# Patient Record
Sex: Female | Born: 1940 | Race: White | Hispanic: No | Smoking: Former smoker
Health system: Southern US, Community
[De-identification: ages and names within clinical notes are randomized; demographics above are authoritative.]

## PROBLEM LIST (undated history)

## (undated) DIAGNOSIS — C189 Malignant neoplasm of colon, unspecified: Secondary | ICD-10-CM

## (undated) DIAGNOSIS — M797 Fibromyalgia: Secondary | ICD-10-CM

## (undated) DIAGNOSIS — R140 Abdominal distension (gaseous): Secondary | ICD-10-CM

## (undated) DIAGNOSIS — D649 Anemia, unspecified: Secondary | ICD-10-CM

## (undated) DIAGNOSIS — M549 Dorsalgia, unspecified: Secondary | ICD-10-CM

## (undated) DIAGNOSIS — K219 Gastro-esophageal reflux disease without esophagitis: Secondary | ICD-10-CM

## (undated) DIAGNOSIS — G43909 Migraine, unspecified, not intractable, without status migrainosus: Secondary | ICD-10-CM

## (undated) DIAGNOSIS — F419 Anxiety disorder, unspecified: Secondary | ICD-10-CM

## (undated) DIAGNOSIS — R42 Dizziness and giddiness: Secondary | ICD-10-CM

## (undated) DIAGNOSIS — J189 Pneumonia, unspecified organism: Secondary | ICD-10-CM

## (undated) DIAGNOSIS — I10 Essential (primary) hypertension: Secondary | ICD-10-CM

## (undated) DIAGNOSIS — H409 Unspecified glaucoma: Secondary | ICD-10-CM

## (undated) DIAGNOSIS — R12 Heartburn: Secondary | ICD-10-CM

## (undated) DIAGNOSIS — M1990 Unspecified osteoarthritis, unspecified site: Secondary | ICD-10-CM

## (undated) DIAGNOSIS — E119 Type 2 diabetes mellitus without complications: Secondary | ICD-10-CM

## (undated) DIAGNOSIS — Z87442 Personal history of urinary calculi: Secondary | ICD-10-CM

## (undated) DIAGNOSIS — R519 Headache, unspecified: Secondary | ICD-10-CM

## (undated) DIAGNOSIS — N289 Disorder of kidney and ureter, unspecified: Secondary | ICD-10-CM

## (undated) DIAGNOSIS — G459 Transient cerebral ischemic attack, unspecified: Secondary | ICD-10-CM

## (undated) DIAGNOSIS — K3184 Gastroparesis: Secondary | ICD-10-CM

## (undated) DIAGNOSIS — R110 Nausea: Secondary | ICD-10-CM

## (undated) DIAGNOSIS — K449 Diaphragmatic hernia without obstruction or gangrene: Secondary | ICD-10-CM

## (undated) DIAGNOSIS — Z9889 Other specified postprocedural states: Secondary | ICD-10-CM

## (undated) DIAGNOSIS — J45909 Unspecified asthma, uncomplicated: Secondary | ICD-10-CM

## (undated) DIAGNOSIS — R112 Nausea with vomiting, unspecified: Secondary | ICD-10-CM

## (undated) DIAGNOSIS — G8929 Other chronic pain: Secondary | ICD-10-CM

## (undated) DIAGNOSIS — E1142 Type 2 diabetes mellitus with diabetic polyneuropathy: Secondary | ICD-10-CM

## (undated) DIAGNOSIS — Z8489 Family history of other specified conditions: Secondary | ICD-10-CM

## (undated) DIAGNOSIS — Z9289 Personal history of other medical treatment: Secondary | ICD-10-CM

## (undated) DIAGNOSIS — R51 Headache: Secondary | ICD-10-CM

## (undated) DIAGNOSIS — M51369 Other intervertebral disc degeneration, lumbar region without mention of lumbar back pain or lower extremity pain: Secondary | ICD-10-CM

## (undated) DIAGNOSIS — M542 Cervicalgia: Secondary | ICD-10-CM

## (undated) DIAGNOSIS — K254 Chronic or unspecified gastric ulcer with hemorrhage: Secondary | ICD-10-CM

## (undated) DIAGNOSIS — M5136 Other intervertebral disc degeneration, lumbar region: Secondary | ICD-10-CM

## (undated) HISTORY — PX: VAGINAL HYSTERECTOMY: SUR661

## (undated) HISTORY — PX: ABDOMINAL ADHESION SURGERY: SHX90

## (undated) HISTORY — DX: Gastro-esophageal reflux disease without esophagitis: K21.9

## (undated) HISTORY — DX: Nausea: R11.0

## (undated) HISTORY — PX: ANTERIOR CERVICAL DISCECTOMY: SHX1160

## (undated) HISTORY — DX: Abdominal distension (gaseous): R14.0

## (undated) HISTORY — PX: TONSILLECTOMY: SUR1361

## (undated) HISTORY — DX: Anemia, unspecified: D64.9

## (undated) HISTORY — PX: COLON SURGERY: SHX602

## (undated) HISTORY — DX: Gastroparesis: K31.84

---

## 1969-02-07 HISTORY — PX: HIATAL HERNIA REPAIR: SHX195

## 1979-02-08 DIAGNOSIS — K254 Chronic or unspecified gastric ulcer with hemorrhage: Secondary | ICD-10-CM

## 1979-02-08 DIAGNOSIS — Z9289 Personal history of other medical treatment: Secondary | ICD-10-CM

## 1979-02-08 HISTORY — DX: Chronic or unspecified gastric ulcer with hemorrhage: K25.4

## 1979-02-08 HISTORY — DX: Personal history of other medical treatment: Z92.89

## 1979-02-08 HISTORY — PX: CHOLECYSTECTOMY: SHX55

## 1983-06-10 HISTORY — PX: POSTERIOR FUSION CERVICAL SPINE: SUR628

## 1999-08-16 ENCOUNTER — Ambulatory Visit (HOSPITAL_COMMUNITY): Admission: RE | Admit: 1999-08-16 | Discharge: 1999-08-16 | Payer: Self-pay | Admitting: Neurological Surgery

## 1999-08-30 ENCOUNTER — Ambulatory Visit (HOSPITAL_COMMUNITY): Admission: RE | Admit: 1999-08-30 | Discharge: 1999-08-30 | Payer: Self-pay | Admitting: Neurological Surgery

## 1999-08-30 ENCOUNTER — Encounter: Payer: Self-pay | Admitting: Neurological Surgery

## 1999-10-10 ENCOUNTER — Encounter: Admission: RE | Admit: 1999-10-10 | Discharge: 1999-12-23 | Payer: Self-pay | Admitting: Anesthesiology

## 2000-01-02 ENCOUNTER — Encounter: Admission: RE | Admit: 2000-01-02 | Discharge: 2000-02-20 | Payer: Self-pay | Admitting: Anesthesiology

## 2000-04-10 ENCOUNTER — Encounter: Admission: RE | Admit: 2000-04-10 | Discharge: 2000-07-09 | Payer: Self-pay | Admitting: Anesthesiology

## 2000-07-13 ENCOUNTER — Encounter: Admission: RE | Admit: 2000-07-13 | Discharge: 2000-10-11 | Payer: Self-pay | Admitting: Anesthesiology

## 2000-09-25 ENCOUNTER — Encounter: Payer: Self-pay | Admitting: Neurology

## 2000-09-25 ENCOUNTER — Ambulatory Visit (HOSPITAL_COMMUNITY): Admission: RE | Admit: 2000-09-25 | Discharge: 2000-09-25 | Payer: Self-pay | Admitting: Neurology

## 2000-11-14 ENCOUNTER — Emergency Department (HOSPITAL_COMMUNITY): Admission: EM | Admit: 2000-11-14 | Discharge: 2000-11-14 | Payer: Self-pay | Admitting: Internal Medicine

## 2000-11-14 ENCOUNTER — Encounter: Payer: Self-pay | Admitting: Internal Medicine

## 2000-12-28 ENCOUNTER — Ambulatory Visit (HOSPITAL_COMMUNITY): Admission: RE | Admit: 2000-12-28 | Discharge: 2000-12-28 | Payer: Self-pay | Admitting: Internal Medicine

## 2000-12-28 HISTORY — PX: COLONOSCOPY: SHX174

## 2001-04-15 ENCOUNTER — Ambulatory Visit (HOSPITAL_COMMUNITY): Admission: RE | Admit: 2001-04-15 | Discharge: 2001-04-15 | Payer: Self-pay | Admitting: Neurology

## 2001-04-15 ENCOUNTER — Encounter: Payer: Self-pay | Admitting: Neurology

## 2001-06-17 ENCOUNTER — Encounter: Admission: RE | Admit: 2001-06-17 | Discharge: 2001-09-15 | Payer: Self-pay | Admitting: Family Medicine

## 2001-07-14 ENCOUNTER — Ambulatory Visit (HOSPITAL_COMMUNITY): Admission: RE | Admit: 2001-07-14 | Discharge: 2001-07-14 | Payer: Self-pay | Admitting: Internal Medicine

## 2001-08-11 HISTORY — PX: UPPER GASTROINTESTINAL ENDOSCOPY: SHX188

## 2002-03-28 ENCOUNTER — Encounter: Payer: Self-pay | Admitting: Family Medicine

## 2002-03-28 ENCOUNTER — Ambulatory Visit (HOSPITAL_COMMUNITY): Admission: RE | Admit: 2002-03-28 | Discharge: 2002-03-28 | Payer: Self-pay | Admitting: Family Medicine

## 2003-02-09 ENCOUNTER — Emergency Department (HOSPITAL_COMMUNITY): Admission: EM | Admit: 2003-02-09 | Discharge: 2003-02-10 | Payer: Self-pay | Admitting: *Deleted

## 2003-02-10 ENCOUNTER — Encounter: Payer: Self-pay | Admitting: *Deleted

## 2003-02-10 ENCOUNTER — Emergency Department (HOSPITAL_COMMUNITY): Admission: EM | Admit: 2003-02-10 | Discharge: 2003-02-11 | Payer: Self-pay | Admitting: Emergency Medicine

## 2004-01-29 ENCOUNTER — Emergency Department (HOSPITAL_COMMUNITY): Admission: EM | Admit: 2004-01-29 | Discharge: 2004-01-29 | Payer: Self-pay | Admitting: Emergency Medicine

## 2004-11-20 ENCOUNTER — Ambulatory Visit (HOSPITAL_COMMUNITY): Admission: RE | Admit: 2004-11-20 | Discharge: 2004-11-20 | Payer: Self-pay | Admitting: Family Medicine

## 2005-02-01 ENCOUNTER — Emergency Department (HOSPITAL_COMMUNITY): Admission: EM | Admit: 2005-02-01 | Discharge: 2005-02-02 | Payer: Self-pay | Admitting: *Deleted

## 2005-02-05 ENCOUNTER — Ambulatory Visit (HOSPITAL_COMMUNITY): Admission: RE | Admit: 2005-02-05 | Discharge: 2005-02-05 | Payer: Self-pay | Admitting: Family Medicine

## 2005-05-31 ENCOUNTER — Emergency Department (HOSPITAL_COMMUNITY): Admission: EM | Admit: 2005-05-31 | Discharge: 2005-05-31 | Payer: Self-pay | Admitting: Emergency Medicine

## 2006-02-18 ENCOUNTER — Ambulatory Visit (HOSPITAL_COMMUNITY): Admission: RE | Admit: 2006-02-18 | Discharge: 2006-02-18 | Payer: Self-pay | Admitting: Family Medicine

## 2006-06-11 ENCOUNTER — Ambulatory Visit: Payer: Self-pay | Admitting: Internal Medicine

## 2007-01-18 ENCOUNTER — Ambulatory Visit (HOSPITAL_COMMUNITY): Admission: RE | Admit: 2007-01-18 | Discharge: 2007-01-18 | Payer: Self-pay | Admitting: Family Medicine

## 2007-01-21 ENCOUNTER — Ambulatory Visit (HOSPITAL_COMMUNITY): Admission: RE | Admit: 2007-01-21 | Discharge: 2007-01-21 | Payer: Self-pay | Admitting: Family Medicine

## 2007-01-26 ENCOUNTER — Ambulatory Visit (HOSPITAL_COMMUNITY): Admission: RE | Admit: 2007-01-26 | Discharge: 2007-01-26 | Payer: Self-pay | Admitting: Urology

## 2007-05-10 HISTORY — PX: COLONOSCOPY: SHX174

## 2007-05-10 HISTORY — PX: UPPER GASTROINTESTINAL ENDOSCOPY: SHX188

## 2007-06-28 HISTORY — PX: COLONOSCOPY: SHX174

## 2007-07-12 ENCOUNTER — Ambulatory Visit (HOSPITAL_COMMUNITY): Admission: RE | Admit: 2007-07-12 | Discharge: 2007-07-12 | Payer: Self-pay | Admitting: Family Medicine

## 2008-01-19 ENCOUNTER — Ambulatory Visit (HOSPITAL_COMMUNITY): Admission: RE | Admit: 2008-01-19 | Discharge: 2008-01-19 | Payer: Self-pay | Admitting: Family Medicine

## 2008-01-20 ENCOUNTER — Ambulatory Visit (HOSPITAL_COMMUNITY): Admission: RE | Admit: 2008-01-20 | Discharge: 2008-01-20 | Payer: Self-pay | Admitting: Family Medicine

## 2008-01-20 ENCOUNTER — Encounter: Payer: Self-pay | Admitting: Orthopedic Surgery

## 2008-02-07 ENCOUNTER — Ambulatory Visit: Payer: Self-pay | Admitting: Orthopedic Surgery

## 2008-02-07 DIAGNOSIS — M479 Spondylosis, unspecified: Secondary | ICD-10-CM | POA: Insufficient documentation

## 2008-02-07 DIAGNOSIS — M4800 Spinal stenosis, site unspecified: Secondary | ICD-10-CM | POA: Insufficient documentation

## 2008-02-07 DIAGNOSIS — Q762 Congenital spondylolisthesis: Secondary | ICD-10-CM | POA: Insufficient documentation

## 2008-02-08 ENCOUNTER — Encounter: Payer: Self-pay | Admitting: Orthopedic Surgery

## 2008-02-11 ENCOUNTER — Ambulatory Visit (HOSPITAL_COMMUNITY): Admission: RE | Admit: 2008-02-11 | Discharge: 2008-02-11 | Payer: Self-pay | Admitting: Orthopedic Surgery

## 2008-02-22 ENCOUNTER — Telehealth: Payer: Self-pay | Admitting: Orthopedic Surgery

## 2008-03-09 ENCOUNTER — Telehealth: Payer: Self-pay | Admitting: Orthopedic Surgery

## 2008-03-21 ENCOUNTER — Telehealth: Payer: Self-pay | Admitting: Orthopedic Surgery

## 2008-03-24 ENCOUNTER — Encounter: Payer: Self-pay | Admitting: Orthopedic Surgery

## 2008-03-29 ENCOUNTER — Encounter (HOSPITAL_COMMUNITY): Admission: RE | Admit: 2008-03-29 | Discharge: 2008-04-21 | Payer: Self-pay | Admitting: Neurosurgery

## 2008-05-01 ENCOUNTER — Ambulatory Visit (HOSPITAL_COMMUNITY): Admission: RE | Admit: 2008-05-01 | Discharge: 2008-05-01 | Payer: Self-pay | Admitting: Neurosurgery

## 2008-06-09 HISTORY — PX: HEMICOLECTOMY: SHX854

## 2008-06-26 ENCOUNTER — Encounter (HOSPITAL_COMMUNITY): Admission: RE | Admit: 2008-06-26 | Discharge: 2008-07-26 | Payer: Self-pay | Admitting: Neurosurgery

## 2008-06-26 ENCOUNTER — Ambulatory Visit (HOSPITAL_COMMUNITY): Admission: RE | Admit: 2008-06-26 | Discharge: 2008-06-26 | Payer: Self-pay | Admitting: Urology

## 2008-07-25 ENCOUNTER — Encounter: Payer: Self-pay | Admitting: Orthopedic Surgery

## 2008-07-28 HISTORY — PX: COLONOSCOPY: SHX174

## 2008-08-11 ENCOUNTER — Ambulatory Visit (HOSPITAL_COMMUNITY): Admission: RE | Admit: 2008-08-11 | Discharge: 2008-08-11 | Payer: Self-pay | Admitting: General Surgery

## 2008-08-17 ENCOUNTER — Ambulatory Visit: Payer: Self-pay | Admitting: Cardiology

## 2008-08-17 ENCOUNTER — Ambulatory Visit (HOSPITAL_COMMUNITY): Admission: RE | Admit: 2008-08-17 | Discharge: 2008-08-17 | Payer: Self-pay | Admitting: Family Medicine

## 2008-08-17 ENCOUNTER — Encounter (INDEPENDENT_AMBULATORY_CARE_PROVIDER_SITE_OTHER): Payer: Self-pay | Admitting: Family Medicine

## 2008-08-18 ENCOUNTER — Inpatient Hospital Stay (HOSPITAL_COMMUNITY): Admission: RE | Admit: 2008-08-18 | Discharge: 2008-08-22 | Payer: Self-pay | Admitting: General Surgery

## 2008-08-18 ENCOUNTER — Encounter (INDEPENDENT_AMBULATORY_CARE_PROVIDER_SITE_OTHER): Payer: Self-pay | Admitting: General Surgery

## 2008-11-10 ENCOUNTER — Inpatient Hospital Stay (HOSPITAL_COMMUNITY): Admission: RE | Admit: 2008-11-10 | Discharge: 2008-11-11 | Payer: Self-pay | Admitting: Neurosurgery

## 2009-01-09 ENCOUNTER — Encounter: Admission: RE | Admit: 2009-01-09 | Discharge: 2009-01-09 | Payer: Self-pay | Admitting: Neurosurgery

## 2009-04-19 ENCOUNTER — Encounter: Admission: RE | Admit: 2009-04-19 | Discharge: 2009-04-19 | Payer: Self-pay | Admitting: Neurosurgery

## 2009-05-08 ENCOUNTER — Encounter: Admission: RE | Admit: 2009-05-08 | Discharge: 2009-05-08 | Payer: Self-pay | Admitting: Neurosurgery

## 2009-09-25 ENCOUNTER — Ambulatory Visit (HOSPITAL_COMMUNITY): Admission: RE | Admit: 2009-09-25 | Discharge: 2009-09-25 | Payer: Self-pay | Admitting: Family Medicine

## 2009-11-23 HISTORY — PX: COLONOSCOPY: SHX174

## 2009-11-23 HISTORY — PX: UPPER GASTROINTESTINAL ENDOSCOPY: SHX188

## 2010-03-05 ENCOUNTER — Emergency Department (HOSPITAL_COMMUNITY): Admission: EM | Admit: 2010-03-05 | Discharge: 2010-03-05 | Payer: Self-pay | Admitting: Emergency Medicine

## 2010-07-01 LAB — CBC AND DIFFERENTIAL
HCT: 39 % (ref 36–46)
Hemoglobin: 11.9 g/dL — AB (ref 12.0–16.0)
Platelets: 221 10*3/uL (ref 150–399)

## 2010-07-01 LAB — BASIC METABOLIC PANEL
Glucose: 107 mg/dL
Potassium: 4.2 mmol/L (ref 3.4–5.3)
Sodium: 139 mmol/L (ref 137–147)

## 2010-07-01 LAB — HEPATIC FUNCTION PANEL
ALT: 10 U/L (ref 7–35)
AST: 15 U/L (ref 13–35)

## 2010-07-09 NOTE — Letter (Signed)
Summary: External Correspondence  External Correspondence   Imported By: Elvera Maria 08/04/2008 09:12:07  _____________________________________________________________________  External Attachment:    Type:   Image     Comment:   [facility]

## 2010-07-09 NOTE — Progress Notes (Signed)
Summary: Neurosurgeon referral.  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

## 2010-07-09 NOTE — Medication Information (Signed)
Summary: RX Folder  RX Folder   Imported By: Jacklynn Ganong 02/08/2008 11:45:56  _____________________________________________________________________  External Attachment:    Type:   Image     Comment:   updated meds

## 2010-07-09 NOTE — Letter (Signed)
Summary: Historic Patient File  Historic Patient File   Imported By: Jacklynn Ganong 02/08/2008 11:45:03  _____________________________________________________________________  External Attachment:    Type:   Image     Comment:   history form

## 2010-07-09 NOTE — Letter (Signed)
Summary: Consultant Letter  All     ,     Phone:   Fax:     02/08/2008  Dear Dr.Knowlton:  I am writing to report my evaluation of [name], a 70 Years Old Female on whom I consulted 02/07/2008 at . When I saw the patient the Chief Complaint was "right hip pain".     The patient presents with right-sided "hip pain" which is radiating to her right knee down the lateral portion of her right thigh. She denies any injury. She has had 2 episodes of lower back pain which were treated with prednisone which she says did not help. She takes Tylox for neck pain which is chronic and this relieves part of her hip pain. The pain also radiates up into her back. It is associated with numbness. She denies MRI evaluation or previous surgery at this time.  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 My assessment at that time was as follows:  She has a significant spinal malalignment and spondylolisthesis. The spondylosis and spinal stenosis most likely accounts for her right leg pain.  We are referring her to a neurosurgeon for further evaluation.  I will send copies of the results of the above investigations as they become available to your office if you wish. The most recent office note and chart summary are attached. Thank you for the opportunity to participate in the care of [name].  Yours truly,   Fuller Canada MD

## 2010-07-09 NOTE — Progress Notes (Signed)
Summary: New neurosurgeon referral.  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 I sent the referral on 03-09-08.

## 2010-07-09 NOTE — Progress Notes (Signed)
Summary: Dr. Wynetta Emery appointment.  Phone Note Outgoing Call   Call placed by: Waldon Reining,  March 21, 2008 9:33 AM Call placed to: Patient Action Taken: Phone Call Completed, Appt scheduled Summary of Call: I called to give patient her appointment with Dr. Wynetta Emery on 03-24-08 at 1:45 at the [facility] office. I left a message for the patient.

## 2010-07-09 NOTE — Consult Note (Signed)
Summary: Consult Report [facility] Dr Wynetta Emery  Consult Report [facility] Dr Wynetta Emery   Imported By: Cammie Sickle 04/17/2008 17:33:49  _____________________________________________________________________  External Attachment:    Type:   Image     Comment:   External Document

## 2010-07-09 NOTE — Assessment & Plan Note (Signed)
Summary: RT HIP PAIN,REF [facility]LTON/XR+MRI @[facility] /?NEW XRAY?MEDICARE/CAF   Vital Signs:  Patient Profile:   70 Years Old Female Weight:      141 pounds Pulse rate:   72 / minute Resp:     16 per minute  Vitals Entered By: Fuller Canada MD (February 07, 2008 10:33 AM)                 Chief Complaint:  right hip pain.  History of Present Illness: I saw [name] in the office today for an initial visit.  She is a 70 years old woman with the complaint of:  right hip pain, consult Knowlton.  The patient presents with right-sided "hip pain" which is radiating to her right knee down the lateral portion of her right thigh. She denies any injury. She has had 2 episodes of lower back pain which were treated with prednisone which she says did not help. She takes Tylox for neck pain which is chronic and this relieves part of her hip pain. The pain also radiates up into her back. It is associated with numbness. She denies MRI evaluation her previous surgery at this time.          Updated Prior Medication List: medications have been scanned due to the number. Current Allergies (reviewed today): ! MORPHINE ! CODEINE ! PENICILLIN  Past Medical History:    diabetes    glaucoma    reflux    cataracts    htn  Past Surgical History:    gallbladder    hernia    hysterectomy    neck/was broken   Family History:    Family History of Diabetes    Family History Coronary Heart Disease female < 13    Family History of Arthritis    Hx, family, asthma    Hx, family, kidney disease NEC  Social History:    Patient is widowed.     retired   Risk Factors:  Tobacco use:  never Caffeine use:  1 drinks per day Alcohol use:  no  Family History Risk Factors:    Family History of MI in males < 57 years old:  yes   Review of Systems  General      Denies weight loss, weight gain, fever, chills, and fatigue.  Cardiac      Denies chest pain, angina, heart attack, heart failure,  poor circulation, blood clots, and phlebitis.  Resp      Denies short of breath, difficulty breathing, COPD, cough, and pneumonia.  GI      Complains of reflux.      Denies nausea, vomiting, diarrhea, constipation, difficulty swallowing, ulcers, and GERD.  GU      Denies kidney failure, kidney transplant, kidney stones, burning, poor stream, testicular cancer, blood in urine, and .  Neuro      Complains of headache, migraines, and tremor.      Denies dizziness, numbness, weakness, and unsteady walking.      tremor around mouth  MS      Complains of joint pain, rheumatoid arthritis, joint swelling, and osteoporosis.      Denies gout, bone cancer, and .  Endo      Complains of diabetes.      Denies thyroid disease and goiter.  Psych      Complains of depression and anxiety.      Denies mood swings, panic attack, bipolar, and schizophrenia.  Derm      Denies eczema, cancer, and itching.  EENT      Complains of poor vision, cataracts, glaucoma, poor hearing, vertigo, ears ringing, and toothaches.      Denies sinusitis, hoarseness, and bleeding gums.  Immunology      Complains of seasonal allergies and sinus problems.      Denies allergic to bee stings.  Lymphatic      Denies lymph node cancer and lymph edema.   Physical Exam  Constitutional: vital signs see recorded values. General: normal development, nutrition, and grooming. No deformity. Body Habitus is endomorphic CDV: Observation and palpation was normal  Lymph: palpation of the lymph nodes were normal Skin: inspection and palpation of the skin revealed no abnormalities  Neuro: coordination: normal              DTR's normal              Sensation was normal  Psyche: Alert and oriented x 3. Mood was normal.  Affect: normal  MSK: Gait: normal   Lumbar: Mild tenderness right paraspinal region, right gluteal region. Coronal and sagittal alignment appear normal to gross inspection.  Upper extremities: Inspection  was normal. Assessment of range of motion, strength, stability were normal.  Lower extremities: Inspection was normal. Assessment of range of motion and strength stability were normal in the hips, knees and ankles.  The straight leg raise maneuver produces no symptoms.      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 There is grade 3 spondylolisthesis at L5-S1 with complete obliteration of the L5-S1 disc space, there are supple degenerative changes at L2 and 3 and L3 and 4. There is coronal plane malalignment as well.  Impression spondylolisthesis with degenerative disc disease consistent with associated spondylosis and most likely spinal stenosis.  The x-rays from the hospital included MRI of the hip and hip x-rays which show mild degenerative changes nothing requiring any further treatment especially without groin pain or anterior thigh pain.  I recommended that she have neurosurgical evaluation to treat this.    Patient Instructions: 1)  Neurosurgeon referral.   ]

## 2010-07-23 ENCOUNTER — Ambulatory Visit (INDEPENDENT_AMBULATORY_CARE_PROVIDER_SITE_OTHER): Payer: Medicare Other | Admitting: Internal Medicine

## 2010-07-23 DIAGNOSIS — K3184 Gastroparesis: Secondary | ICD-10-CM

## 2010-07-23 DIAGNOSIS — R112 Nausea with vomiting, unspecified: Secondary | ICD-10-CM

## 2010-07-28 NOTE — Consult Note (Signed)
[name]               ACCOUNT NO.:  [id number]  MEDICAL RECORD NO.:  [id number]          PATIENT TYPE:  OPT  LOCATION: [place]                   FACILITY:  [facility]  PHYSICIAN:  Lionel December, M.D.    DATE OF BIRTH:  1941-06-01  DATE OF CONSULTATION:  07/23/2010 DATE OF DISCHARGE:  03/05/2010                                OFFICE VISIT.   PRESENTING COMPLAINT:  Nausea, bloating, regurgitation and heartburn.  The patient with known diabetic gastroparesis.  Last visit 6 months ago in [place].  SUBJECTIVE:  [name] is a 70 year old Caucasian female patient of Dr. Gareth Morgan, who is here for scheduled visit.  She states she is not feeling well.  She has multiple episodes of nausea every day, but has not had any vomiting recently.  She also complains of regurgitation after most of the meals and in between.  She is also having multiple episodes of heartburn.  In addition to her usual medication, she is also taking Mylanta 4 times a day.  Her appetite is fair.  However, she has not lost any weight recently.  She says her bowels move regularly.  She denies melena or rectal bleeding.  She has multiple other complaints. She complains of constant pain in her head, face, neck and shoulders. She also states that she brush the teeth all the time.  She feels nervous.  She states she has had nervousness off and on all of her life and not necessarily any worse, since she was on metoclopramide.  She states that she is on a bland diet.  However, this morning, she had egg biscuit from [employer] and last night she had a cheeseburger.  She does not do any exercise on regular basis, but she states that she is busy with 2 of her great grand children ages 36 months and 75-1/2 or 58 years old.  She denies dysphagia, odynophagia or chronic cough or hoarseness. She also denies fever, chills.  CURRENT MEDICATIONS: 1. Metformin 1 g p.o. b.i.d. 2. Lipitor 10 mg p.o. daily. 3. Evista 60 mg  daily. 4. Metoclopramide 20 mg before breakfast and lunch and 15 mg before     evening meal and at bedtime. 5. Lansoprazole 30 mg p.o. b.i.d. 6. Cymbalta 60 mg daily. 7. Enalapril 5 mg daily. 8. Plavix 75 mg daily. 9. Temazepam 30 mg p.o. nightly. 10.Topamax 100 mg p.o. nightly. 11.Baclofen 10 mg p.o. b.i.d. 12.Oxycodone/APAP 10/325 q.i.d. p.r.n. 13.Flaxseed 1 g daily. 14.Vitamin D 1000 units daily. 15.Calcium and vitamin D daily. 16.Latanoprost eyedrops 0.005% each eye at bedtime. 17.Mylanta 2 tablespoonful q.i.d.  Her GI history is significant for diabetic gastroparesis.  She also has history of iron-deficiency anemia possibly related to impaired iron absorption due to chronic acid suppression.  She has history of colonic polyps and carcinoma.  She had right hemicolectomy by Dr. Leticia Penna in March 2010.  She had an EGD and colonoscopy at [facility] in June 2012.  She had small sliding hiatal hernia, otherwise normal EGD and she had a pancolonic diverticulosis and a patent ileocolonic anastomosis and hemorrhoids.  For other medical problems, please refer to my last note from [place].  ALLERGIES:  To MORPHINE,  PENTASA and CODEINE.  OBJECTIVE:  VITAL SIGNS:  Weight 140, 4-1/2 pounds, she is 61 inches tall, pulse 70 per minute, blood pressure 118/74 and temp is 97.4. GENERAL:  She appears anxious, but she does not have any abnormal movement to her tongue or lips. EYES:  Conjunctivae are pink.  Sclerae are nonicteric. MOUTH:  Oropharyngeal mucosa is normal. NECK:  No neck masses are noted. LUNGS:  Clear to auscultation. ABDOMEN:  Symmetrical.  Bowel sounds are normal.  No bruits noted.  On palpation, soft abdomen with mild midepigastric tenderness, no organomegaly or masses noted.  ASSESSMENT: 1. Myanna has persistent symptoms of gastroesophageal reflux disease and     gastroparesis.  She is on fairly high dose of metoclopramide total     dose 70 mg per day.  Luckily, she is not having  any side effects.     However, it does not appear to be helping her symptoms.  She     definitely is not very compliant with a diet given what she had     this morning and last evening.  She did have solid phase gastric     emptying study last year at [facility] while on therapy, but I do not have     results of those.  She already has a gallbladder removed.  She     states she had lab studies by Dr. Sudie Bailey recently, but does not     know the results.  We need to get the results before any decisions     made. 2. She may be a candidate for therapy with erythromycin.  She has     failed domperidone in the past.  Other option would be referral to     [facility] to see if she would be a candidate for gastric     electrical stimulation. 3. It is quite possible that her symptoms are just due to chronic     dyspepsia given all the medication that she is on.  PLAN: 1. The patient advised to drop evening dose of metoclopramide, so she     will be taking 55 mg per day. 2. The patient was advised to cut back on use of Mylanta p.r.n. 3. She needs to be on bland diet.  We will provide her with written     instructions.  We will request copy of recent blood work from Dr. Michelle Nasuti office and her records from [place] before further recommendations are made.     Lionel December, M.D.     NR/MEDQ  D:  07/23/2010  T:  07/24/2010  Job:  [id number]  cc:   Mila Homer. Sudie Bailey, M.D. Fax: [phone]  Electronically Signed by Lionel December M.D. on 07/28/2010 01:56:53 PM

## 2010-08-11 ENCOUNTER — Ambulatory Visit: Payer: PRIVATE HEALTH INSURANCE | Admitting: Family Medicine

## 2010-08-11 ENCOUNTER — Encounter: Payer: Self-pay | Admitting: Family Medicine

## 2010-08-11 DIAGNOSIS — R5381 Other malaise: Secondary | ICD-10-CM

## 2010-08-11 DIAGNOSIS — J309 Allergic rhinitis, unspecified: Secondary | ICD-10-CM

## 2010-08-11 DIAGNOSIS — R5383 Other fatigue: Secondary | ICD-10-CM

## 2010-08-11 DIAGNOSIS — J45909 Unspecified asthma, uncomplicated: Secondary | ICD-10-CM

## 2010-08-11 DIAGNOSIS — J209 Acute bronchitis, unspecified: Secondary | ICD-10-CM

## 2010-08-20 NOTE — Assessment & Plan Note (Signed)
Summary: cold/flu like symptoms/jbb   Vital Signs:  Patient Profile:   70 Years Old Female CC:      Flu like symptoms x 1 week Height:     60.75 inches Weight:      141 pounds Temp:     99.1 degrees F oral Pulse rate:   71 / minute Pulse rhythm:   regular Resp:     16 per minute BP sitting:   152 / 84  (right arm) Cuff size:   regular  Pt. in pain?   no  Vitals Entered By: Providence Crosby LPN (August 11, 6043 2:20 PM)                   Current Allergies (reviewed today): ! MORPHINE ! CODEINE ! PENICILLINHistory of Present Illness History from: patient Chief Complaint: Flu like symptoms x 1 week History of Present Illness: chills x 1 week ear pain: frequent runny nose: She is having chest congestion and congestion and coughing and sneezing.   She is having itching watery eyes and rubbing eyes frequently.  She reports history of headaches and sinus pressure;  She does have a history of pollen allergies but hasn't been taking her benadryl medication recently.  She says that she is taking Nasonex Nasal Spray that was recently prescribed to her by her PCP.  She is scheduled to see her PCP next week for a follow up appointment.  She is coughing and wheezing but mostly at night.  No SOB reported but chest discomfort A/W coughing.  No n/v/d.   REVIEW OF SYSTEMS Constitutional Symptoms       Complains of fever, chills, and fatigue.     Denies night sweats, weight loss, and weight gain.  Eyes       Denies change in vision, eye pain, eye discharge, glasses, contact lenses, and eye surgery. Ear/Nose/Throat/Mouth       Complains of ear pain, frequent runny nose, sinus problems, and hoarseness.      Denies hearing loss/aids, change in hearing, ear discharge, dizziness, frequent nose bleeds, sore throat, and tooth pain or bleeding.  Respiratory       Complains of dry cough.      Denies productive cough, wheezing, shortness of breath, asthma, bronchitis, and emphysema/COPD.  Cardiovascular   Complains of chest pain and tires easily with exhertion.      Denies murmurs.    Gastrointestinal       Denies stomach pain, nausea/vomiting, diarrhea, constipation, blood in bowel movements, and indigestion. Genitourniary       Denies painful urination, kidney stones, and loss of urinary control. Neurological       Complains of headaches.      Denies paralysis, seizures, and fainting/blackouts. Musculoskeletal       Denies muscle pain, joint pain, joint stiffness, decreased range of motion, redness, swelling, muscle weakness, and gout.  Skin       Denies bruising, unusual mles/lumps or sores, and hair/skin or nail changes.  Psych       Denies mood changes, temper/anger issues, anxiety/stress, speech problems, depression, and sleep problems.  Past History:  Family History: Last updated: 02/07/2008 Family History of Diabetes Family History Coronary Heart Disease female < 2 Family History of Arthritis Hx, family, asthma Hx, family, kidney disease NEC  Social History: Last updated: 08/11/2010 Patient is widowed.  retired No Tobacco ETOH or rec drugs reported;   Risk Factors: Caffeine Use: 1 (02/07/2008)  Risk Factors: Smoking Status: never (08/11/2010)  Past Medical History: diabetes mellitus, type 2 glaucoma GERD GAD Chronic Insomnia cataracts HTN  Past Surgical History: Reviewed history from 02/07/2008 and no changes required. gallbladder hernia hysterectomy neck/was broken  Family History: Reviewed history from 02/07/2008 and no changes required. Family History of Diabetes Family History Coronary Heart Disease female < 67 Family History of Arthritis Hx, family, asthma Hx, family, kidney disease NEC  Social History: Patient is widowed.  retired No Tobacco ETOH or rec drugs reported;  Physical Exam General appearance: well developed, well nourished, no acute distress Head: normocephalic, atraumatic Eyes: conjunctivae and lids normal Pupils: equal,  round, reactive to light Ears: normal, no lesions or deformities Nasal: pale, boggy, swollen nasal turbinates, clear rhinorrhea Oral/Pharynx: tongue normal, posterior pharynx without erythema or exudate Neck: neck supple,  trachea midline, no masses Chest/Lungs: anterior upper airway congestion noises; no rales, wheezes, or rhonchi bilateral, breath sounds equal without effort Heart: regular rate and  rhythm, no murmur Abdomen: soft, non-tender without obvious organomegaly Extremities: normal extremities Neurological: normal gait Skin: no obvious rashes or lesions MSE: oriented to time, place, and person Assessment New Problems: BRONCHITIS, ALLERGIC (ICD-493.90) FATIGUE, ACUTE (ICD-780.79) POSTNASAL DRIP SYNDROME, 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  The patient verbalized clear understanding.  The patient was given instructions to return if symptoms don't improve, worsen or new changes develop.  If it is not during clinic hours and the patient cannot get back to this clinic then the patient was told to seek medical care at an available urgent care or emergency department.  The patient verbalized understanding.   Demonstrates willingness to comply.  Plan New Medications/Changes: VENTOLIN HFA 108 (90 BASE) MCG/ACT AERS (ALBUTEROL SULFATE) 2 puffs every 4 hours as needed cough, wheezing, SOB  #1 x 0, 08/11/2010, Wanda Combs LPN CETIRIZINE HCL 10 MG TABS (CETIRIZINE HCL) take 1 by mouth daily for allergies  #30 x 1, 08/11/2010, Providence Crosby LPN  Follow Up: Follow up in 2-3 days if no improvement, Follow up on an as needed basis, Follow up with Primary Physician  The patient and/or caregiver has been counseled thoroughly with regard to medications prescribed including dosage, schedule, interactions, rationale for use, and possible side  effects and they verbalize understanding.  Diagnoses and expected course of recovery discussed and will return if not improved as expected or if the condition worsens. Patient and/or caregiver verbalized understanding.  Prescriptions: VENTOLIN HFA 108 (90 BASE) MCG/ACT AERS (ALBUTEROL SULFATE) 2 puffs every 4 hours as needed cough, wheezing, SOB  #1 x 0   Entered by:   Providence Crosby LPN   Authorized by:   Standley Dakins MD   Signed by:   Providence Crosby LPN on 14/78/2956   Method used:   Electronically to        [facility] [address] (retail)       [address], [address]       [place], [place]  [address]       Ph: [phone]       Fax: [phone]   RxID:   [id number] CETIRIZINE HCL 10 MG TABS (CETIRIZINE HCL) take 1 by mouth daily for allergies  #30 x 1   Entered by:   Providence Crosby LPN   Authorized by:   Standley Dakins MD   Signed by:   Providence Crosby LPN on  08/11/2010   Method used:   Electronically to        [facility] [address] (retail)       [address]       [place], [place]  [address]       Ph: [phone]       Fax: [phone]   RxID:   [id number]   Patient Instructions: 1)  Go to the pharmacy and pick up your prescription (s).  It may take up to 30 mins for electronic prescriptions to be delivered to the pharmacy.  Please call if your pharmacy has not received your prescriptions after 30 minutes.   2)  Return or go to the ER if no improvement or symptoms getting worse.   3)  Start your new allergy medications.    4)  See your primary care physician next week.  5)  The patient was informed that there is no on-call provider or services available at this clinic during off-hours (when the clinic is closed).  If the patient developed a problem or concern that required immediate attention, the patient was advised to go the the nearest available urgent care or emergency department for medical care.   The patient verbalized understanding.    6)  Check your blood sugars regularly. If your readings are usually above : 200 or below 70 you should contact our office. 7)  It is important that your Diabetic A1c level is checked every 3 months. 8)  Check your Blood Pressure regularly. If it is above: 140/90 you should make an appointment.    Preventive Screening-Counseling & Management  Alcohol-Tobacco     Smoking Status: never   Appended Document: cold/flu like symptoms/jbb Pt reported that she had a flu vaccine this season.  Flu Test - negative.  Rodney Langton, MD, CDE, [employer]

## 2010-08-22 LAB — CREATININE, SERUM
Creatinine, Ser: 0.66 mg/dL (ref 0.4–1.2)
GFR calc Af Amer: >60 mL/min (ref >60)
GFR calc non Af Amer: >60 mL/min (ref >60)

## 2010-09-05 NOTE — Letter (Signed)
Summary: history form  history form   Imported By: Eugenio Hoes 08/26/2010 14:46:50  _____________________________________________________________________  External Attachment:    Type:   Image     Comment:   External Document

## 2010-09-05 NOTE — Medication Information (Signed)
Summary: medication  medication   Imported By: Eugenio Hoes 08/26/2010 14:48:42  _____________________________________________________________________  External Attachment:    Type:   Image     Comment:   External Document

## 2010-09-16 LAB — GLUCOSE, CAPILLARY
Glucose-Capillary: 114 mg/dL — ABNORMAL HIGH (ref 70–99)
Glucose-Capillary: 148 mg/dL — ABNORMAL HIGH (ref 70–99)
Glucose-Capillary: 148 mg/dL — ABNORMAL HIGH (ref 70–99)
Glucose-Capillary: 162 mg/dL — ABNORMAL HIGH (ref 70–99)

## 2010-09-16 LAB — CBC
HCT: 32.9 % — ABNORMAL LOW (ref 36.0–46.0)
Hemoglobin: 10.7 g/dL — ABNORMAL LOW (ref 12.0–15.0)
MCHC: 32.4 g/dL (ref 30.0–36.0)
MCV: 86.5 fL (ref 78.0–100.0)
Platelets: 200 10*3/uL (ref 150–400)
RBC: 3.81 MIL/uL — ABNORMAL LOW (ref 3.87–5.11)
RDW: 14.8 % (ref 11.5–15.5)
WBC: 7.2 10*3/uL (ref 4.0–10.5)

## 2010-09-16 LAB — BASIC METABOLIC PANEL
BUN: 11 mg/dL (ref 6–23)
CO2: 24 mEq/L (ref 19–32)
Calcium: 9.6 mg/dL (ref 8.4–10.5)
Chloride: 110 mEq/L (ref 96–112)
Creatinine, Ser: 0.67 mg/dL (ref 0.4–1.2)
GFR calc Af Amer: >60 mL/min (ref >60)
GFR calc non Af Amer: >60 mL/min (ref >60)
Glucose, Bld: 200 mg/dL — ABNORMAL HIGH (ref 70–99)
Potassium: 3.8 mEq/L (ref 3.5–5.1)
Sodium: 142 mEq/L (ref 135–145)

## 2010-09-16 LAB — APTT: aPTT: 28 seconds (ref 24–37)

## 2010-09-16 LAB — PROTIME-INR
INR: 0.9 (ref 0.00–1.49)
Prothrombin Time: 12 seconds (ref 11.6–15.2)

## 2010-09-19 LAB — BASIC METABOLIC PANEL
BUN: 9 mg/dL (ref 6–23)
CO2: 22 mEq/L (ref 19–32)
Calcium: 8.9 mg/dL (ref 8.4–10.5)
Chloride: 103 mEq/L (ref 96–112)
Creatinine, Ser: 0.76 mg/dL (ref 0.4–1.2)
GFR calc Af Amer: >60 mL/min (ref >60)
GFR calc non Af Amer: >60 mL/min (ref >60)
Glucose, Bld: 255 mg/dL — ABNORMAL HIGH (ref 70–99)
Potassium: 3.7 mEq/L (ref 3.5–5.1)
Sodium: 136 mEq/L (ref 135–145)

## 2010-09-19 LAB — COMPREHENSIVE METABOLIC PANEL
ALT: 17 U/L (ref 0–35)
ALT: 16 U/L (ref 0–35)
ALT: 16 U/L (ref 0–35)
ALT: 17 U/L (ref 0–35)
AST: 19 U/L (ref 0–37)
AST: 16 U/L (ref 0–37)
AST: 17 U/L (ref 0–37)
AST: 19 U/L (ref 0–37)
Albumin: 2.7 g/dL — ABNORMAL LOW (ref 3.5–5.2)
Albumin: 2.4 g/dL — ABNORMAL LOW (ref 3.5–5.2)
Albumin: 2.6 g/dL — ABNORMAL LOW (ref 3.5–5.2)
Albumin: 2.7 g/dL — ABNORMAL LOW (ref 3.5–5.2)
Alkaline Phosphatase: 46 U/L (ref 39–117)
Alkaline Phosphatase: 35 U/L — ABNORMAL LOW (ref 39–117)
Alkaline Phosphatase: 43 U/L (ref 39–117)
Alkaline Phosphatase: 49 U/L (ref 39–117)
BUN: 4 mg/dL — ABNORMAL LOW (ref 6–23)
BUN: 2 mg/dL — ABNORMAL LOW (ref 6–23)
BUN: 3 mg/dL — ABNORMAL LOW (ref 6–23)
BUN: 6 mg/dL (ref 6–23)
CO2: 26 mEq/L (ref 19–32)
CO2: 25 mEq/L (ref 19–32)
CO2: 28 mEq/L (ref 19–32)
CO2: 29 mEq/L (ref 19–32)
Calcium: 8.8 mg/dL (ref 8.4–10.5)
Calcium: 8.2 mg/dL — ABNORMAL LOW (ref 8.4–10.5)
Calcium: 8.4 mg/dL (ref 8.4–10.5)
Calcium: 8.9 mg/dL (ref 8.4–10.5)
Chloride: 111 mEq/L (ref 96–112)
Chloride: 106 mEq/L (ref 96–112)
Chloride: 106 mEq/L (ref 96–112)
Chloride: 111 mEq/L (ref 96–112)
Creatinine, Ser: 0.58 mg/dL (ref 0.4–1.2)
Creatinine, Ser: 0.51 mg/dL (ref 0.4–1.2)
Creatinine, Ser: 0.58 mg/dL (ref 0.4–1.2)
Creatinine, Ser: 0.71 mg/dL (ref 0.4–1.2)
GFR calc Af Amer: >60 mL/min (ref >60)
GFR calc Af Amer: >60 mL/min (ref >60)
GFR calc Af Amer: >60 mL/min (ref >60)
GFR calc Af Amer: >60 mL/min (ref >60)
GFR calc non Af Amer: >60 mL/min (ref >60)
GFR calc non Af Amer: >60 mL/min (ref >60)
GFR calc non Af Amer: >60 mL/min (ref >60)
GFR calc non Af Amer: >60 mL/min (ref >60)
Glucose, Bld: 146 mg/dL — ABNORMAL HIGH (ref 70–99)
Glucose, Bld: 147 mg/dL — ABNORMAL HIGH (ref 70–99)
Glucose, Bld: 169 mg/dL — ABNORMAL HIGH (ref 70–99)
Glucose, Bld: 188 mg/dL — ABNORMAL HIGH (ref 70–99)
Potassium: 3.2 mEq/L — ABNORMAL LOW (ref 3.5–5.1)
Potassium: 3.4 mEq/L — ABNORMAL LOW (ref 3.5–5.1)
Potassium: 3.8 mEq/L (ref 3.5–5.1)
Potassium: 3.9 mEq/L (ref 3.5–5.1)
Sodium: 142 mEq/L (ref 135–145)
Sodium: 140 mEq/L (ref 135–145)
Sodium: 141 mEq/L (ref 135–145)
Sodium: 142 mEq/L (ref 135–145)
Total Bilirubin: 0.3 mg/dL (ref 0.3–1.2)
Total Bilirubin: 0.3 mg/dL (ref 0.3–1.2)
Total Bilirubin: 0.3 mg/dL (ref 0.3–1.2)
Total Bilirubin: 0.3 mg/dL (ref 0.3–1.2)
Total Protein: 5.4 g/dL — ABNORMAL LOW (ref 6.0–8.3)
Total Protein: 4.4 g/dL — ABNORMAL LOW (ref 6.0–8.3)
Total Protein: 5 g/dL — ABNORMAL LOW (ref 6.0–8.3)
Total Protein: 5.1 g/dL — ABNORMAL LOW (ref 6.0–8.3)

## 2010-09-19 LAB — CBC
HCT: 29.4 % — ABNORMAL LOW (ref 36.0–46.0)
HCT: 30.8 % — ABNORMAL LOW (ref 36.0–46.0)
HCT: 31 % — ABNORMAL LOW (ref 36.0–46.0)
HCT: 33 % — ABNORMAL LOW (ref 36.0–46.0)
HCT: 35.1 % — ABNORMAL LOW (ref 36.0–46.0)
Hemoglobin: 10 g/dL — ABNORMAL LOW (ref 12.0–15.0)
Hemoglobin: 10.3 g/dL — ABNORMAL LOW (ref 12.0–15.0)
Hemoglobin: 10.4 g/dL — ABNORMAL LOW (ref 12.0–15.0)
Hemoglobin: 10.9 g/dL — ABNORMAL LOW (ref 12.0–15.0)
Hemoglobin: 11.4 g/dL — ABNORMAL LOW (ref 12.0–15.0)
MCHC: 33.9 g/dL (ref 30.0–36.0)
MCHC: 32.6 g/dL (ref 30.0–36.0)
MCHC: 33.1 g/dL (ref 30.0–36.0)
MCHC: 33.5 g/dL (ref 30.0–36.0)
MCHC: 33.7 g/dL (ref 30.0–36.0)
MCV: 90.7 fL (ref 78.0–100.0)
MCV: 91.3 fL (ref 78.0–100.0)
MCV: 91.4 fL (ref 78.0–100.0)
MCV: 91.5 fL (ref 78.0–100.0)
MCV: 92.5 fL (ref 78.0–100.0)
Platelets: 169 10*3/uL (ref 150–400)
Platelets: 148 10*3/uL — ABNORMAL LOW (ref 150–400)
Platelets: 149 10*3/uL — ABNORMAL LOW (ref 150–400)
Platelets: 159 10*3/uL (ref 150–400)
Platelets: 165 10*3/uL (ref 150–400)
RBC: 3.24 MIL/uL — ABNORMAL LOW (ref 3.87–5.11)
RBC: 3.36 MIL/uL — ABNORMAL LOW (ref 3.87–5.11)
RBC: 3.39 MIL/uL — ABNORMAL LOW (ref 3.87–5.11)
RBC: 3.61 MIL/uL — ABNORMAL LOW (ref 3.87–5.11)
RBC: 3.79 MIL/uL — ABNORMAL LOW (ref 3.87–5.11)
RDW: 13.5 % (ref 11.5–15.5)
RDW: 13.3 % (ref 11.5–15.5)
RDW: 13.6 % (ref 11.5–15.5)
RDW: 13.7 % (ref 11.5–15.5)
RDW: 13.8 % (ref 11.5–15.5)
WBC: 6 10*3/uL (ref 4.0–10.5)
WBC: 6.4 10*3/uL (ref 4.0–10.5)
WBC: 7.5 10*3/uL (ref 4.0–10.5)
WBC: 8.4 10*3/uL (ref 4.0–10.5)
WBC: 9 10*3/uL (ref 4.0–10.5)

## 2010-09-19 LAB — GLUCOSE, CAPILLARY
Glucose-Capillary: 126 mg/dL — ABNORMAL HIGH (ref 70–99)
Glucose-Capillary: 171 mg/dL — ABNORMAL HIGH (ref 70–99)

## 2010-09-19 LAB — DIFFERENTIAL
Basophils Absolute: 0 10*3/uL (ref 0.0–0.1)
Basophils Absolute: 0 10*3/uL (ref 0.0–0.1)
Basophils Absolute: 0 10*3/uL (ref 0.0–0.1)
Basophils Absolute: 0 10*3/uL (ref 0.0–0.1)
Basophils Relative: 1 % (ref 0–1)
Basophils Relative: 0 % (ref 0–1)
Basophils Relative: 0 % (ref 0–1)
Basophils Relative: 0 % (ref 0–1)
Eosinophils Absolute: 0.2 10*3/uL (ref 0.0–0.7)
Eosinophils Absolute: 0 10*3/uL (ref 0.0–0.7)
Eosinophils Absolute: 0.1 10*3/uL (ref 0.0–0.7)
Eosinophils Absolute: 0.1 10*3/uL (ref 0.0–0.7)
Eosinophils Relative: 3 % (ref 0–5)
Eosinophils Relative: 0 % (ref 0–5)
Eosinophils Relative: 1 % (ref 0–5)
Eosinophils Relative: 2 % (ref 0–5)
Lymphocytes Relative: 28 % (ref 12–46)
Lymphocytes Relative: 15 % (ref 12–46)
Lymphocytes Relative: 19 % (ref 12–46)
Lymphocytes Relative: 21 % (ref 12–46)
Lymphs Abs: 1.7 10*3/uL (ref 0.7–4.0)
Lymphs Abs: 1.3 10*3/uL (ref 0.7–4.0)
Lymphs Abs: 1.6 10*3/uL (ref 0.7–4.0)
Lymphs Abs: 1.6 10*3/uL (ref 0.7–4.0)
Monocytes Absolute: 0.3 10*3/uL (ref 0.1–1.0)
Monocytes Absolute: 0.4 10*3/uL (ref 0.1–1.0)
Monocytes Absolute: 0.4 10*3/uL (ref 0.1–1.0)
Monocytes Absolute: 0.5 10*3/uL (ref 0.1–1.0)
Monocytes Relative: 6 % (ref 3–12)
Monocytes Relative: 5 % (ref 3–12)
Monocytes Relative: 5 % (ref 3–12)
Monocytes Relative: 5 % (ref 3–12)
Neutro Abs: 3.8 10*3/uL (ref 1.7–7.7)
Neutro Abs: 5.5 10*3/uL (ref 1.7–7.7)
Neutro Abs: 6.2 10*3/uL (ref 1.7–7.7)
Neutro Abs: 7.2 10*3/uL (ref 1.7–7.7)
Neutrophils Relative %: 63 % (ref 43–77)
Neutrophils Relative %: 72 % (ref 43–77)
Neutrophils Relative %: 74 % (ref 43–77)
Neutrophils Relative %: 80 % — ABNORMAL HIGH (ref 43–77)

## 2010-09-19 LAB — CROSSMATCH
ABO/RH(D): O NEG
Antibody Screen: NEGATIVE

## 2010-09-19 LAB — PHOSPHORUS
Phosphorus: 3.3 mg/dL (ref 2.3–4.6)
Phosphorus: 1.9 mg/dL — ABNORMAL LOW (ref 2.3–4.6)
Phosphorus: 2.2 mg/dL — ABNORMAL LOW (ref 2.3–4.6)
Phosphorus: 4.8 mg/dL — ABNORMAL HIGH (ref 2.3–4.6)

## 2010-09-19 LAB — MAGNESIUM
Magnesium: 1.8 mg/dL (ref 1.5–2.5)
Magnesium: 1.5 mg/dL (ref 1.5–2.5)
Magnesium: 1.8 mg/dL (ref 1.5–2.5)
Magnesium: 1.8 mg/dL (ref 1.5–2.5)

## 2010-09-19 LAB — ABO/RH: ABO/RH(D): O NEG

## 2010-09-23 ENCOUNTER — Other Ambulatory Visit (INDEPENDENT_AMBULATORY_CARE_PROVIDER_SITE_OTHER): Payer: Self-pay | Admitting: Internal Medicine

## 2010-09-26 ENCOUNTER — Other Ambulatory Visit (HOSPITAL_COMMUNITY): Payer: PRIVATE HEALTH INSURANCE

## 2010-10-03 ENCOUNTER — Ambulatory Visit (HOSPITAL_COMMUNITY)
Admission: RE | Admit: 2010-10-03 | Discharge: 2010-10-03 | Disposition: A | Discharge status: Home / Self Care | Payer: Medicare Other | Source: Ambulatory Visit | Attending: Internal Medicine | Admitting: Internal Medicine

## 2010-10-03 DIAGNOSIS — R1310 Dysphagia, unspecified: Secondary | ICD-10-CM | POA: Insufficient documentation

## 2010-10-11 ENCOUNTER — Ambulatory Visit (HOSPITAL_COMMUNITY)
Admission: RE | Admit: 2010-10-11 | Discharge: 2010-10-11 | Disposition: A | Discharge status: Home / Self Care | Payer: Medicare Other | Source: Ambulatory Visit | Attending: Internal Medicine | Admitting: Internal Medicine

## 2010-10-11 ENCOUNTER — Encounter (HOSPITAL_BASED_OUTPATIENT_CLINIC_OR_DEPARTMENT_OTHER): Payer: Medicare Other | Admitting: Internal Medicine

## 2010-10-11 DIAGNOSIS — I10 Essential (primary) hypertension: Secondary | ICD-10-CM | POA: Insufficient documentation

## 2010-10-11 DIAGNOSIS — Z79899 Other long term (current) drug therapy: Secondary | ICD-10-CM | POA: Insufficient documentation

## 2010-10-11 DIAGNOSIS — K449 Diaphragmatic hernia without obstruction or gangrene: Secondary | ICD-10-CM

## 2010-10-11 DIAGNOSIS — E119 Type 2 diabetes mellitus without complications: Secondary | ICD-10-CM | POA: Insufficient documentation

## 2010-10-11 DIAGNOSIS — K3184 Gastroparesis: Secondary | ICD-10-CM | POA: Insufficient documentation

## 2010-10-11 DIAGNOSIS — R1310 Dysphagia, unspecified: Secondary | ICD-10-CM | POA: Insufficient documentation

## 2010-10-11 DIAGNOSIS — K319 Disease of stomach and duodenum, unspecified: Secondary | ICD-10-CM

## 2010-10-11 DIAGNOSIS — R933 Abnormal findings on diagnostic imaging of other parts of digestive tract: Secondary | ICD-10-CM

## 2010-10-11 HISTORY — PX: UPPER GASTROINTESTINAL ENDOSCOPY: SHX188

## 2010-10-11 HISTORY — PX: COLONOSCOPY: SHX174

## 2010-10-22 NOTE — H&P (Signed)
[name], [name]               ACCOUNT NO.:  [id number]   MEDICAL RECORD NO.:  [id number]          PATIENT TYPE:  AMB   LOCATION:  DAY                           FACILITY:  [facility]   PHYSICIAN:  Tilford Pillar, MD      DATE OF BIRTH:  03-25-1941   DATE OF ADMISSION:  DATE OF DISCHARGE:  LH                              HISTORY & PHYSICAL   CHIEF COMPLAINT:  Colon cancer.   HISTORY OF PRESENT ILLNESS:  The patient is a 70 year old female with a  history of lower gastrointestinal bleeding and multiple colonoscopies by  Dr. Karilyn Cota in [place] for evaluation of an area in her hepatic flexure  which continued to have a pedunculated lesion.  In discussion with Dr.  Karilyn Cota, this had been evaluated, and approximately 2 years ago, she had  had a polypectomy which the patient tolerated extremely well.  On her  repeat colonoscopy, the patient was noted to have a recurrence in this  area.  An additional endoscopic treatment was undertaken with continued  path demonstrating adenomatous changes on the third reevaluation.  Again, recurrence was noted and on this biopsy, it was noted to have  evidence of both adenoma formation as well as carcinomatous changes.  Therefore, she was referred to my office as an outpatient for additional  discussion and evaluation.  At this time, the patient denies any  hematochezia or melena.  She has had no significant weight changes.  She  has had no fever or chills.  No abdominal pain.  No other  symptomatology.  She does have a family history of renal cancer and  history of diabetes in her family, but no history of colon cancer.   PAST MEDICAL HISTORY:  Diabetes mellitus type 2, hypercholesterolemia,  hypertension, chronic back pain, anxiety, and glaucoma.   PAST SURGICAL HISTORY:  Tonsils and adenoidectomies, cholecystectomy,  hysterectomy, and previous knee surgery.   MEDICATIONS:  Medications were reviewed with the patient including  metformin, Lipitor, raloxifene,  metoclopramide, lansoprazole, Cymbalta,  enalapril, Plavix, temazepam, Topamax, baclofen, propoxyphene,  oxycodone, flaxseed oil, lidocaine patch, and Latanoprost ophthalmologic  drops.   ALLERGIES:  MORPHINE and CODEINE.   SOCIAL HISTORY:  No tobacco, no alcohol use.  No recreational drug use.  Occupation retired.   REVIEW OF SYSTEMS:  CONSTITUTIONAL:  Occasional headaches.  EYES:  History of glaucoma.  EARS, NOSE AND THROAT:  Rhinorrhea.  RESPIRATORY:  Unremarkable.  CARDIOVASCULAR:  Unremarkable.  GASTROINTESTINAL:  Abdominal pain occasionally.  GENITOURINARY:  Unremarkable.  MUSCULOSKELETAL:  Arthralgias of the neck, back, and joints.  SKIN:  Unremarkable.  ENDOCRINE:  Unremarkable.  NEURO:  Unremarkable.   PHYSICAL EXAMINATION:  GENERAL:  The patient is an elderly-appearing,  calm female in no acute distress.  She is alert and oriented x3.  HEENT:  Scalp; no deformities, no masses.  Eyes; pupils equal, round,  reactive.  Extraocular movements are intact.  No conjunctival pallor was  noted.  Oral mucosa was pink.  Normal occlusion.  NECK:  Trachea is midline.  No cervical lymphadenopathy.  PULMONARY:  Unlabored respiration.  No wheezes.  She is  clear to  auscultation bilaterally.  CARDIOVASCULAR:  Regular rate and rhythm.  No murmurs or gallops are  apparent in the office today.  She has 2+ radial pulses bilaterally.  ABDOMEN:  Positive bowel sounds.  Abdomen is soft, nontender.  No  hernias.  No masses are elicited.  SKIN:  Warm and dry.   ASSESSMENT AND PLAN:  Colon cancer associated with the hepatic flexure.  At this time, I did discuss with the patient the risks, benefits, and  alternatives of laparoscopic versus an open hemicolectomy including but  not limited to risk of bleeding, infection, anastomotic leak, bowel  injury, as well as possibility of intraoperative cardiac and pulmonary  events.  At this time, the patient has gone for her preoperative  evaluation and  noted changes were noted on her EKG, at which time, she  will be further evaluated by her primary and possible for cardiac  clearance.  Pending cardiac clearance, the patient will be scheduled for  planned laparoscopic hemicolectomy of the hepatic flexure.      Tilford Pillar, MD  Electronically Signed     BZ/MEDQ  D:  08/16/2008  T:  08/16/2008  Job:  [id number]   cc:   Mila Homer. Sudie Bailey, M.D.  Fax: [phone]   Lionel December, M.D.  Fax: [phone]   [facility]

## 2010-10-22 NOTE — Op Note (Signed)
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   PROCEDURE:  1. Laparoscopic hand-assisted right hemicolectomy (extended).  2. Laparoscopically placed subcuticular On-Q pain pump catheters for      local analgesia control.   SURGEON:  Tilford Pillar, MD   ANESTHESIA:  General endotracheal local anesthetic 0.25% Marcaine via  the On-Q catheter system.   SPECIMEN:  Right colon, terminal ileum and secondary specimen of distal  transverse colon.   ESTIMATED BLOOD LOSS:  75 mL.   INDICATIONS:  The patient is a 70 year old female who was referred to my  office by Dr. Karilyn Cota after performing several colonoscopies which  revealed an adenomatous polyp in the hepatic flexure.  He biopsied this  on several occasions, the third biopsy demonstrating adenomatous changes  as well as carcinomatous changes.  The patient was referred for  resection.  The risks, benefits, alternatives of a laparoscopic hand-  assisted possible open hemicolectomy was discussed at length with the  patient including but not limited to the risk of bleeding, infection,  anastomotic leak, bowel injury, possible requirement of an ostomy,  possible intraoperative cardiac and pulmonary events.  The patient's  questions and concerns were addressed and the patient was consented for  the planned procedure.   OPERATION:  The patient was taken to the operating, was placed in supine  position on the operating table and general anesthetic was administered.  When the patient was asleep,  she was endotracheally intubated by  anesthesia.  At this point, a Foley catheter was placed in standard  sterile fashion and a nasogastric tube was placed by anesthesia.  The  abdomen was prepped with DuraPrep solution and then drapes were placed  in standard fashion.  A stab incision was created infraumbilically with  an 11 blade scalpel.  Additional dissection down through the  subcutaneous tissue was carried out using a Kocher clamp.  It was  utilized to grasp the anterior abdominal fascia __________.  A Veress  needle was inserted.  Saline drop test was utilized to confirm  intraperitoneal placement and then pneumoperitoneum was initiated.  Once  sufficient pneumoperitoneum was obtained, an 11 mm trocar was inserted  over the laparoscope allowing visualization of the trocar entering the  peritoneal cavity.  At this time, the inner cannula was removed.  The  laparoscope was reinserted.  There was no evidence of any trocar or  Veress needle placement injury.  However, upon reevaluation, there was  noted be extensive adhesion along the anterior abdominal wall cranially  to the area of trocar placement.  Therefore, I made a clear area to  place the trocar  in the right inferior lateral abdominal wall and I also  placed an additional trocar at this time.  The 11-mm trocar was placed  in standard fashion under direct visualization.  Additionally, having  exposure to the left upper lateral abdominal wall, I also opted to place  a 5-mm trocar in this position.  At this time, I used a combination of  blunt and Harmonic Scalpel dissection to free the extensive adhesions  from the anterior abdominal wall.  I did note that the transverse colon  what appeared to be the hepatic flexure and involved within these  adhesions.  Care was taken not to injure the bowel during dissection.  Upon fully mobilizing the adhesions off the anterior abdominal wall and  having exposure of the right hepatic inferior  border, I then opted at  this time to place the hand port.   At this time, I did convert the infraumbilical trocar site to a hand  port, enlarging the initial incision with scalpel and then taking the  dissection down through the subcutaneous tissue and dividing the fascia  with electrocautery.  Upon creating an adequate-sized incision and  fascial defect, I placed the hand port in standard fashion and  __________ insufflation.  On inserting a hand, I did palpate the 4  quadrants.  There were noted to be a significant adhesions of the right  colon to the anterior edge of the right lobe of the liver secondarily  and most likely due to the patient's history of a prior open  cholecystectomy.  At this time, I also palpated the liver edge and the  right lobe of the liver.  I  was not able to palpate any nodularity or  granularity of the liver capsule __________ abdominal wall palpation.  At this time, I began turning my attention to mobilize in the right  colon.   Using continuity Harmonic scalpel, I proceeded to free the peritoneal  reflection along the right paracolic gutter with the ascending colon, I  continued the dissection distally along the colon.  Upon encountering  the adhesions along the liver, I took care to dissect these free,  freeing the hepatic flexure from the right lobe of the liver.  I  continued with dissection and  divided the gastrocolic ligament using  the Harmonic scalpel __________ I then turned my attention proximally.  The appendix was free its attachments as well as the terminal ileum.  With these structures mobilized, I created a window in the terminal  ileum as the portion easily reached the upper peritoneal cavity.  A  window was created in the mesentery of the small bowel and at this time  a window created between the small bowel mesentery and small bowel.  I  divided the terminal ileum with an Endo-GIA 45 regular load.  I note at  this time, I had also  converted the 5-mm trocar to an 11 mm trocar in  order to have access for the placement of the stapling device.  With the  proximal bowel divided, then used a LigaSureto continue to divide the  mesentery distally, including the small bowel mesentery as well as  mesoappendix in the mesocolon, continued with the dissection up around  the right hepatic flexure and at this time having adequate mobility  opted to decompress the abdomen and pull the bowel through the hand  port.   With the __________ exposed, I did continue to palpate the __________.  No clear area of thickening or palpable nodule  was encountered.  At this  point, I had created a window in the transverse mesocolon and divided  the transverse colon with a reload of the Endo-GIA stapler.  Again the  LigaSure was utilized to complete the division of the  mesocolon from  the transverse colon.  At this time, the specimen was free, it was  placed in the back table.  I then opted at this time to open the  specimen.  On close inspection it was difficult to identify any area of  thickening.  There there was an area of suspicion in the general area of  the hepatic flexure.  I did place a silk suture ligature for identifying  the area and, again, it was not clearly seen in the area of recent  biopsy.  I opted to proceed with additional resection.   At this time, I returned to the field and through the hand port  incision, I was able to deliver additional transverse colon and this  time just divided distally from the transverse colon with an additional  specimen, at this time, having converted to an extended right  hemicolectomy.  Again, palpation did not demonstrate any evidence of any  nodularity or thickening of the tissue, but again, at this time felt  adequate length has been obtained based on the descriptions provided by  Dr. Patty Sermons colonoscopy.   At this time, I turned my attention to creating the anastomoses, having  adequate  length of the both proximal and distal resected ends.  At this  time, I did place the divided ends of the bowel adjacent in a side-to-  side fashion  and pexing these together with a 3-0 silk simple  interrupted suture.  At this time, I created an enterotomy and a small  bowel defect with electrocautery and then using a reloaded Endo-GIA 45  stapler, I created the side-to-side stapled anastomosis.  The remaining  enterotomy defect was approximated using interrupted 3-0 silk sutures.  At this time, I was quite pleased with the appearance.  I also placed an  additional pexing suture in the apex of the anastomosis and  reapproximated the small bowel mesenteric and mesocolonic defects with  simple interrupted sutures.  Palpation of the anastomosis  demonstratedthis to be widely patent.  I copiously irrigated the  abdominal cavity and at this time returned the anastomosis back into the  abdominal cavity.  At this time, I did reinsufflate, reinspected the  abdominal cavity under insufflation.  I was quite pleased with the  appearance and the lay of the  and at this time, I turned my attention  to placement of the pain pump catheter.   Through a subcostal stab incision, I did use the tunneling device for  the on-Q pain pump.  These were placed under visualizationwith the  catheters both under the right and left subcostal margins.  The  catheters were secured with combination benzoin and Steri-Strips and  with plans to finally secure this at the completion of the procedure  with a Tegaderm dressing.   Closure:  this time, I did turn my attention to closing my defect.  The  hand-port and trocars were removed using a 2-0 Vicryl suture.  The 11 mm  trocar sites were approximated and then the fascia was reapproximated  along the hand port with a 0 looped Novofil x2.  The hand port fascia  reapproximated, the subcuticular tissue was irrigated.  The skin edges  were reapproximated with skin staples.  Sterile dressings were placed  over the incisions and a Tegaderm dressing was placed over the On-Q pain  pump catheters.  At this time, the drapes removed.  The patient was  allowed to come out of the general aesthetic and was transferred back to  the regular hospital bed.  She was transferred to postanesthetic care  unit in stable condition.  At the conclusion of procedure, all  instrument, sponge and needle counts were correct.  The patient  tolerated the procedure extremely well.      Tilford Pillar, MD  Electronically Signed     BZ/MEDQ  D:  08/18/2008  T:  08/19/2008  Job:  [id number]   cc:   Mila Homer. Sudie Bailey, M.D.  Fax: [phone]   Lionel December, M.D.  Fax: [phone]

## 2010-10-22 NOTE — Op Note (Signed)
[name], SZALKOWSKI               ACCOUNT NO.:  [id number]   MEDICAL RECORD NO.:  [id number]          PATIENT TYPE:  INP   LOCATION:  3538                         FACILITY:  [facility]   PHYSICIAN:  Donalee Citrin, M.D.        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   PROCEDURE:  Anterior cervical diskectomies and fusion at C4-5, C5-6, and  C6-7 using 5-mm allograft wedge at C4-5, 7-mm wedges at C5-6 and C6-7,  and the Atlantis translational plating system with 4 fixed angle 13-mm  screws and two variable angle 13-mm screws.   SURGEON:  Donalee Citrin, M.D.   ASSISTANT:  Dr. Gerlene Fee.   ANESTHESIA:  General endotracheal.   HISTORY OF PRESENT ILLNESS:  The patient is a very pleasant 70 year old  female who has had longstanding chronic neck pain with radiation down  both arms predominantly in the left down to her hand, thumb, and first  two fingers.  The patient failed all forms of conservative treatment  with antiinflammatories, physical therapy, steroids, and injections.  The patient has progressive worsening neck pain, arm pain with slight  subtle weakness of her left triceps.  Imaging revealed cervical  spondylosis with severe stenosis and bifemoral stenosis worse on the  left.  The patient was recommended anterior cervical diskectomy and  fusion.  The risks and benefits of the operation were explained to the  patient.  The patient understood and agreed to proceed forward.   PROCEDURE IN DETAIL:  The patient was brought to the OR, was induced  under general anesthesia, positioned and the neck flexed in extension  with 5 pounds of halter traction.  The right side of her neck was  prepped and draped in the usual sterile fashion.  Preop x-ray localized  the  appropriate level.  A curvilinear incision was made distal to the  midline to the anterior border of sternocleidomastoid and superficial  layer of the platysma, this was dissected out and divided  longitudinally.  The avascular plane between the sternocleidomastoid and  strap muscle was developed down to the prevertebral fascia.  Prevertebral fascia was dissected with Kitners.  Intraoperative x-ray  confirmed localization at the appropriate level at C5-6, so annulotomy  was made with 15-mm scalpel to mark the disk space, and then the longus  colli was reflected laterally.  This was dissected off of C4-5, C5-6,  and C6-7 vertebral bodies, and self-retaining retractor was placed.  First working on C5-6, and C6-7.  Large anterior osteophytes bitten off  the C5 and C6 vertebral bodies respectively.  Both disk spaces were  drilled down to the posterior annulus and osteophytic complex.  At this  point, the operative microscope was draped and brought into field and  under microscope illumination.  Further drilling of the posterior spur  was drilled down the posterior annulus and posterior longitudinal  ligament.  Then,  using 1-mm Kerrison punch first working at C6-7, the  aggressive underbiting of both C6 and C7 vertebral bodies was  undertaken.  The PLL was then identified.  Dissected off of the dura and  removed in piecemeal fashion exposing the thecal sac.  Aggressive  decortication was carried out centrally.  Marching across laterally to  both C7 pedicles and both C7 neural foramina were then opened up and  decompressed.  There was noted to be  marked spondylosis with central  spurs at both C6 and C7 vertebral bodies but these were all aggressively  underbitten, decompression of central canal.  Then, Gelfoam was placed  in this level and attention was taken at C5-6 in similar fashion.  Posterior spurring was drilled off, aggressively underbiting of both  endplates were carried out.  Both C6  pedicles were identified and both  C6 nerve root was decompressed significant out of the pedicle.  Then,  there was noted to be marked scarring of the PLL to the dura at this  level, so there was small amount of ligament that was left centrally  that was densely adherent to the dura.  After confirmation of adequate  central decompression and bifemoral decompression, both endplates were  scarped.  A size 7-mm graft was inserted at C5-6 and same at C6-7.  After both interspaces had been irrigated, meticulous hemostasis was  maintained.  After these interbody plugs were then placed and retractor  was repositioned to work on C4-5.  In a similar fashion, C4-5 was  drilled down.  After removing the microscope and then bringing it back  in, under microscopic illumination aggressively underbiting of both C4  and C5 vertebral bodies were undertaken.  There was large bone spur  coming off the left-side of the canal at C4-5 vertebral body.  This was  all aggressively underbitten, identified both C5 pedicles and both C5  nerve roots were skeletonized and flushed with pedicle.  The endplates  were scraped.  The meticulous hemostasis was maintained, was copiously  irrigated, 5-mm graft was inserted at C4-5.  Then, translation plates of  appropriate size plate was sized.  Then, initially fixed angle screw was  placed in the C6 vertebral body to hold the plate.  Then, the first  screw placed in C4 it was hugging a little bit too close to the  endplates, so this was removed, redirected and variable angle screw was  inserted at C4 due to the angle.  Then, fix angled screws were placed at  C6 and C7.  There was unable to place screws in C5 due to the  positioning of the holes and the size of plate and her anatomy.  But all  screws had excellent purchase.  Locking mechanisms were engaged.  The  wound was copiously irrigated.  Meticulous hemostasis was maintained.  Postoperative fluoroscopy confirmed good  positioning of plates, screws,  and bone grafts.  Then, platysma was reapproximated with interrupted  Vicryl and the skin was closed with running 4-0 subcuticular.  Benzoin  and Steri-Strips were applied.  The patient went to recovery room in  stable condition.  At the end of the case, sponge and instrument count  were correct.           ______________________________  Donalee Citrin, M.D.     GC/MEDQ  D:  11/10/2008  T:  11/11/2008  Job:  [id number]

## 2010-10-22 NOTE — Discharge Summary (Signed)
NAMESHERRAN, MARGOLIS               ACCOUNT NO.:  [id number]   MEDICAL RECORD NO.:  [id number]          PATIENT TYPE:  INP   LOCATION:  A334                          FACILITY:  [facility]   PHYSICIAN:  Tilford Pillar, MD      DATE OF BIRTH:  07-08-1940   DATE OF ADMISSION:  08/18/2008  DATE OF DISCHARGE:  03/16/2010LH                               DISCHARGE SUMMARY   ADMISSION DIAGNOSES:  Hepatic flexure, colon cancer.   DISCHARGE DIAGNOSES:  1. Hepatic flexure, colon cancer, status post laparoscopic right      extended hemicolectomy.  2. Gastroesophageal reflux disease.  3. History of anxiety.  4. History of osteoarthritis.  5. She has a history of atherosclerotic vascular disease.   DISPOSITION:  Home.   BRIEF HISTORY AND PHYSICAL:  Please see the admission history and  physical for the complete H and P.  The patient is a 70 year old female  who is referred to my office by Dr. Karilyn Cota after several on  colonoscopies, up on which the final colonoscopy did demonstrate a foci  of carcinoma on a biopsy of an adenomatous polyp.  Risks, benefits, and  alternatives of a laparoscopic possible open cholecystectomy has been  discussed with the patient.  The patient was admitted for planned  procedure.   HOSPITAL COURSE:  The patient was admitted on August 18, 2008.  She  underwent an uneventful laparoscopic hand assisted right extended  hemicolectomy.  She tolerated the procedure well and returned to  Postanesthetic Care Unit and was then transferred back to regular  hospital floor.  She continued to have increased activity.  She  eventually have started passage of flatus.  She was advanced on her diet  with positive bowel movement.  The patient will continue to advanced on  her diet, switched to oral pain medications, was ambulatory and plans  were made for discharge.  On August 22, 2008, the patient was ambulatory,  tolerating regular diet, tolerating oral pain medications, and was  having  bowel function.  Therefore plans were made for discharge to home.   DISCHARGE INSTRUCTIONS:  The patient was instructed to continue regular  diet as tolerated with encouragement of high fiber.  She is instructed  on wound care, to continue wash the surgical incision with soap and  water.  She is to increase activity as tolerated.  She should not lift  anything greater than 20 pounds for the next 4-6 weeks.  She may shower,  but she has not to soak the incision for the next 3 weeks.  She is not  to drive while taking narcotic pain medication.  She is return to see me  in my office in 3 week or in 10 days for a staple removal.   DISCHARGE MEDICATIONS:  1. Lortab 5/500 one-two p.o. q.4 h. p.r.n. pain.  2. The patient has to continue all previously prescribed home      medications.      Tilford Pillar, MD  Electronically Signed     BZ/MEDQ  D:  08/22/2008  T:  08/23/2008  Job:  [id number]  cc:   Lionel December, M.D.  Fax: [phone]   Dr. Delton See

## 2010-10-25 NOTE — Consult Note (Signed)
[facility]  Patient:    [name], [name]                      MRN: [id number] Proc. Date: 01/02/00 Adm. Date:  [id number] Attending:  Thyra Breed CC:         Mila Homer. Sudie Bailey, M.D. [place], [place]                          Consultation Report  HISTORY:  The patient presents as a work in today.  She has stopped her morphine on Monday and she is having difficulties with a sense that things are crawling on her since then, sweating, jitters and a sense of just being out of sorts.  She continues to have her headache.  She rates her pain at 6/10.  She continues on the Zanaflex.  PHYSICAL EXAMINATION:  VITAL SIGNS:  Blood pressure is up at 202/106, heart rate 74, respiratory rate 18, O2 saturations 97%, pain level 6/10.  She is shaky and teary eyed.  She is not perspiring heavily.  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  DISPOSITION: 1. Resume opiates in the form of OxyContin 10 mg, one p.o. b.i.d., #22 with no    refill.  She is aware of the side effects of this. 2. The nurse and the patient will go ahead and flush her opiates down the    commode. 3. The patient stated that she developed shortness of breath on the morphine,    and does not wish to continue on this. DD:  01/02/00 TD:  01/04/00 Job: [id number]

## 2010-10-25 NOTE — H&P (Signed)
[facility]  Patient:    [name], [name]                      MRN: [id number] Adm. Date:  [id number] Disc. Date: [id number] Attending:  Thyra Breed CC:         John Giovanni, M.D., [place], [place]                         History and Physical  FOLLOWUP EVALUATION:  The patient comes in for followup evaluation of her chronic headaches.  Since her last evaluation, she has done fairly well on her current regimen of Zanaflex 4 mg, two and a half tablets every eight hours. She seems to be tolerating her headaches very well and she rates her pain at 3/10.  She complains not only of occipital but entire head pain.  The weather changes tend to make it worse.  She is uncertain as to how much the Zanaflex is helping her.  She is not interested in going on anything stronger right now.  She continues with the following medications in addition to the Zanaflex:  Tolbutamide 500 mg two per day, Metformin 1000 mg twice a day, Reglan, atorvastatin, lansoprazole, venlafaxine, temazepam and her Xanax; she is also on an estrogen replacement.  PHYSICAL EXAMINATION  VITAL SIGNS:  Blood pressure is 138/68, heart rate is 75, respiratory rate is 22, O2 saturation is 97% and pain level is 3/10.  NECK:  She has got good range of motion of her neck to about 25% of limitation today with good extension and forward flexion.  MUSCULOSKELETAL:  Deep tendon reflexes are symmetric in the upper extremities. She demonstrates bony enlargement of the PIPs, DIPs and first carpometacarpal joints.  NEUROLOGIC:  Sensory exam over her face is grossly intact.  IMPRESSION 1. Atypical facial pain. 2. Headaches, which I believe are predominantly cervicogenic with underlying    cervical spondylosis. 3. Other medical problems per Dr. John Giovanni.  DISPOSITION 1. Continue on Zanaflex 4 mg two and a half tablets three times a day,    prescription given for 1800 mg. 2. The patient has not  started on riboflavin and I encouraged her to go ahead    and start this. 3. Continue with her glucosamine. 4. Follow up with me in three months or sooner if she has a re-exacerbation. DD:  07/13/00 TD:  07/14/00 Job: [id number]

## 2010-10-25 NOTE — Discharge Summary (Signed)
NAMESHEILIA, REZNICK               ACCOUNT NO.:  [id number]   MEDICAL RECORD NO.:  [id number]          PATIENT TYPE:  INP   LOCATION:  A334                          FACILITY:  [facility]   PHYSICIAN:  Tilford Pillar, MD      DATE OF BIRTH:  [birth year]   DATE OF ADMISSION:  08/18/2008  DATE OF DISCHARGE:  03/16/2010LH                               DISCHARGE SUMMARY   ADMISSION DIAGNOSIS:  Colon cancer of hepatic flexure.   DISCHARGE DIAGNOSES:  1. 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  2. Diabetes mellitus type 2.  3. Hypercholesterolemia.  4. Hypertension.  5. Chronic back pain.  6. Anxiety.  7. History of glaucoma.   PROCEDURES:  Laparoscopic hand-assisted right hemicolectomy with primary  anastomoses.   DISPOSITION:  Home.   BRIEF HISTORY AND PHYSICAL:  Please see the admission history and  physical for complete H&P.  The patient is a 70 year old female, who has  had extensive workup by Dr. Karilyn Cota in [place] for evaluation of a nodule at  her hepatic flexure of the right colon.  This was eventually diagnosed  as a colon cancer and was advised the patient to undergo a  hemicolectomy.  Risks, benefits, and alternatives were discussed and the  patient was admitted for a planned intervention and management.   HOSPITAL COURSE:  The patient was admitted on August 18, 2008.  She had  undergone a bowel prep over the previous evening at home.  She was taken  to the operating room on the same day.  She underwent the above  mentioned laparoscopic hand-assisted right hemicolectomy which she  tolerated extremely well.  She spent a brief period in the  postanesthetic care unit and was transferred back to regular hospital  bed.  She was continued to be monitored closely.  She was advanced on  clear diet until bowel function return.  Upon return of bowel function,  the patient was advanced on the diet.  She was advanced on oral pain  medication.  She was ambulatory.  On August 22, 2008, the  patient was  tolerating regular diet.  She was ambulatory.  Pain was controlled oral  pain medicine, and she had normal bowel function.  At this time, the  patient was discharged home.   DISCHARGE INSTRUCTIONS:  The patient was instructed to continue a  regular diet with high fiber.  She was instructed that she may continue  to increase activities.  She is not to lift anything greater than 20  pounds over the next 3 weeks.  She may shower.  She is to wash the areas  with soap and water.  She is to see me in 10 days for staple removal.   DISCHARGE MEDICATIONS:  1. The patient is to resume all previously prescribed home      medications.  2. Lortab 5/500 one to two p.o. q.4 h. p.r.n. pain.      Tilford Pillar, MD  Electronically Signed     BZ/MEDQ  D:  09/20/2008  T:  09/21/2008  Job:  [id number]

## 2010-10-25 NOTE — Procedures (Signed)
[facility]  Patient:    [name], [name]                      MRN: [id number] Proc. Date: 10/25/99 Adm. Date:  [id number] Attending:  Thyra Breed CC:         Stefani Dama, M.D.             Dr. Metta Clines, [place], [place].                           Procedure Report  PROCEDURE:  Bilateral occipital nerve blocks.  DIAGNOSIS:  Cervical spondylosis with underlying occipital neuralgia.  INTERVAL HISTORY:  The patient noted that her pain seemed to reoccur after her injections and she wished to go ahead and get another injection or two to see whether she can bring this under better control. She has stopped taking the Demerol and now is only taking Ultram on a p.r.n. basis. Her diabetic medications are unchanged continuing with the dobutamine, glucophage. She continues on the Reglan, Prevacid, Lipitor, Effexor and Ultram.  PHYSICAL EXAMINATION:  Blood pressure 166/83, heart rate 64, respiratory rate 12, O2 saturations 95%, pain level is 4/10 and temperature is 97.7.  NECK:  Demonstrates no change in range of motion. She exhibits tenderness over the greater occipital grooves bilaterally.  NEUROLOGIC:  Unchanged from her last visit.  DESCRIPTION OF PROCEDURE:  The areas of greatest tenderness over the greater occipital grooves were marked and prepped with Betadine x 3. The Betadine was allowed to dry. Using a 25 gauge needle, I injected 1.5 cc of local anesthetic at each site. The local anesthetic consisted of a mixture of 3 cc of 1 percent lidocaine with 5 cc of 0.5 percent levobupivacaine with 40 mg of Medrol. The patient received a total dose of 15 mg of Medrol. The needle was removed intact.  CONDITION POST PROCEDURE:  The patient noted anesthesia in the occipital nerve distribution bilaterally. Her pain level was down.  DISPOSITION: 1. Resume previous diet. 2. Limitations in activities per instruction sheet. 3. Continue on current medications, but  the patient was given information    on glucosamine to review. 4. Followup with me in 1 week to consider repeating the occipital nerve    block again. DD:  10/25/99 TD:  10/30/99 Job: [id number]

## 2010-10-25 NOTE — Op Note (Signed)
[facility]  Patient:    [name], [name] Visit Number: [id number] MRN: [id number]          Service Type: END Location: DAY Attending Physician:  Malissa Hippo Dictated by:   Lionel December, M.D. Proc. Date: 07/14/01 Admit Date:  07/14/2001   CC:         John Giovanni, M.D.   Operative Report  PROCEDURE:  Esophagogastroduodenoscopy with esophageal dilatation and duodenal biopsy.  ENDOSCOPIST:  Lionel December, M.D.  INDICATION:  The patient is a 70 year old Caucasian female with multiple medical problems including diabetes mellitus and chronic GERD, along with diabetic gastroparesis, who has been experiencing frequent heartburn, particularly in the postprandial period, and she is also have frequent solid food dysphagia.  She has a long history of GERD.  She had open Nissen fundoplication in 1969.  She has undergone dilatation in the past; her last one was in July of 2000.  At that time, she had a barium study which showed delay in passage of barium pill across LES.  On EGD, no obvious ring or stricture was noted but she responded to dilatation.  She has been having this swallowing difficulty for about three months now.  She is on Reglan 15 mg q.i.d. and Prevacid 30 mg b.i.d.  She is unable to drop p.m. dose of Prevacid because of breakthrough symptoms.  Procedure was reviewed with the patient and informed consent was obtained.  PREOPERATIVE MEDICATION:  Cetacaine spray for oropharyngeal topical anesthesia; Demerol 50 mg IV and Versed 5 mg IV in divided dose.  INSTRUMENT:  EVIS-100 Olympus system.  FINDINGS:  Procedure performed in endoscopy suite.  Patients vital signs and O2 saturations were monitored during procedure and remained stable.  Patient was placed in the left lateral decubitus position and endoscope was passed via oropharynx without any difficulty into esophagus.  Esophagus:  Mucosa of the esophagus was normal throughout.   Body was somewhat tortuous.  Squamocolumnar junction was unremarkable.  While it was not wide open or patulous, no ring or stricture were noted.  Scope was easily passed across LES into stomach.  Stomach:  Stomach had a moderate-to-large amount of food debris in it; as a result, examination was suboptimal.  Pyloric channel was patent.  Angularis and fundus were examined by retroflexing the scope.  Fundal wrap was short and at least structurally intact.  Part of the mucosa of the stomach that was examined did not show any ulceration or other abnormalities.  Duodenum:  Examination of the bulb revealed normal mucosa.  There was some food debris in the bulb and second part of the duodenum.  Mucosal folds in the second part of the duodenum were swollen, very prominent and had fish-scale appearance; I am not sure if this was the result of edema or food debris, but this would not wash away, therefore, biopsy was taken to make sure she does not have celiac disease.  Endoscope was withdrawn.  Esophageal dilatation was performed by passing 58-French Maloney dilator through the esophagus completely.  As the dilatation was completed, esophagus was reexamined and there was no mucosal injury.  I opted to dilated LES with 20-mm balloon.  The balloon catheter was positioned across the LES and insufflated to a desired pressure and maintained for two minutes.  Balloon dilator was deflated and withdrawn.  Patient tolerated the procedure well.  FINAL DIAGNOSES: 1. No evidence of esophagitis, stricture or ring formation. 2. Short fundal wrap which is still intact. 3. Moderate-to-large amount of  food debris in the stomach and duodenum    consistent with known diagnosis of diabetic gastroparesis. 4. Prominent swollen duodenal folds, biopsy taken looking for evidence of    celiac disease. 5. Esophagus was dilated by passing 58-French and lower esophageal sphincter    was redilated with 20-mm  balloon.  RECOMMENDATIONS:  She will continue antireflux measures and Prevacid as before.  I have asked her to change her Reglan to 20 mg before each meal and she can take 10 mg at bedtime if she needs to.  I will be contacting patient with results of biopsies.  If she remains with heartburn, regurgitation and bloating postprandially, may consider switching to domperidone or even repeating GES while on therapy. Dictated by:   Lionel December, M.D. Attending Physician:  Malissa Hippo DD:  07/14/01 TD:  07/15/01 Job: [id number]

## 2010-10-25 NOTE — Op Note (Signed)
[place]  Patient:    [name], [name]                      MRN: [id number] Proc. Date: 10/31/99 Adm. Date:  [id number] Attending:  Thyra Breed CC:         Dr. Sudie Bailey, [place].                           Operative Report  FOLLOW-UP EVALUATION  Johari comes in for follow-up evaluation of her recurrent headaches and neck discomfort with underlying cervical spondylosis.  Since her last evaluation, the patient has noted sustained improvements with the injections.  She is asking about other treatment options.  The patient currently is on dobutamine, glucophage, Reglan, Premarin, Prevacid, Lipitor, Effexor and Ultram.  She also takes Zyprex at times.  She has never been on Zanaflex.  She describes pain over her entire head.  Turning her head increases her discomfort.  She has no new weakness or numbness and tingling.  PHYSICAL EXAMINATION:  Blood pressure is 156/79, heart rate is 64, respiratory rate is 14, oxygen saturations 88%.  Temperature is 98.8 degrees. Pain level is 7/10.  Range of motion of the neck is unchanged.  She has tenderness over the entire scalp to palpation.  There were no discrete areas over the occipital regions.  Deep tendon reflexes in the upper extremities were symmetric.  Motor was unchanged from previously.  She had degenerative changes over the small joints of her hands and first carpometacarpal joints.  IMPRESSION: 1. Cervicogenic headaches, superimposed on history of migraine headaches and    probable tension headaches. 2. Cervical spondylosis. 3. Other medical problems per Dr. Sudie Bailey.  DISPOSITION: 1. Continue on current medications. 2. Trial of Zanaflex beginning with 2 mg in the evening and gradually    increasing this per protocol sheet from [facility].  She was given a prescription    for 100 with two refills. 3. Follow up with me in six weeks. DD:  10/31/99 TD:  11/04/99 Job: [id number]

## 2010-10-25 NOTE — Consult Note (Signed)
[facility]  Patient:    [name], [name]                      MRN: [id number] Proc. Date: 04/13/00 Adm. Date:  [id number] Attending:  Thyra Breed CC:         John Giovanni, M.D., [place], [place]   Consultation Report  FOLLOWUP EVALUATION:  The patient comes in for followup evaluation of her atypical facial headaches and cervicogenic headaches, reflective of her cervical spondylosis.  Since her previous evaluation, the patient has continued to have ups and downs.  She has recently had some increased problems with her headaches and Dr. John Giovanni has put her on a prednisone Dosepak.  She rates her pain today at 4/10.  The headaches seem to be improved by the corticosteroid Dosepak.  She continues on the Zanaflex and is taking 8 mg in the morning, 10 mg at midday and 10 mg later in the day.  She cannot identify any exacerbating or relieving features.  EXAMINATION  VITAL SIGNS:  Blood pressure 193/89, heart rate is 65, respiratory rate is 18, O2 saturation is ______ and pain level is 4/10.  NECK:  Neck demonstrated little change in range of motion, with about 50% of normal rotation, extension, forward flexion and lateral flexion.  EXTREMITIES:  Deep tendon reflexes were symmetric in the upper extremity.  IMPRESSION 1. Atypical facial pain. 2. Headaches on the basis of cervicogenic headaches with cervical spondylosis. 3. Other medical problems per Dr. Sudie Bailey.  DISPOSITION 1. The patients headaches have improved with the corticosteroids, which    always brings up the possibility of an underlying inflammatory process.  I    am uncertain whether she had a sedimentation rate drawn before she went on    prednisone, but it might be helpful at some point to repeat this to find    out whether she has any potential inflammatory process causing this. 2. Increase Zanaflex to 4 mg, 2-1/2 tablets 3 times a day; she was given a    prescription for  1800 tablets. 3. Riboflavin 400 mg per day. 4. Follow up with me in three months.  The patient was encouraged to let us    know how she is doing.  I would recommend that if her headaches do not seem    to stabilize, that she have more laboratory investigations to assess these    more completely and she may actually need to be seen by the [facility].DD:  04/13/00 TD:  04/13/00 Job: [id number]

## 2010-10-25 NOTE — Procedures (Signed)
[facility]  Patient:    [name], [name]                      MRN: [id number] Proc. Date: 10/10/99 Adm. Date:  [id number] Attending:  Thyra Breed CC:         John Giovanni, M.D., [place], [place]             Stefani Dama, M.D.                           Procedure Report  HISTORY:  The patient is a 70 year old who is sent to [place] by Dr. Stefani Dama for trigger point injections.  The patient is a 70 year old who has a long history of neck discomfort.  She stated that she "had a broken neck at delivery" and has had neck pain since she was a child.  She has been evaluated at [facility] and [facility] for this as a child and eventually was seen by Dr. Clide Cliff D. Holmberg in the 1980s and underwent fusions at multiple levels.  She did remarkably well for four to five years.  Unfortunately, approximately 10 years ago, she fell with her right arm extended and "jammed" her right shoulder.  Shortly after this, she developed neck discomfort with associated occipital-localized headaches which radiate forward over the temporal areas to he eyes.  She has had retro-orbital headaches since then.  She was reevaluated by Dr. Gasper Sells and Dr. Danielle Dess recently and sent for injections.  She has been treated by Dr. Clabe Seal. Adelman in the past and is currently being seen by Dr. John Giovanni and being treated with p.r.n. Demerol.  The patient describes pain in the back of her head radiating forward into her eyes and out into her shoulders.  She describes it as a "hurting" sharp pain.  She does not identify ny exacerbating features or improving features.  She has tried ice and heat with no improvement, as well as medications.  She does not feel as though the Demerol is very helpful.  She also had migraine headaches and is on the Demerol for this as well as Effexor.  She has never had a TENS unit or an Alpha micro-current stimulation device.  She occasionally has numbness  into the left upper extremity but no bowel or bladder incontinence or weakness.  She is sent today for occipital nerve blocks.  CURRENT MEDICATIONS:  Tolbutamide, Glucophage, Reglan, Premarin, Prevacid, Lipitor, Effexor and Demerol.  ALLERGIES:  PENICILLIN, CODEINE and ERYTHROMYCIN.  FAMILY HISTORY:  Positive for coronary artery disease, throat cancer, diabetes nd hypertension.  PAST SURGICAL HISTORY:  Significant for tonsillectomy, hysterectomy, hiatal hernia surgery and her neck surgery, as mentioned above.  SOCIAL HISTORY:  The patient is a nonsmoker, nondrinker.  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  She has been an Brewing technologist homes in the past.  ACTIVE MEDICAL PROBLEMS: 1. Non-insulin-dependent diabetes mellitus not associated with any kidney, nerve or    eye problems. 2. Migraine headaches. 3. Peptic ulcer disease/gastroesophageal reflux disease. 4. Osteoarthritis.  REVIEW OF SYSTEMS:  GENERAL:  Negative.  HEAD:  See active medical problems. EYES: Significant for retro-orbital discomfort but otherwise negative except for presbyopia.  NOSE, MOUTH AND THROAT:  Negative.  EARS:  Significant for ear pain secondary to her headaches.  PULMONARY:  Negative.  CARDIOVASCULAR:  Negative. GI: See active medical problems.  GU:  Negative.  MUSCULOSKELETAL:  See active medical problems and HPI.  NEUROLOGIC:  See HPI.  No history of seizure or stroke. HEMATOLOGIC:  Positive for history of anemia in the past.  CUTANEOUS: Negative. ENDOCRINE:  Positive for diabetes since 1995.  PSYCHIATRIC:  Negative. ALLERGY/IMMUNOLOGIC:  Positive for hayfever.  PHYSICAL EXAMINATION:  VITAL SIGNS:  Blood pressure is 157/82, heart rate 67, respiratory rate 20, O2 saturation is 96%, pain level is 5/10 and temperature is 97.7.  GENERAL:  This is a pleasant female in no acute distress.  HEENT:  Head was normocephalic, atraumatic, with tenderness over the  greater and lesser occipital grooves bilaterally.  Eyes:  Extraocular movements intact with  conjunctivae and sclerae clear.  Nose:  Patent nares.  Oropharynx is free of lesions.  Of note, on hyperextension of her head, she feels as though the room s fading and she has darkening of her vision.  NECK:  She has good range of motion of her neck.  Carotids are 2+ and symmetric. There is no lymphadenopathy.  LUNGS:  Clear.  HEART:  Regular rate and rhythm.  ABDOMEN:  Bowel sounds are present.  BREASTS:  Not performed.  GENITALIA:  Not performed.  RECTAL:  Not performed.  BACK:  No tenderness to percussion of the vertebrae.  EXTREMITIES:  Patient demonstrated prominent bony enlargement of the PIPs, DIPs and first carpometacarpal joints of the hands and mild bony enlargement of the first MTPs.  Radial pulse and dorsalis pedis pulses were 2+ and symmetric.  NEUROLOGIC:  The patient was oriented x 4.  Cranial nerves II-XII were grossly intact.  DTRs were symmetric in the upper and lower extremities with downgoing toes.  Motor was 5/5 with symmetric bulk and tone.  Sensation was intact to pinprick, light touch and vibratory sense.  Coordination was grossly intact.  IMAGING STUDIES:  A myelogram from August 30, 1999 was sent to [place] which showed a  bony fusion from C1 through C3, intact, and C6-7 foraminal narrowing secondary o lumbar spondylosis, predominantly to the right.  IMPRESSION: 1. Headaches which radiate from the neck forward, most likely on the basis of her    cervical spondylosis with element of occipital neuralgia. 2. Cervical spondylosis. 3. Diabetes mellitus, per primary care physician. 4. Gastroesophageal reflux disease, per primary care physician. 5. Headaches which include migraine and tension headache complex in addition to    cervicogenic headaches. 6. Osteoarthritis, predominantly involving the small joints of the hands and feet.  DISPOSITION:  I  discussed the potential risks and benefits of occipital nerve blocks; she wishes to proceed with these.   DESCRIPTION OF PROCEDURE:  After informed consent was obtained, I prepped out the areas of greatest tenderness and injected each site with 2 cc of local anesthetic. The local anesthetic consisted of 3 cc of 1% lidocaine with 6 cc of 0.5% levobupivacaine.  The needle was removed intact.  POST-PROCEDURE CONDITION:  Stable with marked decreased pain in her neck and head but ongoing pain over her face.  DISPOSITION: 1. Continue on current medications. 2. Limitations and activities per instruction sheet. 3. Resume previous medications. 4. Follow up with me in one week for repeat injections. DD:  10/10/99 TD:  10/14/99 Job: [id number]

## 2010-10-25 NOTE — H&P (Signed)
[facility]  Patient:    [name], [name]                      MRN: [id number] Adm. Date:  [id number] Attending:  Thyra Breed CC:         Dr. Darlyn Read, M.D.                         History and Physical  CHIEF COMPLAINT:  This is a follow-up evaluation.  Merelin comes in for a follow-up evaluation of her cervicogenic headaches and atypical facial pain.  Since her previous evaluation, the patient has noted a modest improvement with the Zanaflex. She is taking 24 mg q.d.  She does rate her pain at 5/10.  She is not able to identify what makes her pain worse or better.  She has intermittent flare-ups of the pain.  She recalls being tried on morphine long-acting in the past, and getting about two hours worth of benefit.  She does not feel as though the Ultram is helping her.  CURRENT MEDICATIONS:  She continues on Glucophage, Reglan, Prevacid, Premarin, Lipitor, Effexor, and she is taking the Ultram p.r.n.  She is also on tolbutamide.  PHYSICAL EXAMINATION:  VITAL SIGNS:  Blood pressure 150/80, heart rate 82, respirations 16, O2 saturation 97%.  Pain level is 5/10.  Temperature 97.2 degrees.  NEUROLOGIC:  She has modest restriction of range of motion of her neck, with the worst pain on forward flexion.  Deep tendon reflexes are symmetric in the upper  extremities today.  She continues to have degenerative changes of her hands. She has tenderness over the occipital regions bilaterally.  Dr. Dionicia Abler last note brings up the question of a diabetic neuropathy causing the facial numbness, and I feel that he may have a very valid point with regard to this.  IMPRESSION: 1. Headaches and atypical facial pain, which may be a combination of    cervicogenic headaches and diabetic neuropathy. 2. Cervical spondylosis. 3. Other medical problems per Dr. Sudie Bailey.  DISPOSITION: 1. Continue on Zanaflex 4 mg two tablets  t.i.d. #180, with one refill,    and a prescription for 540 to be sent. 2. Trial of MS Contin 15 mg one p.o. t.i.d. #90, with no refill.  She is    to call in five days to let us know whether she is responding to this,    and whether we need to increase it to two b.i.d. 3. Follow up with [place] in four weeks. DD:  12/12/99 TD:  12/12/99 Job: [id number]

## 2010-10-25 NOTE — H&P (Signed)
[facility]  Patient:    [name], [name]                      MRN: [id number] Adm. Date:  [id number] Attending:  Thyra Breed CC:         Dr. 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  Since her last evaluation, the patient has noted that her headaches are improved to the point that they are tolerable.  She is off the OxyContin and does not wish to return to opiates.  The Zanaflex seems to be holding her, although she is noting some breakthrough about two to three hours before she takes the next dose.  She rates her pain at 5 out of 10 today.  PHYSICAL EXAMINATION:  VITAL SIGNS:  Blood pressure 138/82, heart rate 72, respiratory rate 16, O2 saturation 96%.  Pain level is 5 out of 10.  NECK:  Range of motion is unchanged from previously.  NEUROLOGIC:  Grossly unchanged.  She is in a much more relaxed disposition today.  IMPRESSION: 1. Atypical facial pain and headaches, which may be reflective of cervicogenic    headaches from her cervical spondylosis. 2. Cervical spondylosis. 3. Other medical problems per Dr. Sudie Bailey.  DISPOSITION: 1. Increase Zanaflex to 4 mg 2-1/2 tablets three times a day.  She was given    prescription for 675. 2. Wean off opiates. 3. Follow up with me in three months. DD:  01/13/00 TD:  01/13/00 Job: [id number]

## 2010-10-25 NOTE — Op Note (Signed)
[facility]  Patient:    [name], [name]                      MRN: [id number] Proc. Date: 10/18/99 Adm. Date:  [id number] Attending:  Thyra Breed CC:         Stefani Dama, M.D.             Dr. Sudie Bailey                           Operative Report  PROCEDURE:  Bilateral occipital nerve block.  DIAGNOSIS:  Headaches with underlying cervical spondylosis and element of occipital neuralgia.  INTERVAL HISTORY:  The patient feels better overall.  She rates her pain at 4-5 out of 10, which is a mild improvement from her last visit.  She in general feels much improved.  EXAMINATION:  VITAL SIGNS:  Blood pressure 187/94, heart rate 63, respiratory rate 14, O2 saturations 99%, pain level 4-5 out of 10, temperature 97.1.  GENERAL:  Her occipital region shows good healing from her previous injection sites.  NEUROLOGIC:  Grossly unchanged.  DESCRIPTION OF PROCEDURE:  The areas of greatest tenderness over the occipital grooves were identified at the lesser occipital groove on the right side, and the greater and lesser occipital grooves on the left side.  Marks were made over the skin.  The skin was prepped with Betadine x 3.  I injected 2 cc of local anesthetic, which was a combination of 3 cc of 1% lidocaine with 6 cc of 0.5% levobupivacaine, with 40 mg of Medrol.  She got approximately 24 mg of Medrol.  POSTPROCEDURE CONDITION:  Improved with less pain.  DISPOSITION: 1. Continue on current medications. 2. Limitation of activities, per instruction sheet. 3. Follow up with me in one week for third occipital nerve block in a series. DD:  10/18/99 TD:  10/22/99 Job: [id number] NF/[id number]

## 2010-10-29 ENCOUNTER — Other Ambulatory Visit (HOSPITAL_COMMUNITY): Payer: Self-pay | Admitting: Family Medicine

## 2010-10-29 DIAGNOSIS — Z139 Encounter for screening, unspecified: Secondary | ICD-10-CM

## 2010-11-01 ENCOUNTER — Ambulatory Visit (HOSPITAL_COMMUNITY)
Admission: RE | Admit: 2010-11-01 | Discharge: 2010-11-01 | Disposition: A | Discharge status: Home / Self Care | Payer: Medicare Other | Source: Ambulatory Visit | Attending: Family Medicine | Admitting: Family Medicine

## 2010-11-01 DIAGNOSIS — Z780 Asymptomatic menopausal state: Secondary | ICD-10-CM | POA: Insufficient documentation

## 2010-11-01 DIAGNOSIS — M899 Disorder of bone, unspecified: Secondary | ICD-10-CM | POA: Insufficient documentation

## 2010-11-01 DIAGNOSIS — Z139 Encounter for screening, unspecified: Secondary | ICD-10-CM

## 2010-11-04 NOTE — Op Note (Signed)
NAMEJOSEPH, [name]               ACCOUNT NO.:  [id number]  MEDICAL RECORD NO.:  [id number]           PATIENT TYPE:  O  LOCATION:  DAYP                          FACILITY:  [facility]  PHYSICIAN:  Lionel December, M.D.    DATE OF BIRTH:  [birth year]  DATE OF PROCEDURE:  10/11/2010 DATE OF DISCHARGE:                              OPERATIVE REPORT   PROCEDURE:  Esophagogastroduodenoscopy with esophageal dilation.  INDICATION:  [name] is 70 year old Caucasian female who presents with recurrent solid food dysphagia.  At times, she has difficulty with liquids.  She has had her esophagus dilated in the past with some benefit.  She had barium study last week.  This suggests esophageal motility disorder, but she has narrowing at the GE junction and 13 mm barium pill got transiently held at this area.  She has had antireflux surgery.  She also has diabetic gastroparesis with fair control of her symptoms.  She could have developed esophagitis and/or esophageal stricture.  She is undergoing diagnostic/therapeutic EGD.  Procedures were reviewed with the patient.  Informed consent was obtained.  MEDS FOR CONSCIOUS SEDATION:  Cetacaine spray for oropharyngeal topical anesthesia, Demerol 50 mg IV, Versed 4 mg IV in divided dose.  FINDINGS:  Procedure performed in endoscopy suite.  The patient's vital signs and O2 sat were monitored during the procedure and remained stable.  The patient was placed in left lateral recumbent position and Pentax videoscope was passed through oropharynx without any difficulty into esophagus.  Esophagus.  Mucosa of the esophagus was normal.  Body was somewhat dilated, but there was no food debris.  GE junction was located at 34 cm from the incisors and was unremarkable.  Hiatus was at 36 and was wide open.  Stomach.  It had moderate amount of food debris in it.  Part of the stomach that was seen was normal.  Pyloric channel was patent. Angularis was unremarkable.  Scope was  retroflexed to examine, fundus and cardia and the wrap was essentially undone or very loose.  Duodenum.  Bulbar mucosa was normal.  Scope was passed in second part of duodenum where mucosa and folds were normal.  Esophageal dilation was performed using a balloon dilator.  The balloon dilator was passed with the scope.  Guidewire pushed in the gastric lumen.  Balloon was positioned across GE junction by withdrawing the scope and body of esophagus.  Balloon was initially insufflated to diameter 18 mm, followed by 19, and finally to 20 mm and maintained for about a minute.  It was then pushed distally.  No mucosal disruption noted.  Balloon was deflated and withdrawn.  Endoscope was also withdrawn.  The patient tolerated the procedure well.  FINAL DIAGNOSES: 1. No evidence of erosive esophagitis, stricture, or ring. 2. A 2-cm size sliding hiatal hernia. 3. Fundal wrap essentially undone or loose. 4. Food debris in the stomach secondary to underlying gastroparesis.     Patent pylorus. 5. Gastroesophageal junction was dilated with a balloon to 20 mm     without causing mucosal disruption. 6. I suspect not only she has gastroparesis, she also has esophageal  motility disorder.  RECOMMENDATIONS: 1. Antireflux measures reinforced.  She needs to be adherent to     gastroparesis diet.  We will given her instructions. 2. She should continue erythromycin at 100 mg 3 times a day and     decrease her metoclopramide to 15 mg before each meal and hopefully     around next visit, I will drop the dose to 30 mg per day. 3. She will resume her Plavix today.  She will return for OV in 8     weeks.          ______________________________ Lionel December, M.D.     NR/MEDQ  D:  10/11/2010  T:  10/11/2010  Job:  [id number]  cc:   Mila Homer. Sudie Bailey, M.D. Fax: [phone]  Electronically Signed by Lionel December M.D. on 11/04/2010 05:39:36 PM

## 2010-12-03 ENCOUNTER — Ambulatory Visit (INDEPENDENT_AMBULATORY_CARE_PROVIDER_SITE_OTHER): Payer: Medicare Other | Admitting: Internal Medicine

## 2010-12-03 DIAGNOSIS — R1013 Epigastric pain: Secondary | ICD-10-CM

## 2010-12-03 DIAGNOSIS — K219 Gastro-esophageal reflux disease without esophagitis: Secondary | ICD-10-CM

## 2010-12-03 DIAGNOSIS — K3189 Other diseases of stomach and duodenum: Secondary | ICD-10-CM

## 2011-01-23 ENCOUNTER — Encounter (INDEPENDENT_AMBULATORY_CARE_PROVIDER_SITE_OTHER): Payer: Self-pay

## 2011-03-11 ENCOUNTER — Other Ambulatory Visit: Payer: Self-pay

## 2011-03-11 ENCOUNTER — Emergency Department (HOSPITAL_COMMUNITY): Payer: Medicare Other

## 2011-03-11 ENCOUNTER — Encounter (HOSPITAL_COMMUNITY): Payer: Self-pay | Admitting: *Deleted

## 2011-03-11 ENCOUNTER — Emergency Department (HOSPITAL_COMMUNITY)
Admission: EM | Admit: 2011-03-11 | Discharge: 2011-03-11 | Disposition: A | Discharge status: Home / Self Care | Payer: Medicare Other | Attending: Emergency Medicine | Admitting: Emergency Medicine

## 2011-03-11 DIAGNOSIS — M542 Cervicalgia: Secondary | ICD-10-CM | POA: Insufficient documentation

## 2011-03-11 DIAGNOSIS — R4182 Altered mental status, unspecified: Secondary | ICD-10-CM | POA: Insufficient documentation

## 2011-03-11 DIAGNOSIS — R55 Syncope and collapse: Secondary | ICD-10-CM

## 2011-03-11 DIAGNOSIS — R059 Cough, unspecified: Secondary | ICD-10-CM | POA: Insufficient documentation

## 2011-03-11 DIAGNOSIS — R42 Dizziness and giddiness: Secondary | ICD-10-CM | POA: Insufficient documentation

## 2011-03-11 DIAGNOSIS — M549 Dorsalgia, unspecified: Secondary | ICD-10-CM | POA: Insufficient documentation

## 2011-03-11 DIAGNOSIS — Z79899 Other long term (current) drug therapy: Secondary | ICD-10-CM | POA: Insufficient documentation

## 2011-03-11 DIAGNOSIS — F29 Unspecified psychosis not due to a substance or known physiological condition: Secondary | ICD-10-CM | POA: Insufficient documentation

## 2011-03-11 DIAGNOSIS — E119 Type 2 diabetes mellitus without complications: Secondary | ICD-10-CM | POA: Insufficient documentation

## 2011-03-11 DIAGNOSIS — K219 Gastro-esophageal reflux disease without esophagitis: Secondary | ICD-10-CM | POA: Insufficient documentation

## 2011-03-11 DIAGNOSIS — R05 Cough: Secondary | ICD-10-CM

## 2011-03-11 HISTORY — DX: Transient cerebral ischemic attack, unspecified: G45.9

## 2011-03-11 LAB — COMPREHENSIVE METABOLIC PANEL
ALT: 11 U/L (ref 0–35)
AST: 12 U/L (ref 0–37)
Albumin: 3.3 g/dL — ABNORMAL LOW (ref 3.5–5.2)
Alkaline Phosphatase: 71 U/L (ref 39–117)
BUN: 8 mg/dL (ref 6–23)
CO2: 28 mEq/L (ref 19–32)
Calcium: 9.3 mg/dL (ref 8.4–10.5)
Chloride: 105 mEq/L (ref 96–112)
Creatinine, Ser: 0.64 mg/dL (ref 0.50–1.10)
GFR calc Af Amer: >90 mL/min (ref >90)
GFR calc non Af Amer: 89 mL/min — ABNORMAL LOW (ref >90)
Glucose, Bld: 126 mg/dL — ABNORMAL HIGH (ref 70–99)
Potassium: 3.7 mEq/L (ref 3.5–5.1)
Sodium: 141 mEq/L (ref 135–145)
Total Bilirubin: 0.2 mg/dL — ABNORMAL LOW (ref 0.3–1.2)
Total Protein: 6.7 g/dL (ref 6.0–8.3)

## 2011-03-11 LAB — DIFFERENTIAL
Basophils Absolute: 0 10*3/uL (ref 0.0–0.1)
Basophils Relative: 1 % (ref 0–1)
Eosinophils Absolute: 0.4 10*3/uL (ref 0.0–0.7)
Eosinophils Relative: 5 % (ref 0–5)
Lymphocytes Relative: 23 % (ref 12–46)
Lymphs Abs: 1.7 10*3/uL (ref 0.7–4.0)
Monocytes Absolute: 0.5 10*3/uL (ref 0.1–1.0)
Monocytes Relative: 7 % (ref 3–12)
Neutro Abs: 4.9 10*3/uL (ref 1.7–7.7)
Neutrophils Relative %: 64 % (ref 43–77)

## 2011-03-11 LAB — CBC
HCT: 34.6 % — ABNORMAL LOW (ref 36.0–46.0)
Hemoglobin: 10.9 g/dL — ABNORMAL LOW (ref 12.0–15.0)
MCH: 28.8 pg (ref 26.0–34.0)
MCHC: 31.5 g/dL (ref 30.0–36.0)
MCV: 91.3 fL (ref 78.0–100.0)
Platelets: 190 10*3/uL (ref 150–400)
RBC: 3.79 MIL/uL — ABNORMAL LOW (ref 3.87–5.11)
RDW: 13.8 % (ref 11.5–15.5)
WBC: 7.5 10*3/uL (ref 4.0–10.5)

## 2011-03-11 LAB — PROTIME-INR
INR: 0.96 (ref 0.00–1.49)
Prothrombin Time: 13 seconds (ref 11.6–15.2)

## 2011-03-11 LAB — URINALYSIS, ROUTINE W REFLEX MICROSCOPIC
Bilirubin Urine: NEGATIVE
Glucose, UA: NEGATIVE mg/dL
Hgb urine dipstick: NEGATIVE
Ketones, ur: NEGATIVE mg/dL
Leukocytes, UA: NEGATIVE
Nitrite: NEGATIVE
Protein, ur: NEGATIVE mg/dL
Specific Gravity, Urine: <1.005 — ABNORMAL LOW (ref 1.005–1.030)
Urobilinogen, UA: 0.2 mg/dL (ref 0.0–1.0)
pH: 6.5 (ref 5.0–8.0)

## 2011-03-11 LAB — POCT I-STAT TROPONIN I: Troponin i, poc: 0 ng/mL (ref 0.00–0.08)

## 2011-03-11 MED ORDER — BENZONATATE 100 MG PO CAPS: 100.0000 mg | ORAL_CAPSULE | Freq: Three times a day (TID) | ORAL | Status: AC

## 2011-03-11 NOTE — ED Notes (Signed)
Returned from CT.

## 2011-03-11 NOTE — ED Notes (Signed)
Placed on monitor HR 66 SR, B/P  137/56  R 20  P.O. 100% on room air.

## 2011-03-11 NOTE — ED Notes (Signed)
B/P 141/60  HR 66  R 20 P/O. 97%---No change in level of consciousness--alert and oriented---Family member remains at bedside---requesting and given drink of water--Tolerating well with intact gag.

## 2011-03-11 NOTE — ED Provider Notes (Addendum)
History  Scribed for Dr. Brooke Dare, the patient was seen in room APA06. The chart was scribed by Gilman Schmidt. The patients care was started at 11:15. CSN: [id number] Arrival date & time: 03/11/2011 11:00 AM  Chief Complaint  Patient presents with  . Altered Mental Status   HPI [name] is a 70 y.o. female who presents to the Emergency Department complaining of Altered Mental Status. Pt states she has been having memory loss since last night. Pt additionally notes having episodes of dizziness (feeling as if she would pass out) and being unable to focus("not being able to tell where everything is"). Per sister, pt seemed and sounded normal yesterday. Pt additionally notes chronic face, neck, head, and shoulder pain. Pt was referred to the ED by PCP. There are no other associated symptoms and no other alleviating or aggravating factors.   PAST MEDICAL HISTORY:  Past Medical History  Diagnosis Date  . Nausea   . Bloating   . Dysphagia   . Gastroesophageal reflux   . Gastroparesis   . Anemia   . Diabetes mellitus   . TIA (transient ischemic attack)      PAST SURGICAL HISTORY:  Past Surgical History  Procedure Date  . Hemicolectomy ZIEGLER  . Colonoscopy 10/11/2010  . Colonoscopy 11/23/2009  . Colonoscopy 07/28/2008    W/SNARE  . Colonoscopy 06/28/07  . Colonoscopy 05/10/07    W/POLYP  . Colonoscopy 12/28/00  . Upper gastrointestinal endoscopy 10/11/2010    EGD ED  . Upper gastrointestinal endoscopy 11/23/2009  . Upper gastrointestinal endoscopy 05/10/07    EGD ED  . Upper gastrointestinal endoscopy 08/11/01    EGD ED  . Cervical spine surgery      MEDICATIONS:  Previous Medications   ATORVASTATIN (LIPITOR) 10 MG TABLET    Take 10 mg by mouth daily.     BACLOFEN (LIORESAL) 10 MG TABLET    Take 10 mg by mouth 3 (three) times daily.     CALCIUM-VITAMIN D (OSCAL WITH D) 250-125 MG-UNIT PER TABLET    Take 1 tablet by mouth daily.     CLOPIDOGREL (PLAVIX) 75 MG TABLET    Take  75 mg by mouth daily.     CYCLOBENZAPRINE (FLEXERIL) 10 MG TABLET    Take 10 mg by mouth 3 (three) times daily as needed. For muscle pain   DULOXETINE (CYMBALTA) 60 MG CAPSULE    Take 60 mg by mouth daily.     ENALAPRIL (VASOTEC) 5 MG TABLET    Take 5 mg by mouth daily.     ERYTHROMYCIN (E-MYCIN) 250 MG TABLET    Take by mouth 4 (four) times daily.     FENTANYL (DURAGESIC - DOSED MCG/HR) 50 MCG/HR    Place 1 patch onto the skin every 3 (three) days.     LANSOPRAZOLE (PREVACID) 30 MG CAPSULE    Take 30 mg by mouth daily.     LATANOPROST (XALATAN) 0.005 % OPHTHALMIC SOLUTION    Place 1 drop into both eyes at bedtime.    METFORMIN (GLUCOPHAGE) 1000 MG TABLET    Take 1,000 mg by mouth 2 (two) times daily.     METFORMIN (GLUMETZA) 1000 MG (MOD) 24 HR TABLET    Take 1,000 mg by mouth daily with breakfast.     METOCLOPRAMIDE (REGLAN) 10 MG TABLET    Take 10-15 mg by mouth 3 (three) times daily. Takes 10 mg in the morning and at bedtime and 15 mg in the afternoon   TEMAZEPAM (  RESTORIL) 22.5 MG CAPSULE    Take 22.5 mg by mouth at bedtime as needed. For sleep     ALLERGIES:  Allergies as of 03/11/2011 - Review Complete 03/11/2011  Allergen Reaction Noted  . Codeine Nausea And Vomiting   . Morphine Other (See Comments)   . Shellfish allergy Other (See Comments) 03/11/2011  . Penicillins Rash      FAMILY HISTORY:  History reviewed. No pertinent family history.   SOCIAL HISTORY: History  Substance Use Topics  . Smoking status: Former Games developer  . Smokeless tobacco: Not on file  . Alcohol Use: No   Review of Systems  HENT: Positive for neck pain.   Musculoskeletal: Positive for back pain.       Shoulder pain Neck pain   Neurological: Positive for dizziness.  Psychiatric/Behavioral: Positive for confusion.  All other systems reviewed and are negative.     Allergies  Codeine; Morphine; Shellfish allergy; and Penicillins  Home Medications   Current Outpatient Rx  Name Route Sig Dispense  Refill  . BACLOFEN 10 MG PO TABS Oral Take 10 mg by mouth 3 (three) times daily.      Marland Kitchen CALCIUM CARBONATE-VITAMIN D 250-125 MG-UNIT PO TABS Oral Take 1 tablet by mouth daily.      Marland Kitchen CLOPIDOGREL BISULFATE 75 MG PO TABS Oral Take 75 mg by mouth daily.      . CYCLOBENZAPRINE HCL 10 MG PO TABS Oral Take 10 mg by mouth 3 (three) times daily as needed. For muscle pain    . DULOXETINE HCL 60 MG PO CPEP Oral Take 60 mg by mouth daily.      . ENALAPRIL MALEATE 5 MG PO TABS Oral Take 5 mg by mouth daily.      . ERYTHROMYCIN BASE 250 MG PO TABS Oral Take by mouth 4 (four) times daily.      . FENTANYL 50 MCG/HR TD PT72 Transdermal Place 1 patch onto the skin every 3 (three) days.      Marland Kitchen LATANOPROST 0.005 % OP SOLN Both Eyes Place 1 drop into both eyes at bedtime.     Marland Kitchen METFORMIN HCL 1000 MG PO TABS Oral Take 1,000 mg by mouth 2 (two) times daily.      Marland Kitchen METOCLOPRAMIDE HCL 10 MG PO TABS Oral Take 10-15 mg by mouth 3 (three) times daily. Takes 10 mg in the morning and at bedtime and 15 mg in the afternoon    . TEMAZEPAM 22.5 MG PO CAPS Oral Take 22.5 mg by mouth at bedtime as needed. For sleep    . ATORVASTATIN CALCIUM 10 MG PO TABS Oral Take 10 mg by mouth daily.      Marland Kitchen BENZONATATE 100 MG PO CAPS Oral Take 1 capsule (100 mg total) by mouth every 8 (eight) hours. 21 capsule 0  . LANSOPRAZOLE 30 MG PO CPDR Oral Take 30 mg by mouth daily.      Marland Kitchen METFORMIN HCL 1000 MG (MOD) PO TB24 Oral Take 1,000 mg by mouth daily with breakfast.        BP 152/63  Pulse 71  Temp(Src) 99.2 F (37.3 C) (Oral)  Resp 17  Ht 5\' 1"  (1.549 m)  Wt 140 lb (63.504 kg)  BMI 26.45 kg/m2  SpO2 100%  Physical Exam  Constitutional: She is oriented to person, place, and time. She appears well-developed and well-nourished.  Non-toxic appearance. She does not have a sickly appearance.  HENT:  Head: Normocephalic and atraumatic.  Eyes: Conjunctivae, EOM and lids  are normal. Pupils are equal, round, and reactive to light. No scleral  icterus.  Neck: Trachea normal and normal range of motion. Neck supple.  Cardiovascular: Regular rhythm and normal heart sounds.   Pulmonary/Chest: Effort normal and breath sounds normal.  Abdominal: Soft. Normal appearance. There is no tenderness. There is no rebound, no guarding and no CVA tenderness.  Musculoskeletal: Normal range of motion.  Neurological: She is alert and oriented to person, place, and time. She has normal strength.  Skin: Skin is warm, dry and intact. No rash noted.     ED Course  Procedures  OTHER DATA REVIEWED: Nursing notes, vital signs, and past medical records reviewed.  DIAGNOSTIC STUDIES: Oxygen Saturation is 100% on room air, normal by my interpretation.    LABS:  Results for orders placed during the hospital encounter of 03/11/11  CBC      Component Value Range   WBC 7.5  4.0 - 10.5 (K/uL)   RBC 3.79 (*) 3.87 - 5.11 (MIL/uL)   Hemoglobin 10.9 (*) 12.0 - 15.0 (g/dL)   HCT 14.7 (*) 82.9 - 46.0 (%)   MCV 91.3  78.0 - 100.0 (fL)   MCH 28.8  26.0 - 34.0 (pg)   MCHC 31.5  30.0 - 36.0 (g/dL)   RDW 56.2  13.0 - 86.5 (%)   Platelets 190  150 - 400 (K/uL)  DIFFERENTIAL      Component Value Range   Neutrophils Relative 64  43 - 77 (%)   Neutro Abs 4.9  1.7 - 7.7 (K/uL)   Lymphocytes Relative 23  12 - 46 (%)   Lymphs Abs 1.7  0.7 - 4.0 (K/uL)   Monocytes Relative 7  3 - 12 (%)   Monocytes Absolute 0.5  0.1 - 1.0 (K/uL)   Eosinophils Relative 5  0 - 5 (%)   Eosinophils Absolute 0.4  0.0 - 0.7 (K/uL)   Basophils Relative 1  0 - 1 (%)   Basophils Absolute 0.0  0.0 - 0.1 (K/uL)  COMPREHENSIVE METABOLIC PANEL      Component Value Range   Sodium 141  135 - 145 (mEq/L)   Potassium 3.7  3.5 - 5.1 (mEq/L)   Chloride 105  96 - 112 (mEq/L)   CO2 28  19 - 32 (mEq/L)   Glucose, Bld 126 (*) 70 - 99 (mg/dL)   BUN 8  6 - 23 (mg/dL)   Creatinine, Ser 7.84  0.50 - 1.10 (mg/dL)   Calcium 9.3  8.4 - 69.6 (mg/dL)   Total Protein 6.7  6.0 - 8.3 (g/dL)   Albumin  3.3 (*) 3.5 - 5.2 (g/dL)   AST 12  0 - 37 (U/L)   ALT 11  0 - 35 (U/L)   Alkaline Phosphatase 71  39 - 117 (U/L)   Total Bilirubin 0.2 (*) 0.3 - 1.2 (mg/dL)   GFR calc non Af Amer 89 (*) >90 (mL/min)   GFR calc Af Amer >90  >90 (mL/min)  PROTIME-INR      Component Value Range   Prothrombin Time 13.0  11.6 - 15.2 (seconds)   INR 0.96  0.00 - 1.49   URINALYSIS, ROUTINE W REFLEX MICROSCOPIC      Component Value Range   Color, Urine YELLOW  YELLOW    Appearance CLEAR  CLEAR    Specific Gravity, Urine <1.005 (*) 1.005 - 1.030    pH 6.5  5.0 - 8.0    Glucose, UA NEGATIVE  NEGATIVE (mg/dL)   Hgb urine  dipstick NEGATIVE  NEGATIVE    Bilirubin Urine NEGATIVE  NEGATIVE    Ketones, ur NEGATIVE  NEGATIVE (mg/dL)   Protein, ur NEGATIVE  NEGATIVE (mg/dL)   Urobilinogen, UA 0.2  0.0 - 1.0 (mg/dL)   Nitrite NEGATIVE  NEGATIVE    Leukocytes, UA NEGATIVE  NEGATIVE   POCT I-STAT TROPONIN I      Component Value Range   Troponin i, poc 0.00  0.00 - 0.08 (ng/mL)   Comment 3             Date: 03/11/2011  Rate: 64  Rhythm: normal sinus rhythm  QRS Axis: normal  Intervals: normal  ST/T Wave abnormalities: normal  Conduction Disutrbances:none  Narrative Interpretation:   Old EKG Reviewed: unchanged  RADIOLOGY:  CT Head Wo Contrast. Reviewed by me. IMPRESSION: Atrophy with small vessel chronic ischemic changes of deep cerebral white matter. No acute intracranial abnormalities. Original Report Authenticated By: Lollie Marrow, M.D.   DG Chest 2 View. Reviewed by me. IMPRESSION: Mild chronic central airway thickening. No acute cardiopulmonary process. Original Report Authenticated By: Gerrianne Scale, M.D.   MDM: Laboratory studies were obtained and reviewed. The results were relatively unremarkable and the patient's baseline. She is on multiple medications which can result in her overall presentation. A CT and chest x-ray are also relatively unremarkable. I'll provided to patient  Tessalon Perles to treat her cough. I discussed the case with her primary care physician Dr Sudie Bailey. We discussed the laboratory and radiology findings. It was determined that nothing abnormal was identified this to be safe for discharge home with instructions to followup with him later on in the week. There no findings to suggest pneumonia. There is no urinary tract infection. There is no other etiology to suggest her symptoms. She is encouraged to continue aggressive hydration at home. She is provided signs and symptoms for which returned emergency department  IMPRESSION: Diagnoses that have been ruled out:  Diagnoses that are still under consideration:  Final diagnoses:  Near syncope  Cough    PLAN:  Home Advised to return for worsening or additional problems such as abdominal or chest pain Diagnostic tests were reviewed and questions answered. Diagnosis, care plan and treatment options were discussed. The patient understand instructions and will follow up as directed. Condition improved The patient was given verbal near syncope instructions The patient is to return the emergency department if there is any worsening of symptoms. I have reviewed the discharge instructions with the patient and family  CONDITION ON DISCHARGE: Good  MEDICATIONS GIVEN IN THE E.D.  Medications  metFORMIN (GLUCOPHAGE) 1000 MG tablet (not administered)  fentaNYL (DURAGESIC - DOSED MCG/HR) 50 MCG/HR (not administered)  benzonatate (TESSALON) 100 MG capsule (not administered)    DISCHARGE MEDICATIONS: New Prescriptions   BENZONATATE (TESSALON) 100 MG CAPSULE    Take 1 capsule (100 mg total) by mouth every 8 (eight) hours.    SCRIBE ATTESTATION: I personally performed the services described in this documentation, which was scribed in my presence. The recorded information has been reviewed and considered.             Dayton Bailiff, MD 03/11/11 1419  Dayton Bailiff, MD 03/11/11 1500

## 2011-03-11 NOTE — ED Notes (Signed)
Taken to CT via carrier--No change in status.

## 2011-03-11 NOTE — ED Notes (Signed)
Pt states she is having memory loss starting last pm. Pt states she thinks her grandson put her to bed last night but is not sure. pts states sx started last pm. pts sister states she talked to pt at lunch yesterday and pt sounded normal.

## 2011-03-11 NOTE — ED Notes (Signed)
C/o generalized achiness, states "it feels like I hurt deep within my bones."  Noticed she has a Fentanyl patch in place on her right upper back area.

## 2011-03-11 NOTE — ED Notes (Signed)
Pt. Is alert and oriented to person, place and time--states "I feel strange--it is hard to describe but I do not feel normal."  Reports she is able to recall all the events of yesterday up until last p.m.--sent tobed around 8 last night and cannot recall anything from that point on.  Her sister is present and states pt. Called her around 10 last night--pt. Does not recall that conversation but, pt. Was c/o "feeling strange" at that time to her sister.  Speech is clear and concise--follows commands and has a steady gait.

## 2011-03-17 ENCOUNTER — Encounter (INDEPENDENT_AMBULATORY_CARE_PROVIDER_SITE_OTHER): Payer: Self-pay | Admitting: Internal Medicine

## 2011-03-17 ENCOUNTER — Ambulatory Visit (INDEPENDENT_AMBULATORY_CARE_PROVIDER_SITE_OTHER): Payer: Medicare Other | Admitting: Internal Medicine

## 2011-03-17 ENCOUNTER — Other Ambulatory Visit (INDEPENDENT_AMBULATORY_CARE_PROVIDER_SITE_OTHER): Payer: Self-pay | Admitting: Internal Medicine

## 2011-03-17 VITALS — BP 120/80 | HR 72 | Temp 98.0°F | Resp 14 | Ht 61.0 in | Wt 143.0 lb

## 2011-03-17 DIAGNOSIS — R1310 Dysphagia, unspecified: Secondary | ICD-10-CM

## 2011-03-17 DIAGNOSIS — R1319 Other dysphagia: Secondary | ICD-10-CM

## 2011-03-17 DIAGNOSIS — R1314 Dysphagia, pharyngoesophageal phase: Secondary | ICD-10-CM

## 2011-03-17 DIAGNOSIS — K3184 Gastroparesis: Secondary | ICD-10-CM

## 2011-03-17 MED ORDER — DILTIAZEM HCL 30 MG PO TABS: 30.0000 mg | ORAL_TABLET | Freq: Three times a day (TID) | ORAL | Status: DC

## 2011-03-17 NOTE — Progress Notes (Signed)
Present complaint; dysphagia; history of gastroparesis. Subjective; patient is 70 year old Caucasian female who was diabetic gastroparesis is maintained on Reglan and erythromycin who is here for scheduled visit. She believes she is doing well with this combination; she is not having any side effects with erythromycin. she has intermittent bloating but nothing like before. Her main symptoms today is one of dysphagia. He has difficulty with foods pills and fluids. She points to upper sternal area site of bolus obstruction. She she she may have difficulty at the level of suprasternal notch and at lower sternal area she denies heartburn hoarseness or chronic cough. Her weight has remained stable since her last visit in June. Her bowels are moving daily or every other day and she denies melena or rectal bleeding Current medications Current Outpatient Prescriptions  Medication Sig Dispense Refill  . atorvastatin (LIPITOR) 10 MG tablet Take 10 mg by mouth daily.        . baclofen (LIORESAL) 10 MG tablet Take 10 mg by mouth. Patient takes 1 tablet ,1/2 at lunch and 1/2 at bedtime      . benzonatate (TESSALON) 100 MG capsule Take 1 capsule (100 mg total) by mouth every 8 (eight) hours.  21 capsule  0  . calcium-vitamin D (OSCAL WITH D) 250-125 MG-UNIT per tablet Take 1 tablet by mouth daily.        . clopidogrel (PLAVIX) 75 MG tablet Take 75 mg by mouth daily.        . cyclobenzaprine (FLEXERIL) 10 MG tablet Take 10 mg by mouth 3 (three) times daily as needed. For muscle pain      . dexlansoprazole (DEXILANT) 60 MG capsule Take 60 mg by mouth daily.        . DULoxetine (CYMBALTA) 60 MG capsule Take 60 mg by mouth daily.        . enalapril (VASOTEC) 5 MG tablet Take 5 mg by mouth daily.        Marland Kitchen erythromycin (E-MYCIN) 250 MG tablet Take 100 mg by mouth 3 (three) times daily.       . fentaNYL (DURAGESIC - DOSED MCG/HR) 50 MCG/HR Place 1 patch onto the skin every 3 (three) days.        . ferrous sulfate 325 (65  FE) MG tablet Take 325 mg by mouth daily after supper.        . latanoprost (XALATAN) 0.005 % ophthalmic solution Place 1 drop into both eyes at bedtime.       . metFORMIN (GLUCOPHAGE) 1000 MG tablet Take 1,000 mg by mouth 2 (two) times daily.        . metoCLOPramide (REGLAN) 10 MG tablet Take 10-15 mg by mouth 3 (three) times daily. Takes 10 mg in the morning and at bedtime and 15 mg in the afternoon      . temazepam (RESTORIL) 22.5 MG capsule Take 22.5 mg by mouth at bedtime as needed. For sleep      . diltiazem (CARDIZEM) 30 MG tablet Take 1 tablet (30 mg total) by mouth 3 (three) times daily before meals.  90 tablet  11  . E.E.S. GRANULES 200 MG/5ML suspension TAKE 1/2 TEASPOONFUL (100MG ) THREE TIMES DAILY  300 mL  4  . lansoprazole (PREVACID) 30 MG capsule Take 30 mg by mouth daily.         objective BP 120/80  Pulse 72  Temp(Src) 98 F (36.7 C) (Oral)  Resp 14  Ht 5\' 1"  (1.549 m)  Wt 143 lb (64.864 kg)  BMI 27.02 kg/m2 Conjunctiva is pink sclerae nonicteric. Oropharyngeal mucosa is normal in she does not have any involuntary movements of her lips or tongue Neck masses noted Abdomen is full with normal bowel sounds percussion note tympanitic in epigastric region but no organomegaly or masses. No LE edema noted Assessment; #1. Diabetic gastroparesis; she has tolerated Reglan dose reduction with the addition of erythromycin. Will consider dropping Reglan dose further after the holidays. #2. Dysphagia. She had abnormal barium pill study in April getting stuck at the GE junction. She had esophageal dilation in May,2012 without significant benefit. She possibly have motility disorder or she could have esophageal spasm. Plan Continue Reglan and erythromycin at current dose Artery is down 30 mg 30 minutes before each meal discussion given for 90 doses without refill. She will call us with a progress report in 2 weeks. If she does not improve with calcium channel blocker was referred to [facility]  for esophageal manometry and impedance study

## 2011-03-17 NOTE — Patient Instructions (Signed)
Cardizem 30 mg by mouth 30 minutes before each meal. Call office with progress report in 2 weeks.

## 2011-03-25 ENCOUNTER — Other Ambulatory Visit (HOSPITAL_COMMUNITY): Payer: Self-pay | Admitting: Neurosurgery

## 2011-03-25 DIAGNOSIS — M542 Cervicalgia: Secondary | ICD-10-CM

## 2011-03-26 ENCOUNTER — Ambulatory Visit (HOSPITAL_COMMUNITY)
Admission: RE | Admit: 2011-03-26 | Discharge: 2011-03-26 | Disposition: A | Discharge status: Home / Self Care | Payer: Medicare Other | Source: Ambulatory Visit | Attending: Neurosurgery | Admitting: Neurosurgery

## 2011-03-26 DIAGNOSIS — Z981 Arthrodesis status: Secondary | ICD-10-CM | POA: Insufficient documentation

## 2011-03-26 DIAGNOSIS — M542 Cervicalgia: Secondary | ICD-10-CM | POA: Insufficient documentation

## 2011-04-02 ENCOUNTER — Telehealth (INDEPENDENT_AMBULATORY_CARE_PROVIDER_SITE_OTHER): Payer: Self-pay | Admitting: *Deleted

## 2011-04-02 NOTE — Telephone Encounter (Signed)
Patient called with a progress report. She states that she had to stop the Cardizem 3-4 days ago. It made her feel really strange and very sleepy.I told her that I would make Dr. Karilyn Cota aware and we would in contact with her.

## 2011-04-12 NOTE — Telephone Encounter (Signed)
Patient had side effects with low-dose diltiazem and it did not help with her dysphagia. Patient states her dysphagia is getting worse. Will schedule her for esophageal manometry and impedance study at [facility].

## 2011-04-14 NOTE — Telephone Encounter (Signed)
Referral faxed to [facility] for esophageal manometry, [facility] will contact patient with appt

## 2011-05-08 ENCOUNTER — Other Ambulatory Visit: Payer: Self-pay | Admitting: Neurosurgery

## 2011-05-08 ENCOUNTER — Ambulatory Visit
Admission: RE | Admit: 2011-05-08 | Discharge: 2011-05-08 | Disposition: A | Discharge status: Home / Self Care | Source: Ambulatory Visit | Attending: Neurosurgery | Admitting: Neurosurgery

## 2011-05-08 DIAGNOSIS — M5020 Other cervical disc displacement, unspecified cervical region: Secondary | ICD-10-CM

## 2011-05-08 DIAGNOSIS — M5030 Other cervical disc degeneration, unspecified cervical region: Secondary | ICD-10-CM

## 2011-05-08 DIAGNOSIS — M549 Dorsalgia, unspecified: Secondary | ICD-10-CM

## 2011-05-08 DIAGNOSIS — M545 Low back pain, unspecified: Secondary | ICD-10-CM

## 2011-07-04 ENCOUNTER — Encounter (INDEPENDENT_AMBULATORY_CARE_PROVIDER_SITE_OTHER): Payer: Self-pay | Admitting: *Deleted

## 2011-07-07 ENCOUNTER — Other Ambulatory Visit: Payer: Self-pay | Admitting: Dermatology

## 2011-07-10 ENCOUNTER — Telehealth (INDEPENDENT_AMBULATORY_CARE_PROVIDER_SITE_OTHER): Payer: Self-pay | Admitting: *Deleted

## 2011-07-10 ENCOUNTER — Encounter (INDEPENDENT_AMBULATORY_CARE_PROVIDER_SITE_OTHER): Payer: Self-pay | Admitting: *Deleted

## 2011-07-10 NOTE — Telephone Encounter (Signed)
[name] called the office and stated that it had been 2 weeks since she had gone to [facility] and had test done. She was wanting to know test results. I explained that I did not see the results and that sometimes [name] would forward the results to the patient's PCP, however I would check with [name] our referral coordinator to check on the results and we would contact her.

## 2011-07-11 NOTE — Telephone Encounter (Signed)
Spoke to [facility], she has faxed Ms Platas's test and Dr Karilyn Cota will call her once he reviews, Ms Freimuth is aware

## 2011-07-14 ENCOUNTER — Other Ambulatory Visit (INDEPENDENT_AMBULATORY_CARE_PROVIDER_SITE_OTHER): Payer: Self-pay | Admitting: *Deleted

## 2011-07-17 ENCOUNTER — Ambulatory Visit (INDEPENDENT_AMBULATORY_CARE_PROVIDER_SITE_OTHER): Payer: Medicare Other | Admitting: Internal Medicine

## 2011-07-28 ENCOUNTER — Ambulatory Visit (HOSPITAL_COMMUNITY)
Admission: RE | Admit: 2011-07-28 | Discharge: 2011-07-28 | Disposition: A | Discharge status: Home / Self Care | Payer: Medicare Other | Source: Ambulatory Visit | Attending: Internal Medicine | Admitting: Internal Medicine

## 2011-07-28 DIAGNOSIS — R1310 Dysphagia, unspecified: Secondary | ICD-10-CM | POA: Insufficient documentation

## 2011-08-04 ENCOUNTER — Ambulatory Visit (INDEPENDENT_AMBULATORY_CARE_PROVIDER_SITE_OTHER): Payer: Medicare Other | Admitting: Internal Medicine

## 2011-08-04 ENCOUNTER — Encounter (INDEPENDENT_AMBULATORY_CARE_PROVIDER_SITE_OTHER): Payer: Self-pay | Admitting: Internal Medicine

## 2011-08-04 DIAGNOSIS — E1142 Type 2 diabetes mellitus with diabetic polyneuropathy: Secondary | ICD-10-CM

## 2011-08-04 DIAGNOSIS — E1143 Type 2 diabetes mellitus with diabetic autonomic (poly)neuropathy: Secondary | ICD-10-CM | POA: Insufficient documentation

## 2011-08-04 DIAGNOSIS — K3184 Gastroparesis: Secondary | ICD-10-CM

## 2011-08-04 DIAGNOSIS — K220 Achalasia of cardia: Secondary | ICD-10-CM | POA: Insufficient documentation

## 2011-08-04 DIAGNOSIS — E1149 Type 2 diabetes mellitus with other diabetic neurological complication: Secondary | ICD-10-CM

## 2011-08-04 DIAGNOSIS — R110 Nausea: Secondary | ICD-10-CM

## 2011-08-04 DIAGNOSIS — Z860100 Personal history of colon polyps, unspecified: Secondary | ICD-10-CM

## 2011-08-04 MED ORDER — ONDANSETRON HCL 4 MG PO TABS: 4.0000 mg | ORAL_TABLET | Freq: Two times a day (BID) | ORAL | Status: DC | PRN

## 2011-08-04 NOTE — Patient Instructions (Addendum)
New medication is Odansetron for  Nausea; you can take it up to twice daily. Colonoscopy to be scheduled later this year.

## 2011-08-04 NOTE — Progress Notes (Signed)
Presenting complaint; Nausea. Subjective: Patient is 71 year old Caucasian female who was recently diagnosed with achalasia and referred to Dr. Francee Gentile of [facility]. She is scheduled for surgery on 08/12/2011. She was advised to stop Zantac and Reglan. She continues to complain of dysphagia. She is able to swallow milk shake without any difficulty. She complains of intense nausea as well as bloating. She denies melena or rectal bleeding. In spite of her symptoms she has not lost any weight. Nausea is not associated with vomiting. Current Medications: Current Outpatient Prescriptions  Medication Sig Dispense Refill  . ALPRAZolam (XANAX) 1 MG tablet Take 1 mg by mouth at bedtime as needed. Patient takes 1/2 tablet every night      . baclofen (LIORESAL) 10 MG tablet Take 10 mg by mouth. Patient tatkes ,1/2 at lunch and 1/2 at bedtime      . calcium-vitamin D (OSCAL WITH D) 250-125 MG-UNIT per tablet Take 1 tablet by mouth daily.        . clopidogrel (PLAVIX) 75 MG tablet Take 75 mg by mouth daily.        . cyclobenzaprine (FLEXERIL) 10 MG tablet Take 10 mg by mouth Nightly. For muscle pain      . dexlansoprazole (DEXILANT) 60 MG capsule Take 60 mg by mouth daily.        . DULoxetine (CYMBALTA) 60 MG capsule Take 60 mg by mouth daily.        . E.E.S. GRANULES 200 MG/5ML suspension TAKE 1/2 TEASPOONFUL (100MG ) THREE TIMES DAILY  300 mL  4  . enalapril (VASOTEC) 5 MG tablet Take 5 mg by mouth daily.        . fentaNYL (DURAGESIC - DOSED MCG/HR) 50 MCG/HR Place 1 patch onto the skin every 3 (three) days.        . ferrous sulfate 325 (65 FE) MG tablet Take 325 mg by mouth daily after supper.        . latanoprost (XALATAN) 0.005 % ophthalmic solution Place 1 drop into both eyes at bedtime.       Marland Kitchen diltiazem (CARDIZEM) 30 MG tablet Take 1 tablet (30 mg total) by mouth 3 (three) times daily before meals.  90 tablet  11  . metFORMIN (GLUCOPHAGE) 1000 MG tablet Take 1,000 mg by mouth 2 (two) times daily.         . metoCLOPramide (REGLAN) 10 MG tablet Take 10-15 mg by mouth 3 (three) times daily. Takes 10 mg in the morning and at bedtime and 15 mg in the afternoon        Objective: Blood pressure 124/70, pulse 76, temperature 99.3 F (37.4 C), temperature source Oral, resp. rate 12, height 5\' 1"  (1.549 m), weight 141 lb 4.8 oz (64.093 kg). Conjunctiva is pink. Sclera is nonicteric Oropharyngeal mucosa is normal.  No neck masses or thyromegaly noted. Abdomen is symmetrical. Bowel sounds are normal. No succussion splash noted over epigastric region. She has mild mid epigastric tenderness. No organomegaly or masses. No LE edema or clubbing noted.  Assessment: #1. Nausea most likely secondary to gastroparesis and or poor esophageal emptying due to achalasia. Nausea is worse off Reglan. #2. History of colonic polyps including one with high-grade dysplasia. Status post lap-assisted right hemicolectomy in March 2010. Will need surveillance colonoscopy when she is recovered from surgery for achalasia.   Plan: Ondansetron 4 mg by mouth twice a day when necessary. Office visit in 4 months.

## 2011-08-06 DIAGNOSIS — D509 Iron deficiency anemia, unspecified: Secondary | ICD-10-CM | POA: Insufficient documentation

## 2011-08-06 DIAGNOSIS — E119 Type 2 diabetes mellitus without complications: Secondary | ICD-10-CM | POA: Insufficient documentation

## 2011-08-23 ENCOUNTER — Other Ambulatory Visit (INDEPENDENT_AMBULATORY_CARE_PROVIDER_SITE_OTHER): Payer: Self-pay | Admitting: Internal Medicine

## 2011-08-23 DIAGNOSIS — K3184 Gastroparesis: Secondary | ICD-10-CM

## 2011-10-07 ENCOUNTER — Ambulatory Visit (INDEPENDENT_AMBULATORY_CARE_PROVIDER_SITE_OTHER): Payer: Medicare Other | Admitting: Internal Medicine

## 2011-10-07 ENCOUNTER — Encounter (INDEPENDENT_AMBULATORY_CARE_PROVIDER_SITE_OTHER): Payer: Self-pay | Admitting: *Deleted

## 2011-10-07 ENCOUNTER — Encounter (INDEPENDENT_AMBULATORY_CARE_PROVIDER_SITE_OTHER): Payer: Self-pay | Admitting: Internal Medicine

## 2011-10-07 VITALS — BP 104/52 | HR 60 | Temp 99.0°F | Ht 61.0 in | Wt 144.6 lb

## 2011-10-07 DIAGNOSIS — K219 Gastro-esophageal reflux disease without esophagitis: Secondary | ICD-10-CM

## 2011-10-07 DIAGNOSIS — K220 Achalasia of cardia: Secondary | ICD-10-CM

## 2011-10-07 NOTE — Patient Instructions (Signed)
Barium pill study. Further recommendations once we have study back.

## 2011-10-07 NOTE — Progress Notes (Addendum)
Subjective:     Patient ID: [name], female   DOB: 10/17/1940, 71 y.o.   MRN: [id number]  HPI [name] is a 71 yr old female presenting today with c/o that anything she eats will make her sick. Nausea but no vomiting. Symptoms x 2 weeks.  She is most eating solids. She tells me that solids food hurt when she swallows. No dysphagia. Appetite is not good. Weight loss of 10 pounds but she has gained this back.   She does have epigastric pain.  She is having a BM every day.     She recently had surgery at [facility] on her stomach and esophagus for achalasia by Dr. Carolynn Sayers  (September 04, 2011),   Review of Systems see hpi Current Outpatient Prescriptions  Medication Sig Dispense Refill  . ALPRAZolam (XANAX) 1 MG tablet Take 1 mg by mouth at bedtime as needed. Patient takes 1/2 tablet every night      . calcium-vitamin D (OSCAL WITH D) 250-125 MG-UNIT per tablet Take 1 tablet by mouth daily.        . clopidogrel (PLAVIX) 75 MG tablet Take 75 mg by mouth daily.        . cyclobenzaprine (FLEXERIL) 10 MG tablet Take 10 mg by mouth Nightly. For muscle pain      . dexlansoprazole (DEXILANT) 60 MG capsule Take 60 mg by mouth daily.        . DULoxetine (CYMBALTA) 60 MG capsule Take 60 mg by mouth daily.        . E.E.S. GRANULES 200 MG/5ML suspension TAKE 1/2 TEASPOONFUL BY MOUTH THREE TIMES DAILY  225 mL  3  . enalapril (VASOTEC) 5 MG tablet Take 5 mg by mouth daily.        . fentaNYL (DURAGESIC - DOSED MCG/HR) 50 MCG/HR Place 1 patch onto the skin every 3 (three) days.        . ferrous sulfate 325 (65 FE) MG tablet Take 325 mg by mouth daily after supper.        . latanoprost (XALATAN) 0.005 % ophthalmic solution Place 1 drop into both eyes at bedtime.       . metFORMIN (GLUCOPHAGE) 1000 MG tablet Take 1,000 mg by mouth daily.       . ondansetron (ZOFRAN) 4 MG tablet Take 1 tablet (4 mg total) by mouth every 12 (twelve) hours as needed for nausea.  30 tablet  1  . baclofen (LIORESAL) 10 MG tablet Take  10 mg by mouth. Patient tatkes ,1/2 at lunch and 1/2 at bedtime       Past Medical History  Diagnosis Date  . Nausea   . Bloating   . Dysphagia   . Gastroesophageal reflux   . Gastroparesis   . Anemia   . Diabetes mellitus   . TIA (transient ischemic attack)    History   Social History  . Marital Status: Widowed    Spouse Name: N/A    Number of Children: N/A  . Years of Education: N/A   Occupational History  . Not on file.   Social History Main Topics  . Smoking status: Former Smoker    Types: Cigarettes    Quit date: 03/17/1991  . Smokeless tobacco: Never Used   Comment: Patient states that it has ben greater than 20 years since she quit smoking  . Alcohol Use: No  . Drug Use: No  . Sexually Active: Not on file   Other Topics Concern  . Not on  file   Social History Narrative  . No narrative on file   Allergies  Allergen Reactions  . Codeine Nausea And Vomiting  . Morphine Other (See Comments)    "it will kill me"  . Shellfish Allergy Other (See Comments)    Blood sugar drops  . Penicillins Rash   Ms. Swarm does not currently have medications on file. Family Status  Relation Status Death Age  . Mother Deceased 81  . Father Deceased 36  . Sister Alive   . Brother Alive   . Brother Alive   . Daughter Alive   . Daughter Alive     ortho problems r/t hip and shoulder  . Daughter Alive   . Son Alive        Objective:   Physical Exam Filed Vitals:   10/07/11 1529  Height: 5\' 1"  (1.549 m)  Weight: 144 lb 9.6 oz (65.59 kg)  Alert and oriented. Skin warm and dry. Oral mucosa is moist.   . Sclera anicteric, conjunctivae is pink. Thyroid not enlarged. No cervical lymphadenopathy. Lungs clear. Heart regular rate and rhythm.  Abdomen is soft. Bowel sounds are positive. No hepatomegaly. No abdominal masses felt.Epigastric tenderness.  No edema to lower extremities. Patient is alert and oriented.     Assessment:    Uncontrolled GERD. Achalasia  I  discussed this case with Dr. Karilyn Cota.     Plan:    Barium pill study. Further recommendations once we have study back.

## 2011-10-09 ENCOUNTER — Ambulatory Visit (HOSPITAL_COMMUNITY)
Admission: RE | Admit: 2011-10-09 | Discharge: 2011-10-09 | Disposition: A | Discharge status: Home / Self Care | Payer: Medicare Other | Source: Ambulatory Visit | Attending: Internal Medicine | Admitting: Internal Medicine

## 2011-10-09 DIAGNOSIS — K220 Achalasia of cardia: Secondary | ICD-10-CM

## 2011-10-09 DIAGNOSIS — K224 Dyskinesia of esophagus: Secondary | ICD-10-CM | POA: Insufficient documentation

## 2011-10-09 DIAGNOSIS — K219 Gastro-esophageal reflux disease without esophagitis: Secondary | ICD-10-CM

## 2011-10-10 ENCOUNTER — Encounter (HOSPITAL_COMMUNITY): Payer: Self-pay | Admitting: *Deleted

## 2011-10-10 ENCOUNTER — Emergency Department (HOSPITAL_COMMUNITY)
Admission: EM | Admit: 2011-10-10 | Discharge: 2011-10-11 | Disposition: A | Discharge status: Home / Self Care | Payer: Medicare Other | Attending: Emergency Medicine | Admitting: Emergency Medicine

## 2011-10-10 DIAGNOSIS — K219 Gastro-esophageal reflux disease without esophagitis: Secondary | ICD-10-CM | POA: Insufficient documentation

## 2011-10-10 DIAGNOSIS — L509 Urticaria, unspecified: Secondary | ICD-10-CM | POA: Insufficient documentation

## 2011-10-10 DIAGNOSIS — E119 Type 2 diabetes mellitus without complications: Secondary | ICD-10-CM | POA: Insufficient documentation

## 2011-10-10 DIAGNOSIS — Z79899 Other long term (current) drug therapy: Secondary | ICD-10-CM | POA: Insufficient documentation

## 2011-10-10 DIAGNOSIS — Z8673 Personal history of transient ischemic attack (TIA), and cerebral infarction without residual deficits: Secondary | ICD-10-CM | POA: Insufficient documentation

## 2011-10-10 MED ORDER — FAMOTIDINE 20 MG PO TABS: 20.0000 mg | ORAL_TABLET | Freq: Two times a day (BID) | ORAL | Status: DC

## 2011-10-10 MED ORDER — DIPHENHYDRAMINE HCL 25 MG PO TABS: 25.0000 mg | ORAL_TABLET | Freq: Four times a day (QID) | ORAL | Status: DC

## 2011-10-10 MED ORDER — DIPHENHYDRAMINE HCL 50 MG/ML IJ SOLN
25.0000 mg | Freq: Once | INTRAMUSCULAR | Status: AC
  Administered 2011-10-10: 25 mg via INTRAVENOUS
  Filled 2011-10-10: qty 1

## 2011-10-10 MED ORDER — PREDNISONE 20 MG PO TABS
60.0000 mg | ORAL_TABLET | Freq: Once | ORAL | Status: AC
  Administered 2011-10-10: 60 mg via ORAL
  Filled 2011-10-10: qty 3

## 2011-10-10 MED ORDER — PROMETHAZINE HCL 25 MG/ML IJ SOLN
25.0000 mg | Freq: Once | INTRAMUSCULAR | Status: AC
  Administered 2011-10-10: 25 mg via INTRAVENOUS
  Filled 2011-10-10: qty 1

## 2011-10-10 MED ORDER — DIPHENHYDRAMINE HCL 25 MG PO CAPS
50.0000 mg | ORAL_CAPSULE | Freq: Once | ORAL | Status: AC
  Administered 2011-10-10: 50 mg via ORAL
  Filled 2011-10-10: qty 2

## 2011-10-10 MED ORDER — FAMOTIDINE 20 MG PO TABS
40.0000 mg | ORAL_TABLET | Freq: Once | ORAL | Status: AC
  Administered 2011-10-10: 40 mg via ORAL
  Filled 2011-10-10: qty 2

## 2011-10-10 MED ORDER — PREDNISONE 50 MG PO TABS: ORAL_TABLET | ORAL | Status: AC

## 2011-10-10 NOTE — Discharge Instructions (Signed)

## 2011-10-10 NOTE — ED Notes (Signed)
Hives ,feels anxious

## 2011-10-10 NOTE — ED Provider Notes (Signed)
This chart was scribed for Glynn Octave, MD by Wallis Mart. The patient was seen in room APA14/APA14 and the patient's care was started at 9:48 PM.   CSN: [id number]  Arrival date & time 10/10/11  2105   First MD Initiated Contact with Patient 10/10/11 2126      Chief Complaint  Patient presents with  . Rash    (Consider location/radiation/quality/duration/timing/severity/associated sxs/prior treatment) HPI [name] is a 71 y.o. female who presents to the Emergency Department complaining of sudden onset, persistence of constant, gradually worsening rash on her legs, hands, and ears onset last night.  Denies SOB, cp, abd pain. Denies using any new soaps or detergents.  Pt took Zofran in March w/ no side effects, and started taking Zofran again 3 days ago.  Pt also had an xray three days ago.  Pt took Benadryl w/ no relief. Pt w/ DM.  There are no other associated symptoms and no other alleviating or aggravating factors.  Past Medical History  Diagnosis Date  . Nausea   . Bloating   . Dysphagia   . Gastroesophageal reflux   . Gastroparesis   . Anemia   . Diabetes mellitus   . TIA (transient ischemic attack)     Past Surgical History  Procedure Date  . Hemicolectomy ZIEGLER  . Colonoscopy 10/11/2010  . Colonoscopy 11/23/2009  . Colonoscopy 07/28/2008    W/SNARE  . Colonoscopy 06/28/07  . Colonoscopy 05/10/07    W/POLYP  . Colonoscopy 12/28/00  . Upper gastrointestinal endoscopy 10/11/2010    EGD ED  . Upper gastrointestinal endoscopy 11/23/2009  . Upper gastrointestinal endoscopy 05/10/07    EGD ED  . Upper gastrointestinal endoscopy 08/11/01    EGD ED  . Cervical spine surgery   . Abdominal surgery     Family History  Problem Relation Age of Onset  . Heart disease Mother   . Diabetes Mother   . Dementia Father   . Healthy Sister   . Diabetes Brother   . Kidney cancer Brother   . Diabetes Brother   . Neuropathy Brother   . Diabetes Daughter   .  Diabetes Daughter   . Diabetes Son     History  Substance Use Topics  . Smoking status: Former Smoker    Types: Cigarettes    Quit date: 03/17/1991  . Smokeless tobacco: Never Used   Comment: Patient states that it has ben greater than 20 years since she quit smoking  . Alcohol Use: No    OB History    Grav Para Term Preterm Abortions TAB SAB Ect Mult Living                  Review of Systems  10 Systems reviewed and all are negative for acute change except as noted in the HPI.    Allergies  Codeine; Morphine; Shellfish allergy; and Penicillins  Home Medications   Current Outpatient Rx  Name Route Sig Dispense Refill  . ALPRAZOLAM 1 MG PO TABS Oral Take 1 mg by mouth at bedtime as needed. Patient takes 1/2 tablet every night    . BACLOFEN 10 MG PO TABS Oral Take 10 mg by mouth. Patient tatkes ,1/2 at lunch and 1/2 at bedtime    . CALCIUM CARBONATE-VITAMIN D 250-125 MG-UNIT PO TABS Oral Take 1 tablet by mouth daily.      Marland Kitchen CLOPIDOGREL BISULFATE 75 MG PO TABS Oral Take 75 mg by mouth daily.      . CYCLOBENZAPRINE  HCL 10 MG PO TABS Oral Take 10 mg by mouth Nightly. For muscle pain    . DEXLANSOPRAZOLE 60 MG PO CPDR Oral Take 60 mg by mouth daily.      . DULOXETINE HCL 60 MG PO CPEP Oral Take 60 mg by mouth daily.      . E.E.S. GRANULES 200 MG/5ML PO SUSR  TAKE 1/2 TEASPOONFUL BY MOUTH THREE TIMES DAILY 225 mL 3  . ENALAPRIL MALEATE 5 MG PO TABS Oral Take 5 mg by mouth daily.      . FENTANYL 50 MCG/HR TD PT72 Transdermal Place 1 patch onto the skin every 3 (three) days.      Di Kindle SULFATE 325 (65 FE) MG PO TABS Oral Take 325 mg by mouth daily after supper.      Marland Kitchen LATANOPROST 0.005 % OP SOLN Both Eyes Place 1 drop into both eyes at bedtime.     Marland Kitchen METFORMIN HCL 1000 MG PO TABS Oral Take 1,000 mg by mouth daily.     Marland Kitchen ONDANSETRON HCL 4 MG PO TABS Oral Take 1 tablet (4 mg total) by mouth every 12 (twelve) hours as needed for nausea. 30 tablet 1    BP 167/75  Pulse 65   Temp(Src) 98.1 F (36.7 C) (Oral)  Resp 20  Ht 5\' 1"  (1.549 m)  Wt 144 lb (65.318 kg)  BMI 27.21 kg/m2  SpO2 100%  Physical Exam  Nursing note and vitals reviewed. Constitutional: She is oriented to person, place, and time. She appears well-developed and well-nourished. No distress.  HENT:  Head: Normocephalic and atraumatic.  Mouth/Throat: Oropharynx is clear and moist.       Uvula midline  Eyes: EOM are normal. Pupils are equal, round, and reactive to light.  Neck: Neck supple. No tracheal deviation present.  Cardiovascular: Normal rate and regular rhythm.   Pulmonary/Chest: Effort normal and breath sounds normal. No respiratory distress. She has no wheezes.  Abdominal: Soft. She exhibits no distension.  Musculoskeletal: Normal range of motion. She exhibits no edema.  Neurological: She is alert and oriented to person, place, and time. No sensory deficit.  Skin: Skin is warm and dry.       urticaria wheels to bilateral extremities , dorsal hands and elbows, no rash to chest, back, abdomen  Psychiatric: She has a normal mood and affect. Her behavior is normal.    ED Course  Procedures (including critical care time) DIAGNOSTIC STUDIES: Oxygen Saturation is 100% on room air, normal by my interpretation.    COORDINATION OF CARE:    Labs Reviewed - No data to display Dg Esophagus  10/09/2011  *RADIOLOGY REPORT*  Clinical Data: Achalasia, reflux, recent esophageal surgery  ESOPHOGRAM/BARIUM SWALLOW  Technique:  Single contrast examination was performed using thin barium.  Fluoroscopy time:  1.1 minutes.  Comparison:  07/28/2011  Findings:  Normal oral phase of swallowing.  No laryngeal penetration or tracheal aspiration.  Esophageal dysmotility with complete disruption of normal peristalsis involving the mid/lower esophagus.  No fixed esophageal narrowing or stricture.  Contrast passes freely into the stomach.  A barium tablet was not administered given esophageal dysmotility.  The  patient could not be assessed for gastroesophageal reflux given her inability to clear her esophagus.  IMPRESSION: Esophageal dysmotility involving the mid/lower esophagus.  No fixed esophageal narrowing or stricture.  Contrast passes freely into the stomach.  The patient could not be assessed for gastroesophageal reflux due to inability to clear her esophagus.  Original Report Authenticated By:  Charline Bills, M.D.     No diagnosis found.    MDM  Urticaria to lower extremities bilaterally with scattered areas and elbows and hands. No rash to chest, back abdomen. No airway involvement. Patient states she took Zofran 2 days ago and is her only new medication. She did take Zofran or marked without a problem. She denies any new soaps, detergents, foods.  She did have an esophagram yesterday with barium.  No evidence of systemic allergic reaction. Symptomatic control with antihistamines and steroids.  Doubt zofran as etiology.  Advised to return with difficulty breathing, chest pain, throat swelling or any other concerning symptoms.    I personally performed the services described in this documentation, which was scribed in my presence.  The recorded information has been reviewed and considered.           Glynn Octave, MD 10/10/11 [phone]

## 2011-10-22 ENCOUNTER — Telehealth (INDEPENDENT_AMBULATORY_CARE_PROVIDER_SITE_OTHER): Payer: Self-pay | Admitting: *Deleted

## 2011-10-22 ENCOUNTER — Other Ambulatory Visit (INDEPENDENT_AMBULATORY_CARE_PROVIDER_SITE_OTHER): Payer: Self-pay | Admitting: *Deleted

## 2011-10-22 DIAGNOSIS — K220 Achalasia of cardia: Secondary | ICD-10-CM

## 2011-10-22 NOTE — Telephone Encounter (Signed)
Kelly from Dr. Michelle Nasuti Office call and said it is okay to stop Kila's Plavix 5 days before her procedure. Called Ivonna Kinnick and advised her to stop her Plavix on Sat. 10/25/11, 5 days before her procedure.

## 2011-10-28 ENCOUNTER — Encounter (HOSPITAL_COMMUNITY): Payer: Self-pay | Admitting: Pharmacy Technician

## 2011-10-30 ENCOUNTER — Ambulatory Visit (HOSPITAL_COMMUNITY)
Admission: RE | Admit: 2011-10-30 | Discharge: 2011-10-30 | Disposition: A | Discharge status: Home / Self Care | Payer: Medicare Other | Source: Ambulatory Visit | Attending: Internal Medicine | Admitting: Internal Medicine

## 2011-10-30 ENCOUNTER — Encounter (HOSPITAL_COMMUNITY)
Admission: RE | Disposition: A | Discharge status: Home / Self Care | Payer: Self-pay | Source: Ambulatory Visit | Attending: Internal Medicine

## 2011-10-30 ENCOUNTER — Encounter (HOSPITAL_COMMUNITY): Payer: Self-pay | Admitting: *Deleted

## 2011-10-30 DIAGNOSIS — K2289 Other specified disease of esophagus: Secondary | ICD-10-CM

## 2011-10-30 DIAGNOSIS — K228 Other specified diseases of esophagus: Secondary | ICD-10-CM

## 2011-10-30 DIAGNOSIS — K449 Diaphragmatic hernia without obstruction or gangrene: Secondary | ICD-10-CM

## 2011-10-30 DIAGNOSIS — E1149 Type 2 diabetes mellitus with other diabetic neurological complication: Secondary | ICD-10-CM | POA: Insufficient documentation

## 2011-10-30 DIAGNOSIS — R1310 Dysphagia, unspecified: Secondary | ICD-10-CM

## 2011-10-30 DIAGNOSIS — R1013 Epigastric pain: Secondary | ICD-10-CM | POA: Insufficient documentation

## 2011-10-30 DIAGNOSIS — K220 Achalasia of cardia: Secondary | ICD-10-CM

## 2011-10-30 DIAGNOSIS — K3184 Gastroparesis: Secondary | ICD-10-CM | POA: Insufficient documentation

## 2011-10-30 DIAGNOSIS — R12 Heartburn: Secondary | ICD-10-CM

## 2011-10-30 HISTORY — DX: Other specified postprocedural states: Z98.890

## 2011-10-30 HISTORY — DX: Nausea with vomiting, unspecified: R11.2

## 2011-10-30 SURGERY — ESOPHAGOGASTRODUODENOSCOPY (EGD) WITH ESOPHAGEAL DILATION
Anesthesia: Moderate Sedation

## 2011-10-30 MED ORDER — MEPERIDINE HCL 25 MG/ML IJ SOLN
INTRAMUSCULAR | Status: DC | PRN
  Administered 2011-10-30 (×2): 25 mg via INTRAVENOUS

## 2011-10-30 MED ORDER — FAMOTIDINE 40 MG PO TABS: 40.0000 mg | ORAL_TABLET | Freq: Every day | ORAL | Status: DC

## 2011-10-30 MED ORDER — MIDAZOLAM HCL 5 MG/5ML IJ SOLN
INTRAMUSCULAR | Status: AC
  Filled 2011-10-30: qty 10

## 2011-10-30 MED ORDER — SODIUM CHLORIDE 0.45 % IV SOLN
Freq: Once | INTRAVENOUS | Status: AC
  Administered 2011-10-30: 20 mL/h via INTRAVENOUS

## 2011-10-30 MED ORDER — STERILE WATER FOR IRRIGATION IR SOLN
Status: DC | PRN
  Administered 2011-10-30: 12:00:00

## 2011-10-30 MED ORDER — MEPERIDINE HCL 50 MG/ML IJ SOLN
INTRAMUSCULAR | Status: AC
  Filled 2011-10-30: qty 1

## 2011-10-30 MED ORDER — MIDAZOLAM HCL 5 MG/5ML IJ SOLN
INTRAMUSCULAR | Status: DC | PRN
  Administered 2011-10-30 (×2): 2 mg via INTRAVENOUS

## 2011-10-30 MED ORDER — BUTAMBEN-TETRACAINE-BENZOCAINE 2-2-14 % EX AERO
INHALATION_SPRAY | CUTANEOUS | Status: DC | PRN
  Administered 2011-10-30: 2 via TOPICAL

## 2011-10-30 NOTE — Op Note (Signed)
EGD PROCEDURE REPORT  PATIENT:  [name]  MR#:  [id number] Birthdate:  12-23-1940, 71 y.o., female Endoscopist:  Dr. Malissa Hippo, MD Referred By:  Dr. Jeannett Senior the Sudie Bailey, MD Procedure Date: 10/30/2011  Procedure:   EGD  Indications:  Patient is 71 year old Caucasian female with history of diabetic gastroparesis  currently on erythromycin who is status post Heller's myotomy with partial wrap over 2 months ago for achalasia who  presents with epigastric pain and intractable heartburn. She is undergoing diagnostic EGD. She is having much less dysphagia since her surgery.            Informed Consent:  The risks, benefits, alternatives & imponderables which include, but are not limited to, bleeding, infection, perforation, drug reaction and potential missed lesion have been reviewed.  The potential for biopsy, lesion removal, esophageal dilation, etc. have also been discussed.  Questions have been answered.  All parties agreeable.  Please see history & physical in medical record for more information.  Medications:  Demerol 50 mg IV Versed 4 mg IV Cetacaine spray topically for oropharyngeal anesthesia  Description of procedure:  The endoscope was introduced through the mouth and advanced to the second portion of the duodenum without difficulty or limitations. The mucosal surfaces were surveyed very carefully during advancement of the scope and upon withdrawal.  Findings:  Esophagus:  Somewhat dilated baggy esophagus but mucosa was normal. GE junction wide-open. GEJ:  34 cm Hiatus:  37 cm Stomach:  Stomach was empty and distended very well with insufflation. Folds in the proximal stomach were normal. Examination of mucosa at body, antrum, pyloric channel, angularis, fundus and cardia was normal. Duodenum:  Normal bulbar and post bulbar mucosa.  Therapeutic/Diagnostic Maneuvers Performed:  None  Complications:  None  Impression: Dilated body of the esophagus but no evidence of  erosive esophagitis. Wide open lower esophageal sphincter. Small sliding hiatal hernia. No evidence of peptic ulcer disease or pyloric stenosis.   Recommendations:  Continue Dexilant as before. Famotidine or Pepcid 40 mg by mouth daily before evening meal. Office visit in one month.  [name]  10/30/2011  11:53 AM  CC: Dr. Milana Obey, MD, MD & Dr. Bonnetta Barry ref. provider found

## 2011-10-30 NOTE — Discharge Instructions (Signed)
Resume usual medications and diet. Pepcid or famotidine 40 mg by mouth 30-60 minutes before evening meal daily. No driving for 24 hours. Office visit in one month. Esophagogastroduodenoscopy This is an endoscopic procedure (a procedure that uses a device like a flexible telescope) that allows your caregiver to view the upper stomach and small bowel. This test allows your caregiver to look at the esophagus. The esophagus carries food from your mouth to your stomach. They can also look at your duodenum. This is the first part of the small intestine that attaches to the stomach. This test is used to detect problems in the bowel such as ulcers and inflammation. PREPARATION FOR TEST Nothing to eat after midnight the day before the test. NORMAL FINDINGS Normal esophagus, stomach, and duodenum. Ranges for normal findings may vary among different laboratories and hospitals. You should always check with your doctor after having lab work or other tests done to discuss the meaning of your test results and whether your values are considered within normal limits. MEANING OF TEST  Your caregiver will go over the test results with you and discuss the importance and meaning of your results, as well as treatment options and the need for additional tests if necessary. OBTAINING THE TEST RESULTS It is your responsibility to obtain your test results. Ask the lab or department performing the test when and how you will get your results. Document Released: 09/26/2004 Document Revised: 05/15/2011 Document Reviewed: 05/05/2008 [facility] Patient Information 2012 [place], [place].  Hiatal Hernia A hiatal hernia occurs when a part of the stomach slides above the diaphragm. The diaphragm is the thin muscle separating the belly (abdomen) from the chest. A hiatal hernia can be something you are born with or develop over time. Hiatal hernias may allow stomach acid to flow back into your esophagus, the tube which carries food from  your mouth to your stomach. If this acid causes problems it is called GERD (gastro-esophageal reflux disease).  SYMPTOMS  Common symptoms of GERD are heartburn (burning in your chest). This is worse when lying down or bending over. It may also cause belching and indigestion. Some of the things which make GERD worse are:  Increased weight pushes on stomach making acid rise more easily.   Smoking markedly increases acid production.   Alcohol decreases lower esophageal sphincter pressure (valve between stomach and esophagus), allowing acid from stomach into esophagus.   Late evening meals and going to bed with a full stomach increases pressure.   Anything that causes an increase in acid production.   Lower esophageal sphincter incompetence.  DIAGNOSIS  Hiatal hernia is often diagnosed with x-rays of your stomach and small bowel. This is called an UGI (upper gastrointestinal x-ray). Sometimes a gastroscopic procedure is done. This is a procedure where your caregiver uses a flexible instrument to look into the stomach and small bowel. HOME CARE INSTRUCTIONS   Try to achieve and maintain an ideal body weight.   Avoid drinking alcoholic beverages.   Stop smoking.   Put the head of your bed on 4 to 6 inch blocks. This will keep your head and esophagus higher than your stomach. If you cannot use blocks, sleep with several pillows under your head and shoulders.   Over-the-counter medications will decrease acid production. Your caregiver can also prescribe medications for this. Take as directed.   1/2 to 1 teaspoon of an antacid taken every hour while awake, with meals and at bedtime, will neutralize acid.   Do not take aspirin,  ibuprofen (Advil or Motrin), or other nonsteroidal anti-inflammatory drugs.   Do not wear tight clothing around your chest or stomach.   Eat smaller meals and eat more frequently. This keeps your stomach from getting too full. Eat slowly.   Do not lie down for 2  or 3 hours after eating. Do not eat or drink anything 1 to 2 hours before going to bed.   Avoid caffeine beverages (colas, coffee, cocoa, tea), fatty foods, citrus fruits and all other foods and drinks that contain acid and that seem to increase the problems.   Avoid bending over, especially after eating. Also avoid straining during bowel movements or when urinating or lifting things. Anything that increases the pressure in your belly increases the amount of acid that may be pushed up into your esophagus.  SEEK IMMEDIATE MEDICAL CARE IF:  There is change in location (pain in arms, neck, jaw, teeth or back) of your pain, or the pain is getting worse.   You also experience nausea, vomiting, sweating (diaphoresis), or shortness of breath.   You develop continual vomiting, vomit blood or coffee ground material, have bright red blood in your stools, or have black tarry stools.  Some of these symptoms could signal other problems such as heart disease. MAKE SURE YOU:   Understand these instructions.   Monitor your condition.   Contact your caregiver if you are not doing well or are getting worse.  Document Released: 08/16/2003 Document Revised: 05/15/2011 Document Reviewed: 05/26/2005 [facility] Patient Information 2012 [place], [place].

## 2011-10-30 NOTE — H&P (Signed)
[name] is an 71 y.o. female.   Chief Complaint: Patient is here for diagnostic esophagogastroduodenoscopy. HPI: Patient is 71 year old Caucasian female with complicated GI history who presents with intractable heartburn and epigastric pain. She has history of diabetic gastroparesis is and has been maintained on metoclopramide until recently. She is presently on erythromycin and not sure if it is helping. She says her dysphagia has improved but not normal. She denies hematemesis melena or rectal bleeding. Her appetite is normal but she is not able to eat because of her symptoms. Earlier this year she was diagnosed with a chalasia and underwent hent is myotomy and incomplete wrap at [facility] in March 2013.  Past Medical History  Diagnosis Date  . Nausea   . Bloating   . Dysphagia   . Gastroesophageal reflux   . Gastroparesis   . Anemia   . Diabetes mellitus   . TIA (transient ischemic attack)   . PONV (postoperative nausea and vomiting)     Past Surgical History  Procedure Date  . Hemicolectomy ZIEGLER  . Colonoscopy 10/11/2010  . Colonoscopy 11/23/2009  . Colonoscopy 07/28/2008    W/SNARE  . Colonoscopy 06/28/07  . Colonoscopy 05/10/07    W/POLYP  . Colonoscopy 12/28/00  . Upper gastrointestinal endoscopy 10/11/2010    EGD ED  . Upper gastrointestinal endoscopy 11/23/2009  . Upper gastrointestinal endoscopy 05/10/07    EGD ED  . Upper gastrointestinal endoscopy 08/11/01    EGD ED  . Cervical spine surgery   . Abdominal surgery     Family History  Problem Relation Age of Onset  . Heart disease Mother   . Diabetes Mother   . Dementia Father   . Healthy Sister   . Diabetes Brother   . Kidney cancer Brother   . Diabetes Brother   . Neuropathy Brother   . Diabetes Daughter   . Diabetes Daughter   . Diabetes Son    Social History:  reports that she quit smoking about 20 years ago. Her smoking use included Cigarettes. She has never used smokeless tobacco. She reports  that she does not drink alcohol or use illicit drugs.  Allergies:  Allergies  Allergen Reactions  . Codeine Nausea And Vomiting  . Morphine Other (See Comments)    "it will kill me"  . Ondansetron Hives  . Shellfish Allergy Other (See Comments)    Blood sugar drops  . Penicillins Rash    Medications Prior to Admission  Medication Sig Dispense Refill  . ALPRAZolam (XANAX) 1 MG tablet Take 0.5 mg by mouth at bedtime as needed. Anxiety      . calcium-vitamin D (OSCAL WITH D) 250-125 MG-UNIT per tablet Take 1 tablet by mouth daily.        . clopidogrel (PLAVIX) 75 MG tablet Take 75 mg by mouth daily.        . cyclobenzaprine (FLEXERIL) 10 MG tablet Take 10 mg by mouth at bedtime. For muscle pain      . dexlansoprazole (DEXILANT) 60 MG capsule Take 60 mg by mouth daily.        . diphenhydrAMINE (BENADRYL) 25 MG tablet Take 25 mg by mouth every 6 (six) hours as needed. Hives      . DULoxetine (CYMBALTA) 60 MG capsule Take 60 mg by mouth daily.        . enalapril (VASOTEC) 5 MG tablet Take 5 mg by mouth daily.        Marland Kitchen erythromycin ethylsuccinate (EES) 200 MG/5ML suspension  Take 100 mg by mouth 3 (three) times daily.      . fentaNYL (DURAGESIC - DOSED MCG/HR) 50 MCG/HR Place 1 patch onto the skin every 3 (three) days.        . ferrous sulfate 325 (65 FE) MG tablet Take 325 mg by mouth daily after supper.        . latanoprost (XALATAN) 0.005 % ophthalmic solution Place 1 drop into both eyes at bedtime.       . metFORMIN (GLUCOPHAGE) 1000 MG tablet Take 1,000 mg by mouth daily.         No results found for this or any previous visit (from the past 48 hour(s)). No results found.  ROS  Blood pressure 139/75, pulse 64, temperature 98.2 F (36.8 C), temperature source Oral, resp. rate 16, SpO2 95.00%. Physical Exam  Constitutional: She appears well-developed and well-nourished.  HENT:  Mouth/Throat: Oropharynx is clear and moist.  Eyes: Conjunctivae are normal. No scleral icterus.    Neck: No thyromegaly present.  Cardiovascular: Normal rate, regular rhythm and normal heart sounds.   No murmur heard. Respiratory: Effort normal and breath sounds normal.  GI: She exhibits no distension. There is tenderness (mid epigastric tenderness).  Musculoskeletal: She exhibits no edema.  Lymphadenopathy:    She has no cervical adenopathy.  Neurological: She is alert.  Skin: Skin is warm.     Assessment/Plan Epigastric pain intractable heartburn. Known diabetic gastroparesis. Recent head is myotomy and incomplete wrap for achalasia. Diagnostic EGD  Moise Friday U 10/30/2011, 11:30 AM

## 2011-12-02 ENCOUNTER — Encounter (INDEPENDENT_AMBULATORY_CARE_PROVIDER_SITE_OTHER): Payer: Self-pay | Admitting: Internal Medicine

## 2011-12-02 ENCOUNTER — Ambulatory Visit (INDEPENDENT_AMBULATORY_CARE_PROVIDER_SITE_OTHER): Payer: Medicare Other | Admitting: Internal Medicine

## 2011-12-02 VITALS — BP 132/74 | HR 76 | Temp 97.8°F | Resp 18 | Ht 61.0 in | Wt 138.8 lb

## 2011-12-02 DIAGNOSIS — E1142 Type 2 diabetes mellitus with diabetic polyneuropathy: Secondary | ICD-10-CM

## 2011-12-02 DIAGNOSIS — K3184 Gastroparesis: Secondary | ICD-10-CM

## 2011-12-02 DIAGNOSIS — Z862 Personal history of diseases of the blood and blood-forming organs and certain disorders involving the immune mechanism: Secondary | ICD-10-CM

## 2011-12-02 DIAGNOSIS — E1149 Type 2 diabetes mellitus with other diabetic neurological complication: Secondary | ICD-10-CM

## 2011-12-02 DIAGNOSIS — E1143 Type 2 diabetes mellitus with diabetic autonomic (poly)neuropathy: Secondary | ICD-10-CM

## 2011-12-02 DIAGNOSIS — D649 Anemia, unspecified: Secondary | ICD-10-CM

## 2011-12-02 LAB — CBC WITH DIFFERENTIAL/PLATELET
Basophils Absolute: 0 10*3/uL (ref 0.0–0.1)
Basophils Relative: 1 % (ref 0–1)
Eosinophils Absolute: 0.1 10*3/uL (ref 0.0–0.7)
Eosinophils Relative: 2 % (ref 0–5)
HCT: 35.9 % — ABNORMAL LOW (ref 36.0–46.0)
Hemoglobin: 11.6 g/dL — ABNORMAL LOW (ref 12.0–15.0)
Lymphocytes Relative: 43 % (ref 12–46)
Lymphs Abs: 2.5 10*3/uL (ref 0.7–4.0)
MCH: 27.7 pg (ref 26.0–34.0)
MCHC: 32.3 g/dL (ref 30.0–36.0)
MCV: 85.7 fL (ref 78.0–100.0)
Monocytes Absolute: 0.3 10*3/uL (ref 0.1–1.0)
Monocytes Relative: 6 % (ref 3–12)
Neutro Abs: 2.8 10*3/uL (ref 1.7–7.7)
Neutrophils Relative %: 48 % (ref 43–77)
Platelets: 189 10*3/uL (ref 150–400)
RBC: 4.19 MIL/uL (ref 3.87–5.11)
RDW: 14.8 % (ref 11.5–15.5)
WBC: 5.8 10*3/uL (ref 4.0–10.5)

## 2011-12-02 NOTE — Patient Instructions (Addendum)
Physician will call you with results of blood work.

## 2011-12-02 NOTE — Progress Notes (Signed)
Presenting complaint;  Follow for epigastric pain heartburn and nausea.   Subjective:  [name] is 71 year old Caucasian female with complicated GI history who underwent EGD on 10/30/2011 because of epigastric pain heartburn and dysphagia. This study revealed dilated body of the esophagus but without evidence of stricture. She has small sliding hiatal hernia. She was advised to continue PPI in a.m. and famotidine and p.m. She is feeling a lot better. She attributes her feeling better secondary to using digestive oil. She takes 2 drops in the morning and 2 in the evening. This was suggested to her by her daughter. She did not bring this medication with her. She has no heartburn or nausea. She still belches frequently but no regurgitation reported. Her bowels are moving regularly. Stools are dark since she is on iron. She denies rectal bleeding.  Current Medications: Current Outpatient Prescriptions  Medication Sig Dispense Refill  . ALPRAZolam (XANAX) 1 MG tablet Take 0.5 mg by mouth at bedtime as needed. Anxiety      . calcium-vitamin D (OSCAL WITH D) 250-125 MG-UNIT per tablet Take 1 tablet by mouth daily.        . clopidogrel (PLAVIX) 75 MG tablet Take 75 mg by mouth daily.        . cyclobenzaprine (FLEXERIL) 10 MG tablet Take 10 mg by mouth at bedtime. For muscle pain      . dexlansoprazole (DEXILANT) 60 MG capsule Take 60 mg by mouth daily.        . DULoxetine (CYMBALTA) 60 MG capsule Take 60 mg by mouth daily.        . enalapril (VASOTEC) 5 MG tablet Take 5 mg by mouth daily.        . famotidine (PEPCID) 40 MG tablet Take 1 tablet (40 mg total) by mouth daily.  30 tablet  5  . fentaNYL (DURAGESIC - DOSED MCG/HR) 50 MCG/HR Place 1 patch onto the skin every 3 (three) days.        . ferrous sulfate 325 (65 FE) MG tablet Take 325 mg by mouth daily after supper.        . latanoprost (XALATAN) 0.005 % ophthalmic solution Place 1 drop into both eyes at bedtime.       . metFORMIN (GLUCOPHAGE) 1000  MG tablet Take 1,000 mg by mouth daily.       . Misc Natural Products LIQD Take 2 drops by mouth. Digestive Drops patient takes 2 drops every morning and 2 drops every night      . diphenhydrAMINE (BENADRYL) 25 MG tablet Take 25 mg by mouth every 6 (six) hours as needed. Hives         Objective: Blood pressure 132/74, pulse 76, temperature 97.8 F (36.6 C), temperature source Oral, resp. rate 18, height 5\' 1"  (1.549 m), weight 138 lb 12.8 oz (62.959 kg). Patient is alert and in NAD Conjunctiva is pink. Sclera is nonicteric Oropharyngeal mucosa is normal. No neck masses or thyromegaly noted. Abdomen. Bowel sounds are normal. No succussion splash noted over epigastric region. Abdomen is soft with mild midepigastric tenderness. No organomegaly or masses the No LE edema or clubbing noted.   Assessment:  #1. Gastroparesis /GERD. She is doing very well with combination of Dexilant  to in a.m. and famotidine in p.m. and OTC medication(digestive oil). She's been off Reglan for several months. Erythromycin provided no relief. #2. Dysphagia. She has improved a great deal with surgery for achalasia. #3. History of anemia. #4. History of colonic polyps and one  with high-grade dysplasia/carcinoma. 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 Last colonoscopy in June 2011. Plan:  Continue current therapy. CBC be checked today Office visit in 6 months. Next colonoscopy would be in June 2014.

## 2011-12-22 ENCOUNTER — Telehealth (INDEPENDENT_AMBULATORY_CARE_PROVIDER_SITE_OTHER): Payer: Self-pay | Admitting: *Deleted

## 2011-12-22 NOTE — Telephone Encounter (Signed)
Would like for Dr. Karilyn Cota to call her at [phone]. She would like to speak with him about the digestive oil.

## 2011-12-23 NOTE — Telephone Encounter (Signed)
Patient's called returned. She states.digestive oil is still working

## 2012-01-01 ENCOUNTER — Encounter (INDEPENDENT_AMBULATORY_CARE_PROVIDER_SITE_OTHER): Payer: Self-pay

## 2012-04-09 DIAGNOSIS — J189 Pneumonia, unspecified organism: Secondary | ICD-10-CM

## 2012-04-09 HISTORY — DX: Pneumonia, unspecified organism: J18.9

## 2012-04-12 ENCOUNTER — Other Ambulatory Visit (HOSPITAL_COMMUNITY): Payer: Self-pay | Admitting: Family Medicine

## 2012-04-12 ENCOUNTER — Ambulatory Visit (HOSPITAL_COMMUNITY)
Admission: RE | Admit: 2012-04-12 | Discharge: 2012-04-12 | Disposition: A | Discharge status: Home / Self Care | Payer: Medicare Other | Source: Ambulatory Visit | Attending: Family Medicine | Admitting: Family Medicine

## 2012-04-12 DIAGNOSIS — M545 Low back pain, unspecified: Secondary | ICD-10-CM | POA: Insufficient documentation

## 2012-04-12 DIAGNOSIS — C189 Malignant neoplasm of colon, unspecified: Secondary | ICD-10-CM

## 2012-04-13 ENCOUNTER — Ambulatory Visit (HOSPITAL_COMMUNITY)
Admission: RE | Admit: 2012-04-13 | Discharge: 2012-04-13 | Disposition: A | Discharge status: Home / Self Care | Payer: Medicare Other | Source: Ambulatory Visit | Attending: Family Medicine | Admitting: Family Medicine

## 2012-04-13 DIAGNOSIS — E279 Disorder of adrenal gland, unspecified: Secondary | ICD-10-CM | POA: Insufficient documentation

## 2012-04-13 DIAGNOSIS — N200 Calculus of kidney: Secondary | ICD-10-CM | POA: Insufficient documentation

## 2012-04-13 DIAGNOSIS — R109 Unspecified abdominal pain: Secondary | ICD-10-CM | POA: Insufficient documentation

## 2012-04-13 DIAGNOSIS — C189 Malignant neoplasm of colon, unspecified: Secondary | ICD-10-CM

## 2012-04-13 DIAGNOSIS — R933 Abnormal findings on diagnostic imaging of other parts of digestive tract: Secondary | ICD-10-CM | POA: Insufficient documentation

## 2012-04-13 DIAGNOSIS — K449 Diaphragmatic hernia without obstruction or gangrene: Secondary | ICD-10-CM | POA: Insufficient documentation

## 2012-04-13 LAB — CREATININE, SERUM
Creatinine, Ser: 0.73 mg/dL (ref 0.50–1.10)
GFR calc Af Amer: >90 mL/min (ref >90)
GFR calc non Af Amer: 85 mL/min — ABNORMAL LOW (ref >90)

## 2012-04-13 MED ORDER — IOHEXOL 300 MG/ML  SOLN
100.0000 mL | Freq: Once | INTRAMUSCULAR | Status: AC | PRN
  Administered 2012-04-13: 100 mL via INTRAVENOUS

## 2012-05-02 ENCOUNTER — Emergency Department (HOSPITAL_COMMUNITY): Payer: Medicare Other

## 2012-05-02 ENCOUNTER — Encounter (HOSPITAL_COMMUNITY): Payer: Self-pay

## 2012-05-02 ENCOUNTER — Inpatient Hospital Stay (HOSPITAL_COMMUNITY)
Admission: EM | Admit: 2012-05-02 | Discharge: 2012-05-06 | DRG: 194 | Disposition: A | Discharge status: Home / Self Care | Payer: Medicare Other | Attending: Family Medicine | Admitting: Family Medicine

## 2012-05-02 DIAGNOSIS — R110 Nausea: Secondary | ICD-10-CM | POA: Diagnosis not present

## 2012-05-02 DIAGNOSIS — Z79899 Other long term (current) drug therapy: Secondary | ICD-10-CM

## 2012-05-02 DIAGNOSIS — G8929 Other chronic pain: Secondary | ICD-10-CM | POA: Diagnosis present

## 2012-05-02 DIAGNOSIS — A048 Other specified bacterial intestinal infections: Secondary | ICD-10-CM | POA: Diagnosis present

## 2012-05-02 DIAGNOSIS — B370 Candidal stomatitis: Secondary | ICD-10-CM | POA: Diagnosis not present

## 2012-05-02 DIAGNOSIS — J189 Pneumonia, unspecified organism: Principal | ICD-10-CM | POA: Diagnosis present

## 2012-05-02 DIAGNOSIS — Z981 Arthrodesis status: Secondary | ICD-10-CM

## 2012-05-02 DIAGNOSIS — Z87891 Personal history of nicotine dependence: Secondary | ICD-10-CM

## 2012-05-02 DIAGNOSIS — Z9181 History of falling: Secondary | ICD-10-CM

## 2012-05-02 DIAGNOSIS — K3184 Gastroparesis: Secondary | ICD-10-CM | POA: Diagnosis present

## 2012-05-02 DIAGNOSIS — K219 Gastro-esophageal reflux disease without esophagitis: Secondary | ICD-10-CM | POA: Diagnosis present

## 2012-05-02 DIAGNOSIS — R270 Ataxia, unspecified: Secondary | ICD-10-CM

## 2012-05-02 DIAGNOSIS — M545 Low back pain, unspecified: Secondary | ICD-10-CM | POA: Diagnosis present

## 2012-05-02 DIAGNOSIS — Z9049 Acquired absence of other specified parts of digestive tract: Secondary | ICD-10-CM

## 2012-05-02 DIAGNOSIS — Z7902 Long term (current) use of antithrombotics/antiplatelets: Secondary | ICD-10-CM

## 2012-05-02 DIAGNOSIS — E119 Type 2 diabetes mellitus without complications: Secondary | ICD-10-CM | POA: Diagnosis present

## 2012-05-02 DIAGNOSIS — Z8673 Personal history of transient ischemic attack (TIA), and cerebral infarction without residual deficits: Secondary | ICD-10-CM

## 2012-05-02 LAB — CBC WITH DIFFERENTIAL/PLATELET
Basophils Absolute: 0 10*3/uL (ref 0.0–0.1)
Basophils Relative: 0 % (ref 0–1)
Eosinophils Absolute: 0.2 10*3/uL (ref 0.0–0.7)
Eosinophils Relative: 2 % (ref 0–5)
HCT: 35.5 % — ABNORMAL LOW (ref 36.0–46.0)
Hemoglobin: 11.5 g/dL — ABNORMAL LOW (ref 12.0–15.0)
Lymphocytes Relative: 14 % (ref 12–46)
Lymphs Abs: 1.3 10*3/uL (ref 0.7–4.0)
MCH: 29.3 pg (ref 26.0–34.0)
MCHC: 32.4 g/dL (ref 30.0–36.0)
MCV: 90.6 fL (ref 78.0–100.0)
Monocytes Absolute: 0.5 10*3/uL (ref 0.1–1.0)
Monocytes Relative: 6 % (ref 3–12)
Neutro Abs: 6.8 10*3/uL (ref 1.7–7.7)
Neutrophils Relative %: 78 % — ABNORMAL HIGH (ref 43–77)
Platelets: 187 10*3/uL (ref 150–400)
RBC: 3.92 MIL/uL (ref 3.87–5.11)
RDW: 14.2 % (ref 11.5–15.5)
WBC: 8.7 10*3/uL (ref 4.0–10.5)

## 2012-05-02 LAB — GLUCOSE, CAPILLARY
Glucose-Capillary: 122 mg/dL — ABNORMAL HIGH (ref 70–99)
Glucose-Capillary: 163 mg/dL — ABNORMAL HIGH (ref 70–99)

## 2012-05-02 LAB — MRSA PCR SCREENING: MRSA by PCR: NEGATIVE

## 2012-05-02 LAB — COMPREHENSIVE METABOLIC PANEL
ALT: 13 U/L (ref 0–35)
AST: 15 U/L (ref 0–37)
Albumin: 3.1 g/dL — ABNORMAL LOW (ref 3.5–5.2)
Alkaline Phosphatase: 81 U/L (ref 39–117)
BUN: 10 mg/dL (ref 6–23)
CO2: 25 mEq/L (ref 19–32)
Calcium: 9.5 mg/dL (ref 8.4–10.5)
Chloride: 104 mEq/L (ref 96–112)
Creatinine, Ser: 0.7 mg/dL (ref 0.50–1.10)
GFR calc Af Amer: >90 mL/min (ref >90)
GFR calc non Af Amer: 86 mL/min — ABNORMAL LOW (ref >90)
Glucose, Bld: 141 mg/dL — ABNORMAL HIGH (ref 70–99)
Potassium: 3.9 mEq/L (ref 3.5–5.1)
Sodium: 138 mEq/L (ref 135–145)
Total Bilirubin: 0.4 mg/dL (ref 0.3–1.2)
Total Protein: 6.5 g/dL (ref 6.0–8.3)

## 2012-05-02 LAB — VITAMIN B12: Vitamin B-12: 1979 pg/mL — ABNORMAL HIGH (ref 211–911)

## 2012-05-02 LAB — SEDIMENTATION RATE: Sed Rate: 37 mm/hr — ABNORMAL HIGH (ref 0–22)

## 2012-05-02 LAB — RPR: RPR Ser Ql: NONREACTIVE

## 2012-05-02 LAB — TROPONIN I: Troponin I: <0.3 ng/mL (ref <0.30)

## 2012-05-02 LAB — MAGNESIUM: Magnesium: 1.7 mg/dL (ref 1.5–2.5)

## 2012-05-02 LAB — TSH: TSH: 1.592 u[IU]/mL (ref 0.350–4.500)

## 2012-05-02 MED ORDER — MECLIZINE HCL 12.5 MG PO TABS
12.5000 mg | ORAL_TABLET | Freq: Three times a day (TID) | ORAL | Status: DC
  Administered 2012-05-02 – 2012-05-06 (×11): 12.5 mg via ORAL
  Filled 2012-05-02 (×11): qty 1

## 2012-05-02 MED ORDER — AMOXICILLIN 500 MG PO CAPS
500.0000 mg | ORAL_CAPSULE | Freq: Three times a day (TID) | ORAL | Status: DC
  Administered 2012-05-02 – 2012-05-03 (×3): 500 mg via ORAL
  Filled 2012-05-02 (×9): qty 1

## 2012-05-02 MED ORDER — FENTANYL 50 MCG/HR TD PT72
50.0000 ug | MEDICATED_PATCH | TRANSDERMAL | Status: DC
  Administered 2012-05-02 – 2012-05-05 (×2): 50 ug via TRANSDERMAL
  Filled 2012-05-02 (×2): qty 1

## 2012-05-02 MED ORDER — METFORMIN HCL 500 MG PO TABS
500.0000 mg | ORAL_TABLET | Freq: Two times a day (BID) | ORAL | Status: DC
  Administered 2012-05-02 – 2012-05-06 (×9): 500 mg via ORAL
  Filled 2012-05-02 (×9): qty 1

## 2012-05-02 MED ORDER — ALPRAZOLAM 0.5 MG PO TABS
0.5000 mg | ORAL_TABLET | Freq: Three times a day (TID) | ORAL | Status: DC | PRN
  Administered 2012-05-03 – 2012-05-05 (×3): 0.5 mg via ORAL
  Filled 2012-05-02 (×3): qty 1

## 2012-05-02 MED ORDER — SODIUM CHLORIDE 0.9 % IV SOLN
INTRAVENOUS | Status: DC
  Administered 2012-05-02 – 2012-05-04 (×2): via INTRAVENOUS

## 2012-05-02 MED ORDER — CLOPIDOGREL BISULFATE 75 MG PO TABS
75.0000 mg | ORAL_TABLET | Freq: Every day | ORAL | Status: DC
  Administered 2012-05-03 – 2012-05-06 (×4): 75 mg via ORAL
  Filled 2012-05-02 (×4): qty 1

## 2012-05-02 MED ORDER — ENOXAPARIN SODIUM 40 MG/0.4ML ~~LOC~~ SOLN
40.0000 mg | Freq: Every day | SUBCUTANEOUS | Status: DC
  Administered 2012-05-02 – 2012-05-06 (×5): 40 mg via SUBCUTANEOUS
  Filled 2012-05-02 (×5): qty 0.4

## 2012-05-02 MED ORDER — LISINOPRIL 5 MG PO TABS: 5.0000 mg | ORAL_TABLET | Freq: Every day | ORAL | Status: DC

## 2012-05-02 MED ORDER — INSULIN ASPART 100 UNIT/ML ~~LOC~~ SOLN
0.0000 [IU] | Freq: Three times a day (TID) | SUBCUTANEOUS | Status: DC
  Administered 2012-05-04: 2 [IU] via SUBCUTANEOUS
  Administered 2012-05-04 – 2012-05-05 (×2): 1 [IU] via SUBCUTANEOUS

## 2012-05-02 MED ORDER — POLYSACCHARIDE IRON COMPLEX 150 MG PO CAPS
150.0000 mg | ORAL_CAPSULE | Freq: Every day | ORAL | Status: DC
  Administered 2012-05-02 – 2012-05-06 (×5): 150 mg via ORAL
  Filled 2012-05-02 (×6): qty 1

## 2012-05-02 MED ORDER — RAMIPRIL 5 MG PO CAPS: 5.0000 mg | ORAL_CAPSULE | Freq: Every day | ORAL | Status: DC

## 2012-05-02 MED ORDER — ENALAPRIL MALEATE 5 MG PO TABS
5.0000 mg | ORAL_TABLET | Freq: Every day | ORAL | Status: DC
  Administered 2012-05-02 – 2012-05-06 (×4): 5 mg via ORAL
  Filled 2012-05-02 (×4): qty 1

## 2012-05-02 MED ORDER — ASPIRIN 81 MG PO CHEW
81.0000 mg | CHEWABLE_TABLET | Freq: Every day | ORAL | Status: DC
  Filled 2012-05-02: qty 1

## 2012-05-02 MED ORDER — PANTOPRAZOLE SODIUM 40 MG PO TBEC
40.0000 mg | DELAYED_RELEASE_TABLET | Freq: Every day | ORAL | Status: DC
  Administered 2012-05-02: 40 mg via ORAL
  Filled 2012-05-02: qty 1

## 2012-05-02 MED ORDER — AMOXICILLIN 500 MG PO CAPS
ORAL_CAPSULE | ORAL | Status: AC
  Filled 2012-05-02: qty 3

## 2012-05-02 MED ORDER — CLARITHROMYCIN 500 MG PO TABS
500.0000 mg | ORAL_TABLET | Freq: Two times a day (BID) | ORAL | Status: DC
  Administered 2012-05-02 – 2012-05-03 (×3): 500 mg via ORAL
  Filled 2012-05-02 (×3): qty 1

## 2012-05-02 MED ORDER — SODIUM CHLORIDE 0.9 % IV SOLN
Freq: Once | INTRAVENOUS | Status: AC
  Administered 2012-05-02: 16:00:00 via INTRAVENOUS

## 2012-05-02 NOTE — ED Notes (Addendum)
Started having weakness to back and legs that began yesterday per pt. Also reports falling twice today. Face symmetrical. Bil UE grips weak. Slight drift present to rt leg. Alert and oriented to place.

## 2012-05-02 NOTE — Progress Notes (Signed)
Pharmacy Consult: Lovenox for VTE prophylaxis.  Patient Measurements: Height: 5' 1.02" (155 cm) Weight: 138 lb (62.596 kg) IBW/kg (Calculated) : 47.86  Body mass index is 26.05 kg/(m^2).   VITALS Filed Vitals:   05/02/12 1444  BP: 86/55  Pulse: 61  Temp:   Resp: 20    INR Last Three Days: No results found for this basename: INR:3 in the last 72 hours  CBC:    Component Value Date/Time   WBC 8.7 05/02/2012 1205   RBC 3.92 05/02/2012 1205   HGB 11.5* 05/02/2012 1205   HCT 35.5* 05/02/2012 1205   PLT 187 05/02/2012 1205   MCV 90.6 05/02/2012 1205   MCH 29.3 05/02/2012 1205   MCHC 32.4 05/02/2012 1205   RDW 14.2 05/02/2012 1205   LYMPHSABS 1.3 05/02/2012 1205   MONOABS 0.5 05/02/2012 1205   EOSABS 0.2 05/02/2012 1205   BASOSABS 0.0 05/02/2012 1205    RENAL FUNCTION: Estimated Creatinine Clearance: 55.6 ml/min (by C-G formula based on Cr of 0.7).  Assessment: Dose stable for age, weight, renal function and indication.  Plan: Lovenox 40mg  SQ daily. Sign off.  Mady Gemma, [facility] 05/02/2012 3:06 PM

## 2012-05-02 NOTE — ED Provider Notes (Signed)
History   This chart was scribed for Tammy Lennert, MD by Charolett Bumpers, ER Scribe. The patient was seen in room APA14/[facility]14. Patient's care was started at 1113.   CSN: [id number]  Arrival date & time 05/02/12  1038   First MD Initiated Contact with Patient 05/02/12 1113      Chief Complaint  Patient presents with  . Weakness  . Fall    COZETTE BRAGGS is a 71 y.o. female who presents to the Emergency Department complaining of generalized weakness that started yesterday afternoon. Her sister reports associated difficulty ambulating, dizziness, confusion and increased drowsiness. The pt states that when she woke up this morning, her symptoms were worse and has fallen twice today due to unsteady gait. She reports chronic lower back pain and wears a pain patch. Sister reports that the pt started a new medication that contains antibiotics 3 days ago. Sister reports that the pt has had increased drowsiness and confusion ever since.    Patient is a 71 y.o. female presenting with weakness. The history is provided by the patient and a relative. No language interpreter was used.  Weakness The primary symptoms include dizziness. Primary symptoms do not include headaches or seizures. The symptoms began yesterday. The symptoms are worsening. The neurological symptoms are diffuse. Context: new medication.  Dizziness also occurs with weakness.  Additional symptoms include weakness. Additional symptoms do not include hallucinations.    Past Medical History  Diagnosis Date  . Nausea   . Bloating   . Dysphagia   . Gastroesophageal reflux   . Gastroparesis   . Anemia   . Diabetes mellitus   . TIA (transient ischemic attack)   . PONV (postoperative nausea and vomiting)     Past Surgical History  Procedure Date  . Hemicolectomy ZIEGLER  . Colonoscopy 10/11/2010  . Colonoscopy 11/23/2009  . Colonoscopy 07/28/2008    W/SNARE  . Colonoscopy 06/28/07  . Colonoscopy 05/10/07   W/POLYP  . Colonoscopy 12/28/00  . Upper gastrointestinal endoscopy 10/11/2010    EGD ED  . Upper gastrointestinal endoscopy 11/23/2009  . Upper gastrointestinal endoscopy 05/10/07    EGD ED  . Upper gastrointestinal endoscopy 08/11/01    EGD ED  . Cervical spine surgery   . Abdominal surgery     Family History  Problem Relation Age of Onset  . Heart disease Mother   . Diabetes Mother   . Dementia Father   . Healthy Sister   . Diabetes Brother   . Kidney cancer Brother   . Diabetes Brother   . Neuropathy Brother   . Diabetes Daughter   . Diabetes Daughter   . Diabetes Son     History  Substance Use Topics  . Smoking status: Former Smoker    Types: Cigarettes    Quit date: 03/17/1991  . Smokeless tobacco: Never Used     Comment: Patient states that it has ben greater than 20 years since she quit smoking  . Alcohol Use: No    OB History    Grav Para Term Preterm Abortions TAB SAB Ect Mult Living                  Review of Systems  Constitutional: Negative for fatigue.  HENT: Negative for congestion, sinus pressure and ear discharge.   Eyes: Negative for discharge.  Respiratory: Negative for cough.   Cardiovascular: Negative for chest pain.  Gastrointestinal: Negative for abdominal pain and diarrhea.  Genitourinary: Negative for frequency and hematuria.  Musculoskeletal: Positive for back pain (chronic) and gait problem.  Skin: Negative for rash.  Neurological: Positive for dizziness and weakness. Negative for seizures and headaches.  Hematological: Negative.   Psychiatric/Behavioral: Positive for confusion. Negative for hallucinations.  All other systems reviewed and are negative.    Allergies  Codeine; Morphine; Ondansetron; Shellfish allergy; and Penicillins  Home Medications   Current Outpatient Rx  Name  Route  Sig  Dispense  Refill  . ALPRAZOLAM 1 MG PO TABS   Oral   Take 0.5 mg by mouth at bedtime as needed. Anxiety         . CALCIUM  CARBONATE-VITAMIN D 250-125 MG-UNIT PO TABS   Oral   Take 1 tablet by mouth daily.           Marland Kitchen CLOPIDOGREL BISULFATE 75 MG PO TABS   Oral   Take 75 mg by mouth daily.           . CYCLOBENZAPRINE HCL 10 MG PO TABS   Oral   Take 10 mg by mouth at bedtime. For muscle pain         . DEXLANSOPRAZOLE 60 MG PO CPDR   Oral   Take 60 mg by mouth daily.           . DULOXETINE HCL 60 MG PO CPEP   Oral   Take 60 mg by mouth daily.           . ENALAPRIL MALEATE 5 MG PO TABS   Oral   Take 5 mg by mouth daily.           Marland Kitchen FAMOTIDINE 40 MG PO TABS   Oral   Take 1 tablet (40 mg total) by mouth daily.   30 tablet   5     Take it 30-60 minutes daily before evening meal   . FENTANYL 50 MCG/HR TD PT72   Transdermal   Place 1 patch onto the skin every 3 (three) days.           Di Kindle SULFATE 325 (65 FE) MG PO TABS   Oral   Take 325 mg by mouth daily after supper.           Marland Kitchen LATANOPROST 0.005 % OP SOLN   Both Eyes   Place 1 drop into both eyes at bedtime.          Marland Kitchen METFORMIN HCL 1000 MG PO TABS   Oral   Take 1,000 mg by mouth daily.          Marland Kitchen MISC NATURAL PRODUCTS PO LIQD   Oral   Take 2 drops by mouth. Digestive Drops patient takes 2 drops every morning and 2 drops every night           BP 125/63  Pulse 63  Temp 98.2 F (36.8 C)  Resp 16  Wt 138 lb (62.596 kg)  SpO2 100%  Physical Exam  Constitutional: She is oriented to person, place, and time. She appears well-developed.  HENT:  Head: Normocephalic and atraumatic.  Eyes: Conjunctivae normal and EOM are normal. No scleral icterus.  Neck: Neck supple. No thyromegaly present.  Cardiovascular: Normal rate, regular rhythm and normal heart sounds.  Exam reveals no gallop and no friction rub.   No murmur heard. Pulmonary/Chest: Effort normal and breath sounds normal. No stridor. No respiratory distress. She has no wheezes. She has no rales. She exhibits no tenderness.  Abdominal: Soft. Bowel  sounds are normal. She exhibits no distension.  There is no tenderness. There is no rebound.  Musculoskeletal: Normal range of motion. She exhibits no edema.  Lymphadenopathy:    She has no cervical adenopathy.  Neurological: She is alert and oriented to person, place, and time. No cranial nerve deficit. Gait abnormal. Coordination normal.       Unsteady gait. Normal strength. Grip strength equal bilaterally.   Skin: No rash noted. No erythema.  Psychiatric: She has a normal mood and affect. Her behavior is normal.    ED Course  Procedures (including critical care time)  DIAGNOSTIC STUDIES: Oxygen Saturation is 100% on room air, normal by my interpretation.    COORDINATION OF CARE:  11:23-Discussed planned course of treatment with the patient including a chest x-ray, CT of head and blood work, who is agreeable at this time.   Results for orders placed during the hospital encounter of 05/02/12  CBC WITH DIFFERENTIAL      Component Value Range   WBC 8.7  4.0 - 10.5 K/uL   RBC 3.92  3.87 - 5.11 MIL/uL   Hemoglobin 11.5 (*) 12.0 - 15.0 g/dL   HCT 96.0 (*) 45.4 - 09.8 %   MCV 90.6  78.0 - 100.0 fL   MCH 29.3  26.0 - 34.0 pg   MCHC 32.4  30.0 - 36.0 g/dL   RDW 11.9  14.7 - 82.9 %   Platelets 187  150 - 400 K/uL   Neutrophils Relative 78 (*) 43 - 77 %   Neutro Abs 6.8  1.7 - 7.7 K/uL   Lymphocytes Relative 14  12 - 46 %   Lymphs Abs 1.3  0.7 - 4.0 K/uL   Monocytes Relative 6  3 - 12 %   Monocytes Absolute 0.5  0.1 - 1.0 K/uL   Eosinophils Relative 2  0 - 5 %   Eosinophils Absolute 0.2  0.0 - 0.7 K/uL   Basophils Relative 0  0 - 1 %   Basophils Absolute 0.0  0.0 - 0.1 K/uL  COMPREHENSIVE METABOLIC PANEL      Component Value Range   Sodium 138  135 - 145 mEq/L   Potassium 3.9  3.5 - 5.1 mEq/L   Chloride 104  96 - 112 mEq/L   CO2 25  19 - 32 mEq/L   Glucose, Bld 141 (*) 70 - 99 mg/dL   BUN 10  6 - 23 mg/dL   Creatinine, Ser 5.62  0.50 - 1.10 mg/dL   Calcium 9.5  8.4 - 13.0  mg/dL   Total Protein 6.5  6.0 - 8.3 g/dL   Albumin 3.1 (*) 3.5 - 5.2 g/dL   AST 15  0 - 37 U/L   ALT 13  0 - 35 U/L   Alkaline Phosphatase 81  39 - 117 U/L   Total Bilirubin 0.4  0.3 - 1.2 mg/dL   GFR calc non Af Amer 86 (*) >90 mL/min   GFR calc Af Amer >90  >90 mL/min  TROPONIN I      Component Value Range   Troponin I <0.30  <0.30 ng/mL    Dg Chest 1 View  05/02/2012  *RADIOLOGY REPORT*  Clinical Data: Dizziness and weakness.  CHEST - 1 VIEW  Comparison: PA and lateral chest 03/11/2011.  Findings: Subsegmental atelectasis is seen in the left lung base. Subtle airspace opacity is seen in the right upper lung zone. Heart size is normal.  No pneumothorax or pleural fluid.  IMPRESSION: Subtle airspace opacity right upper lung zone could  be due to pneumonia.  PA and lateral chest films would be useful for further evaluation.   Original Report Authenticated By: Holley Dexter, M.D.    Ct Head Wo Contrast  05/02/2012  *RADIOLOGY REPORT*  Clinical Data: 71 year old female syncope weakness fall altered level of consciousness.  CT HEAD WITHOUT CONTRAST  Technique:  Contiguous axial images were obtained from the base of the skull through the vertex without contrast.  Comparison: Cervical spine radiographs 05/08/2011.  Head CT 03/11/2011.  Findings: Posterior cerclage wire partially visible in the upper cervical spine.  Previous suboccipital decompression changes are stable.  Mild paranasal sinus mucosal thickening, decreased from prior.  Mastoids are clear. No acute osseous abnormality identified.  Visualized orbits and scalp soft tissues are within normal limits.  Dystrophic basal ganglia calcifications are stable.  Cerebral volume is stable.  No ventriculomegaly. No midline shift, mass effect, or evidence of mass lesion.  No acute intracranial hemorrhage identified.  Scattered subcortical white matter hypodensity is not significantly changed. No evidence of cortically based acute infarction  identified.  No suspicious intracranial vascular hyperdensity.  IMPRESSION: Stable postoperative and nonspecific subcortical white matter changes. No acute intracranial abnormality.   Original Report Authenticated By: Erskine Speed, M.D.      No diagnosis found.    Date: 05/02/2012  Rate: 63  Rhythm: normal sinus rhythm  QRS Axis: normal  Intervals: normal  ST/T Wave abnormalities: nonspecific ST changes  Conduction Disutrbances:none  Narrative Interpretation:   Old EKG Reviewed: none available    MDM    The chart was scribed for me under my direct supervision.  I personally performed the history, physical, and medical decision making and all procedures in the evaluation of this patient.Tammy Lennert, MD 05/02/12 [phone]

## 2012-05-02 NOTE — H&P (Signed)
[id number]  

## 2012-05-03 ENCOUNTER — Inpatient Hospital Stay (HOSPITAL_COMMUNITY): Payer: Medicare Other

## 2012-05-03 ENCOUNTER — Encounter (HOSPITAL_COMMUNITY): Payer: Self-pay

## 2012-05-03 LAB — CBC
HCT: 30.3 % — ABNORMAL LOW (ref 36.0–46.0)
Hemoglobin: 9.9 g/dL — ABNORMAL LOW (ref 12.0–15.0)
MCH: 29.9 pg (ref 26.0–34.0)
MCHC: 32.7 g/dL (ref 30.0–36.0)
MCV: 91.5 fL (ref 78.0–100.0)
Platelets: 156 10*3/uL (ref 150–400)
RBC: 3.31 MIL/uL — ABNORMAL LOW (ref 3.87–5.11)
RDW: 14.3 % (ref 11.5–15.5)
WBC: 7 10*3/uL (ref 4.0–10.5)

## 2012-05-03 LAB — BASIC METABOLIC PANEL
BUN: 7 mg/dL (ref 6–23)
BUN: 7 mg/dL (ref 6–23)
CO2: 24 mEq/L (ref 19–32)
CO2: 25 mEq/L (ref 19–32)
Calcium: 8.6 mg/dL (ref 8.4–10.5)
Calcium: 9.1 mg/dL (ref 8.4–10.5)
Chloride: 109 mEq/L (ref 96–112)
Chloride: 108 mEq/L (ref 96–112)
Creatinine, Ser: 0.65 mg/dL (ref 0.50–1.10)
Creatinine, Ser: 0.61 mg/dL (ref 0.50–1.10)
GFR calc Af Amer: >90 mL/min (ref >90)
GFR calc Af Amer: >90 mL/min (ref >90)
GFR calc non Af Amer: 88 mL/min — ABNORMAL LOW (ref >90)
GFR calc non Af Amer: 90 mL/min — ABNORMAL LOW (ref >90)
Glucose, Bld: 116 mg/dL — ABNORMAL HIGH (ref 70–99)
Glucose, Bld: 135 mg/dL — ABNORMAL HIGH (ref 70–99)
Potassium: 3.5 mEq/L (ref 3.5–5.1)
Potassium: 3.7 mEq/L (ref 3.5–5.1)
Sodium: 141 mEq/L (ref 135–145)
Sodium: 139 mEq/L (ref 135–145)

## 2012-05-03 LAB — GLUCOSE, CAPILLARY
Glucose-Capillary: 99 mg/dL (ref 70–99)
Glucose-Capillary: 102 mg/dL — ABNORMAL HIGH (ref 70–99)
Glucose-Capillary: 121 mg/dL — ABNORMAL HIGH (ref 70–99)
Glucose-Capillary: 145 mg/dL — ABNORMAL HIGH (ref 70–99)

## 2012-05-03 LAB — HEPATIC FUNCTION PANEL
ALT: 10 U/L (ref 0–35)
AST: 9 U/L (ref 0–37)
Albumin: 2.5 g/dL — ABNORMAL LOW (ref 3.5–5.2)
Alkaline Phosphatase: 65 U/L (ref 39–117)
Bilirubin, Direct: <0.1 mg/dL (ref 0.0–0.3)
Total Bilirubin: 0.2 mg/dL — ABNORMAL LOW (ref 0.3–1.2)
Total Protein: 5.3 g/dL — ABNORMAL LOW (ref 6.0–8.3)

## 2012-05-03 LAB — AMMONIA: Ammonia: 48 umol/L (ref 11–60)

## 2012-05-03 MED ORDER — ENSURE COMPLETE PO LIQD
237.0000 mL | Freq: Two times a day (BID) | ORAL | Status: DC
  Administered 2012-05-03 – 2012-05-05 (×4): 237 mL via ORAL

## 2012-05-03 MED ORDER — PANTOPRAZOLE SODIUM 40 MG PO TBEC
40.0000 mg | DELAYED_RELEASE_TABLET | Freq: Two times a day (BID) | ORAL | Status: DC
  Administered 2012-05-03 – 2012-05-06 (×7): 40 mg via ORAL
  Filled 2012-05-03 (×7): qty 1

## 2012-05-03 MED ORDER — ACETAMINOPHEN 325 MG PO TABS
650.0000 mg | ORAL_TABLET | Freq: Four times a day (QID) | ORAL | Status: DC | PRN
  Administered 2012-05-03 – 2012-05-06 (×5): 650 mg via ORAL
  Filled 2012-05-03 (×3): qty 2
  Filled 2012-05-03: qty 1
  Filled 2012-05-03 (×2): qty 2

## 2012-05-03 MED ORDER — DEXTROSE 5 % IV SOLN
500.0000 mg | INTRAVENOUS | Status: DC
  Administered 2012-05-03 – 2012-05-04 (×2): 500 mg via INTRAVENOUS
  Filled 2012-05-03 (×2): qty 500

## 2012-05-03 MED ORDER — DEXTROSE 5 % IV SOLN
1.0000 g | INTRAVENOUS | Status: DC
  Administered 2012-05-03 – 2012-05-05 (×3): 1 g via INTRAVENOUS
  Filled 2012-05-03 (×4): qty 10

## 2012-05-03 NOTE — Progress Notes (Signed)
NAMEJALIA, [name]               ACCOUNT NO.:  [id number]  MEDICAL RECORD NO.:  [id number]  LOCATION:  IC11                          FACILITY:  [facility]  PHYSICIAN:  Mila Homer. Sudie Bailey, M.D.DATE OF BIRTH:  [birth year]  DATE OF PROCEDURE:  05/03/2012 DATE OF DISCHARGE:                                PROGRESS NOTE   This 71 year old was admitted to the hospital yesterday with severe fatigue.  She had been falling asleep readily and frequently at home, which is unusual for her, this starting several days prior to admission. She had generalized weakness to the point where when she tried to get out of bed the day of admission she fell on the floor.  She had recently been started on Biaxin and amoxicillin for an H pylori infection of the stomach confirmed by antibody levels and also by fecal antigen test.  Today she feels slightly better on IV fluids.  She is urinating a lot.  OBJECTIVE:  VITAL SIGNS:  She is supine in bed.  Temperature is 98.6, pulse 63, respiratory rate 15, blood pressure 74/40, but was 125/63 earlier. GENERAL:  She is well developed and thin. HEART:  Regular rhythm with a rate of 70 currently. LUNGS:  Clear throughout and she is moving air well. ABDOMEN:  Soft without organomegaly or mass or tenderness. EXTREMITIES:  She has no edema of the ankles.  Review of the record showed she had an admission white cell count 8700, of which 78% were neutrophils, and a hemoglobin of 11.5.  Troponin was less than 0.30.  Glucose 141 and BUN is 10 with a creatinine of 0.70. TSH was normal at 1.592.  Chest x-ray showed "subtle airspace opacity" right upper lung zone, felt to be possibly secondary to pneumonia.  ASSESSMENT: 1. Malaise and fatigue, question etiology. 2. Helicobacter pylori infection. 3. Diabetes. 4. Chronic pain. 5. Gastroesophageal reflux disease. 6. Abnormal chest x-ray.  PLAN:  Will redo a chest x-ray, PA and lateral, today.  I have decreased her IV  fluids from 75 to 50 of normal saline.  Physical Therapy will need to try to get her up and ambulating to get her strength back.  I also asked her about vertigo, which she actually denied any dizziness, but just said that she is just terribly tired and fatigued and would fall asleep at a moment's notice, which is unusual for her.  At present, will continue her on clarithromycin and amoxicillin, but I am increasing pantoprazole from 40 daily to 40 b.i.d.     Mila Homer. Sudie Bailey, M.D.     SDK/MEDQ  D:  05/03/2012  T:  05/03/2012  Job:  [id number]

## 2012-05-03 NOTE — Progress Notes (Signed)
UR Chart Review Completed  

## 2012-05-03 NOTE — H&P (Signed)
NAMEARCIE, CHAIT NO.:  [id number]  MEDICAL RECORD NO.:  [id number]  LOCATION:  IC11                          FACILITY:  [facility]  PHYSICIAN:  Melvyn Novas, MDDATE OF BIRTH:  [birth year]  DATE OF ADMISSION:  05/02/2012 DATE OF DISCHARGE:  LH                             HISTORY & PHYSICAL   HISTORY OF PRESENT ILLNESS:  The patient is a 71 year old white female, patient of Dr. Sudie Bailey who has 3 days ago been started on multiple antibiotics and antacids for treatment of H. pylori and she also has chronic back pain and fentanyl patch, which is not new for her.  The patient is a poor historian, but the best I could found her out.  She had been driving yesterday and had an episode of dizziness, which I would best describe as vertigo and had to pull it truck over in a parking lot.  She then today had recurrent episodes of dizziness, but mostly accompanied with leg weakness and inability to ambulate.  She said her legs were dizzy, but it seems she has a combination of leg weakness, chronic low back pain, as well as vertigo.  She will be admitted for the above.  Her blood counts and blood pressure seem hemodynamically stable and quite normal and we will elect to continue her on her H. pylori therapy.  Her fentanyl patch with the addition of low-dose meclizine and see how she progresses with physical therapy.  PAST MEDICAL HISTORY:  Significant for gastroesophageal reflux, gastroparesis, anemia, diabetes mellitus, and TIA.  PAST SURGICAL HISTORY:  Remarkable for hemicolectomy, colonoscopy, EGD, and she has had cervical spine fusion, I believe.  SOCIAL HISTORY:  She is a former smoker.  Has not smoked recently and does not imbibe alcohol.  Lives at home and several family members living with her.  CURRENT MEDICINES:  Include Xanax 1 mg or 0.5 mg t.i.d. p.r.n., calcium carbonate and vitamin D 250/125 daily, Plavix 75 p.o. daily, cyclobenzaprine 10 mg at  bedtime, Protonix 40 mg per day, enalapril 5 mg p.o. daily, famotidine 40 mg p.o. daily, fentanyl patch 50 mcg/hr q.72 h., ferrous sulfate 325 daily, and metformin 1000 mg p.o. b.i.d.  REVIEW OF SYSTEMS:  Negative for seizures, tremors, polyuria, polydipsia, nausea, vomiting, melena, hematemesis, hematochezia, angina, palpitations, or syncope.  PHYSICAL EXAMINATION:  VITAL SIGNS:  Blood pressure at present is 125/64, no orthostatic changes.  Temperature 98.2, pulse 61 and regular, respiratory rate is 20, O2 sat is 95%. HEENT:  Eyes PERRLA intact.  Sclerae clear.  Conjunctivae pink. NECK:  No JVD.  No carotid bruits.  No thyromegaly.  No thyroid bruits. LUNGS:  Clear to A and P with prolonged expiratory phase.  No rales, wheeze, or rhonchi appreciable. HEART:  Rhythm rate and rhythm.  No murmurs, gallops, heaves, thrills, or rubs. ABDOMEN:  Soft, nontender.  Bowel sounds normoactive.  No guarding, rebound, mass, or megaly. EXTREMITIES:  Trace to 1+ pedal edema.  Peripheral pulses 1+ intact bilaterally.  Cranial nerves II through XII grossly intact.  The patient moves all 4 extremities.  Plantars are downgoing.  Hemoglobin 11.5, creatinine 0.70, potassium normal.  Her CT scan of the head was  unremarkable for any new insult.  IMPRESSION: 1. New onset vertigo. 2. Leg weakness, generalized debility. 3. Chronic low back pain. 4. Spinal abnormalities cervical spine status post fusion. 5. Helicobacter pylori positivity on current therapy 3 days. 6. Diabetes. 7. Fentanyl patch, opioid analgesia. 8. Gastroesophageal reflux disease.  PLAN:  Right now is to continue Biaxin and amoxicillin as an inotrope therapy.  Continue fentanyl patch.  We will add meclizine 12.5 p.o. t.i.d.  Continue all current medicines and make further recommendations as the database expands.     Melvyn Novas, MD     RMD/MEDQ  D:  05/02/2012  T:  05/03/2012  Job:  [id number]

## 2012-05-03 NOTE — Progress Notes (Signed)
INITIAL ADULT NUTRITION ASSESSMENT Date: 05/03/2012   Time: 1:39 PM Reason for Assessment: Malnutrition Screen  ASSESSMENT: Female 71 y.o.  Dx: Ataxia   Past Medical History  Diagnosis Date  . Nausea   . Bloating   . Dysphagia   . Gastroesophageal reflux   . Gastroparesis   . Anemia   . Diabetes mellitus   . TIA (transient ischemic attack)   . PONV (postoperative nausea and vomiting)     Scheduled Meds:   . [COMPLETED] sodium chloride   Intravenous Once  . amoxicillin  500 mg Oral Q8H  . clarithromycin  500 mg Oral Q12H  . clopidogrel  75 mg Oral Q breakfast  . enalapril  5 mg Oral Daily  . enoxaparin (LOVENOX) injection  40 mg Subcutaneous Daily  . fentaNYL  50 mcg Transdermal Q72H  . insulin aspart  0-9 Units Subcutaneous TID WC  . iron polysaccharides  150 mg Oral Daily  . meclizine  12.5 mg Oral TID  . metFORMIN  500 mg Oral BID  . pantoprazole  40 mg Oral BID  . [DISCONTINUED] aspirin  81 mg Oral Daily  . [DISCONTINUED] lisinopril  5 mg Oral Daily  . [DISCONTINUED] pantoprazole  40 mg Oral Q1200  . [DISCONTINUED] ramipril  5 mg Oral Daily   Continuous Infusions:   . sodium chloride 50 mL/hr at 05/03/12 1300   PRN Meds:.ALPRAZolam  Ht: 5\' 1"  (154.9 cm)  Wt: 145 lb 8.1 oz (66 kg)  Ideal Wt: 47.8 kg  % Ideal Wt: 138%  Usual Wt: 138# per pt Wt Readings from Last 10 Encounters:  05/02/12 145 lb 8.1 oz (66 kg)  12/02/11 138 lb 12.8 oz (62.959 kg)  10/10/11 144 lb (65.318 kg)  10/07/11 144 lb 9.6 oz (65.59 kg)  08/04/11 141 lb 4.8 oz (64.093 kg)  03/17/11 143 lb (64.864 kg)  03/11/11 140 lb (63.504 kg)  08/11/10 141 lb (63.957 kg)  02/07/08 141 lb (63.957 kg)    Body mass index is 27.49 kg/(m^2). Overweight  Food/Nutrition Related Hx: Pt c/o poor appetite and says that her weight and appetite varies. No recent significant wt change. Chewing difficulty also reported. Feeds herself. She does not meet criteria for malnutrition at this  time.   CMP     Component Value Date/Time   NA 139 05/03/2012 0945   NA 139 07/01/2010   K 3.7 05/03/2012 0945   CL 108 05/03/2012 0945   CO2 25 05/03/2012 0945   GLUCOSE 135* 05/03/2012 0945   BUN 7 05/03/2012 0945   CREATININE 0.61 05/03/2012 0945   CALCIUM 9.1 05/03/2012 0945   PROT 5.3* 05/03/2012 0449   ALBUMIN 2.5* 05/03/2012 0449   AST 9 05/03/2012 0449   ALT 10 05/03/2012 0449   ALKPHOS 65 05/03/2012 0449   BILITOT 0.2* 05/03/2012 0449   GFRNONAA 90* 05/03/2012 0945   GFRAA >90 05/03/2012 0945    Intake/Output Summary (Last 24 hours) at 05/03/12 1344 Last data filed at 05/03/12 1331  Gross per 24 hour  Intake   2150 ml  Output   3600 ml  Net  -1450 ml   *output noted  Diet Order: Sodium Restricted  Supplements/Tube Feeding:none at this time  IVF:    sodium chloride Last Rate: 50 mL/hr at 05/03/12 1300    Estimated Nutritional Needs:   Kcal:1500-1650 kcal Protein:60-70 gr Fluid:1 ml/kcal  NUTRITION DIAGNOSIS: -Inadequate oral intake (NI-2.1).  Status: Ongoing  RELATED TO: variable appeitite  AS EVIDENCE BY: meal intake  50%  MONITORING/EVALUATION(Goals): Monitor meals and supplement intake Goal: Pt to meet >/= 90% of their estimated nutrition needs; not met  EDUCATION NEEDS: -Education needs addressed  INTERVENTION: -Ensure Complete po BID, each supplement provides 350 kcal and 13 grams of protein. -Downgrade diet to low Sodium/ Mechanical Soft (chopped meats) per pt preference  Dietitian [phone]  DOCUMENTATION CODES Per approved criteria  -Not Applicable    Francene Boyers 05/03/2012, 1:39 PM

## 2012-05-03 NOTE — Care Management Note (Unsigned)
    Page 1 of 1   05/03/2012     2:39:06 PM   CARE MANAGEMENT NOTE 05/03/2012  Patient:  [name], [name]   Account Number:  [id number]  Date Initiated:  05/03/2012  Documentation initiated by:  Sharrie Rothman  Subjective/Objective Assessment:   Pt admitted from home with lightheadness and weakness. Pt lives with her grandson and his family. Pt was independent prior to admission. Pt will return home at discharge.     Action/Plan:   Will continue to follow for [facility] needs. Pt may benefit from [facility] PT at discharge.   Anticipated DC Date:  05/05/2012   Anticipated DC Plan:  HOME W HOME HEALTH SERVICES      DC Planning Services  CM consult      Choice offered to / List presented to:             Status of service:  In process, will continue to follow Medicare Important Message given?   (If response is "NO", the following Medicare IM given date fields will be blank) Date Medicare IM given:   Date Additional Medicare IM given:    Discharge Disposition:    Per UR Regulation:    If discussed at Long Length of Stay Meetings, dates discussed:    Comments:  05/03/12 1440 Arlyss Queen, RN BSN CM

## 2012-05-03 NOTE — Evaluation (Signed)
Physical Therapy Evaluation Patient Details Name: [name] MRN: [id number] DOB: 07/22/1940 Today's Date: 05/03/2012 Time: 5621-3086 PT Time Calculation (min): 33 min  PT Assessment / Plan / Recommendation Clinical Impression  Pt with increased vertigo with position change.      PT Assessment  Patient needs continued PT services    Follow Up Recommendations  Outpatient PT    Does the patient have the potential to tolerate intense rehabilitation    N/A  Barriers to Discharge  none      Equipment Recommendations  None recommended by PT    Recommendations for Other Services     Frequency Min 3X/week    Precautions / Restrictions Precautions Precautions: Fall Restrictions Weight Bearing Restrictions: No         Mobility  Bed Mobility Bed Mobility: Supine to Sit Supine to Sit: 6: Modified independent (Device/Increase time);7: Independent (with complaint of increased dizziness) Transfers Transfers: Sit to Stand;Stand to Sit Sit to Stand: 7: Independent (c/o increased dizziness with standing; stood 1 min a amb.) Stand to Sit: 6: Modified independent (Device/Increase time) Ambulation/Gait Ambulation/Gait Assistance: 6: Modified independent (Device/Increase time) Ambulation Distance (Feet): 100 Feet Assistive device: None Ambulation/Gait Assistance Details: decreased stride length. Gait Pattern: Decreased step length - right;Decreased step length - left Gait velocity: slow Stairs: No           PT Diagnosis: Generalized weakness;Difficulty walking  PT Problem List: Decreased activity tolerance PT Treatment Interventions: Therapeutic exercise;Therapeutic activities;Stair training (vestibular training)   PT Goals Acute Rehab PT Goals PT Goal Formulation: With patient Time For Goal Achievement: 05/05/12 Potential to Achieve Goals: Good Pt will go Supine/Side to Sit: Independently Pt will Go Up / Down Stairs: 3-5 stairs;with rail(s);with modified  independence PT Goal: Up/Down Stairs - Progress: Goal set today Pt will Perform Home Exercise Program: Independently (did not start exercises as pt went down to radiology) PT Goal: Perform Home Exercise Program - Progress: Goal set today  Visit Information  Last PT Received On: 05/03/12    Subjective Data  Subjective: Pt states she had been dizzy for 2-3 days.  Normallly does not use an assistive device Patient Stated Goal: To go home   Prior Functioning  Home Living Lives With: Family Available Help at Discharge: Family Type of Home: House Home Access: Stairs to enter Secretary/administrator of Steps: 4 Entrance Stairs-Rails: Right Home Layout: One level Bathroom Shower/Tub: Engineer, manufacturing systems: Standard Home Adaptive Equipment: None Prior Function Level of Independence: Independent Able to Take Stairs?: Yes Driving: Yes Vocation: Retired Musician: No difficulties Dominant Hand: Right    Cognition  Overall Cognitive Status: Appears within functional limits for tasks assessed/performed Arousal/Alertness: Awake/alert Behavior During Session: [facility] for tasks performed    Extremity/Trunk Assessment Right Lower Extremity Assessment RLE ROM/Strength/Tone: Deficits RLE ROM/Strength/Tone Deficits: generally 3/5 B Left Lower Extremity Assessment LLE ROM/Strength/Tone: Deficits LLE ROM/Strength/Tone Deficits: generally 3/5   Balance    End of Session PT - End of Session Equipment Utilized During Treatment: Gait belt Activity Tolerance: Patient tolerated treatment well Patient left:  (x-ray to floor to take pt downstairs) Nurse Communication: Mobility status  GP     RUSSELL,CINDY 05/03/2012, 10:29 AM

## 2012-05-04 LAB — HEPATIC FUNCTION PANEL
ALT: 10 U/L (ref 0–35)
AST: 10 U/L (ref 0–37)
Albumin: 2.7 g/dL — ABNORMAL LOW (ref 3.5–5.2)
Alkaline Phosphatase: 70 U/L (ref 39–117)
Bilirubin, Direct: <0.1 mg/dL (ref 0.0–0.3)
Total Bilirubin: 0.2 mg/dL — ABNORMAL LOW (ref 0.3–1.2)
Total Protein: 5.8 g/dL — ABNORMAL LOW (ref 6.0–8.3)

## 2012-05-04 LAB — GLUCOSE, CAPILLARY
Glucose-Capillary: 112 mg/dL — ABNORMAL HIGH (ref 70–99)
Glucose-Capillary: 190 mg/dL — ABNORMAL HIGH (ref 70–99)
Glucose-Capillary: 149 mg/dL — ABNORMAL HIGH (ref 70–99)
Glucose-Capillary: 121 mg/dL — ABNORMAL HIGH (ref 70–99)

## 2012-05-04 LAB — BASIC METABOLIC PANEL
BUN: 5 mg/dL — ABNORMAL LOW (ref 6–23)
CO2: 24 mEq/L (ref 19–32)
Calcium: 9.4 mg/dL (ref 8.4–10.5)
Chloride: 109 mEq/L (ref 96–112)
Creatinine, Ser: 0.72 mg/dL (ref 0.50–1.10)
GFR calc Af Amer: >90 mL/min (ref >90)
GFR calc non Af Amer: 85 mL/min — ABNORMAL LOW (ref >90)
Glucose, Bld: 117 mg/dL — ABNORMAL HIGH (ref 70–99)
Potassium: 3.7 mEq/L (ref 3.5–5.1)
Sodium: 142 mEq/L (ref 135–145)

## 2012-05-04 MED ORDER — PROMETHAZINE HCL 12.5 MG PO TABS
25.0000 mg | ORAL_TABLET | ORAL | Status: DC | PRN
  Administered 2012-05-04 – 2012-05-06 (×6): 25 mg via ORAL
  Filled 2012-05-04: qty 1
  Filled 2012-05-04: qty 2
  Filled 2012-05-04 (×2): qty 1
  Filled 2012-05-04 (×2): qty 2
  Filled 2012-05-04 (×3): qty 1

## 2012-05-04 NOTE — Progress Notes (Signed)
[name], [name]               ACCOUNT NO.:  [id number]  MEDICAL RECORD NO.:  [id number]  LOCATION:  IC11                          FACILITY:  [facility]  PHYSICIAN:  Mila Homer. Sudie Bailey, M.D.DATE OF BIRTH:  [birth year]  DATE OF PROCEDURE: DATE OF DISCHARGE:                                PROGRESS NOTE   SUBJECTIVE:  She feels somewhat better today.  She gave me more history.  She started getting sick about a week ago. She started with a cough.  She was running chills but no fever.  OBJECTIVE:  VITAL SIGNS:  Today temperature is 97.9 but was 100.4 last night.  Her respiratory rate is 14 and pulse is 70, blood pressure 122/60.  I initially saw her walking in the hall with the assistance of physical therapy.  She seemed to do well as long as she had a little bit of support by the physical therapist, and later by myself as well. Sitting down she was not breathless.  She was moving air well and her color was good.  Her speech was normal.  Her lungs appeared to be fairly clear throughout, and her heart had a regular rhythm rate of about 70.  The 2 view chest x-ray showed a "subtle hazy opacification" over the right upper lobe unchanged from May 02, 2012, and felt to be possibly due to early infection.  I personally reviewed the earlier x- ray with the radiologist before getting this x-ray and he did feel that she could easily have pneumonia.  ASSESSMENT: 1. Probable pneumonia. 2. Malaise and fatigue secondary to pneumonia.  Notable, she really     did not really have real vertigo just terrible weakness when she     tried to get up and move around. 3. Helicobacter pylori infection. 4. Diabetes. 5. Chronic pain. 6. Gastroesophageal reflux disease.  PLAN:  I started her on ceftriaxone and azithromycin yesterday, both IV and I stopped the clarithromycin and amoxicillin temporarily.  We will continue her on these antibiotics another day or 2 and hopefully discharge her home on p.o.  antibiotics at that time and plan to resume the treatment for Helicobacter pylori later.     Mila Homer. Sudie Bailey, M.D.     SDK/MEDQ  D:  05/04/2012  T:  05/04/2012  Job:  [id number]

## 2012-05-04 NOTE — Progress Notes (Signed)
Physical Therapy Treatment Patient Details Name: [name] MRN: [id number] DOB: [birth year] Today's Date: 05/04/2012 Time: 8469-6295 PT Time Calculation (min): 45 min  PT Assessment / Plan / Recommendation Comments on Treatment Session   Pt tolerated well.  No loss of balance noted.    Follow Up Recommendations   none     Does the patient have the potential to tolerate intense rehabilitation   N/A  Barriers to Discharge  none      Equipment Recommendations    none   Recommendations for Other Services  none  Frequency   TIW  Plan   ex /gt   Precautions / Restrictions   none  Pertinent Vitals/Pain 0/10    Mobility  Bed Mobility Bed Mobility: Supine to Sit Supine to Sit: 6: Modified independent (Device/Increase time) Transfers Transfers: Sit to Stand Sit to Stand: 6: Modified independent (Device/Increase time) Stand to Sit: 7: Independent Ambulation/Gait Ambulation/Gait Assistance: 6: Modified independent (Device/Increase time) Ambulation Distance (Feet): 120 Feet Assistive device: None Gait Pattern: Within Functional Limits Gait velocity: improving    Exercises General Exercises - Lower Extremity Ankle Circles/Pumps: AROM;Both;10 reps Gluteal Sets: 15 reps (bridge) Long Arc Quad: Strengthening;Both;15 reps Hip ABduction/ADduction: Strengthening;15 reps;Sidelying Straight Leg Raises: Strengthening;15 reps;Supine Heel Raises: Strengthening;15 reps;Supine   PT Diagnosis: Generalized weakness  PT Problem List: Decreased strength PT Treatment Interventions:     PT Goals Acute Rehab PT Goals PT Goal: Supine/Side to Sit - Progress: Progressing toward goal PT Goal: Up/Down Stairs - Progress: Not met PT Goal: Perform Home Exercise Program - Progress: Progressing toward goal  Visit Information  Last PT Received On: 05/04/12    Subjective Data  Subjective: Pt states she is not having any dizziness when supine.  Sitting up dizziness increases to 5/10; smooth  pursuit of eyes cause no change of pain; Pt - with saccades.   Cognition       Balance     End of Session PT - End of Session Equipment Utilized During Treatment: Gait belt Activity Tolerance: Patient tolerated treatment well Patient left: in chair;with call bell/phone within reach   GP     RUSSELL,CINDY 05/04/2012, 9:28 AM

## 2012-05-04 NOTE — Progress Notes (Signed)
Reported to Dagoberto Ligas, RN on Dept. 300, transferred to 312 in stable condition via w/c with staff.

## 2012-05-04 NOTE — Progress Notes (Signed)
Dr. Michelle Nasuti cell called and message left concerning pt's c/o nausea. Will continue to monitor.

## 2012-05-05 LAB — GLUCOSE, CAPILLARY
Glucose-Capillary: 118 mg/dL — ABNORMAL HIGH (ref 70–99)
Glucose-Capillary: 129 mg/dL — ABNORMAL HIGH (ref 70–99)
Glucose-Capillary: 99 mg/dL (ref 70–99)
Glucose-Capillary: 148 mg/dL — ABNORMAL HIGH (ref 70–99)

## 2012-05-05 LAB — BASIC METABOLIC PANEL
BUN: 4 mg/dL — ABNORMAL LOW (ref 6–23)
CO2: 26 mEq/L (ref 19–32)
Calcium: 9.5 mg/dL (ref 8.4–10.5)
Chloride: 107 mEq/L (ref 96–112)
Creatinine, Ser: 0.78 mg/dL (ref 0.50–1.10)
GFR calc Af Amer: >90 mL/min (ref >90)
GFR calc non Af Amer: 83 mL/min — ABNORMAL LOW (ref >90)
Glucose, Bld: 118 mg/dL — ABNORMAL HIGH (ref 70–99)
Potassium: 3.7 mEq/L (ref 3.5–5.1)
Sodium: 142 mEq/L (ref 135–145)

## 2012-05-05 LAB — HEPATIC FUNCTION PANEL
ALT: 10 U/L (ref 0–35)
AST: 9 U/L (ref 0–37)
Albumin: 2.9 g/dL — ABNORMAL LOW (ref 3.5–5.2)
Alkaline Phosphatase: 72 U/L (ref 39–117)
Bilirubin, Direct: <0.1 mg/dL (ref 0.0–0.3)
Total Bilirubin: 0.2 mg/dL — ABNORMAL LOW (ref 0.3–1.2)
Total Protein: 6 g/dL (ref 6.0–8.3)

## 2012-05-05 LAB — FOLATE: Folate: 9.5 ng/mL (ref >5.4)

## 2012-05-05 MED ORDER — AZITHROMYCIN 250 MG PO TABS
500.0000 mg | ORAL_TABLET | Freq: Every day | ORAL | Status: DC
  Administered 2012-05-05: 500 mg via ORAL
  Filled 2012-05-05: qty 2

## 2012-05-05 NOTE — Progress Notes (Signed)
Tammy, [name]               ACCOUNT NO.:  [id number]  MEDICAL RECORD NO.:  [id number]  LOCATION:  A312                          FACILITY:  [facility]  PHYSICIAN:  Mila Homer. Sudie Bailey, M.D.DATE OF BIRTH:  [birth year]  DATE OF PROCEDURE: DATE OF DISCHARGE:                                PROGRESS NOTE   SUBJECTIVE:  A 71 year old, feels somewhat better.  She was able to be up yesterday in a chair and was able to walked some.  Unfortunately, she told she had a bad night and was nauseated.  We put her on medication for this.  OBJECTIVE:  VITAL SIGNS:  Temperature is 98.1, pulse 104, respiratory 16, blood pressure 156/85. GENERAL:  She is currently sitting up in a chair eating breakfast.  Her color is good and she looks more energetic.  Her speech is normal. LUNGS:  Appear to be clear throughout.  She is moving air well without intercostal retraction or use of accessory muscles of respiration. HEART:  A regular rhythm.  Rate of 80.  Blood tests today showed a CMP which is essentially normal except for an albumin of 2.9.  ASSESSMENT: 1. Pneumonia. 2. Generalized malaise, probably secondary to infection.  PLAN:  She is to be up in the chair more today and move around more.  If she continues to do well, I will be able to discharge her tomorrow on p.o. antibiotics.     Mila Homer. Sudie Bailey, M.D.     SDK/MEDQ  D:  05/05/2012  T:  05/05/2012  Job:  [id number]

## 2012-05-05 NOTE — Progress Notes (Signed)
PHARMACIST - PHYSICIAN COMMUNICATION DR:   Sudie Bailey CONCERNING: Antibiotic IV to Oral Route Change Policy  RECOMMENDATION: This patient is receiving Zithromax by the intravenous route.  Based on criteria approved by the Pharmacy and Therapeutics Committee, the antibiotic(s) is/are being converted to the equivalent oral dose form(s).  DESCRIPTION: These criteria include:  Patient being treated for a respiratory tract infection, urinary tract infection, or cellulitis  The patient is not neutropenic and does not exhibit a GI malabsorption state  The patient is eating (either orally or via tube) and/or has been taking other orally administered medications for a least 24 hours  The patient is improving clinically and has a Tmax < 100.5  If you have questions about this conversion, please contact the Pharmacy Department  [x]   [phone] )  [facility] []   [phone] )  [facility]  []   [phone] )  [facility] []   [phone] )  [facility]    S. Margo Aye, PharmD

## 2012-05-05 NOTE — Plan of Care (Signed)
Problem: Phase I Progression Outcomes Goal: OOB as tolerated unless otherwise ordered Outcome: Completed/Met Date Met:  05/05/12 Pt up to chair today and ambulating to bathroom.  Pt tolerating well.

## 2012-05-05 NOTE — Plan of Care (Signed)
Problem: Phase I Progression Outcomes Goal: OOB as tolerated unless otherwise ordered Outcome: Completed/Met Date Met:  05/05/12 Pt up to chair today and ambulating to bathroom.  Pt tolerated well.

## 2012-05-05 NOTE — Plan of Care (Signed)
Problem: Phase I Progression Outcomes Goal: Initial discharge plan identified Outcome: Completed/Met Date Met:  05/05/12 Return home with grandson and family at discharge.

## 2012-05-05 NOTE — Progress Notes (Signed)
PT Cancellation Note  Patient Details Name: [name] MRN: [id number] DOB: 1941-02-04   Cancelled Treatment:    Reason Eval/Treat Not Completed:  (has refused x2 due to phone call and fatigue)   Myrlene Broker L 05/05/2012, 10:16 AM

## 2012-05-06 LAB — BASIC METABOLIC PANEL
BUN: 5 mg/dL — ABNORMAL LOW (ref 6–23)
CO2: 27 mEq/L (ref 19–32)
Calcium: 10 mg/dL (ref 8.4–10.5)
Chloride: 104 mEq/L (ref 96–112)
Creatinine, Ser: 0.72 mg/dL (ref 0.50–1.10)
GFR calc Af Amer: >90 mL/min (ref >90)
GFR calc non Af Amer: 85 mL/min — ABNORMAL LOW (ref >90)
Glucose, Bld: 123 mg/dL — ABNORMAL HIGH (ref 70–99)
Potassium: 3.5 mEq/L (ref 3.5–5.1)
Sodium: 141 mEq/L (ref 135–145)

## 2012-05-06 LAB — CBC
HCT: 34.5 % — ABNORMAL LOW (ref 36.0–46.0)
Hemoglobin: 11.2 g/dL — ABNORMAL LOW (ref 12.0–15.0)
MCH: 29.4 pg (ref 26.0–34.0)
MCHC: 32.5 g/dL (ref 30.0–36.0)
MCV: 90.6 fL (ref 78.0–100.0)
Platelets: 219 10*3/uL (ref 150–400)
RBC: 3.81 MIL/uL — ABNORMAL LOW (ref 3.87–5.11)
RDW: 13.9 % (ref 11.5–15.5)
WBC: 4.4 10*3/uL (ref 4.0–10.5)

## 2012-05-06 LAB — GLUCOSE, CAPILLARY
Glucose-Capillary: 102 mg/dL — ABNORMAL HIGH (ref 70–99)
Glucose-Capillary: 117 mg/dL — ABNORMAL HIGH (ref 70–99)

## 2012-05-06 MED ORDER — CEFUROXIME AXETIL 250 MG PO TABS: 250.0000 mg | ORAL_TABLET | Freq: Two times a day (BID) | ORAL | Status: DC

## 2012-05-06 MED ORDER — NYSTATIN 100000 UNIT/ML MT SUSP: 500000.0000 [IU] | Freq: Four times a day (QID) | OROMUCOSAL | Status: DC

## 2012-05-06 NOTE — Progress Notes (Signed)
Pt provided with discharge instructions and prescriptions.  Pt provided with follow-up information.  Pt educated of medication to taken for the remainder of the day.  Pt demonstrated understanding by teach back.  Pt and family informed that [facility] in [place] is open until 2 p.m. Today.  Pt stable at time of d/c.  Pt transported by NT via wheelchair to main entrance for discharge.

## 2012-05-06 NOTE — Discharge Summary (Signed)
NAMESHALON, SALADO               ACCOUNT NO.:  [id number]  MEDICAL RECORD NO.:  [id number]  LOCATION:  A312                          FACILITY:  [facility]  PHYSICIAN:  Mila Homer. Sudie Bailey, M.D.DATE OF BIRTH:  [birth year]  DATE OF ADMISSION:  05/02/2012 DATE OF DISCHARGE:  11/28/2013LH                              DISCHARGE SUMMARY   This 71 year old was admitted to the hospital with severe fatigue which was felt to be secondary to pneumonia.  She had a fairly benign 5-day hospitalization extending from May 02, 2012, to May 06, 2012. Her vital signs remained stable.  Her admission white cell count was 8700, hemoglobin 11.5.  She had 78% neutrophils.  A recheck CBC showed a white cell count of 7000 with a hemoglobin 9.9, and then prior to discharge white cell count was 4400 with hemoglobin of 11.2.  Admission BMP was essentially normal and remained so.  She had sugars in the low 100 range.  Admission chest x-ray showed a subtle air space opacity in the right upper lung zone which was felt to be possibly due to pneumonia.  She had a 2-view chest repeated several days later, which again showed a subtle hazy opacification in the right upper lobe.  She was admitted to the hospital.  She was on IV fluids and she was continued on alprazolam and promethazine p.r.n., as well as Plavix, enalapril, Lovenox, fentanyl patch, iron polysaccharide, meclizine, metformin, and pantoprazole.  After it was determined that she really did very likely have pneumonia, she has put on ceftriaxone and azithromycin IV.  She gradually improved.  Her strength seemed to be improved.  Physical Therapy got her up and moving in the hall.  She did have fairly bad nausea the last couple nights of her hospitalization.  She felt part of this was because her reflux was worse on pantoprazole that had been on outpatient Dexilant.  She also was developing a yeast infection of her mouth, confirmed on the last  day. Her tongue at that time was reddened and fissured.  FINAL DISCHARGE DIAGNOSES: 1. Pneumonia. 2. Generalized malaise secondary to infection. 3. Reflux esophagitis. 4. Yeast glossitis. 5. Type 2 diabetes. 6. Helicobacter pylori infection. 7. Chronic pain.  She and I discussed the H pylori infection.  She had 3 days of triple- drug therapy for this which I stopped.  I stopped her Biaxin and amoxicillin because I did want her to be on 4 different antibiotics at once.  This will need to be resumed and re-treated once she is outpatient and her pneumonia is cleared.  She will continue her outpatient meds, which include alprazolam, calcium with vitamin D, Plavix, vitamin B12, cyclobenzaprine, docusate sodium, fluoxetine, enalapril, ferrous sulfate, latanoprost ophthalmic, metformin, digestive drops, Lyrica, silodosin, and new prescription of cefuroxime 500 mg b.i.d. for a 10-day course.  I am concerned about her persistent nausea so I am just treating her with 1 antibiotic, but also I am putting her on nystatin oral suspension 5 mL q.i.d. swish and swallow (120 mL with 1 refill).  Follow-up will be in the office within the week, or before then if needed.     Mila Homer. Sudie Bailey, M.D.  SDK/MEDQ  D:  05/06/2012  T:  05/06/2012  Job:  [id number]

## 2012-05-27 ENCOUNTER — Encounter (HOSPITAL_COMMUNITY): Payer: Self-pay | Admitting: *Deleted

## 2012-05-27 ENCOUNTER — Emergency Department (HOSPITAL_COMMUNITY): Payer: Medicare Other

## 2012-05-27 ENCOUNTER — Emergency Department (HOSPITAL_COMMUNITY)
Admission: EM | Admit: 2012-05-27 | Discharge: 2012-05-27 | Disposition: A | Discharge status: Home / Self Care | Payer: Medicare Other | Attending: Emergency Medicine | Admitting: Emergency Medicine

## 2012-05-27 DIAGNOSIS — Z87891 Personal history of nicotine dependence: Secondary | ICD-10-CM | POA: Insufficient documentation

## 2012-05-27 DIAGNOSIS — T148XXA Other injury of unspecified body region, initial encounter: Secondary | ICD-10-CM | POA: Insufficient documentation

## 2012-05-27 DIAGNOSIS — Z79899 Other long term (current) drug therapy: Secondary | ICD-10-CM | POA: Insufficient documentation

## 2012-05-27 DIAGNOSIS — Z8719 Personal history of other diseases of the digestive system: Secondary | ICD-10-CM | POA: Insufficient documentation

## 2012-05-27 DIAGNOSIS — W010XXA Fall on same level from slipping, tripping and stumbling without subsequent striking against object, initial encounter: Secondary | ICD-10-CM | POA: Insufficient documentation

## 2012-05-27 DIAGNOSIS — K219 Gastro-esophageal reflux disease without esophagitis: Secondary | ICD-10-CM | POA: Insufficient documentation

## 2012-05-27 DIAGNOSIS — Y9389 Activity, other specified: Secondary | ICD-10-CM | POA: Insufficient documentation

## 2012-05-27 DIAGNOSIS — Z8673 Personal history of transient ischemic attack (TIA), and cerebral infarction without residual deficits: Secondary | ICD-10-CM | POA: Insufficient documentation

## 2012-05-27 DIAGNOSIS — D649 Anemia, unspecified: Secondary | ICD-10-CM | POA: Insufficient documentation

## 2012-05-27 DIAGNOSIS — E119 Type 2 diabetes mellitus without complications: Secondary | ICD-10-CM | POA: Insufficient documentation

## 2012-05-27 DIAGNOSIS — W19XXXA Unspecified fall, initial encounter: Secondary | ICD-10-CM

## 2012-05-27 DIAGNOSIS — Y9289 Other specified places as the place of occurrence of the external cause: Secondary | ICD-10-CM | POA: Insufficient documentation

## 2012-05-27 HISTORY — DX: Fibromyalgia: M79.7

## 2012-05-27 LAB — BASIC METABOLIC PANEL
BUN: 9 mg/dL (ref 6–23)
CO2: 22 mEq/L (ref 19–32)
Calcium: 9 mg/dL (ref 8.4–10.5)
Chloride: 104 mEq/L (ref 96–112)
Creatinine, Ser: 0.8 mg/dL (ref 0.50–1.10)
GFR calc Af Amer: 85 mL/min — ABNORMAL LOW (ref >90)
GFR calc non Af Amer: 73 mL/min — ABNORMAL LOW (ref >90)
Glucose, Bld: 185 mg/dL — ABNORMAL HIGH (ref 70–99)
Potassium: 3.5 mEq/L (ref 3.5–5.1)
Sodium: 137 mEq/L (ref 135–145)

## 2012-05-27 LAB — URINALYSIS, ROUTINE W REFLEX MICROSCOPIC
Bilirubin Urine: NEGATIVE
Glucose, UA: NEGATIVE mg/dL
Hgb urine dipstick: NEGATIVE
Ketones, ur: NEGATIVE mg/dL
Leukocytes, UA: NEGATIVE
Nitrite: NEGATIVE
Protein, ur: NEGATIVE mg/dL
Specific Gravity, Urine: 1.01 (ref 1.005–1.030)
Urobilinogen, UA: 0.2 mg/dL (ref 0.0–1.0)
pH: 6 (ref 5.0–8.0)

## 2012-05-27 LAB — CBC
HCT: 31.8 % — ABNORMAL LOW (ref 36.0–46.0)
Hemoglobin: 10.4 g/dL — ABNORMAL LOW (ref 12.0–15.0)
MCH: 29.8 pg (ref 26.0–34.0)
MCHC: 32.7 g/dL (ref 30.0–36.0)
MCV: 91.1 fL (ref 78.0–100.0)
Platelets: 195 10*3/uL (ref 150–400)
RBC: 3.49 MIL/uL — ABNORMAL LOW (ref 3.87–5.11)
RDW: 14 % (ref 11.5–15.5)
WBC: 4.6 10*3/uL (ref 4.0–10.5)

## 2012-05-27 NOTE — ED Notes (Signed)
Family at bedside. 

## 2012-05-27 NOTE — ED Notes (Addendum)
Fell on Sunday down app 8 steps,  Larey Seat again 5am today.  Pt says she feels her speech is slurred.and numbness of tongue for 1 week.   Recent adm for  Pneumonia.  11/24     Alert, talking,   Takes plavix

## 2012-05-27 NOTE — ED Notes (Signed)
Patient is comfortable at this time. 

## 2012-05-27 NOTE — ED Provider Notes (Signed)
History     CSN: [id number]  Arrival date & time 05/27/12  1537   First MD Initiated Contact with Patient 05/27/12 1604      Chief Complaint  Patient presents with  . Fall    (Consider location/radiation/quality/duration/timing/severity/associated sxs/prior treatment) Patient is a 71 y.o. female presenting with fall. The history is provided by the patient.  Fall Pertinent negatives include no fever, no abdominal pain, no vomiting and no headaches.  pt c/o fall in bathroom this morning. States had gotten up at 5 am to go to bathroom, was feeling tired/drowsy, stumbled going to bathroom. Fell. Denies loc. Has a cane but ambulates on own. States had another fall a week ago at church, missed a step. ?hit head. Denies loc. Pt had c/o numb feeling to tongue and bil feet earlier, now just states tongue ( diffusely ) still slightly numb feeling. No one sided numbness/weakness. Denies change in speech or vision. No headache. No neck or back pain. Has been ambulatory since fall. Normal appetite. No nvd. Denies cp or sob. No abd pain. Denies gu c/o. Denies recent change in meds, states uses fentanyl patch for chronic neck and shoulder/arthritis pain.      Past Medical History  Diagnosis Date  . Nausea   . Bloating   . Dysphagia   . Gastroesophageal reflux   . Gastroparesis   . Anemia   . Diabetes mellitus   . TIA (transient ischemic attack)   . PONV (postoperative nausea and vomiting)   . Fibromyalgia     Past Surgical History  Procedure Date  . Hemicolectomy ZIEGLER  . Colonoscopy 10/11/2010  . Colonoscopy 11/23/2009  . Colonoscopy 07/28/2008    W/SNARE  . Colonoscopy 06/28/07  . Colonoscopy 05/10/07    W/POLYP  . Colonoscopy 12/28/00  . Upper gastrointestinal endoscopy 10/11/2010    EGD ED  . Upper gastrointestinal endoscopy 11/23/2009  . Upper gastrointestinal endoscopy 05/10/07    EGD ED  . Upper gastrointestinal endoscopy 08/11/01    EGD ED  . Cervical spine surgery    . Abdominal surgery     Family History  Problem Relation Age of Onset  . Heart disease Mother   . Diabetes Mother   . Dementia Father   . Healthy Sister   . Diabetes Brother   . Kidney cancer Brother   . Diabetes Brother   . Neuropathy Brother   . Diabetes Daughter   . Diabetes Daughter   . Diabetes Son     History  Substance Use Topics  . Smoking status: Former Smoker    Types: Cigarettes    Quit date: 03/17/1991  . Smokeless tobacco: Never Used     Comment: Patient states that it has ben greater than 20 years since she quit smoking  . Alcohol Use: No    OB History    Grav Para Term Preterm Abortions TAB SAB Ect Mult Living                  Review of Systems  Constitutional: Negative for fever and chills.  HENT: Negative for neck pain.   Eyes: Negative for visual disturbance.  Respiratory: Negative for cough and shortness of breath.   Cardiovascular: Negative for chest pain.  Gastrointestinal: Negative for vomiting, abdominal pain and diarrhea.  Genitourinary: Negative for dysuria and flank pain.  Musculoskeletal: Negative for back pain.  Skin: Negative for rash.  Neurological: Negative for headaches.  Hematological: Does not bruise/bleed easily.  Psychiatric/Behavioral: Negative for confusion.  Allergies  Codeine; Morphine; Ondansetron; Shellfish allergy; Aspirin; and Penicillins  Home Medications   Current Outpatient Rx  Name  Route  Sig  Dispense  Refill  . ALPRAZOLAM 1 MG PO TABS   Oral   Take 0.5 mg by mouth at bedtime as needed. Anxiety         . CALCIUM CARBONATE-VITAMIN D 250-125 MG-UNIT PO TABS   Oral   Take 1 tablet by mouth daily.           Marland Kitchen CEFUROXIME AXETIL 250 MG PO TABS   Oral   Take 1 tablet (250 mg total) by mouth 2 (two) times daily.   20 tablet   0   . CLOPIDOGREL BISULFATE 75 MG PO TABS   Oral   Take 75 mg by mouth daily.           Marland Kitchen VITAMIN B 12 PO   Oral   Take 2,000 mcg by mouth daily.         .  CYCLOBENZAPRINE HCL 10 MG PO TABS   Oral   Take 10 mg by mouth at bedtime. For muscle pain         . DEXLANSOPRAZOLE 60 MG PO CPDR   Oral   Take 60 mg by mouth daily.          Marland Kitchen DOCUSATE SODIUM 50 MG PO CAPS   Oral   Take 100 mg by mouth daily as needed.         . DULOXETINE HCL 60 MG PO CPEP   Oral   Take 60 mg by mouth daily.           . ENALAPRIL MALEATE 5 MG PO TABS   Oral   Take 5 mg by mouth daily.           . FENTANYL 50 MCG/HR TD PT72   Transdermal   Place 1 patch onto the skin every 3 (three) days.           Di Kindle SULFATE 325 (65 FE) MG PO TABS   Oral   Take 325 mg by mouth daily after supper.           Marland Kitchen LATANOPROST 0.005 % OP SOLN   Both Eyes   Place 1 drop into both eyes at bedtime.          Marland Kitchen METFORMIN HCL 1000 MG PO TABS   Oral   Take 1,000 mg by mouth 2 (two) times daily with a meal. Patient states she doesn't take at night if she doesn't need it         . MISC NATURAL PRODUCTS PO LIQD   Oral   Take 2 drops by mouth. Digestive Drops patient takes 2 drops every morning and 2 drops every night         . NYSTATIN 100000 UNIT/ML MT SUSP   Oral   Take 5 mLs (500,000 Units total) by mouth 4 (four) times daily.   120 mL   1   . PREGABALIN 100 MG PO CAPS   Oral   Take 100 mg by mouth daily.         Marland Kitchen SILODOSIN 8 MG PO CAPS   Oral   Take 8 mg by mouth daily with breakfast.           BP 108/62  Pulse 60  Temp 98.3 F (36.8 C) (Oral)  Resp 20  Ht 5\' 1"  (1.549 m)  Wt 138 lb (62.596 kg)  BMI 26.07 kg/m2  SpO2 100%  Physical Exam  Nursing note and vitals reviewed. Constitutional: She is oriented to person, place, and time. She appears well-developed and well-nourished. No distress.  HENT:  Head: Atraumatic.  Nose: Nose normal.  Mouth/Throat: Oropharynx is clear and moist.  Eyes: Conjunctivae normal are normal. Pupils are equal, round, and reactive to light. No scleral icterus.  Neck: Normal range of motion. Neck  supple. No tracheal deviation present.       No bruit  Cardiovascular: Normal rate, regular rhythm, normal heart sounds and intact distal pulses.   Pulmonary/Chest: Effort normal and breath sounds normal. No respiratory distress.  Abdominal: Soft. Normal appearance and bowel sounds are normal. She exhibits no distension. There is no tenderness.  Musculoskeletal: She exhibits no edema and no tenderness.       Good rom bil extremities without pain or focal bony tenderness. Minimal contusion/bruise to elbow and knee. No effusion. Distal pulses palp.  CTLS spine, non tender, aligned, no step off.   Neurological: She is alert and oriented to person, place, and time. No cranial nerve deficit.       Motor intact bil.   Skin: Skin is warm and dry. No rash noted. She is not diaphoretic.  Psychiatric: She has a normal mood and affect.    ED Course  Procedures (including critical care time)  Results for orders placed during the hospital encounter of 05/27/12  URINALYSIS, ROUTINE W REFLEX MICROSCOPIC      Component Value Range   Color, Urine YELLOW  YELLOW   APPearance CLEAR  CLEAR   Specific Gravity, Urine 1.010  1.005 - 1.030   pH 6.0  5.0 - 8.0   Glucose, UA NEGATIVE  NEGATIVE mg/dL   Hgb urine dipstick NEGATIVE  NEGATIVE   Bilirubin Urine NEGATIVE  NEGATIVE   Ketones, ur NEGATIVE  NEGATIVE mg/dL   Protein, ur NEGATIVE  NEGATIVE mg/dL   Urobilinogen, UA 0.2  0.0 - 1.0 mg/dL   Nitrite NEGATIVE  NEGATIVE   Leukocytes, UA NEGATIVE  NEGATIVE  CBC      Component Value Range   WBC 4.6  4.0 - 10.5 K/uL   RBC 3.49 (*) 3.87 - 5.11 MIL/uL   Hemoglobin 10.4 (*) 12.0 - 15.0 g/dL   HCT 47.8 (*) 29.5 - 62.1 %   MCV 91.1  78.0 - 100.0 fL   MCH 29.8  26.0 - 34.0 pg   MCHC 32.7  30.0 - 36.0 g/dL   RDW 30.8  65.7 - 84.6 %   Platelets 195  150 - 400 K/uL  BASIC METABOLIC PANEL      Component Value Range   Sodium 137  135 - 145 mEq/L   Potassium 3.5  3.5 - 5.1 mEq/L   Chloride 104  96 - 112 mEq/L    CO2 22  19 - 32 mEq/L   Glucose, Bld 185 (*) 70 - 99 mg/dL   BUN 9  6 - 23 mg/dL   Creatinine, Ser 9.62  0.50 - 1.10 mg/dL   Calcium 9.0  8.4 - 95.2 mg/dL   GFR calc non Af Amer 73 (*) >90 mL/min   GFR calc Af Amer 85 (*) >90 mL/min   Dg Chest 1 View  05/02/2012  *RADIOLOGY REPORT*  Clinical Data: Dizziness and weakness.  CHEST - 1 VIEW  Comparison: PA and lateral chest 03/11/2011.  Findings: Subsegmental atelectasis is seen in the left lung base. Subtle airspace opacity is seen in the right upper  lung zone. Heart size is normal.  No pneumothorax or pleural fluid.  IMPRESSION: Subtle airspace opacity right upper lung zone could be due to pneumonia.  PA and lateral chest films would be useful for further evaluation.   Original Report Authenticated By: Holley Dexter, M.D.    Dg Chest 2 View  05/03/2012  *RADIOLOGY REPORT*  Clinical Data: Rule out pneumonia.  CHEST - 2 VIEW  Comparison: 05/02/2012 and 03/11/2011  Findings: Lungs are adequately inflated with very subtle hazy density over the right upper lobe unchanged from 05/02/2012 and not well seen on the older exams.  There is no effusion. Cardiomediastinal silhouette and remainder of the exam is unchanged.  IMPRESSION: Subtle hazy opacification over the right upper lobe unchanged from 05/02/2012 and may be due to early infection.   Original Report Authenticated By: Elberta Fortis, M.D.    Ct Head Wo Contrast  05/27/2012  *RADIOLOGY REPORT*  Clinical Data: Pain, history diabetes, TIA, fibromyalgia  CT HEAD WITHOUT CONTRAST  Technique:  Contiguous axial images were obtained from the base of the skull through the vertex without contrast.  Comparison: 05/02/2012  Findings: Prior suboccipital craniotomy. Prior cervical spine fusion anteriorly at the lower cervical spine and posteriorly at the upper cervical spine. Question hearing aid left year. Generalized atrophy. Normal ventricular morphology. No midline shift or mass effect. Benign appearing  basal ganglia calcifications bilaterally. Mild small vessel chronic ischemic changes of deep cerebral white matter. No definite intracranial hemorrhage, mass lesion, or evidence of acute infarction. No extra-axial fluid collections. Bones and sinuses otherwise unremarkable.  IMPRESSION: Atrophy with minimal small vessel chronic ischemic changes of deep cerebral white matter. Postsurgical changes of a suboccipital craniotomy and cervical spine fusions. No acute cervical spine abnormalities.   Original Report Authenticated By: Ulyses Southward, M.D.    Ct Head Wo Contrast  05/02/2012  *RADIOLOGY REPORT*  Clinical Data: 71 year old female syncope weakness fall altered level of consciousness.  CT HEAD WITHOUT CONTRAST  Technique:  Contiguous axial images were obtained from the base of the skull through the vertex without contrast.  Comparison: Cervical spine radiographs 05/08/2011.  Head CT 03/11/2011.  Findings: Posterior cerclage wire partially visible in the upper cervical spine.  Previous suboccipital decompression changes are stable.  Mild paranasal sinus mucosal thickening, decreased from prior.  Mastoids are clear. No acute osseous abnormality identified.  Visualized orbits and scalp soft tissues are within normal limits.  Dystrophic basal ganglia calcifications are stable.  Cerebral volume is stable.  No ventriculomegaly. No midline shift, mass effect, or evidence of mass lesion.  No acute intracranial hemorrhage identified.  Scattered subcortical white matter hypodensity is not significantly changed. No evidence of cortically based acute infarction identified.  No suspicious intracranial vascular hyperdensity.  IMPRESSION: Stable postoperative and nonspecific subcortical white matter changes. No acute intracranial abnormality.   Original Report Authenticated By: Erskine Speed, M.D.       MDM  Labs. Ct. Ua.  Reviewed nursing notes and prior charts for additional history.   No faintness or dizziness ,no  loc.  On exam no focal bony tenderness, spine nt.   Pt appears stable for d/c. rec minimizing use of sedating meds, close pcp f/u incl for recent falls, mild anemia, and mild hyperglycemia.           Suzi Roots, MD 05/27/12 [phone]

## 2012-05-27 NOTE — ED Notes (Signed)
Patient ambulatory to restroom with steady gait. Urine specimen obtained. Patient refused in and out cath.

## 2012-05-27 NOTE — ED Notes (Signed)
Pt alert & oriented x4, stable gait. Patient given discharge instructions, paperwork & prescription(s). Patient  instructed to stop at the registration desk to finish any additional paperwork. Patient verbalized understanding. Pt left department w/ no further questions. 

## 2012-06-07 ENCOUNTER — Ambulatory Visit (INDEPENDENT_AMBULATORY_CARE_PROVIDER_SITE_OTHER): Payer: Medicare Other | Admitting: Internal Medicine

## 2012-07-20 ENCOUNTER — Encounter (INDEPENDENT_AMBULATORY_CARE_PROVIDER_SITE_OTHER): Payer: Self-pay | Admitting: Internal Medicine

## 2012-07-20 ENCOUNTER — Ambulatory Visit (INDEPENDENT_AMBULATORY_CARE_PROVIDER_SITE_OTHER): Payer: Medicare Other | Admitting: Internal Medicine

## 2012-07-20 VITALS — BP 118/70 | HR 76 | Temp 99.0°F | Resp 16 | Ht 61.0 in | Wt 142.2 lb

## 2012-07-20 DIAGNOSIS — K3184 Gastroparesis: Secondary | ICD-10-CM

## 2012-07-20 DIAGNOSIS — K219 Gastro-esophageal reflux disease without esophagitis: Secondary | ICD-10-CM

## 2012-07-20 MED ORDER — FAMOTIDINE 20 MG PO TABS: 40.0000 mg | ORAL_TABLET | Freq: Every day | ORAL | Status: DC

## 2012-07-20 MED ORDER — DEXLANSOPRAZOLE 60 MG PO CPDR: 60.0000 mg | DELAYED_RELEASE_CAPSULE | Freq: Every day | ORAL | Status: DC

## 2012-07-20 NOTE — Patient Instructions (Signed)
Take Pepcid or famotidine by mouth either before evening meal or at bedtime.

## 2012-07-20 NOTE — Progress Notes (Signed)
Presenting complaint;  Followup for GERD and gastroparesis.  Subjective:  Patient is 72 year old Caucasian female who is here for scheduled visit accompanied by her daughter. She states she was hospitalized over Thanksgiving for pneumonia. She did experience short period of vomiting secondary to antibiotic. She has gained 4 pounds since her last visit of June 2013. Her appetite is fair. She has heartburn couple of times a week but is never prolonged. She has intermittent dysphagia it is not progressive. She has not experienced episodes of food impaction. Bowels generally move daily. She may be constipated occasionally. She denies abdominal pain melena or rectal bleeding. She uses chewing gum in order to prevent her from gritting her teeth according to her daughter.  Current Medications: Current Outpatient Prescriptions  Medication Sig Dispense Refill  . ALPRAZolam (XANAX XR) 1 MG 24 hr tablet Take 0.5-1 mg by mouth daily as needed. For anxiety      . Calcium Carbonate-Vitamin D (CALCIUM + D PO) Take by mouth. Patient takes 1200 mg plus Vitamin D 1000 units  1 a day      . clopidogrel (PLAVIX) 75 MG tablet Take 75 mg by mouth daily.        . Cyanocobalamin (VITAMIN B 12 PO) Take 2,000 mcg by mouth daily.      . cyclobenzaprine (FLEXERIL) 10 MG tablet Take 10 mg by mouth at bedtime. For muscle pain      . dexlansoprazole (DEXILANT) 60 MG capsule Take 60 mg by mouth daily.       . ENALAPRIL MALEATE PO Take 5 mg by mouth daily.      . famotidine (PEPCID) 20 MG tablet Take 20 mg by mouth 2 (two) times daily.      . fentaNYL (DURAGESIC - DOSED MCG/HR) 50 MCG/HR Place 1 patch onto the skin every 3 (three) days.        . ferrous sulfate 325 (65 FE) MG tablet Take 325 mg by mouth daily after supper.        . metFORMIN (GLUCOPHAGE) 1000 MG tablet Take 1,000 mg by mouth 2 (two) times daily with a meal. Patient states she doesn't take at night if she doesn't need it      . MISC NATURAL PRODUCTS ER PO Take  by mouth. Digestive Blend Softgels - Patient takes 1-3 a day      . mometasone (NASONEX) 50 MCG/ACT nasal spray Place 2 sprays into the nose daily.      . polyethylene glycol powder (GLYCOLAX/MIRALAX) powder Take 17 g by mouth daily.      . silodosin (RAPAFLO) 8 MG CAPS capsule Take 8 mg by mouth daily with breakfast.      . topiramate (TOPAMAX) 100 MG tablet Take 100 mg by mouth at bedtime.      . Travoprost, BAK Free, (TRAVATAN) 0.004 % SOLN ophthalmic solution Place 1 drop into both eyes at bedtime.      Marland Kitchen albuterol (PROVENTIL HFA) 108 (90 BASE) MCG/ACT inhaler Inhale 2 puffs into the lungs every 6 (six) hours as needed.      . cefUROXime (CEFTIN) 250 MG tablet Take 1 tablet (250 mg total) by mouth 2 (two) times daily.  20 tablet  0   No current facility-administered medications for this visit.     Objective: Blood pressure 118/70, pulse 76, temperature 99 F (37.2 C), temperature source Oral, resp. rate 16, height 5\' 1"  (1.549 m), weight 142 lb 3.2 oz (64.501 kg). Patient is alert and in no acute  distress. Conjunctiva is pink. Sclera is nonicteric Oropharyngeal mucosa is normal. No neck masses or thyromegaly noted. Abdomen is full and soft with mild midepigastric tenderness. No organomegaly or masses. No succussion splash noted over epigastric region. No LE edema or clubbing noted.    Assessment:  #1. Diabetic gastroparesis. She is on herbal preparation and appears to be doing well. #2. 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 She is on PPI and H2B. 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 Last  colonoscopy was in June 2011([facility]).    Plan:  New prescription given for famotidine 40 mg by mouth daily. Office visit in 6 months. Will consider surveillance colonoscopy following her next visit.

## 2012-12-15 ENCOUNTER — Emergency Department (HOSPITAL_COMMUNITY)
Admission: EM | Admit: 2012-12-15 | Discharge: 2012-12-15 | Disposition: A | Discharge status: Home / Self Care | Payer: No Typology Code available for payment source | Attending: Emergency Medicine | Admitting: Emergency Medicine

## 2012-12-15 ENCOUNTER — Encounter (HOSPITAL_COMMUNITY): Payer: Self-pay | Admitting: *Deleted

## 2012-12-15 ENCOUNTER — Emergency Department (HOSPITAL_COMMUNITY): Payer: No Typology Code available for payment source

## 2012-12-15 DIAGNOSIS — M542 Cervicalgia: Secondary | ICD-10-CM

## 2012-12-15 DIAGNOSIS — S0993XA Unspecified injury of face, initial encounter: Secondary | ICD-10-CM | POA: Insufficient documentation

## 2012-12-15 DIAGNOSIS — S4980XA Other specified injuries of shoulder and upper arm, unspecified arm, initial encounter: Secondary | ICD-10-CM | POA: Insufficient documentation

## 2012-12-15 DIAGNOSIS — K219 Gastro-esophageal reflux disease without esophagitis: Secondary | ICD-10-CM | POA: Insufficient documentation

## 2012-12-15 DIAGNOSIS — Z8709 Personal history of other diseases of the respiratory system: Secondary | ICD-10-CM | POA: Insufficient documentation

## 2012-12-15 DIAGNOSIS — Z880 Allergy status to penicillin: Secondary | ICD-10-CM | POA: Insufficient documentation

## 2012-12-15 DIAGNOSIS — K3184 Gastroparesis: Secondary | ICD-10-CM | POA: Insufficient documentation

## 2012-12-15 DIAGNOSIS — S46909A Unspecified injury of unspecified muscle, fascia and tendon at shoulder and upper arm level, unspecified arm, initial encounter: Secondary | ICD-10-CM | POA: Insufficient documentation

## 2012-12-15 DIAGNOSIS — D649 Anemia, unspecified: Secondary | ICD-10-CM | POA: Insufficient documentation

## 2012-12-15 DIAGNOSIS — E119 Type 2 diabetes mellitus without complications: Secondary | ICD-10-CM | POA: Insufficient documentation

## 2012-12-15 DIAGNOSIS — Y92410 Unspecified street and highway as the place of occurrence of the external cause: Secondary | ICD-10-CM | POA: Insufficient documentation

## 2012-12-15 DIAGNOSIS — Z79899 Other long term (current) drug therapy: Secondary | ICD-10-CM | POA: Insufficient documentation

## 2012-12-15 DIAGNOSIS — Z8673 Personal history of transient ischemic attack (TIA), and cerebral infarction without residual deficits: Secondary | ICD-10-CM | POA: Insufficient documentation

## 2012-12-15 DIAGNOSIS — Y9389 Activity, other specified: Secondary | ICD-10-CM | POA: Insufficient documentation

## 2012-12-15 DIAGNOSIS — S0990XA Unspecified injury of head, initial encounter: Secondary | ICD-10-CM | POA: Insufficient documentation

## 2012-12-15 DIAGNOSIS — IMO0001 Reserved for inherently not codable concepts without codable children: Secondary | ICD-10-CM | POA: Insufficient documentation

## 2012-12-15 MED ORDER — CYCLOBENZAPRINE HCL 10 MG PO TABS: 10.0000 mg | ORAL_TABLET | Freq: Two times a day (BID) | ORAL | Status: DC | PRN
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
## 2013-01-18 ENCOUNTER — Encounter (INDEPENDENT_AMBULATORY_CARE_PROVIDER_SITE_OTHER): Payer: Self-pay | Admitting: Internal Medicine

## 2013-01-18 ENCOUNTER — Ambulatory Visit (INDEPENDENT_AMBULATORY_CARE_PROVIDER_SITE_OTHER): Payer: Medicare Other | Admitting: Internal Medicine

## 2013-01-18 ENCOUNTER — Telehealth (INDEPENDENT_AMBULATORY_CARE_PROVIDER_SITE_OTHER): Payer: Self-pay | Admitting: *Deleted

## 2013-01-18 VITALS — BP 102/66 | HR 74 | Temp 98.5°F | Resp 16 | Ht 61.0 in | Wt 130.5 lb

## 2013-01-18 DIAGNOSIS — R634 Abnormal weight loss: Secondary | ICD-10-CM

## 2013-01-18 DIAGNOSIS — R110 Nausea: Secondary | ICD-10-CM

## 2013-01-18 DIAGNOSIS — K219 Gastro-esophageal reflux disease without esophagitis: Secondary | ICD-10-CM

## 2013-01-18 DIAGNOSIS — K3184 Gastroparesis: Secondary | ICD-10-CM

## 2013-01-18 LAB — CBC
HCT: 35.4 % — ABNORMAL LOW (ref 36.0–46.0)
Hemoglobin: 12 g/dL (ref 12.0–15.0)
MCH: 29.9 pg (ref 26.0–34.0)
MCHC: 33.9 g/dL (ref 30.0–36.0)
MCV: 88.3 fL (ref 78.0–100.0)
Platelets: 218 10*3/uL (ref 150–400)
RBC: 4.01 MIL/uL (ref 3.87–5.11)
RDW: 13.9 % (ref 11.5–15.5)
WBC: 6.5 10*3/uL (ref 4.0–10.5)

## 2013-01-18 LAB — COMPREHENSIVE METABOLIC PANEL
ALT: 11 U/L (ref 0–35)
AST: 12 U/L (ref 0–37)
Albumin: 4.1 g/dL (ref 3.5–5.2)
Alkaline Phosphatase: 38 U/L — ABNORMAL LOW (ref 39–117)
BUN: 9 mg/dL (ref 6–23)
CO2: 30 mEq/L (ref 19–32)
Calcium: 10 mg/dL (ref 8.4–10.5)
Chloride: 103 mEq/L (ref 96–112)
Creat: 0.92 mg/dL (ref 0.50–1.10)
Glucose, Bld: 88 mg/dL (ref 70–99)
Potassium: 3.8 mEq/L (ref 3.5–5.3)
Sodium: 139 mEq/L (ref 135–145)
Total Bilirubin: 0.2 mg/dL — ABNORMAL LOW (ref 0.3–1.2)
Total Protein: 6.4 g/dL (ref 6.0–8.3)

## 2013-01-18 MED ORDER — PROMETHAZINE HCL 25 MG PO TABS: 12.5000 mg | ORAL_TABLET | Freq: Two times a day (BID) | ORAL | Status: DC | PRN

## 2013-01-18 NOTE — Telephone Encounter (Signed)
Dr.Rehman, Patient failed to ask for something for Nausea at the time of her office visit earlier today.She would like for you to call her in something.
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
## 2013-01-18 NOTE — Patient Instructions (Signed)
Physician will contact you with the results of blood work Domperidone 10 mg by mouth 30 minutes before each meal. Call with progress report after you've been on domperidone for one week.

## 2013-01-19 LAB — TSH: TSH: 1.801 u[IU]/mL (ref 0.350–4.500)

## 2013-01-19 NOTE — Telephone Encounter (Signed)
Prescription for Phenergan sent to her pharmacy yesterday.

## 2013-01-31 ENCOUNTER — Telehealth (INDEPENDENT_AMBULATORY_CARE_PROVIDER_SITE_OTHER): Payer: Self-pay | Admitting: *Deleted

## 2013-01-31 ENCOUNTER — Other Ambulatory Visit (INDEPENDENT_AMBULATORY_CARE_PROVIDER_SITE_OTHER): Payer: Self-pay | Admitting: Internal Medicine

## 2013-01-31 DIAGNOSIS — R12 Heartburn: Secondary | ICD-10-CM

## 2013-01-31 DIAGNOSIS — K3184 Gastroparesis: Secondary | ICD-10-CM

## 2013-01-31 DIAGNOSIS — R112 Nausea with vomiting, unspecified: Secondary | ICD-10-CM

## 2013-01-31 NOTE — Telephone Encounter (Signed)
[name] was to let Dr. Karilyn Cota know when she received her shipment of Domperidone. It was received on Saturday, 01/29/13. [name] took 2 pills that day and has been on them every day. She also said Dr. Karilyn Cota was going to order an X-ray after one week of being on the medicine. Her return phone number is [phone].

## 2013-01-31 NOTE — Telephone Encounter (Signed)
Patient started on 01/29/13, per Dr.Rehman 1 week after she is to have Gastric Emptying Study. Patient was instructed that she is to take 1 Domperidone 10 mg by mouth prior to study. Patient was also advised that she is to take this medication by mouth 3 times a day, and she states that she is . [name] was advised that [name] would call her back with date, time and instruction.

## 2013-01-31 NOTE — Telephone Encounter (Signed)
GES sch'd 02/08/13 at [phone]), npo after midnight, patient aware

## 2013-02-08 ENCOUNTER — Encounter (HOSPITAL_COMMUNITY): Payer: Self-pay

## 2013-02-08 ENCOUNTER — Other Ambulatory Visit (HOSPITAL_COMMUNITY): Payer: Self-pay | Admitting: Family Medicine

## 2013-02-08 ENCOUNTER — Encounter (HOSPITAL_COMMUNITY)
Admission: RE | Admit: 2013-02-08 | Discharge: 2013-02-08 | Disposition: A | Discharge status: Home / Self Care | Payer: Medicare Other | Source: Ambulatory Visit | Attending: Internal Medicine | Admitting: Internal Medicine

## 2013-02-08 DIAGNOSIS — K3184 Gastroparesis: Secondary | ICD-10-CM

## 2013-02-08 DIAGNOSIS — R112 Nausea with vomiting, unspecified: Secondary | ICD-10-CM | POA: Insufficient documentation

## 2013-02-08 DIAGNOSIS — Z139 Encounter for screening, unspecified: Secondary | ICD-10-CM

## 2013-02-08 DIAGNOSIS — R12 Heartburn: Secondary | ICD-10-CM | POA: Insufficient documentation

## 2013-02-08 HISTORY — DX: Essential (primary) hypertension: I10

## 2013-02-08 HISTORY — DX: Disorder of kidney and ureter, unspecified: N28.9

## 2013-02-08 MED ORDER — TECHNETIUM TC 99M SULFUR COLLOID
2.0000 | Freq: Once | INTRAVENOUS | Status: AC | PRN
  Administered 2013-02-08: 2 via INTRAVENOUS

## 2013-02-11 ENCOUNTER — Ambulatory Visit (HOSPITAL_COMMUNITY)
Admission: RE | Admit: 2013-02-11 | Discharge: 2013-02-11 | Disposition: A | Discharge status: Home / Self Care | Payer: Medicare Other | Source: Ambulatory Visit | Attending: Family Medicine | Admitting: Family Medicine

## 2013-02-11 DIAGNOSIS — Z1231 Encounter for screening mammogram for malignant neoplasm of breast: Secondary | ICD-10-CM | POA: Insufficient documentation

## 2013-02-11 DIAGNOSIS — Z139 Encounter for screening, unspecified: Secondary | ICD-10-CM

## 2013-02-22 ENCOUNTER — Encounter (INDEPENDENT_AMBULATORY_CARE_PROVIDER_SITE_OTHER): Payer: Self-pay | Admitting: Internal Medicine

## 2013-02-22 ENCOUNTER — Ambulatory Visit (INDEPENDENT_AMBULATORY_CARE_PROVIDER_SITE_OTHER): Payer: Medicare Other | Admitting: Internal Medicine

## 2013-02-22 VITALS — BP 102/66 | HR 72 | Temp 99.0°F | Resp 16 | Ht 61.0 in | Wt 129.4 lb

## 2013-02-22 DIAGNOSIS — R634 Abnormal weight loss: Secondary | ICD-10-CM

## 2013-02-22 DIAGNOSIS — K3184 Gastroparesis: Secondary | ICD-10-CM

## 2013-02-22 NOTE — Patient Instructions (Addendum)
Domperidone 10 to 20 mg before each meal daily. Call If you think you are having any side effects.

## 2013-02-22 NOTE — Progress Notes (Signed)
Presenting complaint;  Followup for diabetic gastroparesis.  Subjective:  Patient is 72 year old Caucasian female who has diabetic gastroparesis. Her symptoms have not been well controlled with dietary measures and a PPI and H2B. [place] She had solid phase gastric emptying study following 10 mg of domperidone by mouth. Gastric emptying study was still abnormal. 72% of the tracer was still in the stomach at 2 hours. Patient was advised to take domperidone 20 mg before breakfast and evening meal and 10 mg before lunch. She is feeling at least 50% better. She is having much less nausea and able to eat. Her appetite is still not normal. She has not experienced vomiting spells since she has been on domperidone. She is not having any side effects with that medication. She wants to try 20 mg before lunch as well. She is having problems with constipation. She is not taking medication on a regular basis. She complains of pain involving left shoulder left side of the neck and head. She states pain patch is not helping. She has an appointment with neurosurgeon next week.   Current Medications: Current Outpatient Prescriptions  Medication Sig Dispense Refill  . albuterol (PROVENTIL HFA) 108 (90 BASE) MCG/ACT inhaler Inhale 2 puffs into the lungs every 6 (six) hours as needed for shortness of breath.       . ALPRAZolam (XANAX XR) 1 MG 24 hr tablet Take 0.5-1 mg by mouth daily as needed (Anxiety).       . Calcium Carbonate-Vitamin D (CALCIUM + D PO) Take 1 tablet by mouth daily.       . clopidogrel (PLAVIX) 75 MG tablet Take 75 mg by mouth daily.        . Cyanocobalamin (VITAMIN B 12 PO) Take 2,000 mcg by mouth daily.      Marland Kitchen dexlansoprazole (DEXILANT) 60 MG capsule Take 1 capsule (60 mg total) by mouth daily.  90 capsule  3  . docusate sodium (COLACE) 100 MG capsule Take 100 mg by mouth as needed for constipation.      . enalapril (VASOTEC) 5 MG tablet Take 5 mg  by mouth daily.      . famotidine (PEPCID) 20 MG tablet Take 2 tablets (40 mg total) by mouth at bedtime.  90 tablet  3  . fenofibrate micronized (LOFIBRA) 134 MG capsule Take 134 mg by mouth daily before breakfast.      . fentaNYL (DURAGESIC - DOSED MCG/HR) 75 MCG/HR Place 1 patch onto the skin every 3 (three) days.      . metFORMIN (GLUCOPHAGE) 1000 MG tablet Take 1,000 mg by mouth daily.       Marland Kitchen MISC NATURAL PRODUCTS ER PO Take 1-3 capsules by mouth daily. Digestive Blend Softgels      . mometasone (NASONEX) 50 MCG/ACT nasal spray Place 2 sprays into the nose daily as needed (Allergies).       . Pediatric Multivitamins-Iron (FLINTSTONES PLUS IRON PO) Take by mouth daily.      . polyethylene glycol powder (GLYCOLAX/MIRALAX) powder Take 17 g by mouth daily.      . promethazine (PHENERGAN) 25 MG tablet Take 0.5 tablets (12.5 mg total) by mouth 2 (two) times daily as needed for nausea.  30 tablet  1  . silodosin (RAPAFLO) 8 MG CAPS capsule Take 8 mg by mouth daily with breakfast.      . temazepam (RESTORIL) 30 MG capsule Take 30 mg by mouth at bedtime as needed for sleep.      Marland Kitchen  topiramate (TOPAMAX) 100 MG tablet Take 100 mg by mouth at bedtime.      . Travoprost, BAK Free, (TRAVATAN) 0.004 % SOLN ophthalmic solution Place 1 drop into both eyes at bedtime.       No current facility-administered medications for this visit.     Objective: Blood pressure 102/66, pulse 72, temperature 99 F (37.2 C), temperature source Oral, resp. rate 16, height 5\' 1"  (1.549 m), weight 129 lb 6.4 oz (58.695 kg).  patient is alert and in no acute distress. Conjunctiva is pink. Sclera is nonicteric Oropharyngeal mucosa is normal. No neck masses or thyromegaly noted. Abdomen is flat soft with mild midepigastric tenderness. No organomegaly or masses.  No LE edema or clubbing noted.    Assessment:  #1. Diabetic gastroparesis possibly made worse with narcotic therapy. Symptoms were not well controlled with  acid suppression and dietary measures. She is doing much better with domperidone and having no side effects. #2. history of CRC. She is due for surveillance colonoscopy which will be delayed until evaluation for shoulder neck pain and headache completed    Plan:  Continue domperidone at 10-20 mg by mouth 3 times a day. Office visit in 4 months.

## 2013-03-02 ENCOUNTER — Ambulatory Visit (HOSPITAL_COMMUNITY)
Admission: RE | Admit: 2013-03-02 | Discharge: 2013-03-02 | Disposition: A | Discharge status: Home / Self Care | Payer: Medicare Other | Source: Ambulatory Visit | Attending: Neurosurgery | Admitting: Neurosurgery

## 2013-03-02 ENCOUNTER — Other Ambulatory Visit (HOSPITAL_COMMUNITY): Payer: Self-pay | Admitting: Neurosurgery

## 2013-03-02 DIAGNOSIS — M47812 Spondylosis without myelopathy or radiculopathy, cervical region: Secondary | ICD-10-CM

## 2013-03-02 DIAGNOSIS — M542 Cervicalgia: Secondary | ICD-10-CM | POA: Insufficient documentation

## 2013-03-02 DIAGNOSIS — R209 Unspecified disturbances of skin sensation: Secondary | ICD-10-CM | POA: Insufficient documentation

## 2013-03-02 DIAGNOSIS — R519 Headache, unspecified: Secondary | ICD-10-CM

## 2013-03-22 ENCOUNTER — Ambulatory Visit (INDEPENDENT_AMBULATORY_CARE_PROVIDER_SITE_OTHER): Payer: Medicare Other | Admitting: Neurology

## 2013-03-22 ENCOUNTER — Encounter: Payer: Self-pay | Admitting: Neurology

## 2013-03-22 VITALS — BP 94/51 | HR 46 | Ht 60.0 in | Wt 131.0 lb

## 2013-03-22 DIAGNOSIS — G243 Spasmodic torticollis: Secondary | ICD-10-CM

## 2013-03-22 DIAGNOSIS — M5090 Cervical disc disorder, unspecified, unspecified cervical region: Secondary | ICD-10-CM

## 2013-03-22 DIAGNOSIS — R51 Headache: Secondary | ICD-10-CM

## 2013-03-22 MED ORDER — BACLOFEN 10 MG PO TABS: 10.0000 mg | ORAL_TABLET | Freq: Every day | ORAL | Status: DC
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
## 2013-03-22 NOTE — Patient Instructions (Signed)
Overall you are doing fairly well but I do want to suggest a few things today:   Remember to drink plenty of fluid, eat healthy meals and do not skip any meals. Try to eat protein with a every meal and eat a healthy snack such as fruit or nuts in between meals. Try to keep a regular sleep-wake schedule and try to exercise daily, particularly in the form of walking, 20-30 minutes a day, if you can.   As far as your medications are concerned, I would like to suggest trying a medication called Baclofen. You will take 10mg  nightly.   I placed a referral to physical therapy to help stretch and relax your neck muscles.   My clinical assistant and will answer any of your questions and relay your messages to me and also relay most of my messages to you.   Our phone number is [phone]. We also have an after hours call service for urgent matters and there is a physician on-call for urgent questions. For any emergencies you know to call 911 or go to the nearest emergency room

## 2013-03-24 ENCOUNTER — Encounter (HOSPITAL_COMMUNITY): Payer: Self-pay | Admitting: Emergency Medicine

## 2013-03-24 ENCOUNTER — Emergency Department (HOSPITAL_COMMUNITY)
Admission: EM | Admit: 2013-03-24 | Discharge: 2013-03-24 | Disposition: A | Discharge status: Home / Self Care | Payer: Medicare Other | Attending: Emergency Medicine | Admitting: Emergency Medicine

## 2013-03-24 ENCOUNTER — Telehealth (INDEPENDENT_AMBULATORY_CARE_PROVIDER_SITE_OTHER): Payer: Self-pay | Admitting: *Deleted

## 2013-03-24 ENCOUNTER — Emergency Department (HOSPITAL_COMMUNITY): Payer: Medicare Other

## 2013-03-24 DIAGNOSIS — K219 Gastro-esophageal reflux disease without esophagitis: Secondary | ICD-10-CM | POA: Insufficient documentation

## 2013-03-24 DIAGNOSIS — Z79899 Other long term (current) drug therapy: Secondary | ICD-10-CM | POA: Insufficient documentation

## 2013-03-24 DIAGNOSIS — R112 Nausea with vomiting, unspecified: Secondary | ICD-10-CM | POA: Insufficient documentation

## 2013-03-24 DIAGNOSIS — K3184 Gastroparesis: Secondary | ICD-10-CM | POA: Insufficient documentation

## 2013-03-24 DIAGNOSIS — IMO0002 Reserved for concepts with insufficient information to code with codable children: Secondary | ICD-10-CM | POA: Insufficient documentation

## 2013-03-24 DIAGNOSIS — Z859 Personal history of malignant neoplasm, unspecified: Secondary | ICD-10-CM | POA: Insufficient documentation

## 2013-03-24 DIAGNOSIS — IMO0001 Reserved for inherently not codable concepts without codable children: Secondary | ICD-10-CM | POA: Insufficient documentation

## 2013-03-24 DIAGNOSIS — I498 Other specified cardiac arrhythmias: Secondary | ICD-10-CM | POA: Insufficient documentation

## 2013-03-24 DIAGNOSIS — R0789 Other chest pain: Secondary | ICD-10-CM | POA: Insufficient documentation

## 2013-03-24 DIAGNOSIS — I10 Essential (primary) hypertension: Secondary | ICD-10-CM | POA: Insufficient documentation

## 2013-03-24 DIAGNOSIS — E119 Type 2 diabetes mellitus without complications: Secondary | ICD-10-CM | POA: Insufficient documentation

## 2013-03-24 DIAGNOSIS — R001 Bradycardia, unspecified: Secondary | ICD-10-CM

## 2013-03-24 DIAGNOSIS — Z7902 Long term (current) use of antithrombotics/antiplatelets: Secondary | ICD-10-CM | POA: Insufficient documentation

## 2013-03-24 DIAGNOSIS — Z8673 Personal history of transient ischemic attack (TIA), and cerebral infarction without residual deficits: Secondary | ICD-10-CM | POA: Insufficient documentation

## 2013-03-24 DIAGNOSIS — Z862 Personal history of diseases of the blood and blood-forming organs and certain disorders involving the immune mechanism: Secondary | ICD-10-CM | POA: Insufficient documentation

## 2013-03-24 DIAGNOSIS — R42 Dizziness and giddiness: Secondary | ICD-10-CM | POA: Insufficient documentation

## 2013-03-24 DIAGNOSIS — Z880 Allergy status to penicillin: Secondary | ICD-10-CM | POA: Insufficient documentation

## 2013-03-24 DIAGNOSIS — Z8701 Personal history of pneumonia (recurrent): Secondary | ICD-10-CM | POA: Insufficient documentation

## 2013-03-24 DIAGNOSIS — R509 Fever, unspecified: Secondary | ICD-10-CM | POA: Insufficient documentation

## 2013-03-24 LAB — CBC WITH DIFFERENTIAL/PLATELET
Basophils Absolute: 0 10*3/uL (ref 0.0–0.1)
Basophils Relative: 0 % (ref 0–1)
Eosinophils Absolute: 0.1 10*3/uL (ref 0.0–0.7)
Eosinophils Relative: 2 % (ref 0–5)
HCT: 36.4 % (ref 36.0–46.0)
Hemoglobin: 11.8 g/dL — ABNORMAL LOW (ref 12.0–15.0)
Lymphocytes Relative: 41 % (ref 12–46)
Lymphs Abs: 2.2 10*3/uL (ref 0.7–4.0)
MCH: 29.6 pg (ref 26.0–34.0)
MCHC: 32.4 g/dL (ref 30.0–36.0)
MCV: 91.5 fL (ref 78.0–100.0)
Monocytes Absolute: 0.3 10*3/uL (ref 0.1–1.0)
Monocytes Relative: 5 % (ref 3–12)
Neutro Abs: 2.9 10*3/uL (ref 1.7–7.7)
Neutrophils Relative %: 53 % (ref 43–77)
Platelets: 232 10*3/uL (ref 150–400)
RBC: 3.98 MIL/uL (ref 3.87–5.11)
RDW: 13.1 % (ref 11.5–15.5)
WBC: 5.5 10*3/uL (ref 4.0–10.5)

## 2013-03-24 LAB — COMPREHENSIVE METABOLIC PANEL
ALT: 10 U/L (ref 0–35)
AST: 14 U/L (ref 0–37)
Albumin: 4.2 g/dL (ref 3.5–5.2)
Alkaline Phosphatase: 29 U/L — ABNORMAL LOW (ref 39–117)
BUN: 6 mg/dL (ref 6–23)
CO2: 25 mEq/L (ref 19–32)
Calcium: 9.9 mg/dL (ref 8.4–10.5)
Chloride: 100 mEq/L (ref 96–112)
Creatinine, Ser: 1.16 mg/dL — ABNORMAL HIGH (ref 0.50–1.10)
GFR calc Af Amer: 54 mL/min — ABNORMAL LOW (ref >90)
GFR calc non Af Amer: 46 mL/min — ABNORMAL LOW (ref >90)
Glucose, Bld: 104 mg/dL — ABNORMAL HIGH (ref 70–99)
Potassium: 3.8 mEq/L (ref 3.5–5.1)
Sodium: 135 mEq/L (ref 135–145)
Total Bilirubin: 0.2 mg/dL — ABNORMAL LOW (ref 0.3–1.2)
Total Protein: 7.4 g/dL (ref 6.0–8.3)

## 2013-03-24 LAB — LIPASE, BLOOD: Lipase: 33 U/L (ref 11–59)

## 2013-03-24 LAB — GLUCOSE, CAPILLARY: Glucose-Capillary: 84 mg/dL (ref 70–99)

## 2013-03-24 LAB — TROPONIN I: Troponin I: <0.3 ng/mL (ref <0.30)

## 2013-03-24 LAB — MAGNESIUM: Magnesium: 1.8 mg/dL (ref 1.5–2.5)

## 2013-03-24 MED ORDER — FAMOTIDINE IN NACL 20-0.9 MG/50ML-% IV SOLN
20.0000 mg | Freq: Once | INTRAVENOUS | Status: AC
  Administered 2013-03-24: 20 mg via INTRAVENOUS
  Filled 2013-03-24: qty 50

## 2013-03-24 MED ORDER — NITROGLYCERIN 2 % TD OINT
1.0000 [in_us] | TOPICAL_OINTMENT | Freq: Four times a day (QID) | TRANSDERMAL | Status: DC
  Administered 2013-03-24: 1 [in_us] via TOPICAL
  Filled 2013-03-24: qty 1

## 2013-03-24 MED ORDER — PROMETHAZINE HCL 12.5 MG PO TABS: 12.5000 mg | ORAL_TABLET | Freq: Three times a day (TID) | ORAL | Status: DC | PRN

## 2013-03-24 MED ORDER — GI COCKTAIL ~~LOC~~
30.0000 mL | Freq: Once | ORAL | Status: AC
  Administered 2013-03-24: 30 mL via ORAL
  Filled 2013-03-24: qty 30

## 2013-03-24 NOTE — ED Notes (Signed)
CBG of 84 

## 2013-03-24 NOTE — ED Notes (Addendum)
Patient has been having intermittent daily central chest pain radiating to L jaw on occasion.  Associated nausea, vomiting, SOB, belching, lightheadedness, but no diaphoresis.  Describes as aching, pressure, burning, sometimes sharp stabbing.  Takes Dexelant rx from Dr. Minus Liberty.

## 2013-03-24 NOTE — ED Notes (Signed)
Fentanyl patch to right shoulder blade area, pt states it was applied on Wednesday.

## 2013-03-24 NOTE — ED Notes (Signed)
Pt. Ambulated to bathroom stating "usual" diziness. PO2 level 98-100%.

## 2013-03-24 NOTE — Telephone Encounter (Signed)
Patient was called and advised to go to the ED for evaluation. She states that her daughter will take her to [facility]/ED.

## 2013-03-24 NOTE — ED Provider Notes (Signed)
CSN: [id number]     Arrival date & time 03/24/13  1046 History   This chart was scribed for Ward Givens, MD, by Yevette Edwards, ED Scribe. This patient was seen in room APA07/[facility]07 and the patient's care was started at 11:36 AM.  First MD Initiated Contact with Patient 03/24/13 1104     Chief Complaint  Patient presents with  . Chest Pain    The history is provided by the patient. No language interpreter was used.   HPI Comments: [name] is a 72 y.o. female who presents to the Emergency Department complaining of chest pain which has been occurring intermittently for the past three weeks, but which increased in severity yesterday and has remained constant since last night. She rates the current pain as 6/6, but she reports the chest pain was worst yesterday evening although she can't give it a number. She also reports that the previous episodes of chest pain she had experienced in the past few weeks would last several hours before spontaneously resolving. The pt states the pain is located to her midsternum and radiates towards her left breast. She characterizes the pain as "aching and sharp." She denies any increase to the pain with exertion, ambulation, coughing, or deep inspiration. The pt has experienced associated emesis and SOB with the pain last night and vomited 2-3 times. She denies a cough. The pt had a subjective fever yesterday, and she has experienced dizziness and pain to her left arm. She also reports that she feels like she has a rash to her mouth. She denies a h/o similar chest pain, cardiac issues, stents, atrial fibrulation. She reports that her mother had CHF and died of a heart attack when she was 47 years old. Her son had surgery to his aorta when he was 72 years old. She states that she has both maternal and paternal aunts and uncles who have had cardiac disease.   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 The pt takes plavix, and she has not had any recent issues with TIA. The pt also has a h/o pneumonia. She also has a h/o abdominal surgery including cholecystectomy and hiatal hernia. She reports that with the hiatal hernia, she had both part of her stomach and esophagus removed.  She states she is allergic to aspirin.   She is a former smoker, though she states she has not smoked for years. She lives in her home independently. She ambulates with a cane.   PCP Dr Sudie Bailey  Past Medical History  Diagnosis Date  . Nausea   . Bloating   . Dysphagia   . Gastroesophageal reflux   . Gastroparesis   . Anemia   . Diabetes mellitus   . TIA (transient ischemic attack)   . PONV (postoperative nausea and vomiting)   . Fibromyalgia   . Cancer   . Hypertension   . Renal insufficiency    Past Surgical History  Procedure Laterality Date  . Hemicolectomy  ZIEGLER  . Colonoscopy  10/11/2010  . Colonoscopy  11/23/2009  . Colonoscopy  07/28/2008    W/SNARE  . Colonoscopy  06/28/07  . Colonoscopy  05/10/07    W/POLYP  . Colonoscopy  12/28/00  . Upper gastrointestinal endoscopy  10/11/2010    EGD ED  . Upper gastrointestinal endoscopy  11/23/2009  . Upper gastrointestinal endoscopy  05/10/07    EGD ED  .  Upper gastrointestinal endoscopy  08/11/01    EGD ED  . Cervical spine surgery    . Abdominal surgery     Family History  Problem Relation Age of Onset  . Heart disease Mother   . Diabetes Mother   . Dementia Father   . Healthy Sister   . Diabetes Brother   . Kidney cancer Brother   . Diabetes Brother   . Neuropathy Brother   . Diabetes Daughter   . Diabetes Daughter   . Diabetes Son    History  Substance Use Topics  . Smoking status: Former Smoker    Types: Cigarettes    Quit date: 03/17/1991  . Smokeless tobacco: Never Used     Comment: Patient states that it has ben greater than 20 years since she quit smoking  . Alcohol Use: No  lives at  home  lives alone Uses a cane   No OB history provided.  Review of Systems  Constitutional: Positive for fever.  Respiratory: Positive for shortness of breath. Negative for cough.   Cardiovascular: Positive for chest pain.  Neurological: Positive for dizziness.  All other systems reviewed and are negative.    Allergies  Codeine; Morphine; Ondansetron; Shellfish allergy; Aspirin; and Penicillins  Home Medications   Current Outpatient Rx  Name  Route  Sig  Dispense  Refill  . Calcium Carbonate-Vitamin D (CALCIUM + D PO)   Oral   Take 1 tablet by mouth daily.          . clopidogrel (PLAVIX) 75 MG tablet   Oral   Take 75 mg by mouth daily.           . Cyanocobalamin (VITAMIN B 12 PO)   Oral   Take 2,000 mcg by mouth daily.         Marland Kitchen dexlansoprazole (DEXILANT) 60 MG capsule   Oral   Take 1 capsule (60 mg total) by mouth daily.   90 capsule   3   . enalapril (VASOTEC) 5 MG tablet   Oral   Take 5 mg by mouth daily.         . fentaNYL (DURAGESIC - DOSED MCG/HR) 75 MCG/HR   Transdermal   Place 1 patch onto the skin every 3 (three) days.         . metFORMIN (GLUCOPHAGE) 1000 MG tablet   Oral   Take 1,000 mg by mouth daily.          Marland Kitchen MISC NATURAL PRODUCTS ER PO   Oral   Take 1-3 capsules by mouth daily. Digestive Blend Softgels         . Pediatric Multivitamins-Iron (FLINTSTONES PLUS IRON PO)   Oral   Take by mouth daily.         . polyethylene glycol powder (GLYCOLAX/MIRALAX) powder   Oral   Take 17 g by mouth daily.         . promethazine (PHENERGAN) 25 MG tablet   Oral   Take 0.5 tablets (12.5 mg total) by mouth 2 (two) times daily as needed for nausea.   30 tablet   1   . silodosin (RAPAFLO) 8 MG CAPS capsule   Oral   Take 8 mg by mouth daily with breakfast.         . Travoprost, BAK Free, (TRAVATAN) 0.004 % SOLN ophthalmic solution   Both Eyes   Place 1 drop into both eyes at bedtime.         Marland Kitchen albuterol (PROVENTIL HFA)  108 (90 BASE) MCG/ACT inhaler   Inhalation   Inhale 2 puffs into the lungs every 6 (six) hours as needed for shortness of breath.          . ALPRAZolam (XANAX XR) 1 MG 24 hr tablet   Oral   Take 0.5-1 mg by mouth daily as needed (Anxiety).          . baclofen (LIORESAL) 10 MG tablet   Oral   Take 1 tablet (10 mg total) by mouth at bedtime.   30 each   3   . docusate sodium (COLACE) 100 MG capsule   Oral   Take 100 mg by mouth as needed for constipation.         . famotidine (PEPCID) 20 MG tablet   Oral   Take 2 tablets (40 mg total) by mouth at bedtime.   90 tablet   3   . fenofibrate micronized (LOFIBRA) 134 MG capsule   Oral   Take 134 mg by mouth daily before breakfast.         . mometasone (NASONEX) 50 MCG/ACT nasal spray   Nasal   Place 2 sprays into the nose daily as needed (Allergies).          . temazepam (RESTORIL) 30 MG capsule   Oral   Take 30 mg by mouth at bedtime as needed for sleep.         Marland Kitchen topiramate (TOPAMAX) 100 MG tablet   Oral   Take 100 mg by mouth at bedtime.          Triage Vitals: BP 126/58  Pulse 44  Temp(Src) 99.4 F (37.4 C) (Oral)  Resp 16  Ht 5\' 1"  (1.549 m)  Wt 130 lb (58.968 kg)  BMI 24.58 kg/m2  SpO2 98%  Vital signs normal except bradycardia   Physical Exam  Nursing note and vitals reviewed. Constitutional: She is oriented to person, place, and time. She appears well-developed and well-nourished. No distress.  HENT:  Head: Normocephalic and atraumatic.  Eyes: EOM are normal.  Neck: Neck supple. No tracheal deviation present.  Cardiovascular: Regular rhythm and normal heart sounds.   No murmur heard. Bradycardia, otherwise normal.   Pulmonary/Chest: Effort normal and breath sounds normal. No respiratory distress. She has no wheezes.  Xiphoid process is angled upwards, but is non-tender.   Musculoskeletal: Normal range of motion.  Neurological: She is alert and oriented to person, place, and time.   Skin: Skin is warm and dry.  Psychiatric: She has a normal mood and affect. Her behavior is normal.    ED Course  Procedures (including critical care time)  Medications  nitroGLYCERIN (NITROGLYN) 2 % ointment 1 inch (1 inch Topical Given 03/24/13 1206)  famotidine (PEPCID) IVPB 20 mg (0 mg Intravenous Stopped 03/24/13 1248)  gi cocktail (Maalox,Lidocaine,Donnatal) (30 mLs Oral Given 03/24/13 1206)     DIAGNOSTIC STUDIES: Oxygen Saturation is 98% on room air, normal by my interpretation.    COORDINATION OF CARE:  11:50 AM- Discussed treatment plan with patient, and the patient agreed to the plan.   Relates no relief with medications.   Orthostatic vital signs were done. Patient did not have hypotension. She did have persistent bradycardia. Her repeat temperature was normal. At this point patient was felt ready for discharge. She requests Phenergan for nausea. She's advised to stop her enalapril and have Dr. Sudie Bailey check her next week to see if her heart rate has improved. Patient is already on a fentanyl patch.  I do not feel I need to add any more pain medication to her regimen.  Labs Review Results for orders placed during the hospital encounter of 03/24/13  CBC WITH DIFFERENTIAL      Result Value Range   WBC 5.5  4.0 - 10.5 K/uL   RBC 3.98  3.87 - 5.11 MIL/uL   Hemoglobin 11.8 (*) 12.0 - 15.0 g/dL   HCT 29.5  28.4 - 13.2 %   MCV 91.5  78.0 - 100.0 fL   MCH 29.6  26.0 - 34.0 pg   MCHC 32.4  30.0 - 36.0 g/dL   RDW 44.0  10.2 - 72.5 %   Platelets 232  150 - 400 K/uL   Neutrophils Relative % 53  43 - 77 %   Neutro Abs 2.9  1.7 - 7.7 K/uL   Lymphocytes Relative 41  12 - 46 %   Lymphs Abs 2.2  0.7 - 4.0 K/uL   Monocytes Relative 5  3 - 12 %   Monocytes Absolute 0.3  0.1 - 1.0 K/uL   Eosinophils Relative 2  0 - 5 %   Eosinophils Absolute 0.1  0.0 - 0.7 K/uL   Basophils Relative 0  0 - 1 %   Basophils Absolute 0.0  0.0 - 0.1 K/uL  COMPREHENSIVE METABOLIC PANEL       Result Value Range   Sodium 135  135 - 145 mEq/L   Potassium 3.8  3.5 - 5.1 mEq/L   Chloride 100  96 - 112 mEq/L   CO2 25  19 - 32 mEq/L   Glucose, Bld 104 (*) 70 - 99 mg/dL   BUN 6  6 - 23 mg/dL   Creatinine, Ser 3.66 (*) 0.50 - 1.10 mg/dL   Calcium 9.9  8.4 - 44.0 mg/dL   Total Protein 7.4  6.0 - 8.3 g/dL   Albumin 4.2  3.5 - 5.2 g/dL   AST 14  0 - 37 U/L   ALT 10  0 - 35 U/L   Alkaline Phosphatase 29 (*) 39 - 117 U/L   Total Bilirubin 0.2 (*) 0.3 - 1.2 mg/dL   GFR calc non Af Amer 46 (*) >90 mL/min   GFR calc Af Amer 54 (*) >90 mL/min  LIPASE, BLOOD      Result Value Range   Lipase 33  11 - 59 U/L  MAGNESIUM      Result Value Range   Magnesium 1.8  1.5 - 2.5 mg/dL  TROPONIN I      Result Value Range   Troponin I <0.30  <0.30 ng/mL  GLUCOSE, CAPILLARY      Result Value Range   Glucose-Capillary 84  70 - 99 mg/dL   Laboratory interpretation all normal     Imaging Review Dg Chest Port 1 View  03/24/2013   CLINICAL DATA:  Chest pain.  EXAM: PORTABLE CHEST - 1 VIEW  COMPARISON:  05/03/2012  FINDINGS: The heart size and mediastinal contours are within normal limits. Both lungs are clear. The visualized skeletal structures are unremarkable. Soft tissue artifact simulates a right pneumothorax.  IMPRESSION: No active disease.   Electronically Signed   By: Geanie Cooley M.D.   On: 03/24/2013 12:18    Mr Brain Wo Contrast  03/02/2013    IMPRESSION: 1. Progression of periventricular and subcortical white matter disease without evidence for acute infarction. This likely reflects the sequela of chronic microvascular ischemia. 2. Postoperative changes of suboccipital craniotomy.   Electronically Signed  By: Gennette Pac   On: 03/02/2013 18:34   Mr Cervical Spine Wo Contrast  03/02/2013   IMPRESSION: 1. Mid stable postoperative changes of the cervical spine with posterior laminectomy and fusion at C1-2 and C2-3. 2. Stable anterior fusion at C4-5, and C6-7. 3. Chronic type 2 dens  fracture without change in alignment. 4. Mild right foraminal narrowing at C6-7 is stable.   Electronically Signed   By: Gennette Pac   On: 03/02/2013 19:05      EKG Interpretation     Ventricular Rate:  47 PR Interval:  174 QRS Duration: 80 QT Interval:  472 QTC Calculation: 417 R Axis:   -14 Text Interpretation:  Sinus bradycardia Otherwise normal ECG When compared with ECG of 02-May-2012 10:47, No significant change was found            MDM  patient has atypical chest pain with no evidence of acute myocardial event. She is noted to have a persistent bradycardia that is not symptomatic i.e. no hypotension. She was also not hypoxic. Patient had recent MRIs her cervical spine which may account for some of her discomfort in her arm. She was felt to be adequately medical screens and can be discharged.    1. Atypical chest pain   2. Nausea and vomiting   3. Bradycardia     New Prescriptions   PROMETHAZINE (PHENERGAN) 12.5 MG TABLET    Take 1 tablet (12.5 mg total) by mouth every 8 (eight) hours as needed for nausea.    Plan discharge  Devoria Albe, MD, Franz Dell, MD 03/24/13 [phone]

## 2013-03-24 NOTE — ED Notes (Signed)
Pt assisted with BSC. Pt provided with an additional blanket. Pt medicated for pain. Family at bedside. Pt states pain to chest constantly x 1 mo or more. States pain began to get worse a couple of weeks ago along with intermittent dizziness  And intermittent vomiting and nausea. NAD at this time.

## 2013-03-24 NOTE — Telephone Encounter (Signed)
Has upper abd pain, chest pain, nausea, vomiting and headache. Wants apt with Dr. Karilyn Cota. Haizley Cannella he was not in the office today and the only available apt would be with [name]. Patient refused to see TS. Creola Corn she needed to see her PCP and she is not wanting to see him either. Baani said he will not be back in the office until tomorrow. Asked if she should go to the hospital? Her return phone number is [phone].

## 2013-03-24 NOTE — ED Notes (Signed)
Pt currently eating ice chips. Pt states pressure is not better "but pain is a little better" Rating pain at a 3. NAD.

## 2013-03-25 ENCOUNTER — Emergency Department (HOSPITAL_COMMUNITY)
Admission: EM | Admit: 2013-03-25 | Discharge: 2013-03-25 | Disposition: A | Discharge status: Home / Self Care | Payer: Medicare Other | Attending: Emergency Medicine | Admitting: Emergency Medicine

## 2013-03-25 ENCOUNTER — Encounter (HOSPITAL_COMMUNITY): Payer: Self-pay | Admitting: Emergency Medicine

## 2013-03-25 ENCOUNTER — Emergency Department (HOSPITAL_COMMUNITY): Payer: Medicare Other

## 2013-03-25 DIAGNOSIS — IMO0001 Reserved for inherently not codable concepts without codable children: Secondary | ICD-10-CM | POA: Insufficient documentation

## 2013-03-25 DIAGNOSIS — R42 Dizziness and giddiness: Secondary | ICD-10-CM | POA: Insufficient documentation

## 2013-03-25 DIAGNOSIS — R0602 Shortness of breath: Secondary | ICD-10-CM | POA: Insufficient documentation

## 2013-03-25 DIAGNOSIS — G8929 Other chronic pain: Secondary | ICD-10-CM | POA: Insufficient documentation

## 2013-03-25 DIAGNOSIS — Z8673 Personal history of transient ischemic attack (TIA), and cerebral infarction without residual deficits: Secondary | ICD-10-CM | POA: Insufficient documentation

## 2013-03-25 DIAGNOSIS — Z880 Allergy status to penicillin: Secondary | ICD-10-CM | POA: Insufficient documentation

## 2013-03-25 DIAGNOSIS — F411 Generalized anxiety disorder: Secondary | ICD-10-CM | POA: Insufficient documentation

## 2013-03-25 DIAGNOSIS — IMO0002 Reserved for concepts with insufficient information to code with codable children: Secondary | ICD-10-CM | POA: Insufficient documentation

## 2013-03-25 DIAGNOSIS — R001 Bradycardia, unspecified: Secondary | ICD-10-CM

## 2013-03-25 DIAGNOSIS — Z7902 Long term (current) use of antithrombotics/antiplatelets: Secondary | ICD-10-CM | POA: Insufficient documentation

## 2013-03-25 DIAGNOSIS — I498 Other specified cardiac arrhythmias: Secondary | ICD-10-CM | POA: Insufficient documentation

## 2013-03-25 DIAGNOSIS — R110 Nausea: Secondary | ICD-10-CM | POA: Insufficient documentation

## 2013-03-25 DIAGNOSIS — R0789 Other chest pain: Secondary | ICD-10-CM

## 2013-03-25 DIAGNOSIS — D649 Anemia, unspecified: Secondary | ICD-10-CM | POA: Insufficient documentation

## 2013-03-25 DIAGNOSIS — R51 Headache: Secondary | ICD-10-CM | POA: Insufficient documentation

## 2013-03-25 DIAGNOSIS — E119 Type 2 diabetes mellitus without complications: Secondary | ICD-10-CM | POA: Insufficient documentation

## 2013-03-25 DIAGNOSIS — Z87891 Personal history of nicotine dependence: Secondary | ICD-10-CM | POA: Insufficient documentation

## 2013-03-25 DIAGNOSIS — Z87448 Personal history of other diseases of urinary system: Secondary | ICD-10-CM | POA: Insufficient documentation

## 2013-03-25 DIAGNOSIS — Z79899 Other long term (current) drug therapy: Secondary | ICD-10-CM | POA: Insufficient documentation

## 2013-03-25 DIAGNOSIS — I10 Essential (primary) hypertension: Secondary | ICD-10-CM | POA: Insufficient documentation

## 2013-03-25 DIAGNOSIS — Z8589 Personal history of malignant neoplasm of other organs and systems: Secondary | ICD-10-CM | POA: Insufficient documentation

## 2013-03-25 DIAGNOSIS — R071 Chest pain on breathing: Secondary | ICD-10-CM | POA: Insufficient documentation

## 2013-03-25 DIAGNOSIS — K219 Gastro-esophageal reflux disease without esophagitis: Secondary | ICD-10-CM | POA: Insufficient documentation

## 2013-03-25 HISTORY — DX: Anxiety disorder, unspecified: F41.9

## 2013-03-25 HISTORY — DX: Heartburn: R12

## 2013-03-25 HISTORY — DX: Headache: R51

## 2013-03-25 HISTORY — DX: Dizziness and giddiness: R42

## 2013-03-25 HISTORY — DX: Cervicalgia: M54.2

## 2013-03-25 HISTORY — DX: Other intervertebral disc degeneration, lumbar region: M51.36

## 2013-03-25 HISTORY — DX: Headache, unspecified: R51.9

## 2013-03-25 HISTORY — DX: Other chronic pain: G89.29

## 2013-03-25 HISTORY — DX: Diaphragmatic hernia without obstruction or gangrene: K44.9

## 2013-03-25 HISTORY — DX: Dorsalgia, unspecified: M54.9

## 2013-03-25 HISTORY — DX: Other intervertebral disc degeneration, lumbar region without mention of lumbar back pain or lower extremity pain: M51.369

## 2013-03-25 LAB — CBC WITH DIFFERENTIAL/PLATELET
Basophils Absolute: 0 10*3/uL (ref 0.0–0.1)
Basophils Relative: 1 % (ref 0–1)
Eosinophils Absolute: 0.1 10*3/uL (ref 0.0–0.7)
Eosinophils Relative: 2 % (ref 0–5)
HCT: 35.7 % — ABNORMAL LOW (ref 36.0–46.0)
Hemoglobin: 11.6 g/dL — ABNORMAL LOW (ref 12.0–15.0)
Lymphocytes Relative: 37 % (ref 12–46)
Lymphs Abs: 2.3 10*3/uL (ref 0.7–4.0)
MCH: 29.7 pg (ref 26.0–34.0)
MCHC: 32.5 g/dL (ref 30.0–36.0)
MCV: 91.3 fL (ref 78.0–100.0)
Monocytes Absolute: 0.2 10*3/uL (ref 0.1–1.0)
Monocytes Relative: 4 % (ref 3–12)
Neutro Abs: 3.4 10*3/uL (ref 1.7–7.7)
Neutrophils Relative %: 56 % (ref 43–77)
Platelets: 232 10*3/uL (ref 150–400)
RBC: 3.91 MIL/uL (ref 3.87–5.11)
RDW: 13.3 % (ref 11.5–15.5)
WBC: 6 10*3/uL (ref 4.0–10.5)

## 2013-03-25 LAB — URINALYSIS W MICROSCOPIC + REFLEX CULTURE
Bilirubin Urine: NEGATIVE
Glucose, UA: NEGATIVE mg/dL
Hgb urine dipstick: NEGATIVE
Ketones, ur: NEGATIVE mg/dL
Leukocytes, UA: NEGATIVE
Nitrite: NEGATIVE
Protein, ur: NEGATIVE mg/dL
Specific Gravity, Urine: <1.005 — ABNORMAL LOW (ref 1.005–1.030)
Urine-Other: NONE SEEN
Urobilinogen, UA: 0.2 mg/dL (ref 0.0–1.0)
pH: 7 (ref 5.0–8.0)

## 2013-03-25 LAB — COMPREHENSIVE METABOLIC PANEL
ALT: 10 U/L (ref 0–35)
AST: 15 U/L (ref 0–37)
Albumin: 4.1 g/dL (ref 3.5–5.2)
Alkaline Phosphatase: 28 U/L — ABNORMAL LOW (ref 39–117)
BUN: 7 mg/dL (ref 6–23)
CO2: 25 mEq/L (ref 19–32)
Calcium: 10.3 mg/dL (ref 8.4–10.5)
Chloride: 103 mEq/L (ref 96–112)
Creatinine, Ser: 1.1 mg/dL (ref 0.50–1.10)
GFR calc Af Amer: 57 mL/min — ABNORMAL LOW (ref >90)
GFR calc non Af Amer: 49 mL/min — ABNORMAL LOW (ref >90)
Glucose, Bld: 92 mg/dL (ref 70–99)
Potassium: 3.9 mEq/L (ref 3.5–5.1)
Sodium: 137 mEq/L (ref 135–145)
Total Bilirubin: 0.2 mg/dL — ABNORMAL LOW (ref 0.3–1.2)
Total Protein: 7.1 g/dL (ref 6.0–8.3)

## 2013-03-25 LAB — TROPONIN I: Troponin I: <0.3 ng/mL (ref <0.30)

## 2013-03-25 LAB — LIPASE, BLOOD: Lipase: 34 U/L (ref 11–59)

## 2013-03-25 LAB — GLUCOSE, CAPILLARY: Glucose-Capillary: 68 mg/dL — ABNORMAL LOW (ref 70–99)

## 2013-03-25 MED ORDER — PROMETHAZINE HCL 25 MG/ML IJ SOLN
12.5000 mg | Freq: Once | INTRAMUSCULAR | Status: AC
  Administered 2013-03-25: 12.5 mg via INTRAVENOUS
  Filled 2013-03-25: qty 1

## 2013-03-25 MED ORDER — METOCLOPRAMIDE HCL 5 MG/ML IJ SOLN
INTRAMUSCULAR | Status: AC
  Filled 2013-03-25: qty 2

## 2013-03-25 MED ORDER — FAMOTIDINE IN NACL 20-0.9 MG/50ML-% IV SOLN
20.0000 mg | Freq: Once | INTRAVENOUS | Status: AC
  Administered 2013-03-25: 20 mg via INTRAVENOUS
  Filled 2013-03-25: qty 50

## 2013-03-25 MED ORDER — SODIUM CHLORIDE 0.9 % IV SOLN
INTRAVENOUS | Status: DC
  Administered 2013-03-25: 16:00:00 via INTRAVENOUS

## 2013-03-25 MED ORDER — METOCLOPRAMIDE HCL 5 MG/ML IJ SOLN: 5.0000 mg | Freq: Once | INTRAMUSCULAR | Status: DC

## 2013-03-25 MED ORDER — GI COCKTAIL ~~LOC~~
30.0000 mL | Freq: Once | ORAL | Status: DC
  Filled 2013-03-25: qty 30

## 2013-03-25 MED ORDER — IOHEXOL 350 MG/ML SOLN
100.0000 mL | Freq: Once | INTRAVENOUS | Status: AC | PRN
  Administered 2013-03-25: 100 mL via INTRAVENOUS

## 2013-03-25 NOTE — ED Notes (Signed)
Pt unable to void @ this time

## 2013-03-25 NOTE — ED Notes (Signed)
Pt c/o pain all over chest and down left arm over one month now. Has seen dr Juanetta Gosling a few times and here yesterday for same. Pt states does not feel like it is her "stomach" problems. States pain is sharp and sometimes "just hurts". "sometimes" sob but not now. "sometimes" blurred vision and dizziness which pt states is present now. Pt stable from w/c to bed. Nad. Nondiaphoretic. "terrible" headache at present. Nausea that is chronic and not worse at tthis time. Denies v/d. No sob noted.

## 2013-03-25 NOTE — ED Provider Notes (Signed)
CSN: [id number]     Arrival date & time 03/25/13  1440 History   First MD Initiated Contact with Patient 03/25/13 1453     Chief Complaint  Patient presents with  . Chest Pain    HPI Pt was seen at 1500.  Per pt, c/o gradual onset and persistence of constant generalized chest "pain" for the past 1 month, worse over the past 3 days.  Has been associated with intermittent SOB.  Describes the chest pain as constant, "aching," "sharp" and "hurting," with radiation into her left arm.  Pt also c/o acute flair of her chronic nausea, "dizziness," and "headache" for the past several years. Pt states she was evaluated in the ED yesterday for these same symptoms, and f/u with her PMD today PTA. Pt states she came back to the ED today because her symptoms are "still there." Denies any changes from her usual chronic longstanding symptoms; no new symptoms since yesterday's ED visit. Denies headache was sudden or maximal in onset or at any time.  Denies visual changes, no focal motor weakness, no tingling/numbness in extremities, no fevers, no change in her chronic neck pain.  Denies vomiting/diarrhea, no fevers, no back pain, no rash, no palpitations, no cough, no black or blood in stools, no abd pain.        Past Medical History  Diagnosis Date  . Nausea   . Bloating   . Dysphagia   . Gastroesophageal reflux   . Gastroparesis   . Anemia   . Diabetes mellitus   . TIA (transient ischemic attack)   . PONV (postoperative nausea and vomiting)   . Fibromyalgia   . Cancer   . Hypertension   . Renal insufficiency   . Chronic headaches   . Chronic neck pain   . Chronic back pain   . DDD (degenerative disc disease), lumbar   . Hiatal hernia   . Dizziness   . Chronic heartburn   . Chronic pain   . Anxiety    Past Surgical History  Procedure Laterality Date  . Hemicolectomy  ZIEGLER  . Colonoscopy  10/11/2010  . Colonoscopy  11/23/2009  . Colonoscopy  07/28/2008    W/SNARE  . Colonoscopy   06/28/07  . Colonoscopy  05/10/07    W/POLYP  . Colonoscopy  12/28/00  . Upper gastrointestinal endoscopy  10/11/2010    EGD ED  . Upper gastrointestinal endoscopy  11/23/2009  . Upper gastrointestinal endoscopy  05/10/07    EGD ED  . Upper gastrointestinal endoscopy  08/11/01    EGD ED  . Cervical spine surgery    . Cholecystectomy    . Hiatal hernia repair    . Abdominal hysterectomy     Family History  Problem Relation Age of Onset  . Heart disease Mother   . Diabetes Mother   . Dementia Father   . Healthy Sister   . Diabetes Brother   . Kidney cancer Brother   . Diabetes Brother   . Neuropathy Brother   . Diabetes Daughter   . Diabetes Daughter   . Diabetes Son    History  Substance Use Topics  . Smoking status: Former Smoker    Types: Cigarettes    Quit date: 03/17/1991  . Smokeless tobacco: Never Used     Comment: Patient states that it has ben greater than 20 years since she quit smoking  . Alcohol Use: No    Review of Systems ROS: Statement: All systems negative except as marked or  noted in the HPI; Constitutional: Negative for fever and chills. ; ; Eyes: Negative for eye pain, redness and discharge. ; ; ENMT: Negative for ear pain, hoarseness, nasal congestion, sinus pressure and sore throat. ; ; Cardiovascular: +CP, SOB. Negative for palpitations, diaphoresis, and peripheral edema. ; ; Respiratory: Negative for cough, wheezing and stridor. ; ; Gastrointestinal: +nausea. Negative for vomiting, diarrhea, abdominal pain, blood in stool, hematemesis, jaundice and rectal bleeding. ; ; Genitourinary: Negative for dysuria, flank pain and hematuria. ; ; Musculoskeletal: Negative for back pain and neck pain. Negative for swelling and trauma.; ; Skin: Negative for pruritus, rash, abrasions, blisters, bruising and skin lesion.; ; Neuro: +headache. Negative for lightheadedness and neck stiffness. Negative for weakness, altered level of consciousness , altered mental status,  extremity weakness, paresthesias, involuntary movement, seizure and syncope.      Allergies  Cefuroxime axetil; Codeine; Diltiazem; Morphine; Ondansetron; Shellfish allergy; Aspirin; and Penicillins  Home Medications   Current Outpatient Rx  Name  Route  Sig  Dispense  Refill  . albuterol (PROVENTIL HFA) 108 (90 BASE) MCG/ACT inhaler   Inhalation   Inhale 2 puffs into the lungs every 6 (six) hours as needed for shortness of breath.          . ALPRAZolam (XANAX XR) 1 MG 24 hr tablet   Oral   Take 0.5-1 mg by mouth daily as needed (Anxiety).          . Calcium Carbonate-Vitamin D (CALCIUM + D PO)   Oral   Take 1 tablet by mouth daily.          . clopidogrel (PLAVIX) 75 MG tablet   Oral   Take 75 mg by mouth daily.           . Cyanocobalamin (VITAMIN B 12 PO)   Oral   Take 2,000 mcg by mouth daily.         Marland Kitchen dexlansoprazole (DEXILANT) 60 MG capsule   Oral   Take 1 capsule (60 mg total) by mouth daily.   90 capsule   3   . docusate sodium (COLACE) 100 MG capsule   Oral   Take 100 mg by mouth as needed for constipation.         . enalapril (VASOTEC) 5 MG tablet   Oral   Take 5 mg by mouth daily.         . famotidine (PEPCID) 20 MG tablet   Oral   Take 2 tablets (40 mg total) by mouth at bedtime.   90 tablet   3   . fenofibrate micronized (LOFIBRA) 134 MG capsule   Oral   Take 134 mg by mouth daily before breakfast.         . fentaNYL (DURAGESIC - DOSED MCG/HR) 75 MCG/HR   Transdermal   Place 1 patch onto the skin every 3 (three) days.         . metFORMIN (GLUCOPHAGE) 1000 MG tablet   Oral   Take 1,000 mg by mouth daily.          Marland Kitchen MISC NATURAL PRODUCTS ER PO   Oral   Take 1-3 capsules by mouth daily. Digestive Blend Softgels         . mometasone (NASONEX) 50 MCG/ACT nasal spray   Nasal   Place 2 sprays into the nose daily as needed (Allergies).          . Pediatric Multivitamins-Iron (FLINTSTONES PLUS IRON PO)   Oral   Take  by  mouth daily.         . polyethylene glycol powder (GLYCOLAX/MIRALAX) powder   Oral   Take 17 g by mouth daily.         . silodosin (RAPAFLO) 8 MG CAPS capsule   Oral   Take 8 mg by mouth daily with breakfast.         . temazepam (RESTORIL) 30 MG capsule   Oral   Take 30 mg by mouth at bedtime as needed for sleep.         Marland Kitchen topiramate (TOPAMAX) 100 MG tablet   Oral   Take 100 mg by mouth at bedtime.         . Travoprost, BAK Free, (TRAVATAN) 0.004 % SOLN ophthalmic solution   Both Eyes   Place 1 drop into both eyes at bedtime.         . baclofen (LIORESAL) 10 MG tablet   Oral   Take 1 tablet (10 mg total) by mouth at bedtime.   30 each   3   . promethazine (PHENERGAN) 12.5 MG tablet   Oral   Take 1 tablet (12.5 mg total) by mouth every 8 (eight) hours as needed for nausea.   10 tablet   0   . promethazine (PHENERGAN) 25 MG tablet   Oral   Take 0.5 tablets (12.5 mg total) by mouth 2 (two) times daily as needed for nausea.   30 tablet   1    BP 130/62  Pulse 45  Temp(Src) 97.8 F (36.6 C) (Oral)  Resp 13  SpO2 100% Physical Exam 1505: Physical examination:  Nursing notes reviewed; Vital signs and O2 SAT reviewed;  Constitutional: Well developed, Well nourished, Well hydrated, In no acute distress; Head:  Normocephalic, atraumatic; Eyes: EOMI, PERRL, No scleral icterus; ENMT: Mouth and pharynx normal, Mucous membranes moist; Neck: Supple, Full range of motion, No lymphadenopathy; Cardiovascular: Regular rate and rhythm, No gallop; Respiratory: Breath sounds clear & equal bilaterally, No wheezes.  Speaking full sentences with ease, Normal respiratory effort/excursion; Chest: Nontender, Movement normal; Abdomen: Soft, Nontender, Nondistended, Normal bowel sounds; Genitourinary: No CVA tenderness; Extremities: Pulses normal, No tenderness, No edema, No calf edema or asymmetry.; Neuro: AA&Ox3, Major CN grossly intact. No facial droop. Speech clear. Climbs on  and off stretcher easily by herself. Gait steady. No gross focal motor or sensory deficits in extremities.; Skin: Color normal, Warm, Dry.   ED Course  Procedures  1545:  Pt refusing GI cocktail and reglan saying they "make me sick." Reminded she had GI cocktail yesterday, but she continues to refuse, stating she "can only take phenergan."  Will dose.  1640: Pt not orthostatic during VS, has been ambulatory while in the ED several times with her usual baseline gait, easy resps. Denies CP/SOB. HR in 40's and 50's, NSR on monitor. Pt asymptomatic. EPIC chart reviewed: pt has had intermittent HR in that range over the past several years. Pt reporting multiple ongoing GI complaints "for years," including inability to "eat anything at all." Pt currently eating peanut butter and crackers without CP, abd pain, nausea or vomiting. Pt encouraged to eat what foods/fluids she could tolerate and f/u with her GI MD for good continuity of care and control of her multiple chronic GI symptoms. Awaiting urine sample. Pt and family aware.   1745:  Doubt PE as cause for symptoms with normal CT-A chest.  Doubt ACS as cause for symptoms with normal troponin and unchanged EKG from previous after 1 month,  worse over 3 days, of constant symptoms. Pt has tol PO well while in the ED without N/V.  No stooling while in the ED.  Abd remains benign, VSS. Feels better after meds (IV pepcid and phenergan) and wants to go home now. Pt states she already as rx for phenergan and has scheduled an appt with her GI MD on 10/28. Dx and testing from the past 2 ED visits d/w pt and family. Questions answered.  Verb understanding, agreeable to d/c home with outpt f/u.      EKG Interpretation     Ventricular Rate:  46 PR Interval:  156 QRS Duration: 86 QT Interval:  484 QTC Calculation: 423 R Axis:   7 Text Interpretation:  Sinus bradycardia Artifact Otherwise normal ECG When compared with ECG of 24-Mar-2013 10:59, No significant  change was found            MDM  MDM Reviewed: nursing note, previous chart and vitals Reviewed previous: labs, ECG, x-ray, CT scan and MRI Interpretation: labs, ECG, x-ray and CT scan     Results for orders placed during the hospital encounter of 03/25/13  GLUCOSE, CAPILLARY      Result Value Range   Glucose-Capillary 68 (*) 70 - 99 mg/dL  CBC WITH DIFFERENTIAL      Result Value Range   WBC 6.0  4.0 - 10.5 K/uL   RBC 3.91  3.87 - 5.11 MIL/uL   Hemoglobin 11.6 (*) 12.0 - 15.0 g/dL   HCT 40.9 (*) 81.1 - 91.4 %   MCV 91.3  78.0 - 100.0 fL   MCH 29.7  26.0 - 34.0 pg   MCHC 32.5  30.0 - 36.0 g/dL   RDW 78.2  95.6 - 21.3 %   Platelets 232  150 - 400 K/uL   Neutrophils Relative % 56  43 - 77 %   Neutro Abs 3.4  1.7 - 7.7 K/uL   Lymphocytes Relative 37  12 - 46 %   Lymphs Abs 2.3  0.7 - 4.0 K/uL   Monocytes Relative 4  3 - 12 %   Monocytes Absolute 0.2  0.1 - 1.0 K/uL   Eosinophils Relative 2  0 - 5 %   Eosinophils Absolute 0.1  0.0 - 0.7 K/uL   Basophils Relative 1  0 - 1 %   Basophils Absolute 0.0  0.0 - 0.1 K/uL  COMPREHENSIVE METABOLIC PANEL      Result Value Range   Sodium 137  135 - 145 mEq/L   Potassium 3.9  3.5 - 5.1 mEq/L   Chloride 103  96 - 112 mEq/L   CO2 25  19 - 32 mEq/L   Glucose, Bld 92  70 - 99 mg/dL   BUN 7  6 - 23 mg/dL   Creatinine, Ser 0.86  0.50 - 1.10 mg/dL   Calcium 57.8  8.4 - 46.9 mg/dL   Total Protein 7.1  6.0 - 8.3 g/dL   Albumin 4.1  3.5 - 5.2 g/dL   AST 15  0 - 37 U/L   ALT 10  0 - 35 U/L   Alkaline Phosphatase 28 (*) 39 - 117 U/L   Total Bilirubin 0.2 (*) 0.3 - 1.2 mg/dL   GFR calc non Af Amer 49 (*) >90 mL/min   GFR calc Af Amer 57 (*) >90 mL/min  LIPASE, BLOOD      Result Value Range   Lipase 34  11 - 59 U/L  URINALYSIS W MICROSCOPIC + REFLEX CULTURE  Result Value Range   Color, Urine YELLOW  YELLOW   APPearance CLEAR  CLEAR   Specific Gravity, Urine <1.005 (*) 1.005 - 1.030   pH 7.0  5.0 - 8.0   Glucose, UA NEGATIVE   NEGATIVE mg/dL   Hgb urine dipstick NEGATIVE  NEGATIVE   Bilirubin Urine NEGATIVE  NEGATIVE   Ketones, ur NEGATIVE  NEGATIVE mg/dL   Protein, ur NEGATIVE  NEGATIVE mg/dL   Urobilinogen, UA 0.2  0.0 - 1.0 mg/dL   Nitrite NEGATIVE  NEGATIVE   Leukocytes, UA NEGATIVE  NEGATIVE   Urine-Other       Value: NO FORMED ELEMENTS SEEN ON URINE MICROSCOPIC EXAMINATION  TROPONIN I      Result Value Range   Troponin I <0.30  <0.30 ng/mL   Dg Chest 2 View 03/25/2013   CLINICAL DATA:  Chest pain. Dizziness.  EXAM: CHEST  2 VIEW  COMPARISON:  Multiple priors  FINDINGS: The cardiomediastinal silhouette is unchanged. No focal infiltrate or edema. No pleural effusion or pneumothorax. No acute osseous abnormality.  IMPRESSION: No active cardiopulmonary disease.   Electronically Signed   By: Jerene Dilling M.D.   On: 03/25/2013 16:04   Ct Angio Chest Pe W/cm &/or Wo Cm 03/25/2013   CLINICAL DATA:  Chest pain  EXAM: CT ANGIOGRAPHY CHEST WITH CONTRAST  TECHNIQUE: Multidetector CT imaging of the chest was performed using the standard protocol during bolus administration of intravenous contrast. Multiplanar CT image reconstructions including MIPs were obtained to evaluate the vascular anatomy.  CONTRAST:  OMNIPAQUE IOHEXOL 350 MG/ML SOLN  COMPARISON:  03/05/2010  FINDINGS: No filling defects in the pulmonary arterial tree to suggest acute pulmonary thromboembolism.  No evidence of aortic dissection. Prominent calcifications at the origin of the left subclavian artery. No obvious narrowing. Prominent calcifications at the origin of the left vertebral artery. Minimal calcification at the origin of the right vertebral artery.  Coronary artery calcifications effect left main, left anterior descending, circumflex, and right coronary arteries.  No pericardial effusion. No abnormal adenopathy.  Moderate-sized hiatal hernia.  No pneumothorax or pleural effusion  Tiny area of ground-glass in the right upper lobe on image  33. This is nonspecific and likely minimal focal pneumonitis.  No acute bony deformity. Minimal degenerative change in the thoracic spine.  Post cholecystectomy  Stable left adrenal pattern normal.  Review of the MIP images confirms the above findings.  IMPRESSION: No evidence of acute pulmonary thromboembolism.  Chronic findings are stable.   Electronically Signed   By: Maryclare Bean M.D.   On: 03/25/2013 16:08        Laray Anger, DO 03/27/13 2130

## 2013-03-25 NOTE — ED Notes (Signed)
Patient with no complaints at this time. Respirations even and unlabored. Skin warm/dry. Discharge instructions reviewed with patient at this time. Patient given opportunity to voice concerns/ask questions. IV removed per policy and band-aid applied to site. Patient discharged at this time and left Emergency Department with steady gait.  

## 2013-03-28 ENCOUNTER — Telehealth (INDEPENDENT_AMBULATORY_CARE_PROVIDER_SITE_OTHER): Payer: Self-pay | Admitting: *Deleted

## 2013-03-28 NOTE — Telephone Encounter (Signed)
Call returned; patient asked to hold lofbra. Check with Dr. Sudie Bailey if fentanyl dose could be reduced. Progress report in 2 days.

## 2013-03-28 NOTE — Telephone Encounter (Signed)
Spoke with Dr.Rehman about this, he is going to look at the CT and call the patient. Forwarded to Dr.Rehman as a reminder.

## 2013-03-28 NOTE — Telephone Encounter (Signed)
[name] has been sick for several weeks. She was in the ED on Thursday and Friday of last week. She would like to see if Dr. Karilyn Cota would look at the CT scan and see if there is anything he can do for her. Her return phone number is [phone].

## 2013-03-30 NOTE — Telephone Encounter (Signed)
Patient was called and made aware of Dr.Rehman's recommendations. She states that on 03/29/13 she was able to eat solids, not a lot , but it stayed down. She says that she had real bad heart burn last night though, she says that she doesn't use the pain patch and she had stopped the cholesterol medication the other night when she talked with Dr.Rehman. I ask that she call me back on Friday prior to 12 with a progress report.

## 2013-03-30 NOTE — Telephone Encounter (Signed)
[name] did eat solid food yesterday and it staid down. She is also feeling a little better.

## 2013-04-05 ENCOUNTER — Ambulatory Visit (INDEPENDENT_AMBULATORY_CARE_PROVIDER_SITE_OTHER): Payer: Medicare Other | Admitting: Internal Medicine

## 2013-04-05 ENCOUNTER — Encounter (INDEPENDENT_AMBULATORY_CARE_PROVIDER_SITE_OTHER): Payer: Self-pay | Admitting: Internal Medicine

## 2013-04-05 VITALS — BP 110/68 | HR 60 | Temp 98.5°F | Resp 18 | Ht 61.0 in | Wt 128.4 lb

## 2013-04-05 DIAGNOSIS — K3184 Gastroparesis: Secondary | ICD-10-CM

## 2013-04-05 DIAGNOSIS — R634 Abnormal weight loss: Secondary | ICD-10-CM

## 2013-04-05 NOTE — Patient Instructions (Signed)
Eat 6 small meals daily. Notify if symptoms relapse

## 2013-04-05 NOTE — Progress Notes (Signed)
Presenting complaint;  Nausea vomiting and weight loss.  Subjective:  Patient is 72 year old Caucasian female who has diabetic gastroparesis and was last seen about 5 weeks ago. She was seen in emergency room on 03/25/2013 for nausea vomiting and chest pain. She was felt to have chest wall pain. Troponin level was was normal. LFTs are also normal. Patient was treated and discharged and was not feeling better and therefore requested this visit. She is accompanied by her daughter. She has been feeling better for the last 2 days. She has been keeping her food down. Her daughter states she ate half of subbase sandwich with ham yesterday and kept it down. She continues to complain of nausea frequent burping and intermittent heartburn. Heartburn is relieved with OTC medications. She is not experiencing any side effects with domperidone. Her weight is down only by 1 pounds since her last visit of 02/22/2013. She is not taking 50 mcg fentanyl patch instead of 75. She denies melena rectal bleeding fever or chills. She passes soft stool as long as she takes MiraLax and stool softener.  Current Medications: Current Outpatient Prescriptions  Medication Sig Dispense Refill  . albuterol (PROVENTIL HFA) 108 (90 BASE) MCG/ACT inhaler Inhale 2 puffs into the lungs every 6 (six) hours as needed for shortness of breath.       . ALPRAZolam (XANAX XR) 1 MG 24 hr tablet Take 0.5-1 mg by mouth daily as needed (Anxiety).       . Calcium Carbonate-Vitamin D (CALCIUM + D PO) Take 1 tablet by mouth daily.       . clopidogrel (PLAVIX) 75 MG tablet Take 75 mg by mouth daily.        . Cyanocobalamin (VITAMIN B 12 PO) Take 2,000 mcg by mouth daily.      Marland Kitchen dexlansoprazole (DEXILANT) 60 MG capsule Take 1 capsule (60 mg total) by mouth daily.  90 capsule  3  . docusate sodium (COLACE) 100 MG capsule Take 100 mg by mouth as needed for constipation.      . enalapril (VASOTEC) 5 MG tablet Take 5 mg by mouth daily.      .  famotidine (PEPCID) 20 MG tablet Take 2 tablets (40 mg total) by mouth at bedtime.  90 tablet  3  . fentaNYL (DURAGESIC - DOSED MCG/HR) 75 MCG/HR Place 1 patch onto the skin every 3 (three) days. Patient states that she is using 50 mcg patch a day      . metFORMIN (GLUCOPHAGE) 1000 MG tablet Take 1,000 mg by mouth daily.       Marland Kitchen MISC NATURAL PRODUCTS ER PO Take 1-3 capsules by mouth daily. Digestive Blend Softgels      . mometasone (NASONEX) 50 MCG/ACT nasal spray Place 2 sprays into the nose daily as needed (Allergies).       . Pediatric Multivitamins-Iron (FLINTSTONES PLUS IRON PO) Take by mouth daily.      . polyethylene glycol powder (GLYCOLAX/MIRALAX) powder Take 17 g by mouth daily.      . promethazine (PHENERGAN) 12.5 MG tablet Take 1 tablet (12.5 mg total) by mouth every 8 (eight) hours as needed for nausea.  10 tablet  0  . silodosin (RAPAFLO) 8 MG CAPS capsule Take 8 mg by mouth daily with breakfast.      . temazepam (RESTORIL) 30 MG capsule Take 30 mg by mouth at bedtime as needed for sleep.      Marland Kitchen topiramate (TOPAMAX) 100 MG tablet Take 100 mg by mouth at  bedtime.      . Travoprost, BAK Free, (TRAVATAN) 0.004 % SOLN ophthalmic solution Place 1 drop into both eyes at bedtime.       No current facility-administered medications for this visit.     Objective: Blood pressure 110/68, pulse 60, temperature 98.5 F (36.9 C), temperature source Oral, resp. rate 18, height 5\' 1"  (1.549 m), weight 128 lb 6.4 oz (58.242 kg). Patient is alert and in no acute distress. Conjunctiva is pink. Sclera is nonicteric Oropharyngeal mucosa is normal. No neck masses or thyromegaly noted Abdomen is flat. Bowel sounds are normal. On palpation abdomen is soft with mild midepigastric tenderness. No organomegaly or masses. No LE edema or clubbing noted.  Labs/studies Results: Lab data from 03/25/2013 reviewed. H&H was 11.6 and 35.7. Comprehensive chemistry panel normal. Lipase was 34. Troponin level  less than 0.30.     Assessment:  #1. Diabetic gastroparesis. She appears to be doing better with current dose of domperidone which is 50 mg per day. She may have experienced gastroenteritis resulting in recent flareup and visit to the emergency room. Dose reduction and fentanyl may also be helping. #2. History of CRC. Will delay colonoscopy until after next visit #3. Weight loss. She has stopped losing weight. Weight loss appears to be due to chronic GI symptoms and diminished calorie intake    Plan:  Patient advised to eat 6 small meals daily. Furthermore she needs to stick with foods that do not make her sick. Her daughter will help her make a list of such foods. Office visit in 2 months.

## 2013-04-06 ENCOUNTER — Ambulatory Visit (INDEPENDENT_AMBULATORY_CARE_PROVIDER_SITE_OTHER): Payer: Medicare Other | Admitting: Cardiovascular Disease

## 2013-04-06 ENCOUNTER — Encounter: Payer: Self-pay | Admitting: *Deleted

## 2013-04-06 ENCOUNTER — Encounter: Payer: Self-pay | Admitting: Cardiovascular Disease

## 2013-04-06 VITALS — BP 127/66 | HR 60 | Ht 63.0 in | Wt 130.0 lb

## 2013-04-06 DIAGNOSIS — I2510 Atherosclerotic heart disease of native coronary artery without angina pectoris: Secondary | ICD-10-CM

## 2013-04-06 DIAGNOSIS — R079 Chest pain, unspecified: Secondary | ICD-10-CM

## 2013-04-06 DIAGNOSIS — I498 Other specified cardiac arrhythmias: Secondary | ICD-10-CM

## 2013-04-06 DIAGNOSIS — I10 Essential (primary) hypertension: Secondary | ICD-10-CM

## 2013-04-06 DIAGNOSIS — R001 Bradycardia, unspecified: Secondary | ICD-10-CM

## 2013-04-06 NOTE — Progress Notes (Signed)
Patient ID: [name], female   DOB: March 23, 1941, 72 y.o.   MRN: [id number]       CARDIOLOGY CONSULT NOTE  Patient ID: [name] MRN: [id number] DOB/AGE: 1940-09-15 72 y.o.  Admit date: (Not on file) Primary Physician Tammy Obey, MD  Reason for Consultation: chest pain  HPI: [name] is a 72 yr old woman who presents for the evaluation of chest pain. She was recently seen at the [facility] ED, where a CT angiogram of the chest was done which revealed coronary artery calcifications affecting the left main, left anterior descending, circumflex, and right coronary arteries. An ECG revealed sinus bradycardia. She has a PMH significant for HTN (she denies, but is reported in the medical chart) and diabetes, chronic heartburn and GERD, TIA, and fibromyalgia. She has had GI bleeding with ASA, and takes Plavix.  She has been experiencing intermittent chest pain for the past month, occurring with and without exertion. She has noticed it when walking to her mailbox. It is in the left precordium, and radiates down her left arm. It is associated with shortness of breath. She denies syncope, palpitations, and diaphoresis.  When she was evaluated in the ED, her daughter mentions that her HR stayed low, and even walking the patient didn't elevate it.   Allergies  Allergen Reactions  . Cefuroxime Axetil Nausea And Vomiting  . Codeine Nausea And Vomiting  . Diltiazem Nausea And Vomiting  . Morphine Other (See Comments)    "it will kill me"  . Ondansetron Hives  . Shellfish Allergy Other (See Comments)    Blood sugar drops  . Aspirin Other (See Comments)    Gi bleed   . Penicillins Rash    Current Outpatient Prescriptions  Medication Sig Dispense Refill  . albuterol (PROVENTIL HFA) 108 (90 BASE) MCG/ACT inhaler Inhale 2 puffs into the lungs every 6 (six) hours as needed for shortness of breath.       . ALPRAZolam (XANAX XR) 1 MG 24 hr tablet Take 0.5-1 mg by mouth  daily as needed (Anxiety).       . Calcium Carbonate-Vitamin D (CALCIUM + D PO) Take 1 tablet by mouth daily.       . clopidogrel (PLAVIX) 75 MG tablet Take 75 mg by mouth daily.        . Cyanocobalamin (VITAMIN B 12 PO) Take 2,000 mcg by mouth daily.      Marland Kitchen dexlansoprazole (DEXILANT) 60 MG capsule Take 1 capsule (60 mg total) by mouth daily.  90 capsule  3  . docusate sodium (COLACE) 100 MG capsule Take 100 mg by mouth as needed for constipation.      . enalapril (VASOTEC) 5 MG tablet Take 5 mg by mouth daily.      . famotidine (PEPCID) 20 MG tablet Take 2 tablets (40 mg total) by mouth at bedtime.  90 tablet  3  . fentaNYL (DURAGESIC - DOSED MCG/HR) 75 MCG/HR Place 1 patch onto the skin every 3 (three) days. Patient states that she is using 50 mcg patch a day      . metFORMIN (GLUCOPHAGE) 1000 MG tablet Take 1,000 mg by mouth daily.       Marland Kitchen MISC NATURAL PRODUCTS ER PO Take 1-3 capsules by mouth daily. Digestive Blend Softgels      . mometasone (NASONEX) 50 MCG/ACT nasal spray Place 2 sprays into the nose daily as needed (Allergies).       . Pediatric Multivitamins-Iron (FLINTSTONES PLUS IRON PO)  Take by mouth daily.      . polyethylene glycol powder (GLYCOLAX/MIRALAX) powder Take 17 g by mouth daily.      . promethazine (PHENERGAN) 12.5 MG tablet Take 1 tablet (12.5 mg total) by mouth every 8 (eight) hours as needed for nausea.  10 tablet  0  . silodosin (RAPAFLO) 8 MG CAPS capsule Take 8 mg by mouth daily with breakfast.      . temazepam (RESTORIL) 30 MG capsule Take 30 mg by mouth at bedtime as needed for sleep.      Marland Kitchen topiramate (TOPAMAX) 100 MG tablet Take 100 mg by mouth at bedtime.      . Travoprost, BAK Free, (TRAVATAN) 0.004 % SOLN ophthalmic solution Place 1 drop into both eyes at bedtime.       No current facility-administered medications for this visit.    Past Medical History  Diagnosis Date  . Nausea   . Bloating   . Dysphagia   . Gastroesophageal reflux   . Gastroparesis    . Anemia   . Diabetes mellitus   . TIA (transient ischemic attack)   . PONV (postoperative nausea and vomiting)   . Fibromyalgia   . Cancer   . Hypertension   . Renal insufficiency   . Chronic headaches   . Chronic neck pain   . Chronic back pain   . DDD (degenerative disc disease), lumbar   . Hiatal hernia   . Dizziness   . Chronic heartburn   . Chronic pain   . Anxiety     Past Surgical History  Procedure Laterality Date  . Hemicolectomy  ZIEGLER  . Colonoscopy  10/11/2010  . Colonoscopy  11/23/2009  . Colonoscopy  07/28/2008    W/SNARE  . Colonoscopy  06/28/07  . Colonoscopy  05/10/07    W/POLYP  . Colonoscopy  12/28/00  . Upper gastrointestinal endoscopy  10/11/2010    EGD ED  . Upper gastrointestinal endoscopy  11/23/2009  . Upper gastrointestinal endoscopy  05/10/07    EGD ED  . Upper gastrointestinal endoscopy  08/11/01    EGD ED  . Cervical spine surgery    . Cholecystectomy    . Hiatal hernia repair    . Abdominal hysterectomy      History   Social History  . Marital Status: Widowed    Spouse Name: N/A    Number of Children: N/A  . Years of Education: N/A   Occupational History  .     Social History Main Topics  . Smoking status: Former Smoker -- 1.50 packs/day    Types: Cigarettes    Quit date: 03/17/1991  . Smokeless tobacco: Never Used     Comment: Patient states that it has ben greater than 20 years since she quit smoking  . Alcohol Use: No  . Drug Use: No  . Sexual Activity: Not on file   Other Topics Concern  . Not on file   Social History Narrative   Patient lives at home alone.    Patient is retired.    Patient is widowed.    Patient has 4 children.   Patient has a 11th grade education.            Family History  Problem Relation Age of Onset  . Heart disease Mother   . Diabetes Mother   . Dementia Father   . Healthy Sister   . Diabetes Brother   . Kidney cancer Brother   . Diabetes Brother   .  Neuropathy  Brother   . Diabetes Daughter   . Diabetes Daughter   . Diabetes Son      Prior to Admission medications   Medication Sig Start Date End Date Taking? Authorizing Provider  albuterol (PROVENTIL HFA) 108 (90 BASE) MCG/ACT inhaler Inhale 2 puffs into the lungs every 6 (six) hours as needed for shortness of breath.    Yes Historical Provider, MD  ALPRAZolam (XANAX XR) 1 MG 24 hr tablet Take 0.5-1 mg by mouth daily as needed (Anxiety).    Yes Historical Provider, MD  Calcium Carbonate-Vitamin D (CALCIUM + D PO) Take 1 tablet by mouth daily.    Yes Historical Provider, MD  clopidogrel (PLAVIX) 75 MG tablet Take 75 mg by mouth daily.     Yes Historical Provider, MD  Cyanocobalamin (VITAMIN B 12 PO) Take 2,000 mcg by mouth daily.   Yes Historical Provider, MD  dexlansoprazole (DEXILANT) 60 MG capsule Take 1 capsule (60 mg total) by mouth daily. 07/20/12  Yes Malissa Hippo, MD  docusate sodium (COLACE) 100 MG capsule Take 100 mg by mouth as needed for constipation.   Yes Historical Provider, MD  enalapril (VASOTEC) 5 MG tablet Take 5 mg by mouth daily.   Yes Historical Provider, MD  famotidine (PEPCID) 20 MG tablet Take 2 tablets (40 mg total) by mouth at bedtime. 07/20/12  Yes Malissa Hippo, MD  fentaNYL (DURAGESIC - DOSED MCG/HR) 75 MCG/HR Place 1 patch onto the skin every 3 (three) days. Patient states that she is using 50 mcg patch a day   Yes Historical Provider, MD  metFORMIN (GLUCOPHAGE) 1000 MG tablet Take 1,000 mg by mouth daily.    Yes Historical Provider, MD  MISC NATURAL PRODUCTS ER PO Take 1-3 capsules by mouth daily. Digestive Blend Softgels   Yes Historical Provider, MD  mometasone (NASONEX) 50 MCG/ACT nasal spray Place 2 sprays into the nose daily as needed (Allergies).    Yes Historical Provider, MD  Pediatric Multivitamins-Iron (FLINTSTONES PLUS IRON PO) Take by mouth daily.   Yes Historical Provider, MD  polyethylene glycol powder (GLYCOLAX/MIRALAX) powder Take 17 g by mouth daily.    Yes Historical Provider, MD  promethazine (PHENERGAN) 12.5 MG tablet Take 1 tablet (12.5 mg total) by mouth every 8 (eight) hours as needed for nausea. 03/24/13  Yes Ward Givens, MD  silodosin (RAPAFLO) 8 MG CAPS capsule Take 8 mg by mouth daily with breakfast.   Yes Historical Provider, MD  temazepam (RESTORIL) 30 MG capsule Take 30 mg by mouth at bedtime as needed for sleep.   Yes Historical Provider, MD  topiramate (TOPAMAX) 100 MG tablet Take 100 mg by mouth at bedtime.   Yes Historical Provider, MD  Travoprost, BAK Free, (TRAVATAN) 0.004 % SOLN ophthalmic solution Place 1 drop into both eyes at bedtime.   Yes Historical Provider, MD     Review of systems complete and found to be negative unless listed above in HPI     Physical exam Blood pressure 127/66, pulse 60, height 5\' 3"  (1.6 m), weight 130 lb (58.968 kg). General: NAD Neck: No JVD, no thyromegaly or thyroid nodule.  Lungs: Clear to auscultation bilaterally with normal respiratory effort. CV: Nondisplaced PMI.  Heart regular S1/S2, no S3/S4, no murmur.  No peripheral edema.  No carotid bruit.  Normal pedal pulses.  Abdomen: Soft, nontender, no hepatosplenomegaly, no distention.  Skin: Intact without lesions or rashes.  Neurologic: Alert and oriented x 3.  Psych: Normal affect. Extremities: No  clubbing or cyanosis.  HEENT: Normal.   Labs:   Lab Results  Component Value Date   WBC 6.0 03/25/2013   HGB 11.6* 03/25/2013   HCT 35.7* 03/25/2013   MCV 91.3 03/25/2013   PLT 232 03/25/2013   No results found for this basename: NA, K, CL, CO2, BUN, CREATININE, CALCIUM, LABALBU, PROT, BILITOT, ALKPHOS, ALT, AST, GLUCOSE,  in the last 168 hours Lab Results  Component Value Date   TROPONINI <0.30 03/25/2013    No results found for this basename: CHOL   No results found for this basename: HDL   No results found for this basename: LDLCALC   No results found for this basename: TRIG   No results found for this basename:  CHOLHDL   No results found for this basename: LDLDIRECT       EKG: See HPI  Studies: See HPI  ASSESSMENT AND PLAN:  1. Chest pain: there were coronary artery calcifications seen on the three major coronary arteries, and her primary risk factor for CAD is diabetes. Her symptoms are concerning and for this reason, I will obtain an echocardiogram to evaluate systolic function and for any wall motion abnormalities, as well as a Lexiscan Cardiolite stress test to evaluate for inducible ischemia. 2. HTN: controlled. 3. Bradycardia: currently 60 bpm, but 46 bpm by ECG in ED. This may represent RCA disease and there were calcifications seen in this vascular territory. She may also have conduction system disease (chronotropic incompetence), as she is not on any rate-slowing medications. Will continue to monitor and evaluate with a stress test. She is unable to walk on a treadmill.  Signed: Prentice Docker, M.D., F.A.C.C.  04/06/2013, 2:50 PM

## 2013-04-06 NOTE — Patient Instructions (Addendum)
Your physician recommends that you schedule a follow-up appointment in: 3-4 weeks. Your physician recommends that you continue on your current medications as directed. Please refer to the Current Medication list given to you today. Your physician has requested that you have an echocardiogram. Echocardiography is a painless test that uses sound waves to create images of your heart. It provides your doctor with information about the size and shape of your heart and how well your heart's chambers and valves are working. This procedure takes approximately one hour. There are no restrictions for this procedure. Your physician has requested that you have a lexiscan myoview. For further information please visit [email or web address] Please follow instruction sheet, as given.

## 2013-04-13 HISTORY — PX: CATARACT EXTRACTION W/ INTRAOCULAR LENS IMPLANT: SHX1309

## 2013-04-19 ENCOUNTER — Ambulatory Visit (HOSPITAL_COMMUNITY)
Admission: RE | Admit: 2013-04-19 | Discharge: 2013-04-19 | Disposition: A | Discharge status: Home / Self Care | Payer: Medicare Other | Source: Ambulatory Visit | Attending: Cardiovascular Disease | Admitting: Cardiovascular Disease

## 2013-04-19 ENCOUNTER — Other Ambulatory Visit: Payer: Self-pay

## 2013-04-19 ENCOUNTER — Encounter (HOSPITAL_COMMUNITY)
Admission: RE | Admit: 2013-04-19 | Discharge: 2013-04-19 | Disposition: A | Discharge status: Home / Self Care | Payer: Medicare Other | Source: Ambulatory Visit | Attending: Cardiovascular Disease | Admitting: Cardiovascular Disease

## 2013-04-19 ENCOUNTER — Encounter (HOSPITAL_COMMUNITY): Payer: Self-pay | Admitting: Emergency Medicine

## 2013-04-19 ENCOUNTER — Inpatient Hospital Stay (HOSPITAL_COMMUNITY)
Admission: EM | Admit: 2013-04-19 | Discharge: 2013-04-20 | DRG: 287 | Disposition: A | Discharge status: Home / Self Care | Payer: Medicare Other | Attending: Cardiology | Admitting: Cardiology

## 2013-04-19 ENCOUNTER — Encounter (HOSPITAL_COMMUNITY)
Admission: EM | Disposition: A | Discharge status: Home / Self Care | Payer: Self-pay | Source: Home / Self Care | Attending: Cardiology

## 2013-04-19 ENCOUNTER — Encounter (HOSPITAL_COMMUNITY): Payer: Self-pay

## 2013-04-19 ENCOUNTER — Emergency Department (HOSPITAL_COMMUNITY): Payer: Medicare Other

## 2013-04-19 DIAGNOSIS — Z8249 Family history of ischemic heart disease and other diseases of the circulatory system: Secondary | ICD-10-CM

## 2013-04-19 DIAGNOSIS — I2510 Atherosclerotic heart disease of native coronary artery without angina pectoris: Secondary | ICD-10-CM

## 2013-04-19 DIAGNOSIS — D649 Anemia, unspecified: Secondary | ICD-10-CM | POA: Diagnosis present

## 2013-04-19 DIAGNOSIS — Z888 Allergy status to other drugs, medicaments and biological substances status: Secondary | ICD-10-CM

## 2013-04-19 DIAGNOSIS — E119 Type 2 diabetes mellitus without complications: Secondary | ICD-10-CM

## 2013-04-19 DIAGNOSIS — R079 Chest pain, unspecified: Secondary | ICD-10-CM

## 2013-04-19 DIAGNOSIS — K3184 Gastroparesis: Secondary | ICD-10-CM | POA: Diagnosis present

## 2013-04-19 DIAGNOSIS — E1149 Type 2 diabetes mellitus with other diabetic neurological complication: Secondary | ICD-10-CM | POA: Diagnosis present

## 2013-04-19 DIAGNOSIS — Z862 Personal history of diseases of the blood and blood-forming organs and certain disorders involving the immune mechanism: Secondary | ICD-10-CM

## 2013-04-19 DIAGNOSIS — Z820 Family history of epilepsy and other diseases of the nervous system: Secondary | ICD-10-CM

## 2013-04-19 DIAGNOSIS — Z8051 Family history of malignant neoplasm of kidney: Secondary | ICD-10-CM

## 2013-04-19 DIAGNOSIS — Z8673 Personal history of transient ischemic attack (TIA), and cerebral infarction without residual deficits: Secondary | ICD-10-CM

## 2013-04-19 DIAGNOSIS — I10 Essential (primary) hypertension: Secondary | ICD-10-CM | POA: Insufficient documentation

## 2013-04-19 DIAGNOSIS — E1143 Type 2 diabetes mellitus with diabetic autonomic (poly)neuropathy: Secondary | ICD-10-CM

## 2013-04-19 DIAGNOSIS — M5137 Other intervertebral disc degeneration, lumbosacral region: Secondary | ICD-10-CM | POA: Diagnosis present

## 2013-04-19 DIAGNOSIS — F411 Generalized anxiety disorder: Secondary | ICD-10-CM | POA: Diagnosis present

## 2013-04-19 DIAGNOSIS — Z886 Allergy status to analgesic agent status: Secondary | ICD-10-CM

## 2013-04-19 DIAGNOSIS — Z9089 Acquired absence of other organs: Secondary | ICD-10-CM

## 2013-04-19 DIAGNOSIS — Z79899 Other long term (current) drug therapy: Secondary | ICD-10-CM

## 2013-04-19 DIAGNOSIS — G8929 Other chronic pain: Secondary | ICD-10-CM | POA: Diagnosis present

## 2013-04-19 DIAGNOSIS — Z91013 Allergy to seafood: Secondary | ICD-10-CM

## 2013-04-19 DIAGNOSIS — M797 Fibromyalgia: Secondary | ICD-10-CM

## 2013-04-19 DIAGNOSIS — Z833 Family history of diabetes mellitus: Secondary | ICD-10-CM

## 2013-04-19 DIAGNOSIS — Z87891 Personal history of nicotine dependence: Secondary | ICD-10-CM

## 2013-04-19 DIAGNOSIS — K219 Gastro-esophageal reflux disease without esophagitis: Secondary | ICD-10-CM | POA: Diagnosis present

## 2013-04-19 DIAGNOSIS — M51379 Other intervertebral disc degeneration, lumbosacral region without mention of lumbar back pain or lower extremity pain: Secondary | ICD-10-CM | POA: Diagnosis present

## 2013-04-19 DIAGNOSIS — R001 Bradycardia, unspecified: Secondary | ICD-10-CM

## 2013-04-19 DIAGNOSIS — I200 Unstable angina: Secondary | ICD-10-CM | POA: Diagnosis present

## 2013-04-19 DIAGNOSIS — I498 Other specified cardiac arrhythmias: Secondary | ICD-10-CM | POA: Diagnosis present

## 2013-04-19 DIAGNOSIS — Z880 Allergy status to penicillin: Secondary | ICD-10-CM

## 2013-04-19 DIAGNOSIS — Z7902 Long term (current) use of antithrombotics/antiplatelets: Secondary | ICD-10-CM

## 2013-04-19 DIAGNOSIS — Z8719 Personal history of other diseases of the digestive system: Secondary | ICD-10-CM

## 2013-04-19 HISTORY — PX: LEFT HEART CATHETERIZATION WITH CORONARY ANGIOGRAM: SHX5451

## 2013-04-19 HISTORY — DX: Family history of other specified conditions: Z84.89

## 2013-04-19 HISTORY — DX: Unspecified glaucoma: H40.9

## 2013-04-19 HISTORY — DX: Type 2 diabetes mellitus with diabetic polyneuropathy: E11.42

## 2013-04-19 HISTORY — DX: Pneumonia, unspecified organism: J18.9

## 2013-04-19 HISTORY — DX: Personal history of other medical treatment: Z92.89

## 2013-04-19 HISTORY — DX: Malignant neoplasm of colon, unspecified: C18.9

## 2013-04-19 HISTORY — DX: Type 2 diabetes mellitus without complications: E11.9

## 2013-04-19 HISTORY — DX: Chronic or unspecified gastric ulcer with hemorrhage: K25.4

## 2013-04-19 HISTORY — DX: Migraine, unspecified, not intractable, without status migrainosus: G43.909

## 2013-04-19 HISTORY — DX: Unspecified osteoarthritis, unspecified site: M19.90

## 2013-04-19 LAB — CBC WITH DIFFERENTIAL/PLATELET
Basophils Absolute: 0 10*3/uL (ref 0.0–0.1)
Basophils Relative: 1 % (ref 0–1)
Eosinophils Absolute: 0.2 10*3/uL (ref 0.0–0.7)
Eosinophils Relative: 3 % (ref 0–5)
HCT: 32.8 % — ABNORMAL LOW (ref 36.0–46.0)
Hemoglobin: 10.7 g/dL — ABNORMAL LOW (ref 12.0–15.0)
Lymphocytes Relative: 43 % (ref 12–46)
Lymphs Abs: 2.3 10*3/uL (ref 0.7–4.0)
MCH: 29.6 pg (ref 26.0–34.0)
MCHC: 32.6 g/dL (ref 30.0–36.0)
MCV: 90.9 fL (ref 78.0–100.0)
Monocytes Absolute: 0.2 10*3/uL (ref 0.1–1.0)
Monocytes Relative: 4 % (ref 3–12)
Neutro Abs: 2.6 10*3/uL (ref 1.7–7.7)
Neutrophils Relative %: 49 % (ref 43–77)
Platelets: 215 10*3/uL (ref 150–400)
RBC: 3.61 MIL/uL — ABNORMAL LOW (ref 3.87–5.11)
RDW: 13.3 % (ref 11.5–15.5)
WBC: 5.4 10*3/uL (ref 4.0–10.5)

## 2013-04-19 LAB — GLUCOSE, CAPILLARY
Glucose-Capillary: 80 mg/dL (ref 70–99)
Glucose-Capillary: 79 mg/dL (ref 70–99)
Glucose-Capillary: 115 mg/dL — ABNORMAL HIGH (ref 70–99)

## 2013-04-19 LAB — BASIC METABOLIC PANEL
BUN: 6 mg/dL (ref 6–23)
CO2: 24 mEq/L (ref 19–32)
Calcium: 9.7 mg/dL (ref 8.4–10.5)
Chloride: 107 mEq/L (ref 96–112)
Creatinine, Ser: 0.79 mg/dL (ref 0.50–1.10)
GFR calc Af Amer: >90 mL/min (ref >90)
GFR calc non Af Amer: 82 mL/min — ABNORMAL LOW (ref >90)
Glucose, Bld: 119 mg/dL — ABNORMAL HIGH (ref 70–99)
Potassium: 3.5 mEq/L (ref 3.5–5.1)
Sodium: 141 mEq/L (ref 135–145)

## 2013-04-19 LAB — TROPONIN I: Troponin I: <0.3 ng/mL (ref <0.30)

## 2013-04-19 LAB — PROTIME-INR
INR: 1.03 (ref 0.00–1.49)
Prothrombin Time: 13.3 seconds (ref 11.6–15.2)

## 2013-04-19 SURGERY — LEFT HEART CATHETERIZATION WITH CORONARY ANGIOGRAM
Anesthesia: LOCAL

## 2013-04-19 MED ORDER — PROMETHAZINE HCL 25 MG PO TABS: 12.5000 mg | ORAL_TABLET | Freq: Three times a day (TID) | ORAL | Status: DC | PRN

## 2013-04-19 MED ORDER — FENTANYL CITRATE 0.05 MG/ML IJ SOLN
INTRAMUSCULAR | Status: AC
  Filled 2013-04-19: qty 2

## 2013-04-19 MED ORDER — POLYETHYLENE GLYCOL 3350 17 G PO PACK
17.0000 g | PACK | Freq: Every day | ORAL | Status: DC
  Administered 2013-04-19: 20:00:00 17 g via ORAL
  Filled 2013-04-19 (×2): qty 1

## 2013-04-19 MED ORDER — HEPARIN (PORCINE) IN NACL 2-0.9 UNIT/ML-% IJ SOLN
INTRAMUSCULAR | Status: AC
  Filled 2013-04-19: qty 1500

## 2013-04-19 MED ORDER — FAMOTIDINE 40 MG PO TABS
40.0000 mg | ORAL_TABLET | Freq: Every day | ORAL | Status: DC
  Administered 2013-04-19: 40 mg via ORAL
  Filled 2013-04-19 (×2): qty 1

## 2013-04-19 MED ORDER — ALPRAZOLAM ER 0.5 MG PO TB24: 0.5000 mg | ORAL_TABLET | Freq: Every evening | ORAL | Status: DC | PRN

## 2013-04-19 MED ORDER — ALPRAZOLAM 0.25 MG PO TABS
0.2500 mg | ORAL_TABLET | Freq: Once | ORAL | Status: AC
  Administered 2013-04-19: 0.25 mg via ORAL
  Filled 2013-04-19: qty 1

## 2013-04-19 MED ORDER — TOPIRAMATE 100 MG PO TABS
100.0000 mg | ORAL_TABLET | Freq: Every day | ORAL | Status: DC
  Administered 2013-04-19: 20:00:00 100 mg via ORAL
  Filled 2013-04-19 (×2): qty 1

## 2013-04-19 MED ORDER — ALBUTEROL SULFATE HFA 108 (90 BASE) MCG/ACT IN AERS
2.0000 | INHALATION_SPRAY | Freq: Four times a day (QID) | RESPIRATORY_TRACT | Status: DC | PRN
  Filled 2013-04-19: qty 6.7

## 2013-04-19 MED ORDER — POLYETHYLENE GLYCOL 3350 17 GM/SCOOP PO POWD
17.0000 g | Freq: Every day | ORAL | Status: DC
  Filled 2013-04-19: qty 255

## 2013-04-19 MED ORDER — ASPIRIN 81 MG PO CHEW: 81.0000 mg | CHEWABLE_TABLET | ORAL | Status: DC

## 2013-04-19 MED ORDER — REGADENOSON 0.4 MG/5ML IV SOLN
INTRAVENOUS | Status: AC
  Administered 2013-04-19: 0.4 mg via INTRAVENOUS
  Filled 2013-04-19: qty 5

## 2013-04-19 MED ORDER — ALPRAZOLAM 0.5 MG PO TABS: 0.5000 mg | ORAL_TABLET | Freq: Every evening | ORAL | Status: DC | PRN

## 2013-04-19 MED ORDER — NITROGLYCERIN 0.4 MG/SPRAY TL SOLN
Status: AC
  Filled 2013-04-19: qty 4.9

## 2013-04-19 MED ORDER — FENTANYL CITRATE 0.05 MG/ML IJ SOLN
50.0000 ug | Freq: Once | INTRAMUSCULAR | Status: AC
  Administered 2013-04-19: 50 ug via INTRAVENOUS
  Filled 2013-04-19: qty 2

## 2013-04-19 MED ORDER — CLOPIDOGREL BISULFATE 75 MG PO TABS
75.0000 mg | ORAL_TABLET | Freq: Every day | ORAL | Status: DC
  Administered 2013-04-19 – 2013-04-20 (×2): 75 mg via ORAL
  Filled 2013-04-19 (×2): qty 1

## 2013-04-19 MED ORDER — HEPARIN SODIUM (PORCINE) 5000 UNIT/ML IJ SOLN
60.0000 [IU]/kg | Freq: Once | INTRAMUSCULAR | Status: AC
  Administered 2013-04-19: 3500 [IU] via INTRAVENOUS
  Filled 2013-04-19: qty 1

## 2013-04-19 MED ORDER — MIDAZOLAM HCL 2 MG/2ML IJ SOLN
INTRAMUSCULAR | Status: AC
  Filled 2013-04-19: qty 2

## 2013-04-19 MED ORDER — ASPIRIN 325 MG PO TABS
325.0000 mg | ORAL_TABLET | Freq: Once | ORAL | Status: AC
  Administered 2013-04-19: 325 mg via ORAL
  Filled 2013-04-19: qty 1

## 2013-04-19 MED ORDER — TECHNETIUM TC 99M SESTAMIBI - CARDIOLITE
30.0000 | Freq: Once | INTRAVENOUS | Status: AC | PRN
  Administered 2013-04-19: 30 via INTRAVENOUS

## 2013-04-19 MED ORDER — LIDOCAINE HCL (PF) 1 % IJ SOLN
INTRAMUSCULAR | Status: AC
  Filled 2013-04-19: qty 30

## 2013-04-19 MED ORDER — NITROGLYCERIN 2 % TD OINT
1.0000 [in_us] | TOPICAL_OINTMENT | Freq: Once | TRANSDERMAL | Status: AC
  Administered 2013-04-19: 1 [in_us] via TOPICAL
  Filled 2013-04-19: qty 1

## 2013-04-19 MED ORDER — SODIUM CHLORIDE 0.9 % IJ SOLN
INTRAMUSCULAR | Status: AC
  Administered 2013-04-19: 10 mL via INTRAVENOUS
  Filled 2013-04-19: qty 10

## 2013-04-19 MED ORDER — LIVING WELL WITH DIABETES BOOK
Freq: Once | Status: AC
  Administered 2013-04-19: 22:00:00
  Filled 2013-04-19: qty 1

## 2013-04-19 MED ORDER — NITROGLYCERIN 0.2 MG/ML ON CALL CATH LAB
INTRAVENOUS | Status: AC
  Filled 2013-04-19: qty 1

## 2013-04-19 MED ORDER — DOCUSATE SODIUM 100 MG PO CAPS
100.0000 mg | ORAL_CAPSULE | ORAL | Status: DC | PRN
  Filled 2013-04-19: qty 1

## 2013-04-19 MED ORDER — HEPARIN SODIUM (PORCINE) 1000 UNIT/ML IJ SOLN
INTRAMUSCULAR | Status: AC
  Filled 2013-04-19: qty 1

## 2013-04-19 MED ORDER — FENTANYL 12 MCG/HR TD PT72: 50.0000 ug | MEDICATED_PATCH | TRANSDERMAL | Status: DC

## 2013-04-19 MED ORDER — SODIUM CHLORIDE 0.9 % IV SOLN: 1.0000 mL/kg/h | INTRAVENOUS | Status: AC

## 2013-04-19 MED ORDER — TECHNETIUM TC 99M SESTAMIBI - CARDIOLITE
10.0000 | Freq: Once | INTRAVENOUS | Status: AC | PRN
  Administered 2013-04-19: 10 via INTRAVENOUS

## 2013-04-19 MED ORDER — PANTOPRAZOLE SODIUM 40 MG PO TBEC
40.0000 mg | DELAYED_RELEASE_TABLET | Freq: Every day | ORAL | Status: DC
  Administered 2013-04-20: 40 mg via ORAL
  Filled 2013-04-19: qty 1

## 2013-04-19 MED ORDER — LATANOPROST 0.005 % OP SOLN
1.0000 [drp] | Freq: Every day | OPHTHALMIC | Status: DC
  Administered 2013-04-19: 20:00:00 1 [drp] via OPHTHALMIC
  Filled 2013-04-19: qty 2.5

## 2013-04-19 MED ORDER — HEPARIN (PORCINE) IN NACL 100-0.45 UNIT/ML-% IJ SOLN
700.0000 [IU]/h | INTRAMUSCULAR | Status: DC
  Administered 2013-04-19 (×2): 700 [IU]/h via INTRAVENOUS
  Filled 2013-04-19: qty 250

## 2013-04-19 MED ORDER — VERAPAMIL HCL 2.5 MG/ML IV SOLN
INTRAVENOUS | Status: AC
  Filled 2013-04-19: qty 2

## 2013-04-19 MED ORDER — NITROGLYCERIN 0.4 MG SL SUBL
SUBLINGUAL_TABLET | SUBLINGUAL | Status: AC
  Administered 2013-04-19: 0.4 mg via SUBLINGUAL
  Filled 2013-04-19: qty 25

## 2013-04-19 MED ORDER — ENALAPRIL MALEATE 5 MG PO TABS
5.0000 mg | ORAL_TABLET | Freq: Every day | ORAL | Status: DC
  Administered 2013-04-19 – 2013-04-20 (×2): 5 mg via ORAL
  Filled 2013-04-19 (×2): qty 1

## 2013-04-19 MED ORDER — PROMETHAZINE HCL 25 MG/ML IJ SOLN
12.5000 mg | Freq: Once | INTRAMUSCULAR | Status: AC
  Administered 2013-04-19: 12.5 mg via INTRAVENOUS
  Filled 2013-04-19: qty 1

## 2013-04-19 NOTE — ED Notes (Signed)
Report given to Tobi Bastos, Charity fundraiser. After report, Tobi Bastos informed me of bed assignment change.

## 2013-04-19 NOTE — Interval H&P Note (Signed)
History and Physical Interval Note:  04/19/2013 4:58 PM  [name]  has presented today for surgery, with the diagnosis of cp  The various methods of treatment have been discussed with the patient and family. After consideration of risks, benefits and other options for treatment, the patient has consented to  Procedure(s): LEFT HEART CATHETERIZATION WITH CORONARY ANGIOGRAM (N/A) as a surgical intervention .  The patient's history has been reviewed, patient examined, no change in status, stable for surgery.  I have reviewed the patient's chart and labs.  Questions were answered to the patient's satisfaction.   Cath Lab Visit (complete for each Cath Lab visit)  Clinical Evaluation Leading to the Procedure:   ACS: yes  Non-ACS:    Anginal Classification: CCS IV  Anti-ischemic medical therapy: No Therapy  Non-Invasive Test Results: High-risk stress test findings: cardiac mortality >3%/year  Prior CABG: No previous CABG        Theron Arista [facility] 04/19/2013 4:58 PM

## 2013-04-19 NOTE — Progress Notes (Signed)
ANTICOAGULATION CONSULT NOTE - Initial Consult  Pharmacy Consult for Heparin Indication: chest pain/ACS  Allergies  Allergen Reactions  . Cefuroxime Axetil Nausea And Vomiting  . Codeine Nausea And Vomiting  . Diltiazem Nausea And Vomiting  . Morphine Other (See Comments)    "it will kill me"  . Ondansetron Hives  . Shellfish Allergy Other (See Comments)    Blood sugar drops  . Aspirin Other (See Comments)    Gi bleed   . Penicillins Rash    Patient Measurements: Height: 5\' 1"  (154.9 cm) Weight: 128 lb (58.06 kg) IBW/kg (Calculated) : 47.8  Vital Signs: Temp: 98.4 F (36.9 C) (11/11 1039) Temp src: Oral (11/11 1039) BP: 146/65 mmHg (11/11 1039) Pulse Rate: 54 (11/11 1039)  Labs: No results found for this basename: HGB, HCT, PLT, APTT, LABPROT, INR, HEPARINUNFRC, CREATININE, CKTOTAL, CKMB, TROPONINI,  in the last 72 hours  Estimated Creatinine Clearance: 38.4 ml/min (by C-G formula based on Cr of 1.1).   Medical History: Past Medical History  Diagnosis Date  . Nausea   . Bloating   . Dysphagia   . Gastroesophageal reflux   . Gastroparesis   . Anemia   . Diabetes mellitus   . TIA (transient ischemic attack)   . PONV (postoperative nausea and vomiting)   . Fibromyalgia   . Cancer   . Hypertension   . Renal insufficiency   . Chronic headaches   . Chronic neck pain   . Chronic back pain   . DDD (degenerative disc disease), lumbar   . Hiatal hernia   . Dizziness   . Chronic heartburn   . Chronic pain   . Anxiety     Medications:  Scheduled:  . heparin  60 Units/kg Intravenous Once    Assessment: 72 yo F who developed chest pain & EKG changes during Lexiscan stress test. No history of bleeding noted. CBC pending.   Goal of Therapy:  Heparin level 0.3-0.7 units/ml Monitor platelets by anticoagulation protocol: Yes   Plan:  If platelet count >100K,  Give 3500 units bolus x 1 Start heparin infusion at 700 units/hr Check anti-Xa level in 6 hours  and daily while on heparin Continue to monitor H&H and platelets  Mikinzie Maciejewski, [facility] 04/19/2013,11:22 AM

## 2013-04-19 NOTE — Progress Notes (Signed)
HPI: [name] is a 72 y/o patient of Dr. Purvis Sheffield, seen in [place] for Lexiscan stress test after complaints of chest pain. She was seen by Dr. Zackery Barefoot on  04/06/2013 with recent CT scan revealing CAD affecting the Left Main, LAD ,CX and RCA. Other history includes diabetes, fibromyalgia and GERD. She has had GIB with plavix, but remains on this.   She has had her resting nuclear study, and was given Lexican injection. Approx one minute after injection, she began to have left sided chest pain, with diffuse EKG changes in the inferior, and precordial leads. She began to have pain in her left arm, heaviness and then tingling.    Dr. Wyline Mood was called over to review EKG and see patient as well. She was given 2 NTG SL with continued chest pain. She was taken to ER for further assessment and treatment with probable transfer to [facility] hospital.

## 2013-04-19 NOTE — ED Notes (Signed)
Patient ambulated to restroom with steady gait with cane as assistive device.

## 2013-04-19 NOTE — ED Notes (Signed)
[facility] left with Patient at this time. Patient in no distress.

## 2013-04-19 NOTE — H&P (Signed)
CARDIOLOGY HISTORY AND PHYSICAL   Patient ID: [name] MRN: [id number]  DOB/AGE: Oct 08, 1940 72 y.o. Admit date: 04/19/2013  Primary Care Physician: Milana Obey, MD Primary Cardiologist: Zackery Barefoot.[place].   Clinical Summary [name] is a 72 y.o.female. She has had significant chest pain. She has medical problems from the past. There is question of a GI bleed in the past while on aspirin. For this reason she takes Plavix. However I am not able to find the GI bleeding history. I am able to find a history that she had a hemicolectomy in the past for a colon lesion. She also has severe diabetic gastroparesis. This is followed by GI in [place].  The patient had a chest CT that showed significant coronary calcification. She has had chest discomfort and she was seen in the office in [place] for evaluation. Decision was made to proceed with a nuclear stress study. Today she began to get the stress medication and she had severe chest pain. It is my understanding that there were significant EKG changes. The study was stopped and she was sent to the emergency room. She has been receiving medications including nitroglycerin and heparin. Her pain is improved but not completely gone. She was sent for further evaluation. Here in the hospital she continues to have mild discomfort.  The patient also has bradycardia. This has been noted but she has not had hypotension. She was not able to walk on the treadmill for her stress test to see if she has chronotropic incompetence.   Allergies  Allergen Reactions  . Cefuroxime Axetil Nausea And Vomiting  . Codeine Nausea And Vomiting  . Diltiazem Nausea And Vomiting  . Morphine Other (See Comments)    "it will kill me"  . Ondansetron Hives  . Shellfish Allergy Other (See Comments)    Blood sugar drops  . Aspirin Other (See Comments)    Gi bleed   . Penicillins Rash    Home Medications Prescriptions prior to admission    Medication Sig Dispense Refill  . albuterol (PROVENTIL HFA) 108 (90 BASE) MCG/ACT inhaler Inhale 2 puffs into the lungs every 6 (six) hours as needed for shortness of breath.       . ALPRAZolam (XANAX XR) 1 MG 24 hr tablet Take 0.5-1 mg by mouth at bedtime as needed for sleep (Anxiety).       . Calcium Carbonate-Vitamin D (CALCIUM + D PO) Take 1 tablet by mouth daily.       . clopidogrel (PLAVIX) 75 MG tablet Take 75 mg by mouth daily.        . Cyanocobalamin (VITAMIN B 12 PO) Take 2,000 mcg by mouth daily.      Marland Kitchen dexlansoprazole (DEXILANT) 60 MG capsule Take 1 capsule (60 mg total) by mouth daily.  90 capsule  3  . docusate sodium (COLACE) 100 MG capsule Take 100 mg by mouth as needed for constipation.      . enalapril (VASOTEC) 5 MG tablet Take 5 mg by mouth daily.      . famotidine (PEPCID) 20 MG tablet Take 2 tablets (40 mg total) by mouth at bedtime.  90 tablet  3  . fentaNYL (DURAGESIC - DOSED MCG/HR) 50 MCG/HR Place 50 mcg onto the skin every 3 (three) days.      . metFORMIN (GLUCOPHAGE) 1000 MG tablet Take 1,000 mg by mouth daily.       Marland Kitchen MISC NATURAL PRODUCTS ER PO Take 1-3 capsules by mouth daily. Digestive Blend  Softgels      . mometasone (NASONEX) 50 MCG/ACT nasal spray Place 2 sprays into the nose daily as needed (Allergies).       . Pediatric Multivitamins-Iron (FLINTSTONES PLUS IRON PO) Take 1 tablet by mouth daily.       . polyethylene glycol powder (GLYCOLAX/MIRALAX) powder Take 17 g by mouth daily.      . promethazine (PHENERGAN) 12.5 MG tablet Take 1 tablet (12.5 mg total) by mouth every 8 (eight) hours as needed for nausea.  10 tablet  0  . silodosin (RAPAFLO) 8 MG CAPS capsule Take 8 mg by mouth daily with breakfast.      . temazepam (RESTORIL) 30 MG capsule Take 30 mg by mouth at bedtime as needed for sleep.      Marland Kitchen topiramate (TOPAMAX) 100 MG tablet Take 100 mg by mouth at bedtime.      . Travoprost, BAK Free, (TRAVATAN) 0.004 % SOLN ophthalmic solution Place 1 drop into  both eyes at bedtime.        Scheduled Medications . ALPRAZolam  0.25 mg Oral Once     Infusions . heparin 700 Units/hr (04/19/13 1135)     PRN Medications    Past Medical History  Diagnosis Date  . Nausea   . Bloating   . Dysphagia   . Gastroesophageal reflux   . Gastroparesis   . Anemia   . Diabetes mellitus   . TIA (transient ischemic attack)   . PONV (postoperative nausea and vomiting)   . Fibromyalgia   . Cancer   . Hypertension   . Renal insufficiency   . Chronic headaches   . Chronic neck pain   . Chronic back pain   . DDD (degenerative disc disease), lumbar   . Hiatal hernia   . Dizziness   . Chronic heartburn   . Chronic pain   . Anxiety     Past Surgical History  Procedure Laterality Date  . Hemicolectomy  ZIEGLER  . Colonoscopy  10/11/2010  . Colonoscopy  11/23/2009  . Colonoscopy  07/28/2008    W/SNARE  . Colonoscopy  06/28/07  . Colonoscopy  05/10/07    W/POLYP  . Colonoscopy  12/28/00  . Upper gastrointestinal endoscopy  10/11/2010    EGD ED  . Upper gastrointestinal endoscopy  11/23/2009  . Upper gastrointestinal endoscopy  05/10/07    EGD ED  . Upper gastrointestinal endoscopy  08/11/01    EGD ED  . Cervical spine surgery    . Cholecystectomy    . Hiatal hernia repair    . Abdominal hysterectomy      Family History  Problem Relation Age of Onset  . Heart disease Mother   . Diabetes Mother   . Dementia Father   . Healthy Sister   . Diabetes Brother   . Kidney cancer Brother   . Diabetes Brother   . Neuropathy Brother   . Diabetes Daughter   . Diabetes Daughter   . Diabetes Son     Social History [name] reports that she quit smoking about 22 years ago. Her smoking use included Cigarettes. She smoked 1.50 packs per day. She has never used smokeless tobacco. [name] reports that she does not drink alcohol.  Review of Systems Patient denies fever, chills, headache, sweats, rash, change in vision, change in  hearing, , cough, nausea vomiting, urinary symptoms. All other systems are reviewed and are negative.   Physical Examination The patient is here with a family member. She  is oriented to person time and place. Affect is normal. There is no jugulovenous distention. Lungs are clear. Respiratory effort is not labored. Cardiac exam reveals S1 and S2. There no clicks or significant murmurs. The abdomen is soft. There's no peripheral edema. There no musculoskeletal deformities. There are no skin rashes. .   Lab Results  Basic Metabolic Panel:  Recent Labs Lab 04/19/13 1101  NA 141  K 3.5  CL 107  CO2 24  GLUCOSE 119*  BUN 6  CREATININE 0.79  CALCIUM 9.7    Liver Function Tests: No results found for this basename: AST, ALT, ALKPHOS, BILITOT, PROT, ALBUMIN,  in the last 168 hours  CBC:  Recent Labs Lab 04/19/13 1101  WBC 5.4  NEUTROABS 2.6  HGB 10.7*  HCT 32.8*  MCV 90.9  PLT 215    Cardiac Enzymes:  Recent Labs Lab 04/19/13 1101  TROPONINI <0.30    BNP: No components found with this basename: POCBNP,    Radiology Dg Chest Portable 1 View  04/19/2013   CLINICAL DATA:  Chest pain during cardiac stress test  EXAM: PORTABLE CHEST - 1 VIEW  COMPARISON:  Prior chest x-ray and chest CT 03/25/2013  FINDINGS: The lungs are clear and negative for focal airspace consolidation, pulmonary edema or suspicious pulmonary nodule. No pleural effusion or pneumothorax. Cardiac and mediastinal contours are within normal limits. No acute fracture or lytic or blastic osseous lesions. Degenerative changes in the right glenohumeral joint. Incompletely imaged anterior cervical spine stabilization hardware. The visualized upper abdominal bowel gas pattern is unremarkable.  IMPRESSION: No active cardiopulmonary disease.   Electronically Signed   By: Malachy Moan M.D.   On: 04/19/2013 11:30    Prior Cardiac Testing/Procedures:   ECG   EKG available to me reveals nonspecific ST-T wave  changes. At this time I do not have the tracings of the abnormal EKG findings at the time of the infusion for her stress study.   Impression and Recommendations    Diabetic gastroparesis      This is a significant problem for her. She is followed carefully by Dr. Karilyn Cota in [place] for this.    Unstable angina     The patient had significant coronary calcification on her chest CT. She felt very poorly and had EKG changes when she received the stress agent for pharmacologic stress test today. She's having some residual pain. It is important that we proceed with cardiac catheterization.    History of GI bleed     I outlined in the lines above that I cannot document the exact GI bleeding in the past. I do not know if it was associated with her need for a hemicolectomy. In the recent notes from the GI doctor I do not see any evidence of GI bleeding. By history she is on Plavix instead of aspirin because of this. She does have severe gastroparesis.    Fibromyalgia    GERD (gastroesophageal reflux disease)    Coronary artery calcification seen on CAT scan    Bradycardia    The patient's heart rate is approximately 45. At this time the heart rate does not appear to be the basis of her chest discomfort. However we do not know if she has other symptoms related to bradycardia. She has not had syncope. We do not know if she gets an increase in heart rate when she tries to walk. Bradycardia will have to be assessed further after we learn more about her coronary disease.  Catheterization  is to be done today. Very careful attention will have to be paid to the choice of medications if she requires dual antiplatelet therapy for a possible PCI.  Signed: Willa Rough 04/19/2013, 2:59 PM

## 2013-04-19 NOTE — Progress Notes (Signed)
Stress Lab Nurses Notes - [name] 04/19/2013 Reason for doing test: Chest Pain Type of test: Marlane Hatcher Nurse performing test: Parke Poisson, RN Nuclear Medicine Tech: Lou Cal Echo Tech: Not Applicable MD performing test: Branch/K. Lawrence NP Family MD: Sudie Bailey Test explained and consent signed: yes IV started: 22g jelco, Saline lock flushed, No redness or edema and Saline lock started in radiology Symptoms: headache, chest pain & left arm pain with tingling Treatment/Intervention: NTG 0.4SL x 1 Reason test stopped: protocol completed After recovery IV was: No redness or edema and left intact Patient to return to Nuc. Med at : NA Patient discharged: NA Patient's Condition upon discharge was: Transferred to ED in stable condition via wheel chair. Comments: During test B/P 152/63 & HR 84.  Recovery BP 157/73 & HR 85.  Started having chest pain with EKG changes, Dr. Wyline Mood called in NTG given SL , transferred to ED. Erskine Speed T

## 2013-04-19 NOTE — ED Provider Notes (Signed)
CSN: [id number]     Arrival date & time 04/19/13  1035 History   First MD Initiated Contact with Patient 04/19/13 1048     Chief Complaint  Patient presents with  . Chest Pain   (Consider location/radiation/quality/duration/timing/severity/associated sxs/prior Treatment) HPI..... level V caveat for urgent need for intervention.   Patient transferred from [facility] cardiology clinic adjacent to the emergency department in [facility].   Patient was in the midst of a stress test when she experienced chest pain with radiation to both arms and neck, with associated nausea and dyspnea.  EKG showed diffuse ST segment depression. Nitroglycerin sublingual administered with slight decrease in symptoms. Patient transferred to the ER for more definitive care  Past Medical History  Diagnosis Date  . Nausea   . Bloating   . Dysphagia   . Gastroesophageal reflux   . Gastroparesis   . Anemia   . Diabetes mellitus   . TIA (transient ischemic attack)   . PONV (postoperative nausea and vomiting)   . Fibromyalgia   . Cancer   . Hypertension   . Renal insufficiency   . Chronic headaches   . Chronic neck pain   . Chronic back pain   . DDD (degenerative disc disease), lumbar   . Hiatal hernia   . Dizziness   . Chronic heartburn   . Chronic pain   . Anxiety    Past Surgical History  Procedure Laterality Date  . Hemicolectomy  ZIEGLER  . Colonoscopy  10/11/2010  . Colonoscopy  11/23/2009  . Colonoscopy  07/28/2008    W/SNARE  . Colonoscopy  06/28/07  . Colonoscopy  05/10/07    W/POLYP  . Colonoscopy  12/28/00  . Upper gastrointestinal endoscopy  10/11/2010    EGD ED  . Upper gastrointestinal endoscopy  11/23/2009  . Upper gastrointestinal endoscopy  05/10/07    EGD ED  . Upper gastrointestinal endoscopy  08/11/01    EGD ED  . Cervical spine surgery    . Cholecystectomy    . Hiatal hernia repair    . Abdominal hysterectomy     Family History  Problem Relation Age of  Onset  . Heart disease Mother   . Diabetes Mother   . Dementia Father   . Healthy Sister   . Diabetes Brother   . Kidney cancer Brother   . Diabetes Brother   . Neuropathy Brother   . Diabetes Daughter   . Diabetes Daughter   . Diabetes Son    History  Substance Use Topics  . Smoking status: Former Smoker -- 1.50 packs/day    Types: Cigarettes    Quit date: 03/17/1991  . Smokeless tobacco: Never Used     Comment: Patient states that it has ben greater than 20 years since she quit smoking  . Alcohol Use: No   OB History   Grav Para Term Preterm Abortions TAB SAB Ect Mult Living                 Review of Systems  Unable to perform ROS: Acuity of condition    Allergies  Cefuroxime axetil; Codeine; Diltiazem; Morphine; Ondansetron; Shellfish allergy; Aspirin; and Penicillins  Home Medications   Current Outpatient Rx  Name  Route  Sig  Dispense  Refill  . albuterol (PROVENTIL HFA) 108 (90 BASE) MCG/ACT inhaler   Inhalation   Inhale 2 puffs into the lungs every 6 (six) hours as needed for shortness of breath.          Marland Kitchen  ALPRAZolam (XANAX XR) 1 MG 24 hr tablet   Oral   Take 0.5-1 mg by mouth at bedtime as needed for sleep (Anxiety).          . Calcium Carbonate-Vitamin D (CALCIUM + D PO)   Oral   Take 1 tablet by mouth daily.          . clopidogrel (PLAVIX) 75 MG tablet   Oral   Take 75 mg by mouth daily.           . Cyanocobalamin (VITAMIN B 12 PO)   Oral   Take 2,000 mcg by mouth daily.         Marland Kitchen dexlansoprazole (DEXILANT) 60 MG capsule   Oral   Take 1 capsule (60 mg total) by mouth daily.   90 capsule   3   . docusate sodium (COLACE) 100 MG capsule   Oral   Take 100 mg by mouth as needed for constipation.         . enalapril (VASOTEC) 5 MG tablet   Oral   Take 5 mg by mouth daily.         . famotidine (PEPCID) 20 MG tablet   Oral   Take 2 tablets (40 mg total) by mouth at bedtime.   90 tablet   3   . fentaNYL (DURAGESIC - DOSED  MCG/HR) 50 MCG/HR   Transdermal   Place 50 mcg onto the skin every 3 (three) days.         . metFORMIN (GLUCOPHAGE) 1000 MG tablet   Oral   Take 1,000 mg by mouth daily.          Marland Kitchen MISC NATURAL PRODUCTS ER PO   Oral   Take 1-3 capsules by mouth daily. Digestive Blend Softgels         . mometasone (NASONEX) 50 MCG/ACT nasal spray   Nasal   Place 2 sprays into the nose daily as needed (Allergies).          . Pediatric Multivitamins-Iron (FLINTSTONES PLUS IRON PO)   Oral   Take 1 tablet by mouth daily.          . polyethylene glycol powder (GLYCOLAX/MIRALAX) powder   Oral   Take 17 g by mouth daily.         . promethazine (PHENERGAN) 12.5 MG tablet   Oral   Take 1 tablet (12.5 mg total) by mouth every 8 (eight) hours as needed for nausea.   10 tablet   0   . silodosin (RAPAFLO) 8 MG CAPS capsule   Oral   Take 8 mg by mouth daily with breakfast.         . temazepam (RESTORIL) 30 MG capsule   Oral   Take 30 mg by mouth at bedtime as needed for sleep.         Marland Kitchen topiramate (TOPAMAX) 100 MG tablet   Oral   Take 100 mg by mouth at bedtime.         . Travoprost, BAK Free, (TRAVATAN) 0.004 % SOLN ophthalmic solution   Both Eyes   Place 1 drop into both eyes at bedtime.          BP 146/65  Pulse 54  Temp(Src) 98.4 F (36.9 C) (Oral)  Ht 5\' 1"  (1.549 m)  Wt 128 lb (58.06 kg)  BMI 24.20 kg/m2  SpO2 99% Physical Exam  Nursing note and vitals reviewed. Constitutional: She is oriented to person, place, and time.  Pale, moderate  distress  HENT:  Head: Normocephalic and atraumatic.  Eyes: Conjunctivae and EOM are normal. Pupils are equal, round, and reactive to light.  Neck: Normal range of motion. Neck supple.  Cardiovascular: Normal rate, regular rhythm and normal heart sounds.   Pulmonary/Chest: Effort normal and breath sounds normal.  Abdominal: Soft. Bowel sounds are normal.  Musculoskeletal: Normal range of motion.  Neurological: She is alert  and oriented to person, place, and time.  Skin: Skin is warm and dry.  Psychiatric: She has a normal mood and affect.    ED Course  Procedures (including critical care time) Labs Review Labs Reviewed  CBC WITH DIFFERENTIAL  BASIC METABOLIC PANEL  TROPONIN I   Imaging Review No results found.  EKG Interpretation   None       Date: 04/19/2013  Rate: 59  Rhythm: sinus brady  QRS Axis: normal  Intervals: normal  ST/T Wave abnormalities: normal  Conduction Disutrbances: none  Narrative Interpretation: unremarkable  CRITICAL CARE Performed by: Donnetta Hutching Total critical care time: 30 Critical care time was exclusive of separately billable procedures and treating other patients. Critical care was necessary to treat or prevent imminent or life-threatening deterioration. Critical care was time spent personally by me on the following activities: development of treatment plan with patient and/or surrogate as well as nursing, discussions with consultants, evaluation of patient's response to treatment, examination of patient, obtaining history from patient or surrogate, ordering and performing treatments and interventions, ordering and review of laboratory studies, ordering and review of radiographic studies, pulse oximetry and re-evaluation of patient's condition.  MDM   1. Chest pain    Initial EKG reviewed. Patient had diffuse ST segment depression which has improved in emergency department. Additional NTG paste started. Aspirin 324 mg. Heparin bolus and drip initiated.   Discussed with Dr. Myrtis Ser in [place] who will accept transfer.    Donnetta Hutching, MD 04/19/13 [id number]

## 2013-04-19 NOTE — ED Notes (Signed)
Patient with c/o chest pain that radiates to back and bilateral arms. Patient alert/oriented x 4. Dusky skin color. Patient was at [facility] for stress test and had EKG changes during chemical stress test. Patient c/o nausea.

## 2013-04-19 NOTE — CV Procedure (Signed)
    Cardiac Catheterization Procedure Note  Name: [name] MRN: [id number] DOB: 04-13-1941  Procedure: Left Heart Cath, Selective Coronary Angiography, LV angiography  Indication: 72 yo WF with refractory chest pain. Lexiscan infusion resulted in severe chest pain with ST changes.   Procedural Details: The right wrist was prepped, draped, and anesthetized with 1% lidocaine. Using the modified Seldinger technique, a 5 French sheath was introduced into the right radial artery. 3 mg of verapamil was administered through the sheath, weight-based unfractionated heparin was administered intravenously. Standard Judkins catheters were used for selective coronary angiography and left ventriculography. Catheter exchanges were performed over an exchange length guidewire. There were no immediate procedural complications. A TR band was used for radial hemostasis at the completion of the procedure.  The patient was transferred to the post catheterization recovery area for further monitoring.  Procedural Findings: Hemodynamics: AO 122/53 mean 79 mm Hg LV 117/13 mm Hg  Coronary angiography: Coronary dominance: right  Left mainstem: Normal  Left anterior descending (LAD): 30% disease in the mid vessel. 40% distally.  Left circumflex (LCx): 30-40% distal OM1. Otherwise mild irregularities.  Right coronary artery (RCA): 20% disease proximally, 30% at the crux, 40-50% distally.  Left ventriculography: Left ventricular systolic function is normal, LVEF is estimated at 55-65%, there is no significant mitral regurgitation   Final Conclusions:   1. Nonobstructive CAD 2. Normal LV function  Recommendations: consider other causes of chest pain.  Theron Arista [facility] 04/19/2013, 5:24 PM

## 2013-04-20 LAB — CBC
HCT: 31 % — ABNORMAL LOW (ref 36.0–46.0)
Hemoglobin: 9.9 g/dL — ABNORMAL LOW (ref 12.0–15.0)
MCH: 29.2 pg (ref 26.0–34.0)
MCHC: 31.9 g/dL (ref 30.0–36.0)
MCV: 91.4 fL (ref 78.0–100.0)
Platelets: 209 10*3/uL (ref 150–400)
RBC: 3.39 MIL/uL — ABNORMAL LOW (ref 3.87–5.11)
RDW: 13.6 % (ref 11.5–15.5)
WBC: 5.5 10*3/uL (ref 4.0–10.5)

## 2013-04-20 LAB — GLUCOSE, CAPILLARY: Glucose-Capillary: 83 mg/dL (ref 70–99)

## 2013-04-20 MED ORDER — NITROGLYCERIN 0.4 MG SL SUBL
0.4000 mg | SUBLINGUAL_TABLET | SUBLINGUAL | Status: DC | PRN
  Administered 2013-04-20: 0.4 mg via SUBLINGUAL
  Filled 2013-04-20: qty 25

## 2013-04-20 MED ORDER — METFORMIN HCL 1000 MG PO TABS: 1000.0000 mg | ORAL_TABLET | Freq: Every day | ORAL | Status: DC

## 2013-04-20 MED ORDER — ACETAMINOPHEN 325 MG PO TABS
650.0000 mg | ORAL_TABLET | Freq: Four times a day (QID) | ORAL | Status: DC | PRN
  Administered 2013-04-20: 650 mg via ORAL
  Filled 2013-04-20: qty 2

## 2013-04-20 NOTE — Progress Notes (Signed)
At 01:50 AM pt c/o of dull chest pain about 2-3 pain score. V/S stable. EKG done - no changes noted. Dr. Terressa Koyanagi informed. Ntg SL 0.4 mg x 1  given as ordered with relief. Will continue to monitor pt.

## 2013-04-20 NOTE — Discharge Summary (Signed)
Patient seen and examined and history reviewed. Agree with above findings and plan. See my earlier rounding note.  Theron Arista JordanMD 04/20/2013 10:18 AM

## 2013-04-20 NOTE — Discharge Summary (Signed)
CARDIOLOGY DISCHARGE SUMMARY   Patient ID: [name] MRN: [id number] DOB/AGE: 09-18-1940 72 y.o.  Admit date: 04/19/2013 Discharge date: 04/20/2013  Primary Discharge Diagnosis:    Unstable angina  Secondary Discharge Diagnosis:    Diabetic gastroparesis   History of GI bleed   Fibromyalgia   GERD (gastroesophageal reflux disease)   Coronary artery calcification seen on CAT scan   Bradycardia  Consults: None  Procedures: Cardiac catheterization, coronary arteriogram, left ventriculogram  Hospital Course: [name] is a 72 y.o. female with no history of CAD. She had a chest CT last month that showed no coronary calcification. Because of the coronary calcification and repeated episodes of chest pain, she was seen in the [place] office. A Lexi scan nuclear stress test was planned. However, when she got a Lexi scan she had severe chest pain and ECG changes, see report below. She was transferred to [facility] and admitted for further evaluation and treatment.  Cardiac catheterization was performed to [facility], results are below. She had no significant CAD and her EF is preserved. Medical therapy was recommended with cardiac risk factor reduction and consideration of other causes of chest pain.  She was monitored and hydrated overnight. She was noted to have sinus bradycardia, heart rates in the 40s. There is concern for chronotropic incompetence, and she is to have a Holter monitor as an outpatient.  She is noted to have severe gastroparesis and anemia. She is followed by her primary M.D. for the anemia and by GI for the gastroparesis. No medication changes at this time.  On 04/20/2013, [name] was seen by Dr. [place]. He evaluated her and reviewed all data. He felt that since she had nonobstructive coronary artery disease by cath and no evidence of vasospasm despite active chest pain, no further cardiac workup was indicated. The CT of the chest done previously  was reviewed and was negative for acute pathology. Dr. [place] felt no further inpatient workup was indicated and [name] is considered stable for discharge, to follow up in [place].  Labs:  Lab Results  Component Value Date   WBC 5.5 04/20/2013   HGB 9.9* 04/20/2013   HCT 31.0* 04/20/2013   MCV 91.4 04/20/2013   PLT 209 04/20/2013     Recent Labs Lab 04/19/13 1101  NA 141  K 3.5  CL 107  CO2 24  BUN 6  CREATININE 0.79  CALCIUM 9.7  GLUCOSE 119*    Recent Labs  04/19/13 1101  TROPONINI <0.30    Recent Labs  04/19/13 1101  INR 1.03      Radiology:  Nm Myocar Single W/spect W/wall Motion And Ef 04/19/2013   CLINICAL DATA:  72 year old woman with history of hypertension, diabetes mellitus, and documentation of coronary artery calcifications by chest CT imaging. She is referred for the assessment of ischemia.  EXAM: MYOCARDIAL IMAGING WITH SPECT (REST ONLY)  TECHNIQUE: Standard myocardial SPECT imaging was performed after resting intravenous injection of 10 mCi Tc-2m sestamibi. Subsequently, intravenous infusion of Lexiscan was performed under the supervision of the Cardiology staff. At peak effect of the drug, 30 mCi Tc-85m sestamibi was injected intravenously. Due to the development of persistent chest pain and ST segment abnormalities, stress imaging was canceled by Dr. Wyline Mood, and the patient was sent to the ER for evaluation and hospitalization.  COMPARISON:  None.  FINDINGS: Baseline tracing shows sinus bradycardia at 47 beats per min with nonspecific ST changes. Patient received Lexiscan bolus in standard  fashion. Heart rate increased from 47 beats per min up to 92 beats per min, and blood pressure remained stable at 157/78. Patient reported chest pain with infusion and developed ST segment abnormalities, depression in leads II, III, AVF, and V3 through V6. These tracings were done at double standard calibration and ST segment depression ranged between 1 to 2 mm in  this situation, and were less impressive in the summary leads at standard calibration. No arrhythmias were noted.  Resting images were reviewed and showed small defects of mild intensity in the apex and apical inferolateral wall, uncertain significance. No stress images were obtained.  IMPRESSION: Nondiagnostic Lexiscan Cardiolite. Only rest images were obtained as detailed above showing small, mild intensity defects in the apical and apical inferolateral walls of uncertain significance. ST segment abnormalities were noted at double standard calibration, and the patient reported chest pain. Stress imaging was canceled and the patient was sent to the ER for evaluation by Dr. Wyline Mood.   Electronically Signed   By: Nona Dell M.D.   On: 04/19/2013 16:27   Dg Chest Portable 1 View 04/19/2013   CLINICAL DATA:  Chest pain during cardiac stress test  EXAM: PORTABLE CHEST - 1 VIEW  COMPARISON:  Prior chest x-ray and chest CT 03/25/2013  FINDINGS: The lungs are clear and negative for focal airspace consolidation, pulmonary edema or suspicious pulmonary nodule. No pleural effusion or pneumothorax. Cardiac and mediastinal contours are within normal limits. No acute fracture or lytic or blastic osseous lesions. Degenerative changes in the right glenohumeral joint. Incompletely imaged anterior cervical spine stabilization hardware. The visualized upper abdominal bowel gas pattern is unremarkable.  IMPRESSION: No active cardiopulmonary disease.   Electronically Signed   By: Malachy Moan M.D.   On: 04/19/2013 11:30    Cardiac Cath: 04/20/2003  Left mainstem: Normal  Left anterior descending (LAD): 30% disease in the mid vessel. 40% distally.  Left circumflex (LCx): 30-40% distal OM1. Otherwise mild irregularities.  Right coronary artery (RCA): 20% disease proximally, 30% at the crux, 40-50% distally.  Left ventriculography: Left ventricular systolic function is normal, LVEF is estimated at 55-65%, there is no  significant mitral regurgitation  Final Conclusions:  1. Nonobstructive CAD  2. Normal LV function  EKG: 04/20/2013 Sinus bradycardia Vent. rate 46 BPM PR interval 164 ms QRS duration 80 ms QT/QTc 506/442 ms P-R-T axes 65 -14 26  FOLLOW UP PLANS AND APPOINTMENTS Allergies  Allergen Reactions  . Cefuroxime Axetil Nausea And Vomiting  . Codeine Nausea And Vomiting  . Diltiazem Nausea And Vomiting  . Morphine Other (See Comments)    "it will kill me"  . Ondansetron Hives  . Shellfish Allergy Other (See Comments)    Blood sugar drops  . Aspirin Other (See Comments)    Gi bleed   . Penicillins Rash     Medication List         ALPRAZolam 1 MG 24 hr tablet  Commonly known as:  XANAX XR  Take 0.5-1 mg by mouth at bedtime as needed for sleep (Anxiety).     CALCIUM + D PO  Take 1 tablet by mouth daily.     clopidogrel 75 MG tablet  Commonly known as:  PLAVIX  Take 75 mg by mouth daily.     dexlansoprazole 60 MG capsule  Commonly known as:  DEXILANT  Take 1 capsule (60 mg total) by mouth daily.     docusate sodium 100 MG capsule  Commonly known as:  COLACE  Take  100 mg by mouth as needed for constipation.     enalapril 5 MG tablet  Commonly known as:  VASOTEC  Take 5 mg by mouth daily.     famotidine 20 MG tablet  Commonly known as:  PEPCID  Take 2 tablets (40 mg total) by mouth at bedtime.     fentaNYL 50 MCG/HR  Commonly known as:  DURAGESIC - dosed mcg/hr  Place 50 mcg onto the skin every 3 (three) days.     FLINTSTONES PLUS IRON PO  Take 1 tablet by mouth daily.     metFORMIN 1000 MG tablet  Commonly known as:  GLUCOPHAGE  Take 1 tablet (1,000 mg total) by mouth daily. HOLD for 2 days, resume on 04/22/2013.     MISC NATURAL PRODUCTS ER PO  Take 1-3 capsules by mouth daily. Digestive Blend Softgels     mometasone 50 MCG/ACT nasal spray  Commonly known as:  NASONEX  Place 2 sprays into the nose daily as needed (Allergies).     polyethylene glycol  powder powder  Commonly known as:  GLYCOLAX/MIRALAX  Take 17 g by mouth daily.     promethazine 12.5 MG tablet  Commonly known as:  PHENERGAN  Take 1 tablet (12.5 mg total) by mouth every 8 (eight) hours as needed for nausea.     PROVENTIL HFA 108 (90 BASE) MCG/ACT inhaler  Generic drug:  albuterol  Inhale 2 puffs into the lungs every 6 (six) hours as needed for shortness of breath.     silodosin 8 MG Caps capsule  Commonly known as:  RAPAFLO  Take 8 mg by mouth daily with breakfast.     temazepam 30 MG capsule  Commonly known as:  RESTORIL  Take 30 mg by mouth at bedtime as needed for sleep.     topiramate 100 MG tablet  Commonly known as:  TOPAMAX  Take 100 mg by mouth at bedtime.     Travoprost (BAK Free) 0.004 % Soln ophthalmic solution  Commonly known as:  TRAVATAN  Place 1 drop into both eyes at bedtime.     VITAMIN B 12 PO  Take 2,000 mcg by mouth daily.        Discharge Orders   Future Appointments Provider Department Dept Phone   04/21/2013 1:00 PM [facility] Stress Lab [facility] [phone]   04/25/2013 10:20 AM Laqueta Linden, MD [facility] [place] [phone]   05/02/2013 11:00 AM Laqueta Linden, MD [facility] [place] [phone]   06/20/2013 2:30 PM Malissa Hippo, MD [facility] [phone]   Future Orders Complete By Expires   Diet - low sodium heart healthy  As directed    Diet Carb Modified  As directed    Increase activity slowly  As directed      Follow-up Information   Follow up with [facility] On 04/21/2013. (Pick up monitor at 1:00 pm)    Contact information:   [address] [place] [address]       Follow up with Laqueta Linden, MD On 04/25/2013. (at 10:20 am)    Specialty:  Cardiology   Contact information:   [address] [place] [address] [phone]       BRING ALL MEDICATIONS WITH YOU TO FOLLOW UP  APPOINTMENTS  Time spent with patient to include physician time: 33 min Signed: Theodore Demark, PA-C 04/20/2013, 8:26 AM Co-Sign MD

## 2013-04-20 NOTE — Progress Notes (Signed)
TELEMETRY: Reviewed telemetry pt in sinus brady with HR in mid 40s: Filed Vitals:   04/19/13 2100 04/19/13 2200 04/20/13 0003 04/20/13 0414  BP: 134/63 126/59 127/53 131/86  Pulse: 49 45 44 44  Temp:   98 F (36.7 C) 98.1 F (36.7 C)  TempSrc:   Oral Oral  Resp: 18 18 18 20   Height:      Weight:   128 lb 8.5 oz (58.3 kg)   SpO2: 98% 96% 98% 100%    Intake/Output Summary (Last 24 hours) at 04/20/13 0730 Last data filed at 04/20/13 0100  Gross per 24 hour  Intake 679.55 ml  Output   1600 ml  Net -920.45 ml    SUBJECTIVE Still complains of intermittent chest pain. Not associated with activity, meals, movement. Sometimes worse with deep breath but not consistent. Complains of fatigue at times but not consistently.  LABS: Basic Metabolic Panel:  Recent Labs  16/10/96 1101  NA 141  K 3.5  CL 107  CO2 24  GLUCOSE 119*  BUN 6  CREATININE 0.79  CALCIUM 9.7   CBC:  Recent Labs  04/19/13 1101 04/20/13 0520  WBC 5.4 5.5  NEUTROABS 2.6  --   HGB 10.7* 9.9*  HCT 32.8* 31.0*  MCV 90.9 91.4  PLT 215 209   Cardiac Enzymes:  Recent Labs  04/19/13 1101  TROPONINI <0.30    Radiology/Studies:  Dg Chest 2 View  03/25/2013   CLINICAL DATA:  Chest pain. Dizziness.  EXAM: CHEST  2 VIEW  COMPARISON:  Multiple priors  FINDINGS: The cardiomediastinal silhouette is unchanged. No focal infiltrate or edema. No pleural effusion or pneumothorax. No acute osseous abnormality.  IMPRESSION: No active cardiopulmonary disease.   Electronically Signed   By: Jerene Dilling M.D.   On: 03/25/2013 16:04   Ct Angio Chest Pe W/cm &/or Wo Cm  03/25/2013   CLINICAL DATA:  Chest pain  EXAM: CT ANGIOGRAPHY CHEST WITH CONTRAST  TECHNIQUE: Multidetector CT imaging of the chest was performed using the standard protocol during bolus administration of intravenous contrast. Multiplanar CT image reconstructions including MIPs were obtained to evaluate the vascular anatomy.  CONTRAST:   OMNIPAQUE IOHEXOL 350 MG/ML SOLN  COMPARISON:  03/05/2010  FINDINGS: No filling defects in the pulmonary arterial tree to suggest acute pulmonary thromboembolism.  No evidence of aortic dissection. Prominent calcifications at the origin of the left subclavian artery. No obvious narrowing. Prominent calcifications at the origin of the left vertebral artery. Minimal calcification at the origin of the right vertebral artery.  Coronary artery calcifications effect left main, left anterior descending, circumflex, and right coronary arteries.  No pericardial effusion. No abnormal adenopathy.  Moderate-sized hiatal hernia.  No pneumothorax or pleural effusion  Tiny area of ground-glass in the right upper lobe on image 33. This is nonspecific and likely minimal focal pneumonitis.  No acute bony deformity. Minimal degenerative change in the thoracic spine.  Post cholecystectomy  Stable left adrenal pattern normal.  Review of the MIP images confirms the above findings.  IMPRESSION: No evidence of acute pulmonary thromboembolism.  Chronic findings are stable.   Electronically Signed   By: Maryclare Bean M.D.   On: 03/25/2013 16:08   Nm Myocar Single W/spect W/wall Motion And Ef  04/19/2013   CLINICAL DATA:  72 year old woman with history of hypertension, diabetes mellitus, and documentation of coronary artery calcifications by chest CT imaging. She is referred for the assessment of ischemia.  EXAM: MYOCARDIAL IMAGING WITH SPECT (REST  ONLY)  TECHNIQUE: Standard myocardial SPECT imaging was performed after resting intravenous injection of 10 mCi Tc-14m sestamibi. Subsequently, intravenous infusion of Lexiscan was performed under the supervision of the Cardiology staff. At peak effect of the drug, 30 mCi Tc-24m sestamibi was injected intravenously. Due to the development of persistent chest pain and ST segment abnormalities, stress imaging was canceled by Dr. Wyline Mood, and the patient was sent to the ER for evaluation and  hospitalization.  COMPARISON:  None.  FINDINGS: Baseline tracing shows sinus bradycardia at 47 beats per min with nonspecific ST changes. Patient received Lexiscan bolus in standard fashion. Heart rate increased from 47 beats per min up to 92 beats per min, and blood pressure remained stable at 157/78. Patient reported chest pain with infusion and developed ST segment abnormalities, depression in leads II, III, AVF, and V3 through V6. These tracings were done at double standard calibration and ST segment depression ranged between 1 to 2 mm in this situation, and were less impressive in the summary leads at standard calibration. No arrhythmias were noted.  Resting images were reviewed and showed small defects of mild intensity in the apex and apical inferolateral wall, uncertain significance. No stress images were obtained.  IMPRESSION: Nondiagnostic Lexiscan Cardiolite. Only rest images were obtained as detailed above showing small, mild intensity defects in the apical and apical inferolateral walls of uncertain significance. ST segment abnormalities were noted at double standard calibration, and the patient reported chest pain. Stress imaging was canceled and the patient was sent to the ER for evaluation by Dr. Wyline Mood.   Electronically Signed   By: Nona Dell M.D.   On: 04/19/2013 16:27   Dg Chest Portable 1 View  04/19/2013   CLINICAL DATA:  Chest pain during cardiac stress test  EXAM: PORTABLE CHEST - 1 VIEW  COMPARISON:  Prior chest x-ray and chest CT 03/25/2013  FINDINGS: The lungs are clear and negative for focal airspace consolidation, pulmonary edema or suspicious pulmonary nodule. No pleural effusion or pneumothorax. Cardiac and mediastinal contours are within normal limits. No acute fracture or lytic or blastic osseous lesions. Degenerative changes in the right glenohumeral joint. Incompletely imaged anterior cervical spine stabilization hardware. The visualized upper abdominal bowel gas pattern  is unremarkable.  IMPRESSION: No active cardiopulmonary disease.   Electronically Signed   By: Malachy Moan M.D.   On: 04/19/2013 11:30   Dg Chest Port 1 View  03/24/2013   CLINICAL DATA:  Chest pain.  EXAM: PORTABLE CHEST - 1 VIEW  COMPARISON:  05/03/2012  FINDINGS: The heart size and mediastinal contours are within normal limits. Both lungs are clear. The visualized skeletal structures are unremarkable. Soft tissue artifact simulates a right pneumothorax.  IMPRESSION: No active disease.   Electronically Signed   By: Geanie Cooley M.D.   On: 03/24/2013 12:18   Ecg: sinus brady without acute ST changes.  PHYSICAL EXAM General: Well developed, well nourished, in no acute distress. Head: Normocephalic, atraumatic, sclera non-icteric, no xanthomas, nares are without discharge. Neck: Negative for carotid bruits. JVD not elevated. Lungs: Clear bilaterally to auscultation without wheezes, rales, or rhonchi. Breathing is unlabored. Heart: RRR S1 S2 without murmurs, rubs, or gallops.  Abdomen: Soft, non-tender, non-distended with normoactive bowel sounds. No hepatomegaly. No rebound/guarding. No obvious abdominal masses. Msk:  Strength and tone appears normal for age. Extremities: No clubbing, cyanosis or edema.  Distal pedal pulses are 2+ and equal bilaterally. Neuro: Alert and oriented X 3. Moves all extremities spontaneously. Psych:  Responds  to questions appropriately with a normal affect.  ASSESSMENT AND PLAN: 1. Atypical chest pain. Etiology is unclear. Nonobstructive CAD by cath. No evidence of vasospasm with active chest pain in cath lab. Patient does have a history of fibromyalgia and severe gastroparesis. CT of chest negative for acute pathology. Will DC home today on prior meds.  2. Anemia. Primary MD is working up 3. Severe gastroparesis 4. Sinus bradycardia. Probable chronotropic incompetence. Unable to walk adequately on treadmill. Will arrange 24 hour Holter monitor. May need  pacemaker. Follow up in [place] office in 2 weeks. 5. DM. 6. Fibromyalgia.  Active Problems:   Diabetic gastroparesis   Unstable angina   History of GI bleed   Fibromyalgia   GERD (gastroesophageal reflux disease)   Coronary artery calcification seen on CAT scan   Bradycardia    Signed, Rogue Rafalski [place] MD,FACC 04/20/2013 7:36 AM

## 2013-04-20 NOTE — Progress Notes (Signed)
TR BAND REMOVAL  LOCATION:  right radial  DEFLATED PER PROTOCOL:  yes  TIME BAND OFF / DRESSING APPLIED:   2200   SITE UPON ARRIVAL:   Level 0  SITE AFTER BAND REMOVAL:  Level 0  REVERSE ALLEN'S TEST:    positive  CIRCULATION SENSATION AND MOVEMENT:  Within Normal Limits  yes  COMMENTS:     

## 2013-04-21 ENCOUNTER — Ambulatory Visit (HOSPITAL_COMMUNITY)
Admit: 2013-04-21 | Discharge: 2013-04-21 | Disposition: A | Discharge status: Home / Self Care | Payer: Medicare Other | Source: Ambulatory Visit | Attending: Cardiovascular Disease | Admitting: Cardiovascular Disease

## 2013-04-21 DIAGNOSIS — R079 Chest pain, unspecified: Secondary | ICD-10-CM

## 2013-04-21 NOTE — Progress Notes (Signed)
24 hour Holter Monitor in progress.  Ends  04-22-13 @ 1:40 pm

## 2013-04-25 ENCOUNTER — Encounter: Payer: Self-pay | Admitting: Cardiovascular Disease

## 2013-04-25 ENCOUNTER — Telehealth: Payer: Self-pay | Admitting: *Deleted

## 2013-04-25 ENCOUNTER — Other Ambulatory Visit: Payer: Self-pay | Admitting: *Deleted

## 2013-04-25 ENCOUNTER — Ambulatory Visit (INDEPENDENT_AMBULATORY_CARE_PROVIDER_SITE_OTHER): Payer: Medicare Other | Admitting: Cardiovascular Disease

## 2013-04-25 VITALS — BP 104/60 | HR 68 | Ht 61.0 in | Wt 128.0 lb

## 2013-04-25 DIAGNOSIS — R079 Chest pain, unspecified: Secondary | ICD-10-CM

## 2013-04-25 DIAGNOSIS — R001 Bradycardia, unspecified: Secondary | ICD-10-CM

## 2013-04-25 DIAGNOSIS — R42 Dizziness and giddiness: Secondary | ICD-10-CM

## 2013-04-25 DIAGNOSIS — I2510 Atherosclerotic heart disease of native coronary artery without angina pectoris: Secondary | ICD-10-CM

## 2013-04-25 DIAGNOSIS — I498 Other specified cardiac arrhythmias: Secondary | ICD-10-CM

## 2013-04-25 MED ORDER — RANOLAZINE ER 500 MG PO TB12: 500.0000 mg | ORAL_TABLET | Freq: Two times a day (BID) | ORAL | Status: DC

## 2013-04-25 NOTE — Addendum Note (Signed)
Addended by: Marlyn Corporal A on: 04/25/2013 02:25 PM   Modules accepted: Orders

## 2013-04-25 NOTE — Progress Notes (Signed)
I agree with documentation above, patient seen and discussed with NP Lyman Bishop. Following lexiscan injection patient with persistent chest pain and diffuse ST depressions on EKG that did not resolve with sublingual nitroglycerin. She was sent to the ER for further evaluation based on her persistent pain and EKG changes. She will require an invasive definitive ischemic evaluation, we will cancel the stress portion of her stress test in order to expedite her ER management and transfer for [facility] for a heart cath.    Dina Rich MD

## 2013-04-25 NOTE — Telephone Encounter (Signed)
Called pt to let her know that her holter monitor results come back normal per Dr Purvis Sheffield. Pt understood.

## 2013-04-25 NOTE — Patient Instructions (Signed)
Your physician recommends that you schedule a follow-up appointment in: 3 months  START Ranexa 500 mg twice daily   We will call you with monitor results

## 2013-04-25 NOTE — Progress Notes (Signed)
Patient ID: [name], female   DOB: 08-05-1940, 72 y.o.   MRN: [id number]      SUBJECTIVE: The patient is a 72 year old female whom I am seeing in followup. When I first saw her I had planned to proceed with cardiovascular testing for the evaluation of chest pain. During her nuclear stress test, she was noted to have chest pain and ischemic ECG changes and was thus transferred to [facility] for cardiac catheterization. Her coronary angiography demonstrated mild nonobstructive CAD. Her heart rate was noted to be in the 40s and she was scheduled to undergo Holter monitoring. She does have a history of severe gastroparesis. A chest CT was also done during her hospitalization which showed no evidence of acute pulmonary embolism. Left ventriculography demonstrated normal left ventricular systolic function.  Her HR is currently 68 bpm. She continues to experience episodic chest pain which radiates down her left arm. It is 6/10 when it is greatest in severity.    Allergies  Allergen Reactions  . Cefuroxime Axetil Nausea And Vomiting  . Codeine Nausea And Vomiting  . Diltiazem Nausea And Vomiting  . Morphine Other (See Comments)    "it will kill me"  . Ondansetron Hives  . Shellfish Allergy Other (See Comments)    Blood sugar drops  . Aspirin Other (See Comments)    Gi bleed   . Penicillins Rash    Current Outpatient Prescriptions  Medication Sig Dispense Refill  . albuterol (PROVENTIL HFA) 108 (90 BASE) MCG/ACT inhaler Inhale 2 puffs into the lungs every 6 (six) hours as needed for shortness of breath.       . ALPRAZolam (XANAX XR) 1 MG 24 hr tablet Take 0.5-1 mg by mouth at bedtime as needed for sleep (Anxiety).       . Calcium Carbonate-Vitamin D (CALCIUM + D PO) Take 1 tablet by mouth daily.       . clopidogrel (PLAVIX) 75 MG tablet Take 75 mg by mouth daily.        . Cyanocobalamin (VITAMIN B 12 PO) Take 2,000 mcg by mouth daily.      Marland Kitchen dexlansoprazole (DEXILANT) 60 MG  capsule Take 1 capsule (60 mg total) by mouth daily.  90 capsule  3  . docusate sodium (COLACE) 100 MG capsule Take 100 mg by mouth as needed for constipation.      . enalapril (VASOTEC) 5 MG tablet Take 5 mg by mouth daily.      . famotidine (PEPCID) 20 MG tablet Take 2 tablets (40 mg total) by mouth at bedtime.  90 tablet  3  . fentaNYL (DURAGESIC - DOSED MCG/HR) 50 MCG/HR Place 50 mcg onto the skin every 3 (three) days.      . metFORMIN (GLUCOPHAGE) 1000 MG tablet Take 1 tablet (1,000 mg total) by mouth daily. HOLD for 2 days, resume on 04/22/2013.  30 tablet  1  . MISC NATURAL PRODUCTS ER PO Take 1-3 capsules by mouth daily. Digestive Blend Softgels      . mometasone (NASONEX) 50 MCG/ACT nasal spray Place 2 sprays into the nose daily as needed (Allergies).       . Pediatric Multivitamins-Iron (FLINTSTONES PLUS IRON PO) Take 1 tablet by mouth daily.       . polyethylene glycol powder (GLYCOLAX/MIRALAX) powder Take 17 g by mouth daily.      . promethazine (PHENERGAN) 12.5 MG tablet Take 1 tablet (12.5 mg total) by mouth every 8 (eight) hours as needed for nausea.  10 tablet  0  . silodosin (RAPAFLO) 8 MG CAPS capsule Take 8 mg by mouth daily with breakfast.      . temazepam (RESTORIL) 30 MG capsule Take 30 mg by mouth at bedtime as needed for sleep.      Marland Kitchen topiramate (TOPAMAX) 100 MG tablet Take 100 mg by mouth at bedtime.      . Travoprost, BAK Free, (TRAVATAN) 0.004 % SOLN ophthalmic solution Place 1 drop into both eyes at bedtime.       No current facility-administered medications for this visit.    Past Medical History  Diagnosis Date  . Nausea   . Bloating   . Dysphagia   . Gastroesophageal reflux   . Gastroparesis   . Anemia   . TIA (transient ischemic attack)     "3-4 before starting RX; none since" (04/19/2013)  . PONV (postoperative nausea and vomiting)   . Fibromyalgia   . Renal insufficiency   . Chronic neck pain   . Dizziness   . Chronic heartburn   . Chronic pain     . Anxiety   . Family history of anesthesia complication     "daughter has PONV too" (04/19/2013)  . Hypertension     "for diabetes; don't have high blood pressure" (04/19/2013)  . Type II diabetes mellitus   . Bleeding stomach ulcer 1980's  . History of blood transfusion 1980's    "w/bleeding stomach ulcer" (04/19/2013)  . Hiatal hernia     "had it before; had OR; got it again" (04/19/2013)  . Chronic headaches   . Migraine     "take RX for it qd" (04/19/2013)  . DDD (degenerative disc disease), lumbar   . Arthritis     "all over" (04/19/2013)  . Chronic back pain   . Colon cancer   . Diabetic peripheral neuropathy     "in my feet" (04/19/2013)  . Walking pneumonia ~ 1966  . Pneumonia 04/2012  . Glaucoma, bilateral     Past Surgical History  Procedure Laterality Date  . Hemicolectomy  2010    ZIEGLER  . Colonoscopy  10/11/2010  . Colonoscopy  11/23/2009  . Colonoscopy  07/28/2008    W/SNARE  . Colonoscopy  06/28/07  . Colonoscopy  05/10/07    W/POLYP  . Colonoscopy  12/28/00  . Upper gastrointestinal endoscopy  10/11/2010    EGD ED  . Upper gastrointestinal endoscopy  11/23/2009  . Upper gastrointestinal endoscopy  05/10/07    EGD ED  . Upper gastrointestinal endoscopy  08/11/01    EGD ED  . Posterior fusion cervical spine  1985    "had a broken neck" (04/19/2013)  . Hiatal hernia repair  1970's  . Tonsillectomy  ~ 1953  . Colon surgery    . Cholecystectomy  1980's  . Anterior cervical discectomy  ~ 2009    "only cleaned out arthritis and spurs" (04/19/2013)  . Vaginal hysterectomy    . Cataract extraction w/ intraocular lens implant Left 04/13/2013  . Abdominal adhesion surgery  ~ 2012    "repaired wrap where they did hiatal hernia OR too" 911/04/2013)    History   Social History  . Marital Status: Widowed    Spouse Name: N/A    Number of Children: N/A  . Years of Education: N/A   Occupational History  .     Social History Main Topics  .  Smoking status: Former Smoker -- 1.50 packs/day for 15 years    Types: Cigarettes  Quit date: 03/17/1991  . Smokeless tobacco: Never Used     Comment: Patient states that it has ben greater than 20 years since she quit smoking  . Alcohol Use: No  . Drug Use: No  . Sexual Activity: No   Other Topics Concern  . Not on file   Social History Narrative   Patient lives at home alone.    Patient is retired.    Patient is widowed.    Patient has 4 children.   Patient has a 11th grade education.            BP 104/60 Pulse 68   PHYSICAL EXAM General: NAD Neck: No JVD, no thyromegaly or thyroid nodule.  Lungs: Clear to auscultation bilaterally with normal respiratory effort. CV: Nondisplaced PMI.  Heart regular S1/S2, no S3/S4, no murmur.  No peripheral edema.  No carotid bruit.  Normal pedal pulses.  Abdomen: Soft, nontender, no hepatosplenomegaly, no distention.  Neurologic: Alert and oriented x 3.  Psych: Normal affect. Extremities: No clubbing or cyanosis.   ECG: reviewed and available in electronic records.  Radiology:  Nm Myocar Single W/spect W/wall Motion And Ef  04/19/2013 CLINICAL DATA: 72 year old woman with history of hypertension, diabetes mellitus, and documentation of coronary artery calcifications by chest CT imaging. She is referred for the assessment of ischemia. EXAM: MYOCARDIAL IMAGING WITH SPECT (REST ONLY) TECHNIQUE: Standard myocardial SPECT imaging was performed after resting intravenous injection of 10 mCi Tc-45m sestamibi. Subsequently, intravenous infusion of Lexiscan was performed under the supervision of the Cardiology staff. At peak effect of the drug, 30 mCi Tc-9m sestamibi was injected intravenously. Due to the development of persistent chest pain and ST segment abnormalities, stress imaging was canceled by Dr. Wyline Mood, and the patient was sent to the ER for evaluation and hospitalization. COMPARISON: None. FINDINGS: Baseline tracing shows sinus  bradycardia at 47 beats per min with nonspecific ST changes. Patient received Lexiscan bolus in standard fashion. Heart rate increased from 47 beats per min up to 92 beats per min, and blood pressure remained stable at 157/78. Patient reported chest pain with infusion and developed ST segment abnormalities, depression in leads II, III, AVF, and V3 through V6. These tracings were done at double standard calibration and ST segment depression ranged between 1 to 2 mm in this situation, and were less impressive in the summary leads at standard calibration. No arrhythmias were noted. Resting images were reviewed and showed small defects of mild intensity in the apex and apical inferolateral wall, uncertain significance. No stress images were obtained. IMPRESSION: Nondiagnostic Lexiscan Cardiolite. Only rest images were obtained as detailed above showing small, mild intensity defects in the apical and apical inferolateral walls of uncertain significance. ST segment abnormalities were noted at double standard calibration, and the patient reported chest pain. Stress imaging was canceled and the patient was sent to the ER for evaluation by Dr. Wyline Mood. Electronically Signed By: Nona Dell M.D. On: 04/19/2013 16:27    Dg Chest Portable 1 View  04/19/2013 CLINICAL DATA: Chest pain during cardiac stress test EXAM: PORTABLE CHEST - 1 VIEW COMPARISON: Prior chest x-ray and chest CT 03/25/2013 FINDINGS: The lungs are clear and negative for focal airspace consolidation, pulmonary edema or suspicious pulmonary nodule. No pleural effusion or pneumothorax. Cardiac and mediastinal contours are within normal limits. No acute fracture or lytic or blastic osseous lesions. Degenerative changes in the right glenohumeral joint. Incompletely imaged anterior cervical spine stabilization hardware. The visualized upper abdominal bowel gas pattern is unremarkable. IMPRESSION:  No active cardiopulmonary disease. Electronically Signed By:  Malachy Moan M.D. On: 04/19/2013 11:30    Cardiac Cath: 04/20/2003  Left mainstem: Normal  Left anterior descending (LAD): 30% disease in the mid vessel. 40% distally.  Left circumflex (LCx): 30-40% distal OM1. Otherwise mild irregularities.  Right coronary artery (RCA): 20% disease proximally, 30% at the crux, 40-50% distally.  Left ventriculography: Left ventricular systolic function is normal, LVEF is estimated at 55-65%, there is no significant mitral regurgitation  Final Conclusions:  1. Nonobstructive CAD  2. Normal LV function     ASSESSMENT AND PLAN: 1. Chest pain: as stated previously, this is not due to hemodynamically significant obstructive CAD. It is unclear what the etiology is, as she has had numerous tests which have been unrevealing. She appears to have a chest pain syndrome of which the etiologies include small vessel ischemic disease vs esophageal disease/spasm. I will prescribe Ranexa 500 mg bid, as the nitrates she took during her hospitalization did not alleviate her symptoms. 2. Bradycardia/chronotropic incompetence: will await the results of her Holter monitor. The patient does have episodic dizziness but denies syncope, and has a longstanding history of vertigo. Given her known h/o severe gastroparesis, she may very well have an autonomic neuropathy.  Dispo: f/u 3 months.    Prentice Docker, M.D., F.A.C.C.

## 2013-04-26 ENCOUNTER — Telehealth (INDEPENDENT_AMBULATORY_CARE_PROVIDER_SITE_OTHER): Payer: Self-pay | Admitting: *Deleted

## 2013-04-26 NOTE — Telephone Encounter (Signed)
Patient called and ask if we had rec'd the lab work from [facility]. That it had showed that she was losing blood somewhere. Dr.Rehman ask, he has reviewed the lab work and ask that the patient be posted for both a Colonoscopy and EGD. Forwarded to [place] to arrange and contact the patient.

## 2013-04-27 ENCOUNTER — Other Ambulatory Visit (INDEPENDENT_AMBULATORY_CARE_PROVIDER_SITE_OTHER): Payer: Self-pay | Admitting: *Deleted

## 2013-04-27 ENCOUNTER — Telehealth (INDEPENDENT_AMBULATORY_CARE_PROVIDER_SITE_OTHER): Payer: Self-pay | Admitting: *Deleted

## 2013-04-27 DIAGNOSIS — K922 Gastrointestinal hemorrhage, unspecified: Secondary | ICD-10-CM

## 2013-04-27 DIAGNOSIS — Z1211 Encounter for screening for malignant neoplasm of colon: Secondary | ICD-10-CM

## 2013-04-27 MED ORDER — PEG 3350-KCL-NA BICARB-NACL 420 G PO SOLR: 4000.0000 mL | Freq: Once | ORAL | Status: DC

## 2013-04-27 NOTE — Telephone Encounter (Signed)
Patient needs trilyte 

## 2013-04-27 NOTE — Telephone Encounter (Signed)
Tcs/egd sch'd 05/13/13, patient aware

## 2013-04-29 ENCOUNTER — Encounter (HOSPITAL_COMMUNITY): Payer: Self-pay | Admitting: Pharmacy Technician

## 2013-05-02 ENCOUNTER — Ambulatory Visit: Payer: Medicare Other | Admitting: Cardiovascular Disease

## 2013-05-13 ENCOUNTER — Ambulatory Visit (HOSPITAL_COMMUNITY)
Admission: RE | Admit: 2013-05-13 | Discharge: 2013-05-13 | Disposition: A | Discharge status: Home / Self Care | Payer: Medicare Other | Source: Ambulatory Visit | Attending: Internal Medicine | Admitting: Internal Medicine

## 2013-05-13 ENCOUNTER — Encounter (HOSPITAL_COMMUNITY)
Admission: RE | Disposition: A | Discharge status: Home / Self Care | Payer: Self-pay | Source: Ambulatory Visit | Attending: Internal Medicine

## 2013-05-13 DIAGNOSIS — E119 Type 2 diabetes mellitus without complications: Secondary | ICD-10-CM | POA: Insufficient documentation

## 2013-05-13 DIAGNOSIS — A048 Other specified bacterial intestinal infections: Secondary | ICD-10-CM | POA: Insufficient documentation

## 2013-05-13 DIAGNOSIS — R112 Nausea with vomiting, unspecified: Secondary | ICD-10-CM

## 2013-05-13 DIAGNOSIS — K2970 Gastritis, unspecified, without bleeding: Secondary | ICD-10-CM

## 2013-05-13 DIAGNOSIS — Z85038 Personal history of other malignant neoplasm of large intestine: Secondary | ICD-10-CM

## 2013-05-13 DIAGNOSIS — Z01812 Encounter for preprocedural laboratory examination: Secondary | ICD-10-CM | POA: Insufficient documentation

## 2013-05-13 DIAGNOSIS — K5730 Diverticulosis of large intestine without perforation or abscess without bleeding: Secondary | ICD-10-CM | POA: Insufficient documentation

## 2013-05-13 DIAGNOSIS — K2990 Gastroduodenitis, unspecified, without bleeding: Secondary | ICD-10-CM

## 2013-05-13 DIAGNOSIS — K922 Gastrointestinal hemorrhage, unspecified: Secondary | ICD-10-CM

## 2013-05-13 DIAGNOSIS — D509 Iron deficiency anemia, unspecified: Secondary | ICD-10-CM

## 2013-05-13 DIAGNOSIS — I10 Essential (primary) hypertension: Secondary | ICD-10-CM | POA: Insufficient documentation

## 2013-05-13 HISTORY — PX: COLONOSCOPY WITH ESOPHAGOGASTRODUODENOSCOPY (EGD): SHX5779

## 2013-05-13 LAB — GLUCOSE, CAPILLARY: Glucose-Capillary: 97 mg/dL (ref 70–99)

## 2013-05-13 SURGERY — COLONOSCOPY WITH ESOPHAGOGASTRODUODENOSCOPY (EGD)
Anesthesia: Moderate Sedation

## 2013-05-13 MED ORDER — MEPERIDINE HCL 50 MG/ML IJ SOLN
INTRAMUSCULAR | Status: DC | PRN
  Administered 2013-05-13: 10 mg via INTRAVENOUS
  Administered 2013-05-13 (×2): 20 mg via INTRAVENOUS

## 2013-05-13 MED ORDER — BUTAMBEN-TETRACAINE-BENZOCAINE 2-2-14 % EX AERO
INHALATION_SPRAY | CUTANEOUS | Status: DC | PRN
  Administered 2013-05-13: 2 via TOPICAL

## 2013-05-13 MED ORDER — MIDAZOLAM HCL 5 MG/5ML IJ SOLN
INTRAMUSCULAR | Status: AC
  Filled 2013-05-13: qty 10

## 2013-05-13 MED ORDER — SODIUM CHLORIDE 0.9 % IV SOLN
INTRAVENOUS | Status: DC
  Administered 2013-05-13: 1000 mL via INTRAVENOUS

## 2013-05-13 MED ORDER — MIDAZOLAM HCL 5 MG/5ML IJ SOLN
INTRAMUSCULAR | Status: DC | PRN
  Administered 2013-05-13 (×4): 1 mg via INTRAVENOUS
  Administered 2013-05-13: 2 mg via INTRAVENOUS

## 2013-05-13 MED ORDER — FERROUS SULFATE 325 (65 FE) MG PO TBEC: 325.0000 mg | DELAYED_RELEASE_TABLET | Freq: Three times a day (TID) | ORAL | Status: DC

## 2013-05-13 MED ORDER — MEPERIDINE HCL 50 MG/ML IJ SOLN
INTRAMUSCULAR | Status: AC
  Filled 2013-05-13: qty 1

## 2013-05-13 MED ORDER — STERILE WATER FOR IRRIGATION IR SOLN
Status: DC | PRN
  Administered 2013-05-13: 09:00:00

## 2013-05-13 NOTE — Op Note (Signed)
EGD PROCEDURE REPORT  PATIENT:  [name]  MR#:  [id number] Birthdate:  May 16, 1941, 72 y.o., female Endoscopist:  Dr. Malissa Hippo, MD Referred By:  Dr. Saul Fordyce. Sudie Bailey, MD  Procedure Date: 05/13/2013  Procedure:   EGD & Colonoscopy  Indications:  Patient is 72 year old Caucasian female with multiple medical problems including GERD, gastroparesis and history of colon carcinoma who has iron deficiency anemia. Her stool is guaiac negative. Her last colonoscopy was over 3-1/2 years ago.             Informed Consent:  The risks, benefits, alternatives & imponderables which include, but are not limited to, bleeding, infection, perforation, drug reaction and potential missed lesion have been reviewed.  The potential for biopsy, lesion removal, esophageal dilation, etc. have also been discussed.  Questions have been answered.  All parties agreeable.  Please see history & physical in medical record for more information.  Medications:  Demerol 50 mg IV Versed 6 mg IV Cetacaine spray topically for oropharyngeal anesthesia  EGD  Description of procedure:  The endoscope was introduced through the mouth and advanced to the second portion of the duodenum without difficulty or limitations. The mucosal surfaces were surveyed very carefully during advancement of the scope and upon withdrawal.  Findings:  Esophagus:  Mucosa of the esophagus was normal. GE junction was unremarkable. GEJ:  36 cm Stomach:  There was a moderate amount of bile in the stomach and few pieces of food debris. Stomach distended very well with insufflation. Folds in the proximal stomach are normal. Examination mucosa revealed patchy diffuse erythema and granularity at antrum. Pyloric was patent. Angularis was unremarkable. Few small hypoplastic polyps noted at gastric fundus along with loose fundal wrap. Duodenum:  Normal bulbar and post bulbar mucosa.  Therapeutic/Diagnostic Maneuvers Performed:   Antral biopsy taken  for routine histology.  COLONOSCOPY Description of procedure:  After a digital rectal exam was performed, that colonoscope was advanced from the anus through the rectum and colon to the area of the cecum, ileocecal valve and appendiceal orifice. The cecum was deeply intubated. These structures were well-seen and photographed for the record. From the level of the cecum and ileocecal valve, the scope was slowly and cautiously withdrawn. The mucosal surfaces were carefully surveyed utilizing scope tip to flexion to facilitate fold flattening as needed. The scope was pulled down into the rectum where a thorough exam including retroflexion was performed.  Findings:   Prep excellent. Wide-open ileocolonic anastomosis in the region of hepatic flexure. Distal small bowel was examined for 15 cm and was normal. Few scattered diverticula throughout the colon but no evidence of polyps or other abnormalities. Normal rectal mucosa and anal rectal junction.  Therapeutic/Diagnostic Maneuvers Performed:  None  Complications:  None  Colon Withdrawal Time:  8 minutes  Impression:  EGD findings; Scant amount of food debris with patent pylorus consistent with diagnosis of gastroparesis. Antral gastritis. Biopsy taken. Loose fundal wrap and few small hyperplastic appearing polyps at gastric fundus which were left alone.  Colonoscopy findings; Normal mucosa of distal small bowel. Patent ileocolonic anastomosis. Few scattered diverticula throughout the colon.  Comment. Patient may have impaired iron absorption secondary to chronic acid suppression. If she does not respond to oral iron therapy will consider parenteral iron  Recommendations:  Ferrous sulfate 325 mg by mouth daily. I will contact patient with results of biopsy results. H&H in 4 weeks.  Maryn Freelove U  05/13/2013 9:48 AM  CC: Dr. Milana Obey, MD & Dr.  No ref. provider found

## 2013-05-13 NOTE — H&P (Signed)
[name] is an 72 y.o. female.   Chief Complaint: Patient's here for EGD and colonoscopy. HPI: Patient is 72 year old Caucasian female with multiple medical problems including chronic GERD gastroparesis and history of colon carcinoma who is undergoing diagnostic evaluation. She was found to have iron deficiency anemia by Dr. Sudie Bailey. Her stool was guaiac-negative. She denies melena or rectal bleeding. She states she is doing better since she's been on domperidone. She denies using NSAIDs. She has history of colon carcinoma and colonoscopy is planned from the year but postponed because of ongoing problems with nausea and vomiting.  Past Medical History  Diagnosis Date  . Nausea   . Bloating   . Dysphagia   . Gastroesophageal reflux   . Gastroparesis   . Anemia   . TIA (transient ischemic attack)     "3-4 before starting RX; none since" (04/19/2013)  . PONV (postoperative nausea and vomiting)   . Fibromyalgia   . Renal insufficiency   . Chronic neck pain   . Dizziness   . Chronic heartburn   . Chronic pain   . Anxiety   . Family history of anesthesia complication     "daughter has PONV too" (04/19/2013)  . Hypertension     "for diabetes; don't have high blood pressure" (04/19/2013)  . Type II diabetes mellitus   . Bleeding stomach ulcer 1980's  . History of blood transfusion 1980's    "w/bleeding stomach ulcer" (04/19/2013)  . Hiatal hernia     "had it before; had OR; got it again" (04/19/2013)  . Chronic headaches   . Migraine     "take RX for it qd" (04/19/2013)  . DDD (degenerative disc disease), lumbar   . Arthritis     "all over" (04/19/2013)  . Chronic back pain   . Colon cancer   . Diabetic peripheral neuropathy     "in my feet" (04/19/2013)  . Walking pneumonia ~ 1966  . Pneumonia 04/2012  . Glaucoma, bilateral     Past Surgical History  Procedure Laterality Date  . Hemicolectomy  2010    ZIEGLER  . Colonoscopy  10/11/2010  . Colonoscopy  11/23/2009   . Colonoscopy  07/28/2008    W/SNARE  . Colonoscopy  06/28/07  . Colonoscopy  05/10/07    W/POLYP  . Colonoscopy  12/28/00  . Upper gastrointestinal endoscopy  10/11/2010    EGD ED  . Upper gastrointestinal endoscopy  11/23/2009  . Upper gastrointestinal endoscopy  05/10/07    EGD ED  . Upper gastrointestinal endoscopy  08/11/01    EGD ED  . Posterior fusion cervical spine  1985    "had a broken neck" (04/19/2013)  . Hiatal hernia repair  1970's  . Tonsillectomy  ~ 1953  . Colon surgery    . Cholecystectomy  1980's  . Anterior cervical discectomy  ~ 2009    "only cleaned out arthritis and spurs" (04/19/2013)  . Vaginal hysterectomy    . Cataract extraction w/ intraocular lens implant Left 04/13/2013  . Abdominal adhesion surgery  ~ 2012    "repaired wrap where they did hiatal hernia OR too" 911/04/2013)    Family History  Problem Relation Age of Onset  . Heart disease Mother   . Diabetes Mother   . Dementia Father   . Healthy Sister   . Diabetes Brother   . Kidney cancer Brother   . Diabetes Brother   . Neuropathy Brother   . Diabetes Daughter   . Diabetes Daughter   .  Diabetes Son    Social History:  reports that she quit smoking about 22 years ago. Her smoking use included Cigarettes. She has a 22.5 pack-year smoking history. She has never used smokeless tobacco. She reports that she does not drink alcohol or use illicit drugs.  Allergies:  Allergies  Allergen Reactions  . Cefuroxime Axetil Nausea And Vomiting  . Codeine Nausea And Vomiting  . Diltiazem Nausea And Vomiting  . Morphine Other (See Comments)    "it will kill me" "made me stop breathing"  . Ondansetron Hives  . Shellfish Allergy Other (See Comments)    Blood sugar drops  . Aspirin Other (See Comments)    Gi bleed   . Penicillins Rash    Medications Prior to Admission  Medication Sig Dispense Refill  . ALPRAZolam (XANAX XR) 1 MG 24 hr tablet Take 0.5-1 mg by mouth at bedtime as needed for  sleep (Anxiety).       . Calcium Carbonate-Vitamin D (CALCIUM + D PO) Take 1 tablet by mouth daily.       . clopidogrel (PLAVIX) 75 MG tablet Take 75 mg by mouth daily.        . Cyanocobalamin (VITAMIN B 12 PO) Take 2,000 mcg by mouth daily.      Marland Kitchen dexlansoprazole (DEXILANT) 60 MG capsule Take 1 capsule (60 mg total) by mouth daily.  90 capsule  3  . docusate sodium (COLACE) 100 MG capsule Take 100 mg by mouth as needed for constipation.      . enalapril (VASOTEC) 5 MG tablet Take 5 mg by mouth daily.      . famotidine (PEPCID) 20 MG tablet Take 2 tablets (40 mg total) by mouth at bedtime.  90 tablet  3  . fentaNYL (DURAGESIC - DOSED MCG/HR) 50 MCG/HR Place 50 mcg onto the skin every 3 (three) days.      . metFORMIN (GLUCOPHAGE) 1000 MG tablet Take 1 tablet (1,000 mg total) by mouth daily. HOLD for 2 days, resume on 04/22/2013.  30 tablet  1  . MISC NATURAL PRODUCTS ER PO Take 1-3 capsules by mouth daily. Digestive Blend Softgels      . mometasone (NASONEX) 50 MCG/ACT nasal spray Place 2 sprays into the nose daily as needed (Allergies).       . Multiple Vitamins-Minerals (MULTIVITAMINS THER. W/MINERALS) TABS tablet Take 1 tablet by mouth daily.      . polyethylene glycol powder (GLYCOLAX/MIRALAX) powder Take 17 g by mouth daily.      . promethazine (PHENERGAN) 12.5 MG tablet Take 1 tablet (12.5 mg total) by mouth every 8 (eight) hours as needed for nausea.  10 tablet  0  . ranolazine (RANEXA) 500 MG 12 hr tablet Take 1 tablet (500 mg total) by mouth 2 (two) times daily.  60 tablet  3  . silodosin (RAPAFLO) 8 MG CAPS capsule Take 8 mg by mouth daily with breakfast.      . temazepam (RESTORIL) 30 MG capsule Take 30 mg by mouth at bedtime as needed for sleep.      Marland Kitchen topiramate (TOPAMAX) 100 MG tablet Take 100 mg by mouth at bedtime.      . Travoprost, BAK Free, (TRAVATAN) 0.004 % SOLN ophthalmic solution Place 1 drop into both eyes at bedtime.      Marland Kitchen albuterol (PROVENTIL HFA) 108 (90 BASE) MCG/ACT  inhaler Inhale 2 puffs into the lungs every 6 (six) hours as needed for shortness of breath.  Results for orders placed during the hospital encounter of 05/13/13 (from the past 48 hour(s))  GLUCOSE, CAPILLARY     Status: None   Collection Time    05/13/13  8:19 AM      Result Value Range   Glucose-Capillary 97  70 - 99 mg/dL   No results found.  ROS  Blood pressure 144/65, pulse 50, temperature 98.4 F (36.9 C), temperature source Oral, resp. rate 20, SpO2 98.00%. Physical Exam  Constitutional:  Well-developed and Caucasian female in NAD  HENT:  Mouth/Throat: Oropharynx is clear and moist.  Eyes: Conjunctivae are normal. No scleral icterus.  Neck: No thyromegaly present.  Cardiovascular: Normal rate, regular rhythm and normal heart sounds.   No murmur heard. Respiratory: Effort normal and breath sounds normal.  GI: Soft. She exhibits no distension and no mass. There is no tenderness.  Musculoskeletal: She exhibits no edema.  Lymphadenopathy:    She has no cervical adenopathy.  Neurological: She is alert.  Skin: Skin is warm and dry.     Assessment/Plan Recurrent nausea and vomiting. Chronic GERD and gastroparesis. Iron deficiency anemia. History of colon carcinoma. EGD and Colonoscopy.  Joshiah Traynham U 05/13/2013, 8:59 AM

## 2013-05-18 ENCOUNTER — Encounter (HOSPITAL_COMMUNITY): Payer: Self-pay | Admitting: Internal Medicine

## 2013-05-18 ENCOUNTER — Telehealth (INDEPENDENT_AMBULATORY_CARE_PROVIDER_SITE_OTHER): Payer: Self-pay | Admitting: *Deleted

## 2013-05-18 NOTE — Telephone Encounter (Signed)
Patient called me and has ask for the results of her recent Bx results.  The result is in Epic 05/13/13 : Diagnosis Stomach, biopsy - HELICOBACTER PYLORI GASTRITIS. - NO EVIDENCE OF INTESTINAL METAPLASIA, DYSPLASIA, OR MALIGNANCY.  I will review with Dr.Rehman before calling [name]. Forwarded to Dr.Rehman as Lorain Childes.

## 2013-05-19 ENCOUNTER — Telehealth: Payer: Self-pay | Admitting: *Deleted

## 2013-05-19 ENCOUNTER — Other Ambulatory Visit (INDEPENDENT_AMBULATORY_CARE_PROVIDER_SITE_OTHER): Payer: Self-pay | Admitting: Internal Medicine

## 2013-05-19 MED ORDER — BIS SUBCIT-METRONID-TETRACYC 140-125-125 MG PO CAPS
3.0000 | ORAL_CAPSULE | Freq: Three times a day (TID) | ORAL | Status: DC
Start: 2013-05-19 — End: 2013-07-29

## 2013-05-19 NOTE — Telephone Encounter (Signed)
Mailed rx for Ranexa to pt so she may take to [place]

## 2013-05-19 NOTE — Telephone Encounter (Signed)
Pt was sent home with samples of renexa to see if they would work for her. She is not having any problems and wouls like a RX called in to the [place] for her.

## 2013-05-20 NOTE — Telephone Encounter (Signed)
Patient has been called and explained to about the H-Pylori. Her Pylera is going to be delivered to her later this morning. I explained that the medication may make her stools dark. She is going to call if she has any problems , and will call with a progress report after she completes the medication. Patient has an appointment 06/20/13 with Dr.Rehman.

## 2013-05-23 ENCOUNTER — Encounter (INDEPENDENT_AMBULATORY_CARE_PROVIDER_SITE_OTHER): Payer: Self-pay | Admitting: *Deleted

## 2013-05-31 ENCOUNTER — Encounter (INDEPENDENT_AMBULATORY_CARE_PROVIDER_SITE_OTHER): Payer: Self-pay

## 2013-06-20 ENCOUNTER — Ambulatory Visit (INDEPENDENT_AMBULATORY_CARE_PROVIDER_SITE_OTHER): Payer: Medicare Other | Admitting: Internal Medicine

## 2013-06-20 ENCOUNTER — Encounter (INDEPENDENT_AMBULATORY_CARE_PROVIDER_SITE_OTHER): Payer: Self-pay | Admitting: Internal Medicine

## 2013-06-20 VITALS — BP 122/70 | HR 76 | Temp 98.9°F | Resp 16 | Ht 61.0 in | Wt 129.1 lb

## 2013-06-20 DIAGNOSIS — D509 Iron deficiency anemia, unspecified: Secondary | ICD-10-CM

## 2013-06-20 DIAGNOSIS — K3184 Gastroparesis: Secondary | ICD-10-CM

## 2013-06-20 NOTE — Patient Instructions (Addendum)
Gastric emptying study to be scheduled. Please don't forget to take 20 mg of domperidone 1 hour before the test. CBC or blood work in 2 months

## 2013-06-20 NOTE — Progress Notes (Addendum)
Presenting complaint;  Followup for gastroparesis and iron deficiency anemia.   Database;  Patient is 73 year old Caucasian female who has diabetic gastroparesis and history of colon carcinoma. She was recently found to have iron deficiency anemia by Dr. Karie Kirks. She underwent EGD and colonoscopy on 05/13/2013. EGD revealed antral gastritis secondary to H. Pylori. Colonoscopy reveals wide-open ileocolonic anastomosis in the region of hepatic flexure and scattered diverticula throughout the colon. She was felt to have iron deficiency anemia secondary to impaired on an adoption due to chronic PPI therapy. She is back on iron pills twice daily. She had blood work by Dr. Anastasio Champion last week. She now presents for followup visit.  Subjective;  She states she has not vomited since her EGD and colonoscopy. However she continues to complain of bloating after each meal. She says her meals are very small consisting of bland food. She also complains of daily heartburn despite taking 2 medications. She also complains of frequent burping. She is using TUMS an as-needed basis which relieves heartburn. She is still having nausea but not as bad as it used to be. She denies melena or rectal bleeding. She states she was able to finish 10 days of Pylera. She states she is at very few episodes of chest pain since she was begun on Ranexa by Dr. Bronson Ing.    Current Medications: Current Outpatient Prescriptions  Medication Sig Dispense Refill  . ALPRAZolam (XANAX XR) 1 MG 24 hr tablet Take 0.5-1 mg by mouth at bedtime as needed for sleep (Anxiety).       . Calcium Carbonate-Vitamin D (CALCIUM + D PO) Take 1 tablet by mouth daily.       . clopidogrel (PLAVIX) 75 MG tablet Take 75 mg by mouth daily.        . Cyanocobalamin (VITAMIN B 12 PO) Take 2,000 mcg by mouth daily.      Marland Kitchen dexlansoprazole (DEXILANT) 60 MG capsule Take 1 capsule (60 mg total) by mouth daily.  90 capsule  3  . docusate sodium (COLACE) 100 MG  capsule Take 100 mg by mouth as needed for constipation.      . enalapril (VASOTEC) 5 MG tablet Take 5 mg by mouth daily.      . famotidine (PEPCID) 20 MG tablet Take 2 tablets (40 mg total) by mouth at bedtime.  90 tablet  3  . fentaNYL (DURAGESIC - DOSED MCG/HR) 50 MCG/HR Place 50 mcg onto the skin every 3 (three) days.      . ferrous sulfate 325 (65 FE) MG EC tablet Take 1 tablet (325 mg total) by mouth 3 (three) times daily with meals.    3  . levofloxacin (LEVAQUIN) 500 MG tablet Take 500 mg by mouth daily.      . metFORMIN (GLUCOPHAGE) 1000 MG tablet Take 1 tablet (1,000 mg total) by mouth daily. HOLD for 2 days, resume on 04/22/2013.  30 tablet  1  . MISC NATURAL PRODUCTS ER PO Take 1-3 capsules by mouth daily. Digestive Blend Softgels      . mometasone (NASONEX) 50 MCG/ACT nasal spray Place 2 sprays into the nose daily as needed (Allergies).       . Multiple Vitamins-Minerals (MULTIVITAMINS THER. W/MINERALS) TABS tablet Take 1 tablet by mouth daily.      . promethazine (PHENERGAN) 12.5 MG tablet Take 1 tablet (12.5 mg total) by mouth every 8 (eight) hours as needed for nausea.  10 tablet  0  . ranolazine (RANEXA) 500 MG 12 hr tablet Take  1 tablet (500 mg total) by mouth 2 (two) times daily.  60 tablet  3  . silodosin (RAPAFLO) 8 MG CAPS capsule Take 8 mg by mouth daily with breakfast.      . temazepam (RESTORIL) 30 MG capsule Take 30 mg by mouth at bedtime as needed for sleep.      Marland Kitchen topiramate (TOPAMAX) 100 MG tablet Take 100 mg by mouth at bedtime.      . Travoprost, BAK Free, (TRAVATAN) 0.004 % SOLN ophthalmic solution Place 1 drop into both eyes at bedtime.      Marland Kitchen albuterol (PROVENTIL HFA) 108 (90 BASE) MCG/ACT inhaler Inhale 2 puffs into the lungs every 6 (six) hours as needed for shortness of breath.       . bismuth-metronidazole-tetracycline (PYLERA) 140-125-125 MG per capsule Take 3 capsules by mouth 4 (four) times daily -  before meals and at bedtime.  120 capsule  0   No current  facility-administered medications for this visit.      Domperidone 20 mg by mouth before breakfast and evening meals and 10 mg before lunch daily.   Objective: Blood pressure 122/70, pulse 76, temperature 98.9 F (37.2 C), temperature source Oral, resp. rate 16, height 5\' 1"  (1.549 m), weight 129 lb 1.6 oz (58.559 kg).  patient is alert and in no acute distress. Conjunctiva is pink. Sclera is nonicteric Oropharyngeal mucosa is normal. No neck masses or thyromegaly noted. Cardiac exam with regular rhythm normal S1 and S2. No murmur or gallop noted. Lungs are clear to auscultation. Abdomen is flat and soft. No organomegaly or masses noted.  No LE edema or clubbing noted.  Labs/studies Results: Lab data from 06/16/2013. WBC 7.2, H&H 11.1 and 34.2 and platelet count 177K. H&H on 04/15/2013 was 9.9 and 30.9.  Assessment:  #1. Diabetic gastroparesis. She is not responding well to therapy. I believe her condition is made worse with narcotic therapy. Since her symptoms are not well controlled it would be reasonable to repeat gastric emptying study while on therapy. #2. iron deficiency anemia. H&H is coming up with oral iron therapy. #3. H. pylori gastritis treated with 10 days of Pylera last month. #4.  History of colon carcinoma. Recent surveillance colonoscopy was unremarkable.   Plan:  Continue Dexilant in a.m. and famotidine and p.m. at current dose. Continue domperidone 20 mg twice a day and 10 mg before lunch. Continue gastroparesis diet. Solid phase gastric emptying study while on domperidone. CBC in 2 months. Office visit in 4 months.

## 2013-06-24 ENCOUNTER — Encounter (HOSPITAL_COMMUNITY)
Admission: RE | Admit: 2013-06-24 | Discharge: 2013-06-24 | Disposition: A | Discharge status: Home / Self Care | Payer: Medicare Other | Source: Ambulatory Visit | Attending: Internal Medicine | Admitting: Internal Medicine

## 2013-06-24 ENCOUNTER — Other Ambulatory Visit (HOSPITAL_COMMUNITY): Payer: Medicare Other

## 2013-06-24 ENCOUNTER — Encounter (HOSPITAL_COMMUNITY): Payer: Self-pay

## 2013-06-24 DIAGNOSIS — K3184 Gastroparesis: Secondary | ICD-10-CM | POA: Insufficient documentation

## 2013-06-24 MED ORDER — TECHNETIUM TC 99M SULFUR COLLOID
2.0000 | Freq: Once | INTRAVENOUS | Status: AC | PRN
  Administered 2013-06-24: 2 via ORAL

## 2013-06-27 ENCOUNTER — Ambulatory Visit (INDEPENDENT_AMBULATORY_CARE_PROVIDER_SITE_OTHER): Payer: Medicare Other | Admitting: Internal Medicine

## 2013-07-08 ENCOUNTER — Telehealth (INDEPENDENT_AMBULATORY_CARE_PROVIDER_SITE_OTHER): Payer: Self-pay | Admitting: *Deleted

## 2013-07-08 NOTE — Telephone Encounter (Signed)
Dr.Rehman , patient was called this morning stating that she was returning your call from last night. Her brother has passed away and she ws at the [place] home, [place] today and she will be back at her house after 6 pm this evening. She ask if you would please call her back and discuss this other Physician. [phone].

## 2013-07-08 NOTE — Telephone Encounter (Signed)
Call returned no anawer.

## 2013-07-10 ENCOUNTER — Other Ambulatory Visit (INDEPENDENT_AMBULATORY_CARE_PROVIDER_SITE_OTHER): Payer: Self-pay | Admitting: Internal Medicine

## 2013-07-11 ENCOUNTER — Telehealth (INDEPENDENT_AMBULATORY_CARE_PROVIDER_SITE_OTHER): Payer: Self-pay | Admitting: *Deleted

## 2013-07-11 NOTE — Telephone Encounter (Signed)
To be addressed with Dr.Rehman. 

## 2013-07-11 NOTE — Telephone Encounter (Signed)
Dr.Rehman , Do you know the medication Jailey is requesting/ speaking of?

## 2013-07-11 NOTE — Telephone Encounter (Signed)
I spoke to patient about referral to [facility] and she asked me if Dr Laural Golden had called her prescription to [facility] -- she couldn't remember name of medicine but looking on GES report he mention Domperidone 20 mg, if this is the medicine I didn't see it on her med list. I told her I would get you to look in to.Marland Kitchen

## 2013-07-12 ENCOUNTER — Other Ambulatory Visit (INDEPENDENT_AMBULATORY_CARE_PROVIDER_SITE_OTHER): Payer: Self-pay | Admitting: Internal Medicine

## 2013-07-12 MED ORDER — ERYTHROMYCIN ETHYLSUCCINATE 200 MG/5ML PO SUSR: 100.0000 mg | Freq: Three times a day (TID) | ORAL | Status: DC

## 2013-07-12 NOTE — Telephone Encounter (Signed)
Patient was called and made aware. 

## 2013-07-12 NOTE — Telephone Encounter (Signed)
Please tell patient that prescription for erythromycin liquid has been sent to her pharmacy. She can stop domperidone

## 2013-07-19 ENCOUNTER — Other Ambulatory Visit (INDEPENDENT_AMBULATORY_CARE_PROVIDER_SITE_OTHER): Payer: Self-pay | Admitting: Internal Medicine

## 2013-07-19 ENCOUNTER — Telehealth (INDEPENDENT_AMBULATORY_CARE_PROVIDER_SITE_OTHER): Payer: Self-pay | Admitting: *Deleted

## 2013-07-19 MED ORDER — PROMETHAZINE HCL 12.5 MG PO TABS: 12.5000 mg | ORAL_TABLET | Freq: Three times a day (TID) | ORAL | Status: DC | PRN

## 2013-07-19 NOTE — Telephone Encounter (Signed)
Patient called the office on 07/18/13 and left a voice message with the following information. Every time she eats she is getting indigestion and a sour taste in her mouth. She stated that she mainly eats broth . She ask if there is something else that she can try and would we call her back.

## 2013-07-20 NOTE — Telephone Encounter (Signed)
Patient called the office on 07/19/13 and ask if Dr.Rehman would call her in something for nausea. Dr.Rehman called in [place] on 07/19/13. Patient is aware.

## 2013-07-25 ENCOUNTER — Ambulatory Visit: Payer: Medicare Other | Admitting: Cardiovascular Disease

## 2013-07-29 ENCOUNTER — Encounter: Payer: Self-pay | Admitting: Cardiovascular Disease

## 2013-07-29 ENCOUNTER — Ambulatory Visit (INDEPENDENT_AMBULATORY_CARE_PROVIDER_SITE_OTHER): Payer: Medicare Other | Admitting: Cardiovascular Disease

## 2013-07-29 VITALS — BP 134/71 | HR 54 | Ht 61.0 in | Wt 128.0 lb

## 2013-07-29 DIAGNOSIS — R42 Dizziness and giddiness: Secondary | ICD-10-CM

## 2013-07-29 DIAGNOSIS — R079 Chest pain, unspecified: Secondary | ICD-10-CM

## 2013-07-29 DIAGNOSIS — G909 Disorder of the autonomic nervous system, unspecified: Secondary | ICD-10-CM

## 2013-07-29 DIAGNOSIS — G908 Other disorders of autonomic nervous system: Secondary | ICD-10-CM

## 2013-07-29 DIAGNOSIS — I498 Other specified cardiac arrhythmias: Secondary | ICD-10-CM

## 2013-07-29 DIAGNOSIS — I2510 Atherosclerotic heart disease of native coronary artery without angina pectoris: Secondary | ICD-10-CM

## 2013-07-29 DIAGNOSIS — G9089 Other disorders of autonomic nervous system: Secondary | ICD-10-CM

## 2013-07-29 DIAGNOSIS — R001 Bradycardia, unspecified: Secondary | ICD-10-CM

## 2013-07-29 NOTE — Progress Notes (Signed)
Patient ID: [name], female   DOB: [birth year], 73 y.o.   MRN: [id number]      SUBJECTIVE: The patient is doing better, and the Ranexa I prescribed at her last visit has helped alleviate her chest pain. She also denies lightheadedness, dizziness and syncope. She has been going through a lot over the past few weeks has both a brother and her best friend passed away. She has also had problems with gastroparesis and is being scheduled to go to [facility] on March 4 for a possible intra-abdominal pacemaker. She has only been able to tolerate liquids.    Allergies  Allergen Reactions  . Cefuroxime Axetil Nausea And Vomiting  . Codeine Nausea And Vomiting  . Diltiazem Nausea And Vomiting  . Morphine Other (See Comments)    "it will kill me" "made me stop breathing"  . Ondansetron Hives  . Shellfish Allergy Other (See Comments)    Blood sugar drops  . Aspirin Other (See Comments)    Gi bleed   . Penicillins Rash    Current Outpatient Prescriptions  Medication Sig Dispense Refill  . albuterol (PROVENTIL HFA) 108 (90 BASE) MCG/ACT inhaler Inhale 2 puffs into the lungs every 6 (six) hours as needed for shortness of breath.       . ALPRAZolam (XANAX XR) 1 MG 24 hr tablet Take 0.5-1 mg by mouth at bedtime as needed for sleep (Anxiety).       . Calcium Carbonate-Vitamin D (CALCIUM + D PO) Take 1 tablet by mouth daily.       . clopidogrel (PLAVIX) 75 MG tablet Take 75 mg by mouth daily.        . Cyanocobalamin (VITAMIN B 12 PO) Take 2,000 mcg by mouth daily.      Marland Kitchen dexlansoprazole (DEXILANT) 60 MG capsule Take 1 capsule (60 mg total) by mouth daily.  90 capsule  3  . docusate sodium (COLACE) 100 MG capsule Take 100 mg by mouth as needed for constipation.      . enalapril (VASOTEC) 5 MG tablet Take 5 mg by mouth daily.      Marland Kitchen erythromycin ethylsuccinate (EES) 200 MG/5ML suspension Take 2.5 mLs (100 mg total) by mouth 3 (three) times daily before meals.  225 mL  0  . famotidine  (PEPCID) 20 MG tablet Take 2 tablets (40 mg total) by mouth at bedtime.  90 tablet  3  . fentaNYL (DURAGESIC - DOSED MCG/HR) 50 MCG/HR Place 50 mcg onto the skin every 3 (three) days.      . ferrous sulfate 325 (65 FE) MG EC tablet Take 1 tablet (325 mg total) by mouth 3 (three) times daily with meals.    3  . metFORMIN (GLUCOPHAGE) 1000 MG tablet Take 1 tablet (1,000 mg total) by mouth daily. HOLD for 2 days, resume on 04/22/2013.  30 tablet  1  . MISC NATURAL PRODUCTS ER PO Take 1-3 capsules by mouth daily. Digestive Blend Softgels      . mometasone (NASONEX) 50 MCG/ACT nasal spray Place 2 sprays into the nose daily as needed (Allergies).       . Multiple Vitamins-Minerals (MULTIVITAMINS THER. W/MINERALS) TABS tablet Take 1 tablet by mouth daily.      . promethazine (PHENERGAN) 12.5 MG tablet Take 1 tablet (12.5 mg total) by mouth every 8 (eight) hours as needed for nausea.  20 tablet  0  . ranolazine (RANEXA) 500 MG 12 hr tablet Take 1 tablet (500 mg total) by mouth 2 (  two) times daily.  60 tablet  3  . silodosin (RAPAFLO) 8 MG CAPS capsule Take 8 mg by mouth daily with breakfast.      . temazepam (RESTORIL) 30 MG capsule Take 30 mg by mouth at bedtime as needed for sleep.      Marland Kitchen topiramate (TOPAMAX) 100 MG tablet Take 100 mg by mouth at bedtime.      . Travoprost, BAK Free, (TRAVATAN) 0.004 % SOLN ophthalmic solution Place 1 drop into both eyes at bedtime.       No current facility-administered medications for this visit.    Past Medical History  Diagnosis Date  . Nausea   . Bloating   . Dysphagia   . Gastroesophageal reflux   . Gastroparesis   . Anemia   . TIA (transient ischemic attack)     "3-4 before starting RX; none since" (04/19/2013)  . PONV (postoperative nausea and vomiting)   . Fibromyalgia   . Renal insufficiency   . Chronic neck pain   . Dizziness   . Chronic heartburn   . Chronic pain   . Anxiety   . Family history of anesthesia complication     "daughter has  PONV too" (04/19/2013)  . Hypertension     "for diabetes; don't have high blood pressure" (04/19/2013)  . Type II diabetes mellitus   . Bleeding stomach ulcer 1980's  . History of blood transfusion 1980's    "w/bleeding stomach ulcer" (04/19/2013)  . Hiatal hernia     "had it before; had OR; got it again" (04/19/2013)  . Chronic headaches   . Migraine     "take RX for it qd" (04/19/2013)  . DDD (degenerative disc disease), lumbar   . Arthritis     "all over" (04/19/2013)  . Chronic back pain   . Colon cancer   . Diabetic peripheral neuropathy     "in my feet" (04/19/2013)  . Walking pneumonia ~ 1966  . Pneumonia 04/2012  . Glaucoma, bilateral     Past Surgical History  Procedure Laterality Date  . Hemicolectomy  2010    ZIEGLER  . Colonoscopy  10/11/2010  . Colonoscopy  11/23/2009  . Colonoscopy  07/28/2008    W/SNARE  . Colonoscopy  06/28/07  . Colonoscopy  05/10/07    W/POLYP  . Colonoscopy  12/28/00  . Upper gastrointestinal endoscopy  10/11/2010    EGD ED  . Upper gastrointestinal endoscopy  11/23/2009  . Upper gastrointestinal endoscopy  05/10/07    EGD ED  . Upper gastrointestinal endoscopy  08/11/01    EGD ED  . Posterior fusion cervical spine  1985    "had a broken neck" (04/19/2013)  . Hiatal hernia repair  1970's  . Tonsillectomy  ~ 1953  . Colon surgery    . Cholecystectomy  1980's  . Anterior cervical discectomy  ~ 2009    "only cleaned out arthritis and spurs" (04/19/2013)  . Vaginal hysterectomy    . Cataract extraction w/ intraocular lens implant Left 04/13/2013  . Abdominal adhesion surgery  ~ 2012    "repaired wrap where they did hiatal hernia OR too" 911/04/2013)  . Colonoscopy with esophagogastroduodenoscopy (egd) N/A 05/13/2013    Procedure: COLONOSCOPY WITH ESOPHAGOGASTRODUODENOSCOPY (EGD);  Surgeon: Rogene Houston, MD;  Location: [facility];  Service: Endoscopy;  Laterality: N/A;  855    History   Social History  . Marital Status:  Widowed    Spouse Name: N/A    Number of Children: N/A  .  Years of Education: N/A   Occupational History  .     Social History Main Topics  . Smoking status: Former Smoker -- 1.50 packs/day for 15 years    Types: Cigarettes    Quit date: 03/17/1991  . Smokeless tobacco: Never Used     Comment: Patient states that it has ben greater than 20 years since she quit smoking  . Alcohol Use: No  . Drug Use: No  . Sexual Activity: No   Other Topics Concern  . Not on file   Social History Narrative   Patient lives at home alone.    Patient is retired.    Patient is widowed.    Patient has 4 children.   Patient has a 11th grade education.            Filed Vitals:   07/29/13 1019  Height: 5\' 1"  (1.549 m)  Weight: 128 lb (58.06 kg)    PHYSICAL EXAM General: NAD Neck: No JVD, no thyromegaly or thyroid nodule.  Lungs: Clear to auscultation bilaterally with normal respiratory effort. CV: Nondisplaced PMI.  Heart regular S1/S2, no S3/S4, no murmur.  No peripheral edema.  No carotid bruit.  Normal pedal pulses.  Abdomen: Soft, nontender, no hepatosplenomegaly, no distention.  Neurologic: Alert and oriented x 3.  Psych: Normal affect. Extremities: No clubbing or cyanosis.   ECG: reviewed and available in electronic records.    Cardiac Cath: 04/20/2003  Left mainstem: Normal  Left anterior descending (LAD): 30% disease in the mid vessel. 40% distally.  Left circumflex (LCx): 30-40% distal OM1. Otherwise mild irregularities.  Right coronary artery (RCA): 20% disease proximally, 30% at the crux, 40-50% distally.  Left ventriculography: Left ventricular systolic function is normal, LVEF is estimated at 55-65%, there is no significant mitral regurgitation  Final Conclusions:  1. Nonobstructive CAD  2. Normal LV function   ASSESSMENT AND PLAN:  1. Chest pain: As stated previously, this is not due to hemodynamically significant obstructive CAD. It is unclear what the etiology is,  as she has had numerous tests which have been unrevealing. She appears to have a chest pain syndrome of which the etiologies include small vessel ischemic disease vs esophageal disease/spasm. At her last visit, I prescribed Ranexa 500 mg bid, as the nitrates she took during her hospitalization did not alleviate her symptoms.  She is no longer complaining of chest pain. 2. Bradycardia/chronotropic incompetence: Holter monitor showed normal sinus rhythm with very few PACs and sinus bradycardia with an average heart rate of 50 beats per minute. There were no long pauses detected. She has had no episodes of dizziness or syncope, and does have a longstanding history of vertigo. Given her known h/o severe gastroparesis, she may very well have an autonomic neuropathy.   Dispo: f/u 3 months.        Kate Sable, M.D., F.A.C.C.

## 2013-07-29 NOTE — Patient Instructions (Signed)
Your physician recommends that you schedule a follow-up appointment in: 3 months. Your physician recommends that you continue on your current medications as directed. Please refer to the Current Medication list given to you today. 

## 2013-08-01 ENCOUNTER — Ambulatory Visit: Payer: Medicare Other | Admitting: Cardiovascular Disease

## 2013-08-03 ENCOUNTER — Encounter (INDEPENDENT_AMBULATORY_CARE_PROVIDER_SITE_OTHER): Payer: Self-pay | Admitting: *Deleted

## 2013-08-03 ENCOUNTER — Other Ambulatory Visit (INDEPENDENT_AMBULATORY_CARE_PROVIDER_SITE_OTHER): Payer: Self-pay | Admitting: *Deleted

## 2013-08-03 DIAGNOSIS — D509 Iron deficiency anemia, unspecified: Secondary | ICD-10-CM

## 2013-08-08 ENCOUNTER — Telehealth (INDEPENDENT_AMBULATORY_CARE_PROVIDER_SITE_OTHER): Payer: Self-pay | Admitting: *Deleted

## 2013-08-08 NOTE — Telephone Encounter (Signed)
Patient would like to know if she may go back on the Reglan. Erythromycin Suspension is no longer working. Unable to keep solids down, fluid intake only. Appointment @ [facility] on 08/10/13.

## 2013-08-09 NOTE — Telephone Encounter (Signed)
Prescription was called to [facility]. Patient was made aware.

## 2013-08-09 NOTE — Telephone Encounter (Signed)
Yes she can; Reglan 10 mg po ac; Please call prescription for Reglan 10 mg 90 doses

## 2013-08-18 LAB — CBC
HCT: 34.4 % — ABNORMAL LOW (ref 36.0–46.0)
Hemoglobin: 11.3 g/dL — ABNORMAL LOW (ref 12.0–15.0)
MCH: 28.8 pg (ref 26.0–34.0)
MCHC: 32.8 g/dL (ref 30.0–36.0)
MCV: 87.5 fL (ref 78.0–100.0)
Platelets: 198 10*3/uL (ref 150–400)
RBC: 3.93 MIL/uL (ref 3.87–5.11)
RDW: 14 % (ref 11.5–15.5)
WBC: 5 10*3/uL (ref 4.0–10.5)

## 2013-10-04 ENCOUNTER — Other Ambulatory Visit (INDEPENDENT_AMBULATORY_CARE_PROVIDER_SITE_OTHER): Payer: Self-pay | Admitting: Internal Medicine

## 2013-10-05 NOTE — Telephone Encounter (Signed)
Per Dr.Rehman may fill with 1 additional refill. 

## 2013-10-24 ENCOUNTER — Ambulatory Visit (INDEPENDENT_AMBULATORY_CARE_PROVIDER_SITE_OTHER): Payer: Medicare Other | Admitting: Internal Medicine

## 2013-10-24 ENCOUNTER — Encounter (INDEPENDENT_AMBULATORY_CARE_PROVIDER_SITE_OTHER): Payer: Self-pay | Admitting: Internal Medicine

## 2013-10-24 ENCOUNTER — Telehealth (INDEPENDENT_AMBULATORY_CARE_PROVIDER_SITE_OTHER): Payer: Self-pay | Admitting: *Deleted

## 2013-10-24 VITALS — BP 118/72 | HR 68 | Temp 97.8°F | Resp 16 | Ht 61.0 in | Wt 119.9 lb

## 2013-10-24 DIAGNOSIS — K3184 Gastroparesis: Secondary | ICD-10-CM

## 2013-10-24 DIAGNOSIS — R634 Abnormal weight loss: Secondary | ICD-10-CM

## 2013-10-24 DIAGNOSIS — I2510 Atherosclerotic heart disease of native coronary artery without angina pectoris: Secondary | ICD-10-CM

## 2013-10-24 DIAGNOSIS — D509 Iron deficiency anemia, unspecified: Secondary | ICD-10-CM

## 2013-10-24 NOTE — Patient Instructions (Signed)
Physician will call you with results of blood work when completed.  

## 2013-10-24 NOTE — Telephone Encounter (Signed)
Per Dr.Rehman the patient will need to have labs drawn. 

## 2013-10-24 NOTE — Progress Notes (Signed)
Presenting complaint;  Follow for gastroparesis he is iron deficiency anemia and weight loss.  Data base ;  Patient is 73 year old Caucasian female who is here for scheduled visit. She was last seen in January 2015. She underwent solid phase gastric emptying study while on domperidone. This study was abnormal. 83% of activity was in the stomach at 2 hours. She was therefore referred to Dr. Derrill Kay of [facility] for further evaluation. She also has history of colon carcinoma and remains in remission. In December 2014 she underwent EGD and colonoscopy for iron deficiency anemia which is felt to be secondary to impaired iron absorption. Her hemoglobin improved from 9.9 g 11.3 with by mouth iron.  She has undergone repeat solid phase gastric emptying study for 4 hours, EGG. pH study and esophageal manometry at [facility] and all of these studies are abnormal. She has an appointment with Dr. Derrill Kay later this week to discuss the results and treatment options.  Subjective;  She remains miserable. She called last month and was begun on metoclopramide. So far she is not having any side effects. She has noted minimal relief of nausea and vomiting. She does not have good appetite. She has lost another 10 pounds since January. In the last 2 years she has lost 25 pounds. She has nausea virtually every day and emesis 2-3 times a week. She denies hematemesis or melena. Her bowels generally move daily. She has epigastric discomfort. She is taking fentanyl patch for neck and shoulder pain. Gastric emptying study at [facility] was done off medication. On her last visit to [facility] she was given prescription for Dronabinol which she stopped after 2 days because of rapid heartbeat and hallucinations.  Current Medications: Outpatient Encounter Prescriptions as of 10/24/2013  Medication Sig  . ALPRAZolam (XANAX XR) 1 MG 24 hr tablet Take 0.5-1 mg by mouth at bedtime as needed for sleep (Anxiety).   . Calcium Carbonate-Vitamin D (CALCIUM + D PO)  Take 1 tablet by mouth daily. TAKING 1,000 CALCIUM AND 2,000 VIT D  . clopidogrel (PLAVIX) 75 MG tablet Take 75 mg by mouth daily.    . Cyanocobalamin (VITAMIN B 12 PO) Take 2,000 mcg by mouth daily.  Marland Kitchen dexlansoprazole (DEXILANT) 60 MG capsule Take 1 capsule (60 mg total) by mouth daily.  Marland Kitchen docusate sodium (COLACE) 100 MG capsule Take 100 mg by mouth as needed for constipation.  . enalapril (VASOTEC) 5 MG tablet Take 5 mg by mouth daily.  . famotidine (PEPCID) 20 MG tablet Take 2 tablets (40 mg total) by mouth at bedtime.  . fentaNYL (DURAGESIC - DOSED MCG/HR) 50 MCG/HR Place 50 mcg onto the skin every 3 (three) days.  . ferrous sulfate 325 (65 FE) MG EC tablet Take 1 tablet (325 mg total) by mouth 3 (three) times daily with meals.  . metFORMIN (GLUCOPHAGE) 1000 MG tablet Take 1 tablet (1,000 mg total) by mouth daily. HOLD for 2 days, resume on 04/22/2013.  . metoCLOPramide (REGLAN) 10 MG tablet TAKE 1 TABLET BEFORE MEALS, THREE TIMES DAILY.  Marland Kitchen MISC NATURAL PRODUCTS ER PO Take 1-3 capsules by mouth daily. Digestive Blend Softgels  . mometasone (NASONEX) 50 MCG/ACT nasal spray Place 2 sprays into the nose daily as needed (Allergies).   . Multiple Vitamins-Minerals (MULTIVITAMINS THER. W/MINERALS) TABS tablet Take 1 tablet by mouth daily.  . promethazine (PHENERGAN) 12.5 MG tablet Take 1 tablet (12.5 mg total) by mouth every 8 (eight) hours as needed for nausea.  . ranolazine (RANEXA) 500 MG 12 hr tablet  Take 1 tablet (500 mg total) by mouth 2 (two) times daily.  . silodosin (RAPAFLO) 8 MG CAPS capsule Take 8 mg by mouth daily with breakfast.  . topiramate (TOPAMAX) 100 MG tablet Take 100 mg by mouth at bedtime.  . Travoprost, BAK Free, (TRAVATAN) 0.004 % SOLN ophthalmic solution Place 1 drop into both eyes at bedtime.  . temazepam (RESTORIL) 30 MG capsule Take 30 mg by mouth at bedtime as needed for sleep.  . [DISCONTINUED] albuterol (PROVENTIL HFA) 108 (90 BASE) MCG/ACT inhaler Inhale 2 puffs  into the lungs every 6 (six) hours as needed for shortness of breath.   . [DISCONTINUED] erythromycin ethylsuccinate (EES) 200 MG/5ML suspension Take 2.5 mLs (100 mg total) by mouth 3 (three) times daily before meals.     Objective: Blood pressure 118/72, pulse 68, temperature 97.8 F (36.6 C), temperature source Oral, resp. rate 16, height 5\' 1"  (1.549 m), weight 119 lb 14.4 oz (54.386 kg). Patient is alert and in no acute distress. Conjunctiva is pink. Sclera is nonicteric Oropharyngeal mucosa is normal. No neck masses or thyromegaly noted. Cardiac exam with regular rhythm normal S1 and S2. She has faint systolic ejection murmur best heard at LLSB. Lungs are clear to auscultation. Abdomen symmetrical and soft with mild midepigastric tenderness. No organomegaly or masses. No LE edema or clubbing noted.  Labs/studies Results: CBC from 08/18/2013 revealed WBC of 5.0 H&H 11.3 and 34.4 and platelet count of 198K.  Assessment:  #1. Iron deficiency anemia. H&H is coming up with oral iron therapy which is reassuring. #2. Gastroparesis primarily secondary to diabetes mellitus with intractable symptoms unresponsive to domperidone and then a dose of Reglan. I reviewed the results of all 4 studies mentioned database and that all abnormal(via care everywhere). #3. Weight loss secondary to poor intake due to intractable gastroparesis.   Plan:  Patient go to the lab for H&H. She will continue metoclopramide 10 mg by mouth 4 times a day until further recommendations made by Dr.Koch. Office visit in 3 months.

## 2013-10-29 LAB — HEMOGLOBIN AND HEMATOCRIT, BLOOD
HCT: 36.2 % (ref 36.0–46.0)
Hemoglobin: 12 g/dL (ref 12.0–15.0)

## 2013-11-03 ENCOUNTER — Ambulatory Visit: Payer: Medicare Other | Admitting: Cardiovascular Disease

## 2013-12-06 ENCOUNTER — Other Ambulatory Visit (INDEPENDENT_AMBULATORY_CARE_PROVIDER_SITE_OTHER): Payer: Self-pay | Admitting: Internal Medicine

## 2013-12-06 NOTE — Telephone Encounter (Signed)
Per Dr.Rehman may fill with no refills. 

## 2013-12-19 ENCOUNTER — Encounter: Payer: Self-pay | Admitting: Cardiovascular Disease

## 2013-12-19 ENCOUNTER — Ambulatory Visit (INDEPENDENT_AMBULATORY_CARE_PROVIDER_SITE_OTHER): Payer: Medicare Other | Admitting: Cardiovascular Disease

## 2013-12-19 ENCOUNTER — Encounter (INDEPENDENT_AMBULATORY_CARE_PROVIDER_SITE_OTHER): Payer: Self-pay

## 2013-12-19 VITALS — BP 122/60 | HR 43 | Ht 59.0 in | Wt 124.0 lb

## 2013-12-19 DIAGNOSIS — R079 Chest pain, unspecified: Secondary | ICD-10-CM

## 2013-12-19 DIAGNOSIS — R001 Bradycardia, unspecified: Secondary | ICD-10-CM

## 2013-12-19 DIAGNOSIS — G909 Disorder of the autonomic nervous system, unspecified: Secondary | ICD-10-CM

## 2013-12-19 DIAGNOSIS — G908 Other disorders of autonomic nervous system: Secondary | ICD-10-CM

## 2013-12-19 DIAGNOSIS — G9089 Other disorders of autonomic nervous system: Secondary | ICD-10-CM

## 2013-12-19 DIAGNOSIS — I498 Other specified cardiac arrhythmias: Secondary | ICD-10-CM

## 2013-12-19 DIAGNOSIS — I2510 Atherosclerotic heart disease of native coronary artery without angina pectoris: Secondary | ICD-10-CM

## 2013-12-19 NOTE — Progress Notes (Signed)
Patient ID: [name], female   DOB: 06/04/1941, 73 y.o.   MRN: [id number]      SUBJECTIVE: The patient is here to followup for a chest pain syndrome and a history of asymptomatic sinus bradycardia. She denies chest pain, palpitations, dizziness, leg swelling and syncope.    Allergies  Allergen Reactions  . Dronabinol Nausea And Vomiting and Palpitations    Patient also states that she had palpatations  . Cefuroxime Axetil Nausea And Vomiting  . Codeine Nausea And Vomiting  . Diltiazem Nausea And Vomiting  . Morphine Other (See Comments)    "it will kill me" "made me stop breathing"  . Ondansetron Hives  . Shellfish Allergy Other (See Comments)    Blood sugar drops  . Aspirin Other (See Comments)    Gi bleed   . Penicillins Rash    Current Outpatient Prescriptions  Medication Sig Dispense Refill  . ALPRAZolam (XANAX XR) 1 MG 24 hr tablet Take 0.5-1 mg by mouth at bedtime as needed for sleep (Anxiety).       . Calcium Carbonate-Vitamin D (CALCIUM + D PO) Take 1 tablet by mouth daily. TAKING 1,000 CALCIUM AND 2,000 VIT D      . clopidogrel (PLAVIX) 75 MG tablet Take 75 mg by mouth daily.        . Cyanocobalamin (VITAMIN B 12 PO) Take 2,000 mcg by mouth daily.      Marland Kitchen dexlansoprazole (DEXILANT) 60 MG capsule Take 1 capsule (60 mg total) by mouth daily.  90 capsule  3  . docusate sodium (COLACE) 100 MG capsule Take 100 mg by mouth as needed for constipation.      . enalapril (VASOTEC) 5 MG tablet Take 5 mg by mouth daily.      . famotidine (PEPCID) 20 MG tablet Take 2 tablets (40 mg total) by mouth at bedtime.  90 tablet  3  . fentaNYL (DURAGESIC - DOSED MCG/HR) 50 MCG/HR Place 50 mcg onto the skin every 3 (three) days.      . ferrous sulfate 325 (65 FE) MG EC tablet Take 1 tablet (325 mg total) by mouth 3 (three) times daily with meals.    3  . metFORMIN (GLUCOPHAGE) 1000 MG tablet Take 1 tablet (1,000 mg total) by mouth daily. HOLD for 2 days, resume on 04/22/2013.  30  tablet  1  . metoCLOPramide (REGLAN) 10 MG tablet TAKE ONE TABLET BY MOUTH THREE TIMES DAILY BEFORE MEALS.  90 tablet  0  . MISC NATURAL PRODUCTS ER PO Take 1-3 capsules by mouth daily. Digestive Blend Softgels      . mometasone (NASONEX) 50 MCG/ACT nasal spray Place 2 sprays into the nose daily as needed (Allergies).       . Multiple Vitamins-Minerals (MULTIVITAMINS THER. W/MINERALS) TABS tablet Take 1 tablet by mouth daily.      . promethazine (PHENERGAN) 12.5 MG tablet Take 1 tablet (12.5 mg total) by mouth every 8 (eight) hours as needed for nausea.  20 tablet  0  . ranolazine (RANEXA) 500 MG 12 hr tablet Take 1 tablet (500 mg total) by mouth 2 (two) times daily.  60 tablet  3  . silodosin (RAPAFLO) 8 MG CAPS capsule Take 8 mg by mouth daily with breakfast.      . temazepam (RESTORIL) 30 MG capsule Take 30 mg by mouth at bedtime as needed for sleep.      Marland Kitchen topiramate (TOPAMAX) 100 MG tablet Take 100 mg by mouth at bedtime.      Marland Kitchen  Travoprost, BAK Free, (TRAVATAN) 0.004 % SOLN ophthalmic solution Place 1 drop into both eyes at bedtime.       No current facility-administered medications for this visit.    Past Medical History  Diagnosis Date  . Nausea   . Bloating   . Dysphagia   . Gastroesophageal reflux   . Gastroparesis   . Anemia   . TIA (transient ischemic attack)     "3-4 before starting RX; none since" (04/19/2013)  . PONV (postoperative nausea and vomiting)   . Fibromyalgia   . Renal insufficiency   . Chronic neck pain   . Dizziness   . Chronic heartburn   . Chronic pain   . Anxiety   . Family history of anesthesia complication     "daughter has PONV too" (04/19/2013)  . Hypertension     "for diabetes; don't have high blood pressure" (04/19/2013)  . Type II diabetes mellitus   . Bleeding stomach ulcer 1980's  . History of blood transfusion 1980's    "w/bleeding stomach ulcer" (04/19/2013)  . Hiatal hernia     "had it before; had OR; got it again" (04/19/2013)  .  Chronic headaches   . Migraine     "take RX for it qd" (04/19/2013)  . DDD (degenerative disc disease), lumbar   . Arthritis     "all over" (04/19/2013)  . Chronic back pain   . Colon cancer   . Diabetic peripheral neuropathy     "in my feet" (04/19/2013)  . Walking pneumonia ~ 1966  . Pneumonia 04/2012  . Glaucoma, bilateral     Past Surgical History  Procedure Laterality Date  . Hemicolectomy  2010    ZIEGLER  . Colonoscopy  10/11/2010  . Colonoscopy  11/23/2009  . Colonoscopy  07/28/2008    W/SNARE  . Colonoscopy  06/28/07  . Colonoscopy  05/10/07    W/POLYP  . Colonoscopy  12/28/00  . Upper gastrointestinal endoscopy  10/11/2010    EGD ED  . Upper gastrointestinal endoscopy  11/23/2009  . Upper gastrointestinal endoscopy  05/10/07    EGD ED  . Upper gastrointestinal endoscopy  08/11/01    EGD ED  . Posterior fusion cervical spine  1985    "had a broken neck" (04/19/2013)  . Hiatal hernia repair  1970's  . Tonsillectomy  ~ 1953  . Colon surgery    . Cholecystectomy  1980's  . Anterior cervical discectomy  ~ 2009    "only cleaned out arthritis and spurs" (04/19/2013)  . Vaginal hysterectomy    . Cataract extraction w/ intraocular lens implant Left 04/13/2013  . Abdominal adhesion surgery  ~ 2012    "repaired wrap where they did hiatal hernia OR too" 911/04/2013)  . Colonoscopy with esophagogastroduodenoscopy (egd) N/A 05/13/2013    Procedure: COLONOSCOPY WITH ESOPHAGOGASTRODUODENOSCOPY (EGD);  Surgeon: Rogene Houston, MD;  Location: [facility];  Service: Endoscopy;  Laterality: N/A;  855    History   Social History  . Marital Status: Widowed    Spouse Name: N/A    Number of Children: N/A  . Years of Education: N/A   Occupational History  .     Social History Main Topics  . Smoking status: Former Smoker -- 1.50 packs/day for 15 years    Types: Cigarettes    Quit date: 03/17/1991  . Smokeless tobacco: Never Used     Comment: Patient states that it  has ben greater than 20 years since she quit smoking  .  Alcohol Use: No  . Drug Use: No  . Sexual Activity: No   Other Topics Concern  . Not on file   Social History Narrative   Patient lives at home alone.    Patient is retired.    Patient is widowed.    Patient has 4 children.   Patient has a 11th grade education.            Filed Vitals:   12/19/13 1310  BP: 122/60  Pulse: 43  Height: 4\' 11"  (1.499 m)  Weight: 124 lb (56.246 kg)  SpO2: 98%   HR recheck manually: 56 bpm  PHYSICAL EXAM General: NAD Neck: No JVD, no thyromegaly. Lungs: Clear to auscultation bilaterally with normal respiratory effort. CV: Nondisplaced PMI.  Regular rate and rhythm, normal S1/S2, no S3/S4, no murmur. No pretibial or periankle edema.  No carotid bruit.  Normal pedal pulses.  Abdomen: Soft, nontender, no hepatosplenomegaly, no distention.  Neurologic: Alert and oriented x 3.  Psych: Normal affect. Extremities: No clubbing or cyanosis.   ECG: reviewed and available in electronic records.    ASSESSMENT AND PLAN: 1. Chest pain: Symptomatically stable, and njot due to hemodynamically significant obstructive CAD. It is unclear what the etiology is, as she has had numerous tests which have been unrevealing. She appears to have a chest pain syndrome of which the etiologies include small vessel ischemic disease vs esophageal disease/spasm. At a prior visit, I prescribed Ranexa 500 mg bid, as the nitrates she took during a hospitalization did not alleviate her symptoms.  She is no longer complaining of chest pain.   2. Bradycardia/chronotropic incompetence: HR previously 68 bpm at office visit with Dr. Laural Golden in 10/2013, currently 56 bpm. Prior Holter monitoring showed normal sinus rhythm with very few PACs and sinus bradycardia with an average heart rate of 50 beats per minute. There were no long pauses detected. She has had no episodes of dizziness or syncope, and does have a longstanding history  of vertigo. Given her known h/o severe gastroparesis, she may very well have an autonomic neuropathy.   Dispo: f/u 4 months.    Kate Sable, M.D., F.A.C.C.

## 2013-12-19 NOTE — Patient Instructions (Signed)
Your physician wants you to follow-up in: 4 months You will receive a reminder letter in the mail two months in advance. If you don't receive a letter, please call our office to schedule the follow-up appointment.   Your physician recommends that you continue on your current medications as directed. Please refer to the Current Medication list given to you today.      Thank you for choosing [facility] !         

## 2014-01-17 ENCOUNTER — Other Ambulatory Visit (INDEPENDENT_AMBULATORY_CARE_PROVIDER_SITE_OTHER): Payer: Self-pay | Admitting: Internal Medicine

## 2014-02-07 ENCOUNTER — Ambulatory Visit (INDEPENDENT_AMBULATORY_CARE_PROVIDER_SITE_OTHER): Payer: Medicare Other | Admitting: Internal Medicine

## 2014-02-07 ENCOUNTER — Encounter (INDEPENDENT_AMBULATORY_CARE_PROVIDER_SITE_OTHER): Payer: Self-pay | Admitting: Internal Medicine

## 2014-02-07 VITALS — BP 112/70 | HR 68 | Temp 98.4°F | Resp 16 | Ht 61.0 in | Wt 123.4 lb

## 2014-02-07 DIAGNOSIS — K3184 Gastroparesis: Secondary | ICD-10-CM

## 2014-02-07 DIAGNOSIS — I2510 Atherosclerotic heart disease of native coronary artery without angina pectoris: Secondary | ICD-10-CM

## 2014-02-07 DIAGNOSIS — Z862 Personal history of diseases of the blood and blood-forming organs and certain disorders involving the immune mechanism: Secondary | ICD-10-CM

## 2014-02-07 DIAGNOSIS — R1013 Epigastric pain: Secondary | ICD-10-CM

## 2014-02-07 LAB — CBC
HCT: 36.1 % (ref 36.0–46.0)
Hemoglobin: 11.8 g/dL — ABNORMAL LOW (ref 12.0–15.0)
MCH: 29.1 pg (ref 26.0–34.0)
MCHC: 32.7 g/dL (ref 30.0–36.0)
MCV: 88.9 fL (ref 78.0–100.0)
Platelets: 221 10*3/uL (ref 150–400)
RBC: 4.06 MIL/uL (ref 3.87–5.11)
RDW: 13.9 % (ref 11.5–15.5)
WBC: 6.7 10*3/uL (ref 4.0–10.5)

## 2014-02-07 MED ORDER — SUCRALFATE 1 GM/10ML PO SUSP: 1.0000 g | Freq: Three times a day (TID) | ORAL | Status: DC

## 2014-02-07 MED ORDER — FERROUS SULFATE 325 (65 FE) MG PO TBEC: 325.0000 mg | DELAYED_RELEASE_TABLET | Freq: Every day | ORAL | Status: DC

## 2014-02-07 NOTE — Patient Instructions (Signed)
Take sucralfate 4 times a day for 2 weeks and thereafter on an as-needed basis. Physician will call with results of blood tests

## 2014-02-07 NOTE — Progress Notes (Signed)
Presenting complaint;  Epigastric pain. Followup for gastroparesis.  Subjective:  Patient is 73 year old Caucasian female with multiple medical problems who presents for scheduled visit. She was last seen on 10/24/2013. She states she has completed her evaluation at [facility] by  Dr .Derrill Kay. She had Botox injection to pylorus on 10/26/2013. She was advised to continue metoclopramide. She states she is not having any side effects. She has some nausea but has not experienced vomiting in at least 3 months. Today she is complaining of intermittent epigastric pain. This pain started a few days ago. She does not get any relief with meals. Similarly pain does not get worse with meals. She has been burping a lot. She denies melena or rectal bleeding. During her evaluation at [facility] she was switched to MOM because MiraLax was not working. She states her bowels move daily since she's been on the MOM. She has gained 4 pounds since her last visit. She is on Plavix but does not take other NSAIDs. She takes one iron pill daily and not three times a day.  Current Medications: Outpatient Encounter Prescriptions as of 02/07/2014  Medication Sig  . ALPRAZolam (XANAX XR) 1 MG 24 hr tablet Take 0.5-1 mg by mouth at bedtime as needed for sleep (Anxiety).   . Calcium Carbonate-Vitamin D (CALCIUM + D PO) Take 1 tablet by mouth daily. TAKING 1,000 CALCIUM AND 2,000 VIT D  . clopidogrel (PLAVIX) 75 MG tablet Take 75 mg by mouth daily.    . Cyanocobalamin (VITAMIN B 12 PO) Take 2,000 mcg by mouth daily.  Marland Kitchen dexlansoprazole (DEXILANT) 60 MG capsule Take 1 capsule (60 mg total) by mouth daily.  Marland Kitchen docusate sodium (COLACE) 100 MG capsule Take 100 mg by mouth as needed for constipation.  . enalapril (VASOTEC) 5 MG tablet Take 5 mg by mouth daily.  . famotidine (PEPCID) 20 MG tablet Take 2 tablets (40 mg total) by mouth at bedtime.  . fentaNYL (DURAGESIC - DOSED MCG/HR) 50 MCG/HR Place 50 mcg onto the skin every 3 (three) days.  .  ferrous sulfate 325 (65 FE) MG EC tablet Take 1 tablet (325 mg total) by mouth 3 (three) times daily with meals.  . metFORMIN (GLUCOPHAGE) 1000 MG tablet Take 1 tablet (1,000 mg total) by mouth daily. HOLD for 2 days, resume on 04/22/2013.  . metoCLOPramide (REGLAN) 10 MG tablet TAKE ONE TABLET BY MOUTH THREE TIMES DAILY BEFORE MEALS.  Marland Kitchen MISC NATURAL PRODUCTS ER PO Take 1-3 capsules by mouth daily. Digestive Blend Softgels  . mometasone (NASONEX) 50 MCG/ACT nasal spray Place 2 sprays into the nose daily as needed (Allergies).   . Multiple Vitamins-Minerals (MULTIVITAMINS THER. W/MINERALS) TABS tablet Take 1 tablet by mouth daily.  . promethazine (PHENERGAN) 12.5 MG tablet Take 1 tablet (12.5 mg total) by mouth every 8 (eight) hours as needed for nausea.  . ranolazine (RANEXA) 500 MG 12 hr tablet Take 1 tablet (500 mg total) by mouth 2 (two) times daily.  . silodosin (RAPAFLO) 8 MG CAPS capsule Take 8 mg by mouth daily with breakfast.  . temazepam (RESTORIL) 30 MG capsule Take 30 mg by mouth at bedtime as needed for sleep.  Marland Kitchen topiramate (TOPAMAX) 100 MG tablet Take 100 mg by mouth at bedtime.  . Travoprost, BAK Free, (TRAVATAN) 0.004 % SOLN ophthalmic solution Place 1 drop into both eyes at bedtime.     Objective: Blood pressure 112/70, pulse 68, temperature 98.4 F (36.9 C), temperature source Oral, resp. rate 16, height 5\' 1"  (1.549  m), weight 123 lb 6.4 oz (55.974 kg). Patient is alert and in no acute distress. Conjunctiva is pink. Sclera is nonicteric Oropharyngeal mucosa is normal. She does not have tremor or abnormal movement to her lips or tongue. No neck masses or thyromegaly noted. Cardiac exam with regular rhythm normal S1 and S2. No murmur or gallop noted. Lungs are clear to auscultation. Abdomen is symmetrical. No bruit noted. Abdomen is soft with mild epigastric tenderness. No organomegaly or masses.  No LE edema or clubbing noted. No tremors noted.  Labs/studies  Results: H&H was 12 and 36.2 on 10/28/2013.   Assessment:  #1. Epigastric pain. This pain is possibly due to gastric dysmotility or gastric distention. Last EGD was normal at [facility]. Gallbladder has previously been removed. If she does not respond to therapy will need to evaluate for pancreatic disease. #2. History of iron deficiency anemia possibly due to poor iron absorption in the setting of chronic PPI therapy. H&H over 3 months ago was normal. #3. Diabetic gastroparesis. She is on metoclopramide and not having any side effects. Patient is fully aware of potential problems with this medication.    Plan:  Patient will go to lab for CBC, LFTs and serum lipase. Sucralfate 1 g by mouth a.c. and each bedtime for two weeks and thereafter on as-needed basis. Discontinue Colace. Office visit in 4 months.      ]

## 2014-02-08 LAB — HEPATIC FUNCTION PANEL
ALT: 8 U/L (ref 0–35)
AST: 10 U/L (ref 0–37)
Albumin: 4.2 g/dL (ref 3.5–5.2)
Alkaline Phosphatase: 52 U/L (ref 39–117)
Bilirubin, Direct: <0.1 mg/dL (ref 0.0–0.3)
Total Bilirubin: 0.2 mg/dL (ref 0.2–1.2)
Total Protein: 6.7 g/dL (ref 6.0–8.3)

## 2014-02-08 LAB — LIPASE: Lipase: 28 U/L (ref 0–75)

## 2014-02-14 ENCOUNTER — Other Ambulatory Visit (INDEPENDENT_AMBULATORY_CARE_PROVIDER_SITE_OTHER): Payer: Self-pay | Admitting: Internal Medicine

## 2014-02-14 ENCOUNTER — Telehealth (INDEPENDENT_AMBULATORY_CARE_PROVIDER_SITE_OTHER): Payer: Self-pay | Admitting: *Deleted

## 2014-02-14 DIAGNOSIS — Z0189 Encounter for other specified special examinations: Secondary | ICD-10-CM

## 2014-02-14 DIAGNOSIS — R1013 Epigastric pain: Secondary | ICD-10-CM

## 2014-02-14 NOTE — Telephone Encounter (Signed)
Per Dr.Rehman the patient will need to have labs drawn. 

## 2014-02-15 LAB — CREATININE, SERUM: Creat: 0.78 mg/dL (ref 0.50–1.10)

## 2014-02-17 ENCOUNTER — Ambulatory Visit (HOSPITAL_COMMUNITY)
Admission: RE | Admit: 2014-02-17 | Discharge: 2014-02-17 | Disposition: A | Discharge status: Home / Self Care | Payer: Medicare Other | Source: Ambulatory Visit | Attending: Internal Medicine | Admitting: Internal Medicine

## 2014-02-17 ENCOUNTER — Telehealth (INDEPENDENT_AMBULATORY_CARE_PROVIDER_SITE_OTHER): Payer: Self-pay | Admitting: *Deleted

## 2014-02-17 DIAGNOSIS — K449 Diaphragmatic hernia without obstruction or gangrene: Secondary | ICD-10-CM | POA: Diagnosis not present

## 2014-02-17 DIAGNOSIS — K314 Gastric diverticulum: Secondary | ICD-10-CM | POA: Diagnosis not present

## 2014-02-17 DIAGNOSIS — R110 Nausea: Secondary | ICD-10-CM | POA: Diagnosis not present

## 2014-02-17 DIAGNOSIS — D3500 Benign neoplasm of unspecified adrenal gland: Secondary | ICD-10-CM | POA: Diagnosis not present

## 2014-02-17 DIAGNOSIS — R1013 Epigastric pain: Secondary | ICD-10-CM | POA: Diagnosis present

## 2014-02-17 DIAGNOSIS — K5730 Diverticulosis of large intestine without perforation or abscess without bleeding: Secondary | ICD-10-CM | POA: Diagnosis not present

## 2014-02-17 MED ORDER — IOHEXOL 300 MG/ML  SOLN
100.0000 mL | Freq: Once | INTRAMUSCULAR | Status: AC | PRN
  Administered 2014-02-17: 100 mL via INTRAVENOUS

## 2014-02-17 MED ORDER — SODIUM CHLORIDE 0.9 % IJ SOLN
INTRAMUSCULAR | Status: AC
  Filled 2014-02-17: qty 500

## 2014-02-17 MED ORDER — SODIUM CHLORIDE 0.9 % IJ SOLN
INTRAMUSCULAR | Status: AC
Start: 2014-02-17 — End: 2014-02-17
  Filled 2014-02-17: qty 45

## 2014-02-17 NOTE — Telephone Encounter (Signed)
We recd' a refill request for Metoclopramide 10 mg tablet                                              #90 Take one tablet by mouth three times daily before meals.  Per Dr.Rehman may fill with 2 additional refills. This was called to [facility].

## 2014-04-05 ENCOUNTER — Telehealth (INDEPENDENT_AMBULATORY_CARE_PROVIDER_SITE_OTHER): Payer: Self-pay | Admitting: *Deleted

## 2014-04-05 NOTE — Telephone Encounter (Signed)
Per Dr.Rehman - patient may be referred to Dr. Romona Curls as he has a special machine in his office for hemorrhoids. If patient does not want to see him , then she will need a referral to [facility]. We may call in Proto-foam 1% - Twice a day rectally. Dispense 10 grams, no refills/ This was called to the patient's Pharmacy. She was called and made aware.

## 2014-04-05 NOTE — Telephone Encounter (Signed)
[name] is having trouble with her hemorriods all the time. They bleed and she feel like they need to be removed. Her return phone number is [phone]. She will not be home until after 430 today.

## 2014-04-05 NOTE — Telephone Encounter (Signed)
The prescription was called to [facility]. A message was left on the patient's home phone. I ask that she call and speak to [facility], referral coordinator, to let her know if she wanted to be referred to [place] or to [facility].

## 2014-04-06 NOTE — Telephone Encounter (Signed)
Spoke to patient she wants appt w/ Dr Romona Curls -- appt sch'd 04/11/14 @ 1030 (1015), patient aware

## 2014-04-13 ENCOUNTER — Encounter: Payer: Self-pay | Admitting: Cardiovascular Disease

## 2014-04-13 ENCOUNTER — Ambulatory Visit (INDEPENDENT_AMBULATORY_CARE_PROVIDER_SITE_OTHER): Payer: Medicare Other | Admitting: Cardiovascular Disease

## 2014-04-13 VITALS — BP 140/72 | HR 54 | Ht 59.0 in | Wt 125.0 lb

## 2014-04-13 DIAGNOSIS — G909 Disorder of the autonomic nervous system, unspecified: Secondary | ICD-10-CM

## 2014-04-13 DIAGNOSIS — G908 Other disorders of autonomic nervous system: Secondary | ICD-10-CM

## 2014-04-13 DIAGNOSIS — I2510 Atherosclerotic heart disease of native coronary artery without angina pectoris: Secondary | ICD-10-CM

## 2014-04-13 DIAGNOSIS — R001 Bradycardia, unspecified: Secondary | ICD-10-CM

## 2014-04-13 DIAGNOSIS — R079 Chest pain, unspecified: Secondary | ICD-10-CM

## 2014-04-13 NOTE — Patient Instructions (Addendum)

## 2014-04-13 NOTE — Progress Notes (Signed)
Patient ID: [name], female   DOB: 09-Dec-1940, 73 y.o.   MRN: [id number]      SUBJECTIVE: The patient is here to followup for a chest pain syndrome and a history of asymptomatic sinus bradycardia. She once had an episode of mild chest pain which she attributes to indigestion. She denies lightheadedness, leg swelling, dizziness, and syncope. She struggles with chronic headaches due to a prior neck fracture. ECG performed in the office today demonstrates sinus bradycardia, heart rate 58 bpm, isolated PVC, and a nonspecific ST segment and T wave abnormality.   Review of Systems: As per "subjective", otherwise negative.  Allergies  Allergen Reactions  . Dronabinol Nausea And Vomiting and Palpitations    Patient also states that she had palpatations  . Cefuroxime Axetil Nausea And Vomiting  . Codeine Nausea And Vomiting  . Diltiazem Nausea And Vomiting  . Morphine Other (See Comments)    "it will kill me" "made me stop breathing"  . Ondansetron Hives  . Shellfish Allergy Other (See Comments)    Blood sugar drops  . Aspirin Other (See Comments)    Gi bleed   . Penicillins Rash    Current Outpatient Prescriptions  Medication Sig Dispense Refill  . ALPRAZolam (XANAX XR) 1 MG 24 hr tablet Take 0.5-1 mg by mouth at bedtime as needed for sleep (Anxiety).     . Calcium Carbonate-Vitamin D (CALCIUM + D PO) Take 1 tablet by mouth daily. TAKING 1,000 CALCIUM AND 2,000 VIT D    . clopidogrel (PLAVIX) 75 MG tablet Take 75 mg by mouth daily.      . Cyanocobalamin (VITAMIN B 12 PO) Take 2,000 mcg by mouth daily.    Marland Kitchen dexlansoprazole (DEXILANT) 60 MG capsule Take 1 capsule (60 mg total) by mouth daily. 90 capsule 3  . enalapril (VASOTEC) 5 MG tablet Take 5 mg by mouth daily.    . famotidine (PEPCID) 20 MG tablet Take 2 tablets (40 mg total) by mouth at bedtime. 90 tablet 3  . fentaNYL (DURAGESIC - DOSED MCG/HR) 50 MCG/HR Place 50 mcg onto the skin every 3 (three) days.    . ferrous  sulfate 325 (65 FE) MG EC tablet Take 1 tablet (325 mg total) by mouth daily with breakfast.  3  . metFORMIN (GLUCOPHAGE) 1000 MG tablet Take 1 tablet (1,000 mg total) by mouth daily. HOLD for 2 days, resume on 04/22/2013. 30 tablet 1  . metoCLOPramide (REGLAN) 10 MG tablet TAKE ONE TABLET BY MOUTH THREE TIMES DAILY BEFORE MEALS. 90 tablet 0  . MISC NATURAL PRODUCTS ER PO Take 1-3 capsules by mouth daily. Digestive Blend Softgels    . mometasone (NASONEX) 50 MCG/ACT nasal spray Place 2 sprays into the nose daily as needed (Allergies).     . Multiple Vitamins-Minerals (MULTIVITAMINS THER. W/MINERALS) TABS tablet Take 1 tablet by mouth daily.    . promethazine (PHENERGAN) 12.5 MG tablet Take 1 tablet (12.5 mg total) by mouth every 8 (eight) hours as needed for nausea. 20 tablet 0  . ranolazine (RANEXA) 500 MG 12 hr tablet Take 1 tablet (500 mg total) by mouth 2 (two) times daily. 60 tablet 3  . silodosin (RAPAFLO) 8 MG CAPS capsule Take 8 mg by mouth daily with breakfast.    . temazepam (RESTORIL) 30 MG capsule Take 30 mg by mouth at bedtime as needed for sleep.    Marland Kitchen topiramate (TOPAMAX) 100 MG tablet Take 100 mg by mouth at bedtime.    . Travoprost, BAK  Free, (TRAVATAN) 0.004 % SOLN ophthalmic solution Place 1 drop into both eyes at bedtime.     No current facility-administered medications for this visit.    Past Medical History  Diagnosis Date  . Nausea   . Bloating   . Dysphagia   . Gastroesophageal reflux   . Gastroparesis   . Anemia   . TIA (transient ischemic attack)     "3-4 before starting RX; none since" (04/19/2013)  . PONV (postoperative nausea and vomiting)   . Fibromyalgia   . Renal insufficiency   . Chronic neck pain   . Dizziness   . Chronic heartburn   . Chronic pain   . Anxiety   . Family history of anesthesia complication     "daughter has PONV too" (04/19/2013)  . Hypertension     "for diabetes; don't have high blood pressure" (04/19/2013)  . Type II diabetes  mellitus   . Bleeding stomach ulcer 1980's  . History of blood transfusion 1980's    "w/bleeding stomach ulcer" (04/19/2013)  . Hiatal hernia     "had it before; had OR; got it again" (04/19/2013)  . Chronic headaches   . Migraine     "take RX for it qd" (04/19/2013)  . DDD (degenerative disc disease), lumbar   . Arthritis     "all over" (04/19/2013)  . Chronic back pain   . Colon cancer   . Diabetic peripheral neuropathy     "in my feet" (04/19/2013)  . Walking pneumonia ~ 1966  . Pneumonia 04/2012  . Glaucoma, bilateral     Past Surgical History  Procedure Laterality Date  . Hemicolectomy  2010    ZIEGLER  . Colonoscopy  10/11/2010  . Colonoscopy  11/23/2009  . Colonoscopy  07/28/2008    W/SNARE  . Colonoscopy  06/28/07  . Colonoscopy  05/10/07    W/POLYP  . Colonoscopy  12/28/00  . Upper gastrointestinal endoscopy  10/11/2010    EGD ED  . Upper gastrointestinal endoscopy  11/23/2009  . Upper gastrointestinal endoscopy  05/10/07    EGD ED  . Upper gastrointestinal endoscopy  08/11/01    EGD ED  . Posterior fusion cervical spine  1985    "had a broken neck" (04/19/2013)  . Hiatal hernia repair  1970's  . Tonsillectomy  ~ 1953  . Colon surgery    . Cholecystectomy  1980's  . Anterior cervical discectomy  ~ 2009    "only cleaned out arthritis and spurs" (04/19/2013)  . Vaginal hysterectomy    . Cataract extraction w/ intraocular lens implant Left 04/13/2013  . Abdominal adhesion surgery  ~ 2012    "repaired wrap where they did hiatal hernia OR too" 911/04/2013)  . Colonoscopy with esophagogastroduodenoscopy (egd) N/A 05/13/2013    Procedure: COLONOSCOPY WITH ESOPHAGOGASTRODUODENOSCOPY (EGD);  Surgeon: Rogene Houston, MD;  Location: [facility];  Service: Endoscopy;  Laterality: N/A;  855    History   Social History  . Marital Status: Widowed    Spouse Name: N/A    Number of Children: N/A  . Years of Education: N/A   Occupational History  .     Social  History Main Topics  . Smoking status: Former Smoker -- 1.50 packs/day for 15 years    Types: Cigarettes    Quit date: 03/17/1991  . Smokeless tobacco: Never Used     Comment: Patient states that it has ben greater than 20 years since she quit smoking  . Alcohol Use: No  .  Drug Use: No  . Sexual Activity: No   Other Topics Concern  . Not on file   Social History Narrative   Patient lives at home alone.    Patient is retired.    Patient is widowed.    Patient has 4 children.   Patient has a 11th grade education.            Filed Vitals:   04/13/14 1039  BP: 140/72  Pulse: 54  Height: 4\' 11"  (1.499 m)  Weight: 125 lb (56.7 kg)    PHYSICAL EXAM General: NAD HEENT: Normal. Neck: No JVD, no thyromegaly. Lungs: Clear to auscultation bilaterally with normal respiratory effort. CV: Nondisplaced PMI.  Regular rate and rhythm, normal S1/S2, no S3/S4, no murmur. No pretibial or periankle edema.  No carotid bruit.  Normal pedal pulses.  Abdomen: Soft, nontender, no hepatosplenomegaly, no distention.  Neurologic: Alert and oriented x 3.  Psych: Normal affect. Skin: Normal. Musculoskeletal: Normal range of motion, no gross deformities. Extremities: No clubbing or cyanosis.   ECG: Most recent ECG reviewed.      ASSESSMENT AND PLAN: 1. Chest pain: Symptomatically stable, and not due to hemodynamically significant obstructive CAD. It is unclear what the etiology is, as she has had numerous tests which have been unrevealing. She appears to have a chest pain syndrome of which the etiologies include small vessel ischemic disease vs esophageal disease/spasm. At a prior visit, I prescribed Ranexa 500 mg bid, as the nitrates she took during a hospitalization did not alleviate her symptoms.  She has not had any recurrence of significant chest pain.   2. Bradycardia/chronotropic incompetence: HR previously 68 bpm at office visit with Dr. Laural Golden in 10/2013, currently 54 bpm. Prior Holter  monitoring showed normal sinus rhythm with very few PACs and sinus bradycardia with an average heart rate of 50 beats per minute. There were no long pauses detected. She has had no episodes of dizziness or syncope, and does have a longstanding history of vertigo. Given her known h/o severe gastroparesis, she may very well have an autonomic neuropathy.   Dispo: f/u 6 months.    Kate Sable, M.D., F.A.C.C.

## 2014-04-19 NOTE — Consult Note (Signed)
NAMEKISSIE, ZIOLKOWSKI               ACCOUNT NO.:  [id number]  MEDICAL RECORD NO.:  [id number]  LOCATION:  RAD                           FACILITY:  [facility]  PHYSICIAN:  Felicie Morn, M.D. DATE OF BIRTH:  March 08, 1941  DATE OF CONSULTATION: DATE OF DISCHARGE:  02/17/2014                                CONSULTATION   Hildred Laser, MD  Dear Bernadene Person,  I saw Ms. Swatek in my office on April 18, 2014, at which time I evaluated her perianal area as she was referred for hemorrhoids.  I reviewed her history, as you know, she has a history of colon cancer for which she underwent resection in 2010.  She also had gastric surgery and has been treated recently for gastroparesis at [facility].  I performed anoscopy on her after examination of her anus.  Her anal examination revealed a patulous external sphincter with multiple skin tags and very poor sphincter tone.  Her stools are very loose on the new regimen that she was placed at [facility] and I advised her to do this to treat her constipation with this regimen every other day as basically her stools are far too loose at present.  Anoscopy revealed that she had some grade 2 internal hemorrhoids, basically her main problem is that of rectal prolapse.  I could treat these hemorrhoids with an Ultroid and I will evaluate her in a month's time to see if that is still an option. But her real problem is not internal hemorrhoids, but a rectal prolapse due to poor sphincter tone likely do from her multiparous state as she has had 4 children and her perineal tone is quite lax.  Thank you for your confidence of sending this patient my way.  I will keep you informed if there are any changes.  While she was at my office I removed a pigmented lesion on her upper chest under local anesthesia.  I will make sure you receive a copy of her path report.  Thank you for referral.     Felicie Morn, M.D.     WB/MEDQ  D:  04/18/2014  T:  04/18/2014   Job:  [id number]

## 2014-05-11 ENCOUNTER — Encounter (INDEPENDENT_AMBULATORY_CARE_PROVIDER_SITE_OTHER): Payer: Self-pay | Admitting: *Deleted

## 2014-05-18 ENCOUNTER — Encounter (HOSPITAL_COMMUNITY): Payer: Self-pay | Admitting: Cardiology

## 2014-05-22 ENCOUNTER — Other Ambulatory Visit (INDEPENDENT_AMBULATORY_CARE_PROVIDER_SITE_OTHER): Payer: Self-pay | Admitting: Internal Medicine

## 2014-06-05 ENCOUNTER — Encounter (INDEPENDENT_AMBULATORY_CARE_PROVIDER_SITE_OTHER): Payer: Self-pay

## 2014-06-07 ENCOUNTER — Telehealth: Payer: Self-pay | Admitting: *Deleted

## 2014-06-07 MED ORDER — RANOLAZINE ER 500 MG PO TB12: 500.0000 mg | ORAL_TABLET | Freq: Two times a day (BID) | ORAL | Status: DC

## 2014-06-07 NOTE — Telephone Encounter (Signed)
Pt needs renexa to the [place] in [place]. IX#[id number] [phone]

## 2014-06-07 NOTE — Telephone Encounter (Signed)
Refilled ranexa to [facility] by mail per instructions by [facility] phone number given by pt

## 2014-06-12 ENCOUNTER — Ambulatory Visit (INDEPENDENT_AMBULATORY_CARE_PROVIDER_SITE_OTHER): Payer: Medicare Other | Admitting: Internal Medicine

## 2014-06-26 ENCOUNTER — Encounter (INDEPENDENT_AMBULATORY_CARE_PROVIDER_SITE_OTHER): Payer: Self-pay | Admitting: Internal Medicine

## 2014-06-26 ENCOUNTER — Ambulatory Visit (INDEPENDENT_AMBULATORY_CARE_PROVIDER_SITE_OTHER): Payer: Medicare Other | Admitting: Internal Medicine

## 2014-06-26 VITALS — BP 110/68 | HR 72 | Temp 97.5°F | Resp 18 | Ht 61.0 in | Wt 126.2 lb

## 2014-06-26 DIAGNOSIS — Z862 Personal history of diseases of the blood and blood-forming organs and certain disorders involving the immune mechanism: Secondary | ICD-10-CM

## 2014-06-26 DIAGNOSIS — Z85038 Personal history of other malignant neoplasm of large intestine: Secondary | ICD-10-CM

## 2014-06-26 DIAGNOSIS — K3184 Gastroparesis: Secondary | ICD-10-CM

## 2014-06-26 MED ORDER — METOCLOPRAMIDE HCL 10 MG PO TABS: 10.0000 mg | ORAL_TABLET | Freq: Three times a day (TID) | ORAL | Status: DC

## 2014-06-26 NOTE — Patient Instructions (Signed)
Let us know if you have difficulty contacting Dr. Alyson Ingles nurse. Notify if you have tremors or if you believe you are having side effects with metoclopramide.

## 2014-06-26 NOTE — Progress Notes (Signed)
Presenting complaint;  Follow-up for gastroparesis and iron deficiency anemia.  Subjective:  Patient is 74 year old Caucasian female presents for scheduled visit. She was last seen on 02/07/2014. For the last 1 week she has noted early satiety and postprandial fullness she's had nausea and heartburn. She believes she will need Botox injection to her pylorus again. Last treatment was in May, 2015 at [facility]. Her bowels move at least 3 times a week. She denies melena or frank rectal bleeding. She has intermittent hematochezia. She seen Dr. Romona Curls and has had one session of ultroid therapy. She has a follow-up visit tomorrow. He denies dysphagia. She is on Duragesic for chronic neck pain.  Current Medications: Outpatient Encounter Prescriptions as of 06/26/2014  Medication Sig  . ALPRAZolam (XANAX XR) 1 MG 24 hr tablet Take 0.5-1 mg by mouth as needed for sleep (Anxiety).   . Calcium Carbonate-Vitamin D (CALCIUM + D PO) Take 1 tablet by mouth daily. TAKING 1,000 CALCIUM AND 2,000 VIT D  . clopidogrel (PLAVIX) 75 MG tablet Take 75 mg by mouth daily.    . Cyanocobalamin (VITAMIN B 12 PO) Take 2,000 mcg by mouth daily.  Marland Kitchen dexlansoprazole (DEXILANT) 60 MG capsule Take 1 capsule (60 mg total) by mouth daily.  . famotidine (PEPCID) 20 MG tablet Take 2 tablets (40 mg total) by mouth at bedtime.  . fentaNYL (DURAGESIC - DOSED MCG/HR) 50 MCG/HR Place 50 mcg onto the skin every 3 (three) days.  . ferrous sulfate 325 (65 FE) MG EC tablet Take 1 tablet (325 mg total) by mouth daily with breakfast.  . metFORMIN (GLUCOPHAGE) 1000 MG tablet Take 1 tablet (1,000 mg total) by mouth daily. HOLD for 2 days, resume on 04/22/2013.  . metoCLOPramide (REGLAN) 10 MG tablet TAKE ONE TABLET BY MOUTH THREE TIMES DAILY BEFORE MEALS.  Marland Kitchen MISC NATURAL PRODUCTS ER PO Take 1-3 capsules by mouth daily. Digestive Blend Softgels  . mometasone (NASONEX) 50 MCG/ACT nasal spray Place 2 sprays into the nose daily as needed (Allergies).    . Multiple Vitamins-Minerals (MULTIVITAMINS THER. W/MINERALS) TABS tablet Take 1 tablet by mouth daily.  . promethazine (PHENERGAN) 12.5 MG tablet Take 1 tablet (12.5 mg total) by mouth every 8 (eight) hours as needed for nausea.  . ranolazine (RANEXA) 500 MG 12 hr tablet Take 1 tablet (500 mg total) by mouth 2 (two) times daily.  . tamsulosin (FLOMAX) 0.4 MG CAPS capsule Take 0.4 mg by mouth every 12 (twelve) hours.  . topiramate (TOPAMAX) 100 MG tablet Take 100 mg by mouth at bedtime.  . Travoprost, BAK Free, (TRAVATAN) 0.004 % SOLN ophthalmic solution Place 1 drop into both eyes at bedtime.  . [DISCONTINUED] enalapril (VASOTEC) 5 MG tablet Take 5 mg by mouth daily.  . [DISCONTINUED] silodosin (RAPAFLO) 8 MG CAPS capsule Take 8 mg by mouth daily with breakfast.  . [DISCONTINUED] temazepam (RESTORIL) 30 MG capsule Take 30 mg by mouth at bedtime as needed for sleep.     Objective: Blood pressure 110/68, pulse 72, temperature 97.5 F (36.4 C), temperature source Oral, resp. rate 18, height 5\' 1"  (1.549 m), weight 126 lb 3.2 oz (57.244 kg). Patient is alert and in no acute distress. Conjunctiva is pink. Sclera is nonicteric. She does not have abnormal movements weren't absurd time. Oropharyngeal mucosa is normal. No neck masses or thyromegaly noted. Cardiac exam with regular rhythm normal S1 and S2. No murmur or gallop noted. Lungs are clear to auscultation. Abdomen is soft with mild mid epigastric tenderness. No organomegaly  or masses. No LE edema or clubbing noted.  Labs/studies Results: Lab data from 02/07/2014  CBC 6.7, H&H 11.8 and 36.1 platelet count 221K and MCV 88.9   Assessment:  #1. Diabetic gastroparesis. She had extensive evaluation at [facility] by Dr. Derrill Kay last year and responded to Botox injection therapy to pylorus. Symptoms of relapse and she may need retreatment. She is not having any untoward effect with metoclopramide. #2. History of iron deficiency anemia possibly due  to impaired iron absorption. H&H back in September 2015 was just below normal. #3. History of colon carcinoma. Last colonoscopy was in December 2014. Review in December 2017. #4. History of achalasia. Status post Heller's myotomy at [facility] in March 2013.   Plan:  Patient advised to contact Dr. Alyson Ingles office. If she has any difficulty she will let us know. New prescription given for metoclopramide 10 mg to 70 doses with 3 refills.  Once again patient informed of potential side effects and if she is having any she should stop the medication and let us know. Office visit in 6 months.

## 2014-07-20 DIAGNOSIS — Z6825 Body mass index (BMI) 25.0-25.9, adult: Secondary | ICD-10-CM | POA: Insufficient documentation

## 2014-08-02 ENCOUNTER — Ambulatory Visit (INDEPENDENT_AMBULATORY_CARE_PROVIDER_SITE_OTHER): Payer: Medicare Other | Admitting: Diagnostic Neuroimaging

## 2014-08-02 ENCOUNTER — Encounter: Payer: Self-pay | Admitting: Diagnostic Neuroimaging

## 2014-08-02 VITALS — BP 122/73 | HR 51 | Ht 59.0 in | Wt 125.6 lb

## 2014-08-02 DIAGNOSIS — R482 Apraxia: Secondary | ICD-10-CM

## 2014-08-02 DIAGNOSIS — H02403 Unspecified ptosis of bilateral eyelids: Secondary | ICD-10-CM

## 2014-08-02 NOTE — Patient Instructions (Signed)
I will check MRI brain and lab testing.  Monitor symptoms. Ask family to record video clip if eyelid drooping happens again.

## 2014-08-02 NOTE — Progress Notes (Signed)
[facility]  PATIENT: [name] DOB: [birth year]  REFERRING CLINICIAN: Saintclair Halsted  HISTORY FROM: patient  REASON FOR VISIT: new consult    HISTORICAL  CHIEF COMPLAINT:  Chief Complaint  Patient presents with  . New Evaluation    staying tired, problems with my eyes     HISTORY OF PRESENT ILLNESS:   74 year old right-handed female here for evaluation of eyelid weakness.  For past 3 months patient has had episodes where her "eyes feel tired" and her eyelids started to droop down and fully close. She's had several episodes where she has had to pull her car over because she cannot see due to inability to open her eyes.   Patient had been evaluated by neurosurgery for neck pain, and was doing fairly well with occipital nerve block injections for headaches. Patient mentioned her eyelid weakness problem and therefore patient was referred to me for consideration of blepharospasm as a possible diagnosis.  Patient family note that she's been having some memory loss, repeating stories, as well as more headaches. Patient has photophobia and phonophobia with headaches. Headaches affect frontal and occipital regions.   REVIEW OF SYSTEMS: Full 14 system review of systems performed and notable only for fatigue hearing loss trouble swallowing constipation urination problems joint pain cramps aching muscles allergies runny nose anemia easy bruising memory loss headache difficulty swallowing insomnia sleepiness anxiety not asleep decreased energy change in appetite disinterest in activities.   ALLERGIES: Allergies  Allergen Reactions  . Dronabinol Nausea And Vomiting and Palpitations    Patient also states that she had palpatations  . Cefuroxime Axetil Nausea And Vomiting  . Codeine Nausea And Vomiting  . Diltiazem Nausea And Vomiting  . Morphine Other (See Comments)    "it will kill me" "made me stop breathing"  . Ondansetron Hives  . Shellfish Allergy Other (See  Comments)    Blood sugar drops  . Aspirin Other (See Comments)    Gi bleed   . Penicillins Rash    HOME MEDICATIONS: Outpatient Prescriptions Prior to Visit  Medication Sig Dispense Refill  . ALPRAZolam (XANAX XR) 1 MG 24 hr tablet Take 0.5-1 mg by mouth as needed for sleep (Anxiety).     . Calcium Carbonate-Vitamin D (CALCIUM + D PO) Take 1 tablet by mouth daily. TAKING 1,000 CALCIUM AND 2,000 VIT D    . clopidogrel (PLAVIX) 75 MG tablet Take 75 mg by mouth daily.      . Cyanocobalamin (VITAMIN B 12 PO) Take 2,000 mcg by mouth daily.    . famotidine (PEPCID) 20 MG tablet Take 2 tablets (40 mg total) by mouth at bedtime. 90 tablet 3  . fentaNYL (DURAGESIC - DOSED MCG/HR) 50 MCG/HR Place 50 mcg onto the skin every 3 (three) days.    . metFORMIN (GLUCOPHAGE) 1000 MG tablet Take 1 tablet (1,000 mg total) by mouth daily. HOLD for 2 days, resume on 04/22/2013. 30 tablet 1  . MISC NATURAL PRODUCTS ER PO Take 1-3 capsules by mouth daily. Digestive Blend Softgels    . mometasone (NASONEX) 50 MCG/ACT nasal spray Place 2 sprays into the nose daily as needed (Allergies).     . Multiple Vitamins-Minerals (MULTIVITAMINS THER. W/MINERALS) TABS tablet Take 1 tablet by mouth daily.    . promethazine (PHENERGAN) 12.5 MG tablet Take 1 tablet (12.5 mg total) by mouth every 8 (eight) hours as needed for nausea. 20 tablet 0  . tamsulosin (FLOMAX) 0.4 MG CAPS capsule Take 0.4 mg by mouth  every 12 (twelve) hours.    . topiramate (TOPAMAX) 100 MG tablet Take 100 mg by mouth at bedtime.    . Travoprost, BAK Free, (TRAVATAN) 0.004 % SOLN ophthalmic solution Place 1 drop into both eyes at bedtime.    Marland Kitchen dexlansoprazole (DEXILANT) 60 MG capsule Take 1 capsule (60 mg total) by mouth daily. 90 capsule 3  . ferrous sulfate 325 (65 FE) MG EC tablet Take 1 tablet (325 mg total) by mouth daily with breakfast.  3  . metoCLOPramide (REGLAN) 10 MG tablet Take 1 tablet (10 mg total) by mouth 3 (three) times daily before meals.  270 tablet 3  . ranolazine (RANEXA) 500 MG 12 hr tablet Take 1 tablet (500 mg total) by mouth 2 (two) times daily. 60 tablet 3   No facility-administered medications prior to visit.    PAST MEDICAL HISTORY: Past Medical History  Diagnosis Date  . Nausea   . Bloating   . Dysphagia   . Gastroesophageal reflux   . Gastroparesis   . Anemia   . TIA (transient ischemic attack)     "3-4 before starting RX; none since" (04/19/2013)  . PONV (postoperative nausea and vomiting)   . Fibromyalgia   . Renal insufficiency   . Chronic neck pain   . Dizziness   . Chronic heartburn   . Chronic pain   . Anxiety   . Family history of anesthesia complication     "daughter has PONV too" (04/19/2013)  . Hypertension     "for diabetes; don't have high blood pressure" (04/19/2013)  . Type II diabetes mellitus   . Bleeding stomach ulcer 1980's  . History of blood transfusion 1980's    "w/bleeding stomach ulcer" (04/19/2013)  . Hiatal hernia     "had it before; had OR; got it again" (04/19/2013)  . Chronic headaches   . Migraine     "take RX for it qd" (04/19/2013)  . DDD (degenerative disc disease), lumbar   . Arthritis     "all over" (04/19/2013)  . Chronic back pain   . Colon cancer   . Diabetic peripheral neuropathy     "in my feet" (04/19/2013)  . Walking pneumonia ~ 1966  . Pneumonia 04/2012  . Glaucoma, bilateral     PAST SURGICAL HISTORY: Past Surgical History  Procedure Laterality Date  . Hemicolectomy  2010    ZIEGLER  . Colonoscopy  10/11/2010  . Colonoscopy  11/23/2009  . Colonoscopy  07/28/2008    W/SNARE  . Colonoscopy  06/28/07  . Colonoscopy  05/10/07    W/POLYP  . Colonoscopy  12/28/00  . Upper gastrointestinal endoscopy  10/11/2010    EGD ED  . Upper gastrointestinal endoscopy  11/23/2009  . Upper gastrointestinal endoscopy  05/10/07    EGD ED  . Upper gastrointestinal endoscopy  08/11/01    EGD ED  . Posterior fusion cervical spine  1985    "had a broken  neck" (04/19/2013)  . Hiatal hernia repair  1970's  . Tonsillectomy  ~ 1953  . Colon surgery    . Cholecystectomy  1980's  . Anterior cervical discectomy  ~ 2009    "only cleaned out arthritis and spurs" (04/19/2013)  . Vaginal hysterectomy    . Cataract extraction w/ intraocular lens implant Left 04/13/2013  . Abdominal adhesion surgery  ~ 2012    "repaired wrap where they did hiatal hernia OR too" 911/04/2013)  . Colonoscopy with esophagogastroduodenoscopy (egd) N/A 05/13/2013    Procedure: COLONOSCOPY WITH  ESOPHAGOGASTRODUODENOSCOPY (EGD);  Surgeon: Rogene Houston, MD;  Location: [facility]ITE;  Service: Endoscopy;  Laterality: N/A;  855  . Left heart catheterization with coronary angiogram N/A 04/19/2013    Procedure: LEFT HEART CATHETERIZATION WITH CORONARY ANGIOGRAM;  Surgeon: Peter M Martinique, MD;  Location: [facility] CATH LAB;  Service: Cardiovascular;  Laterality: N/A;    FAMILY HISTORY: Family History  Problem Relation Age of Onset  . Heart disease Mother   . Diabetes Mother   . Dementia Father   . Healthy Sister   . Diabetes Brother   . Kidney cancer Brother   . Diabetes Brother   . Neuropathy Brother   . Diabetes Daughter   . Diabetes Daughter   . Diabetes Son     SOCIAL HISTORY:  History   Social History  . Marital Status: Widowed    Spouse Name: N/A  . Number of Children: N/A  . Years of Education: N/A   Occupational History  .     Social History Main Topics  . Smoking status: Former Smoker -- 1.50 packs/day for 15 years    Types: Cigarettes    Quit date: 03/17/1991  . Smokeless tobacco: Never Used     Comment: Patient states that it has ben greater than 20 years since she quit smoking  . Alcohol Use: No  . Drug Use: No  . Sexual Activity: No   Other Topics Concern  . Not on file   Social History Narrative   Patient lives at home alone.    Patient is retired.    Patient is widowed.    Patient has 4 children.   Patient has a 11th grade education.             PHYSICAL EXAM  Filed Vitals:   08/02/14 1016  BP: 122/73  Pulse: 51  Height: 4\' 11"  (1.499 m)  Weight: 125 lb 9.6 oz (56.972 kg)    Body mass index is 25.35 kg/(m^2).   Visual Acuity Screening   Right eye Left eye Both eyes  Without correction:     With correction: 20/20 20/30     MMSE - Mini Mental State Exam 08/02/2014  Orientation to time 5  Orientation to Place 4  Registration 3  Attention/ Calculation 0  Recall 2  Language- name 2 objects 2  Language- repeat 1  Language- follow 3 step command 2  Language- read & follow direction 1  Write a sentence 1  Copy design 0  Total score 21    GENERAL EXAM: Patient is in no distress; well developed, nourished and groomed; neck is supple  CARDIOVASCULAR: Regular rate and rhythm, no murmurs, no carotid bruits  NEUROLOGIC: MENTAL STATUS: awake, alert, oriented to person, place and time, recent and remote memory intact, normal attention and concentration, language fluent, comprehension intact, naming intact, fund of knowledge appropriate; POSITIVE SNOUT AND MYERSON'S REFLEXES CRANIAL NERVE: no papilledema on fundoscopic exam, pupils equal and reactive to light, visual fields full to confrontation, extraocular muscles intact, no nystagmus, facial sensation and strength symmetric, hearing intact, palate elevates symmetrically, uvula midline, shoulder shrug symmetric, tongue midline. NO PTOSIS OR DOUBLE VISION AFTER 1 MINUTE UPGAZE MOTOR: normal bulk and tone, full strength in the BUE, BLE SENSORY: normal and symmetric to light touch, temperature, vibration COORDINATION: finger-nose-finger, fine finger movements normal REFLEXES: deep tendon reflexes present and symmetric; TRACE AT ANKLES GAIT/STATION: narrow based gait; USES SINGLE POINT CANE; SLIGHTLY UNSTEADY; romberg is negative    DIAGNOSTIC DATA (LABS, IMAGING,  TESTING) - I reviewed patient records, labs, notes, testing and imaging myself where  available.  Lab Results  Component Value Date   WBC 6.7 02/07/2014   HGB 11.8* 02/07/2014   HCT 36.1 02/07/2014   MCV 88.9 02/07/2014   PLT 221 02/07/2014      Component Value Date/Time   NA 141 04/19/2013 1101   NA 139 07/01/2010   K 3.5 04/19/2013 1101   CL 107 04/19/2013 1101   CO2 24 04/19/2013 1101   GLUCOSE 119* 04/19/2013 1101   BUN 6 04/19/2013 1101   CREATININE 0.78 02/14/2014 1016   CREATININE 0.79 04/19/2013 1101   CALCIUM 9.7 04/19/2013 1101   PROT 6.7 02/07/2014 1554   ALBUMIN 4.2 02/07/2014 1554   AST 10 02/07/2014 1554   ALT 8 02/07/2014 1554   ALKPHOS 52 02/07/2014 1554   BILITOT 0.2 02/07/2014 1554   GFRNONAA 82* 04/19/2013 1101   GFRAA >90 04/19/2013 1101   No results found for: CHOL, HDL, LDLCALC, LDLDIRECT, TRIG, CHOLHDL No results found for: HGBA1C Lab Results  Component Value Date   VITAMINB12 1979* 05/02/2012   Lab Results  Component Value Date   TSH 1.801 01/18/2013    I reviewed images myself and agree with interpretation. -VRP  03/02/13 MRI brain  1. Progression of periventricular and subcortical white matter disease without evidence for acute infarction. This likely reflects the sequela of chronic microvascular ischemia. 2. Postoperative changes of suboccipital craniotomy.    ASSESSMENT AND PLAN  74 y.o. year old female here with progressive intermittent eye opening difficulty.   I do not see evidence of fatigable weakness to suggest neuromuscular junction disorder such as myasthenia gravis. Patient does not give symptoms of forced eyelid closure or eyelid spasms to suggest blepharospasm or hemifacial spasm.  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 Her Mini-Mental Status exam score is 21 out of 30 and she has frontal release signs. She did not have her eye opening problem during our office visit today.  Ddx: eyelid opening apraxia due to neurodegenerative  disorder vs ocular myasthenia gravis vs insomnia associated fatigue   PLAN:  Orders Placed This Encounter  Procedures  . MR Brain Wo Contrast  . Acetylcholine Receptor, Binding   Return in about 3 months (around 10/31/2014).    Penni Bombard, MD 08/07/6008, 93:23 AM Certified in Neurology, Neurophysiology and Neuroimaging  [facility] [address] [place], [place] [address] [phone]

## 2014-08-03 LAB — ACETYLCHOLINE RECEPTOR, BINDING: AChR Binding Ab, Serum: <0.03 nmol/L (ref 0.00–0.24)

## 2014-08-08 ENCOUNTER — Ambulatory Visit (HOSPITAL_COMMUNITY)
Admission: RE | Admit: 2014-08-08 | Discharge: 2014-08-08 | Disposition: A | Discharge status: Home / Self Care | Payer: Medicare Other | Source: Ambulatory Visit | Attending: Diagnostic Neuroimaging | Admitting: Diagnostic Neuroimaging

## 2014-08-08 DIAGNOSIS — R482 Apraxia: Secondary | ICD-10-CM | POA: Diagnosis not present

## 2014-08-08 DIAGNOSIS — R413 Other amnesia: Secondary | ICD-10-CM | POA: Insufficient documentation

## 2014-08-08 DIAGNOSIS — H02403 Unspecified ptosis of bilateral eyelids: Secondary | ICD-10-CM

## 2014-08-08 DIAGNOSIS — I6782 Cerebral ischemia: Secondary | ICD-10-CM | POA: Insufficient documentation

## 2014-08-10 DIAGNOSIS — Z8673 Personal history of transient ischemic attack (TIA), and cerebral infarction without residual deficits: Secondary | ICD-10-CM | POA: Insufficient documentation

## 2014-10-03 ENCOUNTER — Other Ambulatory Visit: Payer: Self-pay | Admitting: Cardiovascular Disease

## 2014-10-03 NOTE — Telephone Encounter (Signed)
Please call in refill on Ranexa to [place] for 90 day supply / tg

## 2014-10-04 MED ORDER — RANOLAZINE ER 500 MG PO TB12: 500.0000 mg | ORAL_TABLET | Freq: Two times a day (BID) | ORAL | Status: DC

## 2014-10-04 NOTE — Telephone Encounter (Signed)
Refill completed.

## 2014-10-13 ENCOUNTER — Ambulatory Visit: Payer: Medicare Other | Admitting: Cardiovascular Disease

## 2014-10-18 ENCOUNTER — Other Ambulatory Visit (HOSPITAL_COMMUNITY): Payer: Self-pay | Admitting: Neurosurgery

## 2014-10-18 DIAGNOSIS — M5137 Other intervertebral disc degeneration, lumbosacral region: Secondary | ICD-10-CM

## 2014-10-27 ENCOUNTER — Ambulatory Visit (HOSPITAL_COMMUNITY)
Admission: RE | Admit: 2014-10-27 | Discharge: 2014-10-27 | Disposition: A | Discharge status: Home / Self Care | Payer: Medicare Other | Source: Ambulatory Visit | Attending: Neurosurgery | Admitting: Neurosurgery

## 2014-10-27 DIAGNOSIS — M5137 Other intervertebral disc degeneration, lumbosacral region: Secondary | ICD-10-CM

## 2014-10-31 ENCOUNTER — Encounter: Payer: Self-pay | Admitting: Cardiovascular Disease

## 2014-10-31 ENCOUNTER — Ambulatory Visit (INDEPENDENT_AMBULATORY_CARE_PROVIDER_SITE_OTHER): Payer: Medicare Other | Admitting: Cardiovascular Disease

## 2014-10-31 VITALS — BP 104/58 | HR 55 | Ht 59.0 in | Wt 126.0 lb

## 2014-10-31 DIAGNOSIS — R5383 Other fatigue: Secondary | ICD-10-CM | POA: Diagnosis not present

## 2014-10-31 DIAGNOSIS — R001 Bradycardia, unspecified: Secondary | ICD-10-CM | POA: Diagnosis not present

## 2014-10-31 DIAGNOSIS — R079 Chest pain, unspecified: Secondary | ICD-10-CM | POA: Diagnosis not present

## 2014-10-31 LAB — CBC
HCT: 34.5 % — ABNORMAL LOW (ref 36.0–46.0)
Hemoglobin: 11.6 g/dL — ABNORMAL LOW (ref 12.0–15.0)
MCH: 30.6 pg (ref 26.0–34.0)
MCHC: 33.6 g/dL (ref 30.0–36.0)
MCV: 91 fL (ref 78.0–100.0)
MPV: 11.9 fL (ref 8.6–12.4)
Platelets: 195 10*3/uL (ref 150–400)
RBC: 3.79 MIL/uL — ABNORMAL LOW (ref 3.87–5.11)
RDW: 14.1 % (ref 11.5–15.5)
WBC: 5.5 10*3/uL (ref 4.0–10.5)

## 2014-10-31 NOTE — Patient Instructions (Signed)
Your physician wants you to follow-up in: 1 year with Arnold Long NP You will receive a reminder letter in the mail two months in advance. If you don't receive a letter, please call our office to schedule the follow-up appointment.    STOP Enalapril    CBC,BMET Lab work due

## 2014-10-31 NOTE — Progress Notes (Signed)
Patient ID: [name], female   DOB: 1941/02/04, 74 y.o.   MRN: [id number]      SUBJECTIVE: The patient is here to followup for a chest pain syndrome and a history of asymptomatic sinus bradycardia. Her chest pain has been well-controlled since the addition of Ranexa. For the past one to two weeks, she has felt fatigued and weak. She denies syncope. Blood pressure today is 104/58, heart rate 55. She denies fevers, chills, shortness of breath, and dysuria.   Review of Systems: As per "subjective", otherwise negative.  Allergies  Allergen Reactions  . Dronabinol Nausea And Vomiting and Palpitations    Patient also states that she had palpatations  . Cefuroxime Axetil Nausea And Vomiting  . Codeine Nausea And Vomiting  . Diltiazem Nausea And Vomiting  . Morphine Other (See Comments)    "it will kill me" "made me stop breathing"  . Ondansetron Hives  . Shellfish Allergy Other (See Comments)    Blood sugar drops  . Aspirin Other (See Comments)    Gi bleed   . Penicillins Rash    Current Outpatient Prescriptions  Medication Sig Dispense Refill  . ALPRAZolam (XANAX XR) 1 MG 24 hr tablet Take 0.5-1 mg by mouth as needed for sleep (Anxiety).     . Calcium Carbonate-Vitamin D (CALCIUM + D PO) Take 1 tablet by mouth daily. TAKING 1,000 CALCIUM AND 2,000 VIT D    . calcium-vitamin D (OSCAL WITH D) 500-200 MG-UNIT per tablet Take 4 tablets by mouth.    . cetirizine (ZYRTEC) 10 MG tablet Take 10 mg by mouth daily.    . clopidogrel (PLAVIX) 75 MG tablet Take 75 mg by mouth daily.      . Cyanocobalamin (VITAMIN B 12 PO) Take 2,000 mcg by mouth daily.    Marland Kitchen docusate sodium (COLACE) 100 MG capsule Take 100 mg by mouth 2 (two) times daily.    . enalapril (VASOTEC) 5 MG tablet Take 5 mg by mouth daily.    . famotidine (PEPCID) 20 MG tablet Take 2 tablets (40 mg total) by mouth at bedtime. 90 tablet 3  . fentaNYL (DURAGESIC - DOSED MCG/HR) 50 MCG/HR Place 50 mcg onto the skin every 3 (three)  days.    Marland Kitchen gabapentin (NEURONTIN) 300 MG capsule take 1 capsule by oral route at night    . HYDROcodone-acetaminophen (NORCO/VICODIN) 5-325 MG per tablet Take 1 tablet by mouth 2 (two) times daily as needed for moderate pain.    . metFORMIN (GLUCOPHAGE) 1000 MG tablet Take 1 tablet (1,000 mg total) by mouth daily. HOLD for 2 days, resume on 04/22/2013. 30 tablet 1  . MISC NATURAL PRODUCTS ER PO Take 1-3 capsules by mouth daily. Digestive Blend Softgels    . mometasone (NASONEX) 50 MCG/ACT nasal spray Place 2 sprays into the nose daily as needed (Allergies).     . Multiple Vitamins-Minerals (MULTIVITAMINS THER. W/MINERALS) TABS tablet Take 1 tablet by mouth daily.    . polyethylene glycol (MIRALAX / GLYCOLAX) packet Take 17 g by mouth daily.    . promethazine (PHENERGAN) 12.5 MG tablet Take 1 tablet (12.5 mg total) by mouth every 8 (eight) hours as needed for nausea. 20 tablet 0  . ranolazine (RANEXA) 500 MG 12 hr tablet Take 1 tablet (500 mg total) by mouth 2 (two) times daily. 180 tablet 3  . tamsulosin (FLOMAX) 0.4 MG CAPS capsule Take 0.4 mg by mouth every 12 (twelve) hours.    . temazepam (RESTORIL) 30 MG capsule  Take 30 mg by mouth at bedtime as needed for sleep.    Marland Kitchen topiramate (TOPAMAX) 100 MG tablet Take 100 mg by mouth at bedtime.    . Travoprost, BAK Free, (TRAVATAN) 0.004 % SOLN ophthalmic solution Place 1 drop into both eyes at bedtime.     No current facility-administered medications for this visit.    Past Medical History  Diagnosis Date  . Nausea   . Bloating   . Dysphagia   . Gastroesophageal reflux   . Gastroparesis   . Anemia   . TIA (transient ischemic attack)     "3-4 before starting RX; none since" (04/19/2013)  . PONV (postoperative nausea and vomiting)   . Fibromyalgia   . Renal insufficiency   . Chronic neck pain   . Dizziness   . Chronic heartburn   . Chronic pain   . Anxiety   . Family history of anesthesia complication     "daughter has PONV too"  (04/19/2013)  . Hypertension     "for diabetes; don't have high blood pressure" (04/19/2013)  . Type II diabetes mellitus   . Bleeding stomach ulcer 1980's  . History of blood transfusion 1980's    "w/bleeding stomach ulcer" (04/19/2013)  . Hiatal hernia     "had it before; had OR; got it again" (04/19/2013)  . Chronic headaches   . Migraine     "take RX for it qd" (04/19/2013)  . DDD (degenerative disc disease), lumbar   . Arthritis     "all over" (04/19/2013)  . Chronic back pain   . Colon cancer   . Diabetic peripheral neuropathy     "in my feet" (04/19/2013)  . Walking pneumonia ~ 1966  . Pneumonia 04/2012  . Glaucoma, bilateral     Past Surgical History  Procedure Laterality Date  . Hemicolectomy  2010    ZIEGLER  . Colonoscopy  10/11/2010  . Colonoscopy  11/23/2009  . Colonoscopy  07/28/2008    W/SNARE  . Colonoscopy  06/28/07  . Colonoscopy  05/10/07    W/POLYP  . Colonoscopy  12/28/00  . Upper gastrointestinal endoscopy  10/11/2010    EGD ED  . Upper gastrointestinal endoscopy  11/23/2009  . Upper gastrointestinal endoscopy  05/10/07    EGD ED  . Upper gastrointestinal endoscopy  08/11/01    EGD ED  . Posterior fusion cervical spine  1985    "had a broken neck" (04/19/2013)  . Hiatal hernia repair  1970's  . Tonsillectomy  ~ 1953  . Colon surgery    . Cholecystectomy  1980's  . Anterior cervical discectomy  ~ 2009    "only cleaned out arthritis and spurs" (04/19/2013)  . Vaginal hysterectomy    . Cataract extraction w/ intraocular lens implant Left 04/13/2013  . Abdominal adhesion surgery  ~ 2012    "repaired wrap where they did hiatal hernia OR too" 911/04/2013)  . Colonoscopy with esophagogastroduodenoscopy (egd) N/A 05/13/2013    Procedure: COLONOSCOPY WITH ESOPHAGOGASTRODUODENOSCOPY (EGD);  Surgeon: Rogene Houston, MD;  Location: [facility];  Service: Endoscopy;  Laterality: N/A;  855  . Left heart catheterization with coronary angiogram N/A  04/19/2013    Procedure: LEFT HEART CATHETERIZATION WITH CORONARY ANGIOGRAM;  Surgeon: Peter M Martinique, MD;  Location: [facility] CATH LAB;  Service: Cardiovascular;  Laterality: N/A;    History   Social History  . Marital Status: Widowed    Spouse Name: N/A  . Number of Children: N/A  . Years of Education:  N/A   Occupational History  .     Social History Main Topics  . Smoking status: Former Smoker -- 1.50 packs/day for 15 years    Types: Cigarettes    Quit date: 03/17/1991  . Smokeless tobacco: Never Used     Comment: Patient states that it has ben greater than 20 years since she quit smoking  . Alcohol Use: No  . Drug Use: No  . Sexual Activity: No   Other Topics Concern  . Not on file   Social History Narrative   Patient lives at home alone.    Patient is retired.    Patient is widowed.    Patient has 4 children.   Patient has a 11th grade education.            Filed Vitals:   10/31/14 1520  BP: 104/58  Pulse: 55  Height: 4\' 11"  (1.499 m)  Weight: 126 lb (57.153 kg)  SpO2: 98%    PHYSICAL EXAM General: NAD HEENT: Normal. Neck: No JVD, no thyromegaly. Lungs: Clear to auscultation bilaterally with normal respiratory effort. CV: Nondisplaced PMI.  Bradycardic, regular rhythm, normal S1/S2, no S3/S4, no murmur. No pretibial or periankle edema.  No carotid bruit.  Normal pedal pulses.  Abdomen: Soft, nontender, no hepatosplenomegaly, no distention.  Neurologic: Alert and oriented x 3.  Psych: Normal affect. Skin: Normal. Musculoskeletal: Normal range of motion, no gross deformities. Extremities: No clubbing or cyanosis.   ECG: Most recent ECG reviewed.      ASSESSMENT AND PLAN: 1. Weakness and fatigue: May be due to low normal BP. No obvious symptoms/signs of infection. Will d/c enalapril 5 mg. Will check BMET and CBC to rule out other abnormalities. Encouraged to consume more water.  2. Chest pain: Symptomatically stable, and not due to hemodynamically  significant obstructive CAD. It is unclear what the etiology is, as she has had numerous tests which have been unrevealing. She appears to have a chest pain syndrome of which the etiologies include small vessel ischemic disease vs esophageal disease/spasm. At a prior visit, I prescribed Ranexa 500 mg bid, as the nitrates she took during a hospitalization did not alleviate her symptoms.  She has not had any recurrence of significant chest pain.   3. Bradycardia/chronotropic incompetence: HR previously 68 bpm at office visit with Dr. Laural Golden in 10/2013, currently 55 bpm. Prior Holter monitoring showed normal sinus rhythm with very few PACs and sinus bradycardia with an average heart rate of 50 beats per minute. There were no long pauses detected. She has had no episodes of dizziness or syncope, and does have a longstanding history of vertigo. Given her known h/o severe gastroparesis, she may very well have an autonomic neuropathy.   Dispo: f/u 1 year.  Kate Sable, M.D., F.A.C.C.

## 2014-11-01 ENCOUNTER — Ambulatory Visit (INDEPENDENT_AMBULATORY_CARE_PROVIDER_SITE_OTHER): Payer: Medicare Other | Admitting: Diagnostic Neuroimaging

## 2014-11-01 ENCOUNTER — Encounter: Payer: Self-pay | Admitting: Diagnostic Neuroimaging

## 2014-11-01 VITALS — BP 138/79 | HR 60 | Ht 59.0 in | Wt 125.8 lb

## 2014-11-01 DIAGNOSIS — R413 Other amnesia: Secondary | ICD-10-CM | POA: Diagnosis not present

## 2014-11-01 DIAGNOSIS — R482 Apraxia: Secondary | ICD-10-CM | POA: Diagnosis not present

## 2014-11-01 LAB — BASIC METABOLIC PANEL
BUN: 9 mg/dL (ref 6–23)
CO2: 25 mEq/L (ref 19–32)
Calcium: 10.1 mg/dL (ref 8.4–10.5)
Chloride: 105 mEq/L (ref 96–112)
Creat: 0.76 mg/dL (ref 0.50–1.10)
Glucose, Bld: 115 mg/dL — ABNORMAL HIGH (ref 70–99)
Potassium: 3.8 mEq/L (ref 3.5–5.3)
Sodium: 139 mEq/L (ref 135–145)

## 2014-11-01 NOTE — Progress Notes (Signed)
[facility]  PATIENT: [name] DOB: Nov 24, 1940  REFERRING CLINICIAN: Saintclair Halsted  HISTORY FROM: patient and sister REASON FOR VISIT: follow up   HISTORICAL  CHIEF COMPLAINT:  Chief Complaint  Patient presents with  . Follow-up    bilateral ptosis    HISTORY OF PRESENT ILLNESS:   UPDATE 11/01/14: Since last visit, sxs are stable. Testing reviewed. Sister notes some memory issues for pt as well. Pt living alone.   PRIOR HPI (08/03/14): 74 year old right-handed female here for evaluation of eyelid weakness. For past 3 months patient has had episodes where her "eyes feel tired" and her eyelids started to droop down and fully close. She's had several episodes where she has had to pull her car over because she cannot see due to inability to open her eyes. Patient had been evaluated by neurosurgery for neck pain, and was doing fairly well with occipital nerve block injections for headaches. Patient mentioned her eyelid weakness problem and therefore patient was referred to me for consideration of blepharospasm as a possible diagnosis. Patient's family note that she's been having some memory loss, repeating stories, as well as more headaches. Patient has photophobia and phonophobia with headaches. Headaches affect frontal and occipital regions.   REVIEW OF SYSTEMS: Full 14 system review of systems performed and notable only for fatigue hearing loss constipation urination problems joint pain cramps aching muscles allergies runny nose anemia easy bruising headache difficulty swallowing insomnia sleepiness anxiety not enough asleep decreased energy change in appetite disinterest in activities.   ALLERGIES: Allergies  Allergen Reactions  . Dronabinol Nausea And Vomiting and Palpitations    Patient also states that she had palpatations  . Cefuroxime Axetil Nausea And Vomiting  . Codeine Nausea And Vomiting  . Diltiazem Nausea And Vomiting  . Morphine Other (See Comments)      "it will kill me" "made me stop breathing"  . Ondansetron Hives  . Shellfish Allergy Other (See Comments)    Blood sugar drops  . Aspirin Other (See Comments)    Gi bleed   . Penicillins Rash    HOME MEDICATIONS: Outpatient Prescriptions Prior to Visit  Medication Sig Dispense Refill  . ALPRAZolam (XANAX XR) 1 MG 24 hr tablet Take 0.5-1 mg by mouth as needed for sleep (Anxiety).     . Calcium Carbonate-Vitamin D (CALCIUM + D PO) Take 1 tablet by mouth daily. TAKING 1,000 CALCIUM AND 2,000 VIT D    . calcium-vitamin D (OSCAL WITH D) 500-200 MG-UNIT per tablet Take 4 tablets by mouth.    . cetirizine (ZYRTEC) 10 MG tablet Take 10 mg by mouth daily.    . clopidogrel (PLAVIX) 75 MG tablet Take 75 mg by mouth daily.      . Cyanocobalamin (VITAMIN B 12 PO) Take 2,000 mcg by mouth daily.    Marland Kitchen docusate sodium (COLACE) 100 MG capsule Take 100 mg by mouth 2 (two) times daily.    . famotidine (PEPCID) 20 MG tablet Take 2 tablets (40 mg total) by mouth at bedtime. 90 tablet 3  . fentaNYL (DURAGESIC - DOSED MCG/HR) 50 MCG/HR Place 50 mcg onto the skin every 3 (three) days.    Marland Kitchen gabapentin (NEURONTIN) 300 MG capsule take 1 capsule by oral route at night    . HYDROcodone-acetaminophen (NORCO/VICODIN) 5-325 MG per tablet Take 1 tablet by mouth 2 (two) times daily as needed for moderate pain.    . metFORMIN (GLUCOPHAGE) 1000 MG tablet Take 1 tablet (1,000 mg total)  by mouth daily. HOLD for 2 days, resume on 04/22/2013. 30 tablet 1  . MISC NATURAL PRODUCTS ER PO Take 1-3 capsules by mouth daily. Digestive Blend Softgels    . mometasone (NASONEX) 50 MCG/ACT nasal spray Place 2 sprays into the nose daily as needed (Allergies).     . Multiple Vitamins-Minerals (MULTIVITAMINS THER. W/MINERALS) TABS tablet Take 1 tablet by mouth daily.    . polyethylene glycol (MIRALAX / GLYCOLAX) packet Take 17 g by mouth daily.    . promethazine (PHENERGAN) 12.5 MG tablet Take 1 tablet (12.5 mg total) by mouth every 8  (eight) hours as needed for nausea. 20 tablet 0  . ranolazine (RANEXA) 500 MG 12 hr tablet Take 1 tablet (500 mg total) by mouth 2 (two) times daily. 180 tablet 3  . tamsulosin (FLOMAX) 0.4 MG CAPS capsule Take 0.4 mg by mouth every 12 (twelve) hours.    . temazepam (RESTORIL) 30 MG capsule Take 30 mg by mouth at bedtime as needed for sleep.    Marland Kitchen topiramate (TOPAMAX) 100 MG tablet Take 100 mg by mouth at bedtime.    . Travoprost, BAK Free, (TRAVATAN) 0.004 % SOLN ophthalmic solution Place 1 drop into both eyes at bedtime.     No facility-administered medications prior to visit.    PAST MEDICAL HISTORY: Past Medical History  Diagnosis Date  . Nausea   . Bloating   . Dysphagia   . Gastroesophageal reflux   . Gastroparesis   . Anemia   . TIA (transient ischemic attack)     "3-4 before starting RX; none since" (04/19/2013)  . PONV (postoperative nausea and vomiting)   . Fibromyalgia   . Renal insufficiency   . Chronic neck pain   . Dizziness   . Chronic heartburn   . Chronic pain   . Anxiety   . Family history of anesthesia complication     "daughter has PONV too" (04/19/2013)  . Hypertension     "for diabetes; don't have high blood pressure" (04/19/2013)  . Type II diabetes mellitus   . Bleeding stomach ulcer 1980's  . History of blood transfusion 1980's    "w/bleeding stomach ulcer" (04/19/2013)  . Hiatal hernia     "had it before; had OR; got it again" (04/19/2013)  . Chronic headaches   . Migraine     "take RX for it qd" (04/19/2013)  . DDD (degenerative disc disease), lumbar   . Arthritis     "all over" (04/19/2013)  . Chronic back pain   . Colon cancer   . Diabetic peripheral neuropathy     "in my feet" (04/19/2013)  . Walking pneumonia ~ 1966  . Pneumonia 04/2012  . Glaucoma, bilateral     PAST SURGICAL HISTORY: Past Surgical History  Procedure Laterality Date  . Hemicolectomy  2010    ZIEGLER  . Colonoscopy  10/11/2010  . Colonoscopy  11/23/2009  .  Colonoscopy  07/28/2008    W/SNARE  . Colonoscopy  06/28/07  . Colonoscopy  05/10/07    W/POLYP  . Colonoscopy  12/28/00  . Upper gastrointestinal endoscopy  10/11/2010    EGD ED  . Upper gastrointestinal endoscopy  11/23/2009  . Upper gastrointestinal endoscopy  05/10/07    EGD ED  . Upper gastrointestinal endoscopy  08/11/01    EGD ED  . Posterior fusion cervical spine  1985    "had a broken neck" (04/19/2013)  . Hiatal hernia repair  1970's  . Tonsillectomy  ~ 1953  .  Colon surgery    . Cholecystectomy  1980's  . Anterior cervical discectomy  ~ 2009    "only cleaned out arthritis and spurs" (04/19/2013)  . Vaginal hysterectomy    . Cataract extraction w/ intraocular lens implant Left 04/13/2013  . Abdominal adhesion surgery  ~ 2012    "repaired wrap where they did hiatal hernia OR too" 911/04/2013)  . Colonoscopy with esophagogastroduodenoscopy (egd) N/A 05/13/2013    Procedure: COLONOSCOPY WITH ESOPHAGOGASTRODUODENOSCOPY (EGD);  Surgeon: Rogene Houston, MD;  Location: [facility];  Service: Endoscopy;  Laterality: N/A;  855  . Left heart catheterization with coronary angiogram N/A 04/19/2013    Procedure: LEFT HEART CATHETERIZATION WITH CORONARY ANGIOGRAM;  Surgeon: Peter M Martinique, MD;  Location: [facility] CATH LAB;  Service: Cardiovascular;  Laterality: N/A;    FAMILY HISTORY: Family History  Problem Relation Age of Onset  . Heart disease Mother   . Diabetes Mother   . Dementia Father   . Healthy Sister   . Diabetes Brother   . Kidney cancer Brother   . Diabetes Brother   . Neuropathy Brother   . Diabetes Daughter   . Diabetes Daughter   . Diabetes Son     SOCIAL HISTORY:  History   Social History  . Marital Status: Widowed    Spouse Name: N/A  . Number of Children: N/A  . Years of Education: N/A   Occupational History  .     Social History Main Topics  . Smoking status: Former Smoker -- 1.50 packs/day for 15 years    Types: Cigarettes    Quit date:  03/17/1991  . Smokeless tobacco: Never Used     Comment: Patient states that it has ben greater than 20 years since she quit smoking  . Alcohol Use: No  . Drug Use: No  . Sexual Activity: No   Other Topics Concern  . Not on file   Social History Narrative   Patient lives at home alone.    Patient is retired.    Patient is widowed.    Patient has 4 children.   Patient has a 11th grade education.            PHYSICAL EXAM  Filed Vitals:   11/01/14 1431  BP: 138/79  Pulse: 60  Height: 4\' 11"  (1.499 m)  Weight: 125 lb 12.8 oz (57.063 kg)    Body mass index is 25.4 kg/(m^2).  No exam data present  MMSE - Mini Mental State Exam 08/02/2014  Orientation to time 5  Orientation to Place 4  Registration 3  Attention/ Calculation 0  Recall 2  Language- name 2 objects 2  Language- repeat 1  Language- follow 3 step command 2  Language- read & follow direction 1  Write a sentence 1  Copy design 0  Total score 21    GENERAL EXAM: Patient is in no distress; well developed, nourished and groomed; neck is supple  CARDIOVASCULAR: Regular rate and rhythm, no murmurs, no carotid bruits  NEUROLOGIC: MENTAL STATUS: awake, alert, oriented to person, place and time, recent and remote memory intact, normal attention and concentration, language fluent, comprehension intact, naming intact, fund of knowledge appropriate; POSITIVE SNOUT AND MYERSON'S REFLEXES CRANIAL NERVE: no papilledema on fundoscopic exam, pupils equal and reactive to light, visual fields full to confrontation, extraocular muscles --> LIMITED UPGAZE, no nystagmus, facial sensation and strength symmetric, hearing intact, palate elevates symmetrically, uvula midline, shoulder shrug symmetric, tongue midline. NO PTOSIS OR DOUBLE VISION AFTER 1  MINUTE UPGAZE MOTOR: normal bulk and tone, full strength in the BUE, BLE SENSORY: normal and symmetric to light touch, temperature, vibration COORDINATION: finger-nose-finger, fine  finger movements normal REFLEXES: deep tendon reflexes present and symmetric; TRACE AT ANKLES GAIT/STATION: narrow based gait; USES SINGLE POINT CANE; SLIGHTLY UNSTEADY; romberg is negative    DIAGNOSTIC DATA (LABS, IMAGING, TESTING) - I reviewed patient records, labs, notes, testing and imaging myself where available.  Lab Results  Component Value Date   WBC 5.5 10/31/2014   HGB 11.6* 10/31/2014   HCT 34.5* 10/31/2014   MCV 91.0 10/31/2014   PLT 195 10/31/2014      Component Value Date/Time   NA 139 10/31/2014 1639   NA 139 07/01/2010   K 3.8 10/31/2014 1639   CL 105 10/31/2014 1639   CO2 25 10/31/2014 1639   GLUCOSE 115* 10/31/2014 1639   BUN 9 10/31/2014 1639   CREATININE 0.76 10/31/2014 1639   CREATININE 0.79 04/19/2013 1101   CALCIUM 10.1 10/31/2014 1639   PROT 6.7 02/07/2014 1554   ALBUMIN 4.2 02/07/2014 1554   AST 10 02/07/2014 1554   ALT 8 02/07/2014 1554   ALKPHOS 52 02/07/2014 1554   BILITOT 0.2 02/07/2014 1554   GFRNONAA 82* 04/19/2013 1101   GFRAA >90 04/19/2013 1101   No results found for: CHOL, HDL, LDLCALC, LDLDIRECT, TRIG, CHOLHDL No results found for: HGBA1C Lab Results  Component Value Date   VITAMINB12 1979* 05/02/2012   Lab Results  Component Value Date   TSH 1.801 01/18/2013    I reviewed images myself and agree with interpretation. -VRP  03/02/13 MRI brain  1. Progression of periventricular and subcortical white matter disease without evidence for acute infarction. This likely reflects the sequela of chronic microvascular ischemia. 2. Postoperative changes of suboccipital craniotomy.  08/08/14 MRI brain 1. No acute intracranial abnormality or mass. 2. Moderate chronic small vessel ischemic disease, mildly progressed from prior.    ASSESSMENT AND PLAN  74 y.o. year old female here with progressive intermittent eye opening difficulty.   I do not see evidence of fatigable weakness to suggest neuromuscular junction disorder such as  myasthenia gravis. Patient does not give symptoms of forced eyelid closure or eyelid spasms to suggest blepharospasm or hemifacial spasm.  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 Her Mini-Mental Status exam score was 21 out of 30 and she has frontal release signs. She did not have her eye opening problem during our office visit today.  Ddx: eyelid opening apraxia due to neurodegenerative disorder (dementia vs chronic progressive external ophthalmoplegia) vs insomnia associated fatigue   PLAN: - monitor symptoms - caution with driving and living alone - check MMSE at next visit  Return in about 6 months (around 05/04/2015) for for MMSE at next visit.  I spent 15 minutes of face to face time with patient. Greater than 50% of time was spent in counseling and coordination of care with patient.    Penni Bombard, MD 4/96/7591, 6:38 PM Certified in Neurology, Neurophysiology and Neuroimaging  [facility] [address] [place], [place] [address] [phone]

## 2014-11-07 ENCOUNTER — Other Ambulatory Visit: Payer: Self-pay | Admitting: Neurosurgery

## 2014-11-07 DIAGNOSIS — M4317 Spondylolisthesis, lumbosacral region: Secondary | ICD-10-CM

## 2014-11-08 ENCOUNTER — Ambulatory Visit
Admission: RE | Admit: 2014-11-08 | Discharge: 2014-11-08 | Disposition: A | Discharge status: Home / Self Care | Payer: Medicare Other | Source: Ambulatory Visit | Attending: Neurosurgery | Admitting: Neurosurgery

## 2014-11-08 ENCOUNTER — Other Ambulatory Visit: Payer: Self-pay | Admitting: Neurosurgery

## 2014-11-08 DIAGNOSIS — M4317 Spondylolisthesis, lumbosacral region: Secondary | ICD-10-CM

## 2014-11-08 MED ORDER — IOHEXOL 180 MG/ML  SOLN
1.0000 mL | Freq: Once | INTRAMUSCULAR | Status: AC | PRN
  Administered 2014-11-08: 1 mL via EPIDURAL

## 2014-11-08 MED ORDER — METHYLPREDNISOLONE ACETATE 40 MG/ML INJ SUSP (RADIOLOG
120.0000 mg | Freq: Once | INTRAMUSCULAR | Status: AC
  Administered 2014-11-08: 120 mg via EPIDURAL

## 2014-11-08 NOTE — Discharge Instructions (Signed)

## 2014-12-05 ENCOUNTER — Emergency Department (HOSPITAL_COMMUNITY): Admission: EM | Admit: 2014-12-05 | Discharge: 2014-12-05 | Discharge status: Absent without leave | Payer: Self-pay

## 2014-12-05 ENCOUNTER — Encounter (HOSPITAL_COMMUNITY): Payer: Self-pay | Admitting: Emergency Medicine

## 2014-12-05 ENCOUNTER — Emergency Department (HOSPITAL_COMMUNITY): Payer: Medicare Other

## 2014-12-05 ENCOUNTER — Observation Stay (HOSPITAL_COMMUNITY)
Admission: EM | Admit: 2014-12-05 | Discharge: 2014-12-06 | Disposition: A | Discharge status: Home / Self Care | Payer: Medicare Other | Attending: Internal Medicine | Admitting: Internal Medicine

## 2014-12-05 DIAGNOSIS — K219 Gastro-esophageal reflux disease without esophagitis: Secondary | ICD-10-CM | POA: Diagnosis not present

## 2014-12-05 DIAGNOSIS — M159 Polyosteoarthritis, unspecified: Secondary | ICD-10-CM | POA: Diagnosis not present

## 2014-12-05 DIAGNOSIS — E1143 Type 2 diabetes mellitus with diabetic autonomic (poly)neuropathy: Secondary | ICD-10-CM | POA: Diagnosis present

## 2014-12-05 DIAGNOSIS — Z85038 Personal history of other malignant neoplasm of large intestine: Secondary | ICD-10-CM | POA: Insufficient documentation

## 2014-12-05 DIAGNOSIS — M797 Fibromyalgia: Secondary | ICD-10-CM | POA: Diagnosis not present

## 2014-12-05 DIAGNOSIS — N23 Unspecified renal colic: Secondary | ICD-10-CM | POA: Diagnosis not present

## 2014-12-05 DIAGNOSIS — Z87891 Personal history of nicotine dependence: Secondary | ICD-10-CM | POA: Diagnosis not present

## 2014-12-05 DIAGNOSIS — Z8701 Personal history of pneumonia (recurrent): Secondary | ICD-10-CM | POA: Diagnosis not present

## 2014-12-05 DIAGNOSIS — N200 Calculus of kidney: Secondary | ICD-10-CM

## 2014-12-05 DIAGNOSIS — D649 Anemia, unspecified: Secondary | ICD-10-CM | POA: Insufficient documentation

## 2014-12-05 DIAGNOSIS — E872 Acidosis, unspecified: Secondary | ICD-10-CM | POA: Insufficient documentation

## 2014-12-05 DIAGNOSIS — N201 Calculus of ureter: Principal | ICD-10-CM | POA: Insufficient documentation

## 2014-12-05 DIAGNOSIS — H409 Unspecified glaucoma: Secondary | ICD-10-CM | POA: Diagnosis not present

## 2014-12-05 DIAGNOSIS — F419 Anxiety disorder, unspecified: Secondary | ICD-10-CM | POA: Diagnosis not present

## 2014-12-05 DIAGNOSIS — R630 Anorexia: Secondary | ICD-10-CM | POA: Insufficient documentation

## 2014-12-05 DIAGNOSIS — G43909 Migraine, unspecified, not intractable, without status migrainosus: Secondary | ICD-10-CM | POA: Insufficient documentation

## 2014-12-05 DIAGNOSIS — E1140 Type 2 diabetes mellitus with diabetic neuropathy, unspecified: Secondary | ICD-10-CM | POA: Insufficient documentation

## 2014-12-05 DIAGNOSIS — R7989 Other specified abnormal findings of blood chemistry: Secondary | ICD-10-CM

## 2014-12-05 DIAGNOSIS — Z8673 Personal history of transient ischemic attack (TIA), and cerebral infarction without residual deficits: Secondary | ICD-10-CM | POA: Insufficient documentation

## 2014-12-05 DIAGNOSIS — Z79899 Other long term (current) drug therapy: Secondary | ICD-10-CM | POA: Diagnosis not present

## 2014-12-05 DIAGNOSIS — R110 Nausea: Secondary | ICD-10-CM | POA: Diagnosis not present

## 2014-12-05 DIAGNOSIS — Z880 Allergy status to penicillin: Secondary | ICD-10-CM | POA: Diagnosis not present

## 2014-12-05 DIAGNOSIS — G8929 Other chronic pain: Secondary | ICD-10-CM | POA: Insufficient documentation

## 2014-12-05 DIAGNOSIS — K3184 Gastroparesis: Secondary | ICD-10-CM

## 2014-12-05 DIAGNOSIS — R109 Unspecified abdominal pain: Secondary | ICD-10-CM | POA: Diagnosis present

## 2014-12-05 LAB — CBC WITH DIFFERENTIAL/PLATELET
Basophils Absolute: 0 10*3/uL (ref 0.0–0.1)
Basophils Relative: 0 % (ref 0–1)
Eosinophils Absolute: 0 10*3/uL (ref 0.0–0.7)
Eosinophils Relative: 0 % (ref 0–5)
HCT: 36.2 % (ref 36.0–46.0)
Hemoglobin: 11.9 g/dL — ABNORMAL LOW (ref 12.0–15.0)
Lymphocytes Relative: 14 % (ref 12–46)
Lymphs Abs: 1.3 10*3/uL (ref 0.7–4.0)
MCH: 31 pg (ref 26.0–34.0)
MCHC: 32.9 g/dL (ref 30.0–36.0)
MCV: 94.3 fL (ref 78.0–100.0)
Monocytes Absolute: 0.2 10*3/uL (ref 0.1–1.0)
Monocytes Relative: 2 % — ABNORMAL LOW (ref 3–12)
Neutro Abs: 8.1 10*3/uL — ABNORMAL HIGH (ref 1.7–7.7)
Neutrophils Relative %: 84 % — ABNORMAL HIGH (ref 43–77)
Platelets: 170 10*3/uL (ref 150–400)
RBC: 3.84 MIL/uL — ABNORMAL LOW (ref 3.87–5.11)
RDW: 13.4 % (ref 11.5–15.5)
WBC: 9.6 10*3/uL (ref 4.0–10.5)

## 2014-12-05 LAB — COMPREHENSIVE METABOLIC PANEL
ALT: 13 U/L — ABNORMAL LOW (ref 14–54)
AST: 14 U/L — ABNORMAL LOW (ref 15–41)
Albumin: 3.8 g/dL (ref 3.5–5.0)
Alkaline Phosphatase: 39 U/L (ref 38–126)
Anion gap: 10 (ref 5–15)
BUN: 13 mg/dL (ref 6–20)
CO2: 24 mmol/L (ref 22–32)
Calcium: 9.7 mg/dL (ref 8.9–10.3)
Chloride: 104 mmol/L (ref 101–111)
Creatinine, Ser: 0.82 mg/dL (ref 0.44–1.00)
GFR calc Af Amer: >60 mL/min (ref >60)
GFR calc non Af Amer: >60 mL/min (ref >60)
Glucose, Bld: 205 mg/dL — ABNORMAL HIGH (ref 65–99)
Potassium: 3.5 mmol/L (ref 3.5–5.1)
Sodium: 138 mmol/L (ref 135–145)
Total Bilirubin: 0.4 mg/dL (ref 0.3–1.2)
Total Protein: 6.9 g/dL (ref 6.5–8.1)

## 2014-12-05 LAB — URINE MICROSCOPIC-ADD ON

## 2014-12-05 LAB — URINALYSIS, ROUTINE W REFLEX MICROSCOPIC
Bilirubin Urine: NEGATIVE
Glucose, UA: NEGATIVE mg/dL
Ketones, ur: NEGATIVE mg/dL
Nitrite: NEGATIVE
Protein, ur: NEGATIVE mg/dL
Specific Gravity, Urine: 1.025 (ref 1.005–1.030)
Urobilinogen, UA: 0.2 mg/dL (ref 0.0–1.0)
pH: 5.5 (ref 5.0–8.0)

## 2014-12-05 LAB — LACTIC ACID, PLASMA
Lactic Acid, Venous: 2.7 mmol/L (ref 0.5–2.0)
Lactic Acid, Venous: 4.8 mmol/L (ref 0.5–2.0)

## 2014-12-05 LAB — LIPASE, BLOOD: Lipase: 24 U/L (ref 22–51)

## 2014-12-05 MED ORDER — SODIUM CHLORIDE 0.9 % IV SOLN
INTRAVENOUS | Status: DC
  Administered 2014-12-05: 1000 mL via INTRAVENOUS

## 2014-12-05 MED ORDER — IOHEXOL 300 MG/ML  SOLN
50.0000 mL | Freq: Once | INTRAMUSCULAR | Status: AC | PRN
  Administered 2014-12-05: 50 mL via ORAL

## 2014-12-05 MED ORDER — SODIUM CHLORIDE 0.9 % IV BOLUS (SEPSIS)
500.0000 mL | Freq: Once | INTRAVENOUS | Status: AC
  Administered 2014-12-05: 500 mL via INTRAVENOUS

## 2014-12-05 MED ORDER — PROMETHAZINE HCL 25 MG/ML IJ SOLN
6.2500 mg | Freq: Once | INTRAMUSCULAR | Status: AC
  Administered 2014-12-05: 6.25 mg via INTRAVENOUS
  Filled 2014-12-05: qty 1

## 2014-12-05 MED ORDER — FENTANYL CITRATE (PF) 100 MCG/2ML IJ SOLN
6.2500 ug | INTRAMUSCULAR | Status: DC | PRN
  Administered 2014-12-05: 6.5 ug via INTRAVENOUS
  Filled 2014-12-05: qty 2

## 2014-12-05 MED ORDER — IOHEXOL 300 MG/ML  SOLN
100.0000 mL | Freq: Once | INTRAMUSCULAR | Status: AC | PRN
  Administered 2014-12-05: 100 mL via INTRAVENOUS

## 2014-12-05 NOTE — ED Notes (Signed)
CRITICAL VALUE ALERT  Critical value received:  Lactic acid 2.7  Date of notification:  12/05/14  Time of notification:  2006  Critical value read back:Yes.    Nurse who received alert:  Derek Mound, RN  MD notified (1st page):  Thurnell Garbe  Time of first page:  2007  MD notified (2nd page):  Time of second page:  Responding MD:  Thurnell Garbe  Time MD responded:  2007

## 2014-12-05 NOTE — ED Notes (Signed)
CRITICAL VALUE ALERT  Critical value received:  Lactic acid 4.8  Date of notification:  12/05/14  Time of notification:  2210  Critical value read back:Yes.    Nurse who received alert:  Joellyn Rued, RN  MD notified (1st page):  Dr Thurnell Garbe  Time of first page:  2210  MD notified (2nd page):  Time of second page:  Responding MD:  Dr Thurnell Garbe  Time MD responded:  2210

## 2014-12-05 NOTE — ED Notes (Signed)
Pt reports RLQ pain that started at 1500. Pt reports nausea but no vomiting.

## 2014-12-05 NOTE — ED Notes (Signed)
   12/05/14 1921  Abdominal  Gastrointestinal (WDL) X  Abdomen Inspection Soft  Tenderness Tender  Last Bowel Movement Date 12/05/14  pt states right lower quadrant that started this afternoon. Pt also nauseated.

## 2014-12-05 NOTE — H&P (Signed)
[facility] History and Physical  [name] KDT:[id number] DOB: [birth year]    PCP:   Robert Bellow, MD   Chief Complaint: right flank pain x 1 day.   HPI: [name] is an 74 y.o. 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 She has no dysuria, polyuria, fever, chills, but did have some nausea, no vomiting.  Evaluation in the ER showed no fever, no leukocytosis, no tachycardia, and no hypotension, but lactic acid was ordered, and it was elevated to 2.7.  IVF was given, and repeat lactic acid was elevated to 4.8.  Her abdominal pelvic CT was done and it showed a right kidney stone with hydroureternephrosis, with slight periureteric inflammation at the stone level.  Because of the elevated uric acid, hospitalist was consulted. She had no diarrhea, but she has not been able to eat anything " all day"  Rewiew of Systems:  Constitutional: Negative for malaise, fever and chills. No significant weight loss or weight gain Eyes: Negative for eye pain, redness and discharge, diplopia, visual changes, or flashes of light. ENMT: Negative for ear pain, hoarseness, nasal congestion, sinus pressure and sore throat. No headaches; tinnitus, drooling, or problem swallowing. Cardiovascular: Negative for chest pain, palpitations, diaphoresis, dyspnea and peripheral edema. ; No orthopnea, PND Respiratory: Negative for cough, hemoptysis, wheezing and stridor. No pleuritic chestpain. Gastrointestinal: Negative for nausea, vomiting, diarrhea, constipation, abdominal pain, melena, blood in stool, hematemesis, jaundice and rectal bleeding.    Genitourinary: Negative for frequency, dysuria, incontinence,flank pain and hematuria; Musculoskeletal: Negative for  back pain and neck pain. Negative for swelling and trauma.;  Skin: . Negative for pruritus, rash, abrasions, bruising and skin lesion.; ulcerations Neuro: Negative for headache, lightheadedness and neck stiffness. Negative for weakness, altered level of consciousness , altered mental status, extremity weakness, burning feet, involuntary movement, seizure and syncope.  Psych: negative for anxiety, depression, insomnia, tearfulness, panic attacks, hallucinations, paranoia, suicidal or homicidal ideation    Past Medical History  Diagnosis Date  . Nausea   . Bloating   . Dysphagia   . Gastroesophageal reflux   . Gastroparesis   . Anemia   . TIA (transient ischemic attack)     "3-4 before starting RX; none since" (04/19/2013)  . PONV (postoperative nausea and vomiting)   . Fibromyalgia   . Renal insufficiency   . Chronic neck pain   . Dizziness   . Chronic heartburn   . Chronic pain   . Anxiety   . Family history of anesthesia complication     "daughter has PONV too" (04/19/2013)  . Hypertension     "for diabetes; don't have high blood pressure" (04/19/2013)  . Type II diabetes mellitus   . Bleeding stomach ulcer 1980's  . History of blood transfusion 1980's    "w/bleeding stomach ulcer" (04/19/2013)  . Hiatal hernia     "had it before; had OR; got it again" (04/19/2013)  . Chronic headaches   . Migraine     "take RX for it qd" (04/19/2013)  . DDD (degenerative disc disease), lumbar   . Arthritis     "all over" (04/19/2013)  . Chronic back pain   . Colon cancer   . Diabetic peripheral neuropathy     "in my feet" (04/19/2013)  .  Walking pneumonia ~ 1966  . Pneumonia 04/2012  . Glaucoma, bilateral     Past Surgical History  Procedure Laterality Date  . Hemicolectomy  2010    ZIEGLER  . Colonoscopy  10/11/2010  . Colonoscopy  11/23/2009  . Colonoscopy  07/28/2008    W/SNARE  . Colonoscopy  06/28/07  . Colonoscopy  05/10/07    W/POLYP  . Colonoscopy  12/28/00  .  Upper gastrointestinal endoscopy  10/11/2010    EGD ED  . Upper gastrointestinal endoscopy  11/23/2009  . Upper gastrointestinal endoscopy  05/10/07    EGD ED  . Upper gastrointestinal endoscopy  08/11/01    EGD ED  . Posterior fusion cervical spine  1985    "had a broken neck" (04/19/2013)  . Hiatal hernia repair  1970's  . Tonsillectomy  ~ 1953  . Colon surgery    . Cholecystectomy  1980's  . Anterior cervical discectomy  ~ 2009    "only cleaned out arthritis and spurs" (04/19/2013)  . Vaginal hysterectomy    . Cataract extraction w/ intraocular lens implant Left 04/13/2013  . Abdominal adhesion surgery  ~ 2012    "repaired wrap where they did hiatal hernia OR too" 911/04/2013)  . Colonoscopy with esophagogastroduodenoscopy (egd) N/A 05/13/2013    Procedure: COLONOSCOPY WITH ESOPHAGOGASTRODUODENOSCOPY (EGD);  Surgeon: Rogene Houston, MD;  Location: [facility];  Service: Endoscopy;  Laterality: N/A;  855  . Left heart catheterization with coronary angiogram N/A 04/19/2013    Procedure: LEFT HEART CATHETERIZATION WITH CORONARY ANGIOGRAM;  Surgeon: Ambyr Qadri M Martinique, MD;  Location: [facility] CATH LAB;  Service: Cardiovascular;  Laterality: N/A;    Medications:  HOME MEDS: Prior to Admission medications   Medication Sig Start Date End Date Taking? Authorizing Provider  Calcium Carbonate-Vitamin D (CALCIUM + D PO) Take 1 tablet by mouth daily. TAKING 1,000 CALCIUM AND 2,000 VIT D   Yes Historical Provider, MD  cetirizine (ZYRTEC) 10 MG tablet Take 10 mg by mouth daily.   Yes Historical Provider, MD  Cyanocobalamin (VITAMIN B 12 PO) Take 2,000 mcg by mouth daily.   Yes Historical Provider, MD  docusate sodium (COLACE) 100 MG capsule Take 100 mg by mouth 2 (two) times daily.   Yes Historical Provider, MD  famotidine (PEPCID) 20 MG tablet Take 2 tablets (40 mg total) by mouth at bedtime. 07/20/12  Yes Rogene Houston, MD  fentaNYL (DURAGESIC - DOSED MCG/HR) 50 MCG/HR Place 50 mcg onto the skin  every 3 (three) days.   Yes Historical Provider, MD  metFORMIN (GLUCOPHAGE) 500 MG tablet Take 500 mg by mouth daily with breakfast.   Yes Historical Provider, MD  MISC NATURAL PRODUCTS ER PO Take 1-3 capsules by mouth daily. Digestive Blend Softgels   Yes Historical Provider, MD  Multiple Vitamins-Minerals (MULTIVITAMINS THER. W/MINERALS) TABS tablet Take 1 tablet by mouth daily.   Yes Historical Provider, MD  polyethylene glycol (MIRALAX / GLYCOLAX) packet Take 17 g by mouth daily.   Yes Historical Provider, MD  ranolazine (RANEXA) 500 MG 12 hr tablet Take 1 tablet (500 mg total) by mouth 2 (two) times daily. 10/04/14  Yes Herminio Commons, MD  tamsulosin (FLOMAX) 0.4 MG CAPS capsule Take 0.4 mg by mouth every 12 (twelve) hours.   Yes Historical Provider, MD  topiramate (TOPAMAX) 100 MG tablet Take 100 mg by mouth at bedtime.   Yes Historical Provider, MD  Travoprost, BAK Free, (TRAVATAN) 0.004 % SOLN ophthalmic solution Place 1 drop into both eyes  at bedtime.   Yes Historical Provider, MD  acetaminophen (TYLENOL) 500 MG tablet Take 500 mg by mouth every 6 (six) hours as needed for mild pain.     Historical Provider, MD  ALPRAZolam (XANAX XR) 1 MG 24 hr tablet Take 0.5-1 mg by mouth as needed for sleep (Anxiety).     Historical Provider, MD  metFORMIN (GLUCOPHAGE) 1000 MG tablet Take 1 tablet (1,000 mg total) by mouth daily. HOLD for 2 days, resume on 04/22/2013. Patient not taking: Reported on 12/05/2014 04/20/13   Evelene Croon Barrett, PA-C  mometasone (NASONEX) 50 MCG/ACT nasal spray Place 2 sprays into the nose daily as needed (Allergies).     Historical Provider, MD  oxyCODONE-acetaminophen (PERCOCET/ROXICET) 5-325 MG per tablet Take 1 tablet by mouth every 4 (four) hours as needed for moderate pain.    Historical Provider, MD  promethazine (PHENERGAN) 12.5 MG tablet Take 1 tablet (12.5 mg total) by mouth every 8 (eight) hours as needed for nausea. Patient not taking: Reported on 12/05/2014  07/19/13   Rogene Houston, MD     Allergies:  Allergies  Allergen Reactions  . Morphine Anaphylaxis and Other (See Comments)    "it will kill me" "made me stop breathing"  . Aspirin Other (See Comments)    Gi bleed   . Cefuroxime Axetil Nausea And Vomiting  . Codeine Nausea And Vomiting  . Diltiazem Nausea And Vomiting  . Dronabinol Nausea And Vomiting and Palpitations    Patient also states that she had palpatations  . Shellfish Allergy Other (See Comments)    Blood sugar drops  . Ondansetron Hives  . Penicillins Rash    Social History:   reports that she quit smoking about 23 years ago. Her smoking use included Cigarettes. She has a 22.5 pack-year smoking history. She has never used smokeless tobacco. She reports that she does not drink alcohol or use illicit drugs.  Family History: Family History  Problem Relation Age of Onset  . Heart disease Mother   . Diabetes Mother   . Dementia Father   . Healthy Sister   . Diabetes Brother   . Kidney cancer Brother   . Diabetes Brother   . Neuropathy Brother   . Diabetes Daughter   . Diabetes Daughter   . Diabetes Son      Physical Exam: Filed Vitals:   12/05/14 1805 12/05/14 2103 12/05/14 2313  BP: 171/64 121/51 118/68  Pulse: 50 64 53  Temp: 97.6 F (36.4 C)    TempSrc: Oral    Resp: 19 18 18   Height: 4\' 11"  (1.499 m)    Weight: 57.153 kg (126 lb)    SpO2: 100% 94% 99%   Blood pressure 118/68, pulse 53, temperature 97.6 F (36.4 C), temperature source Oral, resp. rate 18, height 4\' 11"  (1.499 m), weight 57.153 kg (126 lb), SpO2 99 %.  GEN:  Pleasant patient lying in the stretcher in no acute distress; cooperative with exam. PSYCH:  alert and oriented x4; does not appear anxious or depressed; affect is appropriate. HEENT: Mucous membranes pink and anicteric; PERRLA; EOM intact; no cervical lymphadenopathy nor thyromegaly or carotid bruit; no JVD; There were no stridor. Neck is very supple. Breasts:: Not  examined CHEST WALL: No tenderness CHEST: Normal respiration, clear to auscultation bilaterally.  HEART: Regular rate and rhythm.  There are no murmur, rub, or gallops.   BACK: No kyphosis or scoliosis; no CVA tenderness ABDOMEN: soft and non-tender; no masses, no organomegaly, normal abdominal bowel  sounds; no pannus; no intertriginous candida. There is no rebound and no distention. Rectal Exam: Not done EXTREMITIES: No bone or joint deformity; age-appropriate arthropathy of the hands and knees; no edema; no ulcerations.  There is no calf tenderness. Genitalia: not examined PULSES: 2+ and symmetric SKIN: Normal hydration no rash or ulceration CNS: Cranial nerves 2-12 grossly intact no focal lateralizing neurologic deficit.  Speech is fluent; uvula elevated with phonation, facial symmetry and tongue midline. DTR are normal bilaterally, cerebella exam is intact, barbinski is negative and strengths are equaled bilaterally.  No sensory loss.   Labs on Admission:  Basic Metabolic Panel:  Recent Labs Lab 12/05/14 1852  NA 138  K 3.5  CL 104  CO2 24  GLUCOSE 205*  BUN 13  CREATININE 0.82  CALCIUM 9.7   Liver Function Tests:  Recent Labs Lab 12/05/14 1852  AST 14*  ALT 13*  ALKPHOS 39  BILITOT 0.4  PROT 6.9  ALBUMIN 3.8    Recent Labs Lab 12/05/14 1852  LIPASE 24   No results for input(s): AMMONIA in the last 168 hours. CBC:  Recent Labs Lab 12/05/14 1852  WBC 9.6  NEUTROABS 8.1*  HGB 11.9*  HCT 36.2  MCV 94.3  PLT 170   Radiological Exams on Admission: Ct Abdomen Pelvis W Contrast  12/05/2014   CLINICAL DATA:  Right lower quadrant pain and nausea for 5 hours.  EXAM: CT ABDOMEN AND PELVIS WITH CONTRAST  TECHNIQUE: Multidetector CT imaging of the abdomen and pelvis was performed using the standard protocol following bolus administration of intravenous contrast.  CONTRAST:  17mL OMNIPAQUE IOHEXOL 300 MG/ML SOLN, 145mL OMNIPAQUE IOHEXOL 300 MG/ML SOLN  COMPARISON:   CT 02/17/2014, 04/13/2012  FINDINGS: Unchanged pleural-based nodule in the left lower lobe is unchanged dating back 2013, consistent with benign etiology. The lung bases are otherwise clear.  There is a punctate stone in the right mid ureter at the level of L4 (best appreciated on coronal reformat image 35) with mild resultant hydroureteronephrosis. Mild periureteric soft tissue stranding is seen about the ureteric stone. Nonobstructing stones in the lower left kidney without left obstructive uropathy. Urinary bladder is physiologically distended, no bladder stone. Mild bladder wall thickening appears similar to prior exam.  Postcholecystectomy with clips in the gallbladder fossa. There is unchanged biliary prominence compared to prior exam. No focal hepatic lesion. The spleen is normal. Pancreatic atrophy without ductal dilatation. The right adrenal gland is normal. Left adrenal nodule measuring 3.0 x 2.3 cm, unchanged from on prior exams.  The stomach is distended. Moderate hiatal hernia containing enteric contrast, there is an adjacent gastric diverticulum that has increased in size from prior. There are no dilated or thickened bowel loops. Post right hemicolectomy with anastomosis in the central abdomen, no associated colonic wall thickening. Mild distal diverticulosis without diverticulitis.  No retroperitoneal adenopathy. Abdominal aorta is normal in caliber. Moderate atherosclerosis of the abdominal aorta without aneurysm.  The uterus is surgically absent. No adnexal mass. No pelvic fluid. No pelvic adenopathy.  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 There is multilevel degenerative change throughout the lumbar spine.  IMPRESSION: 1. Punctate partially obstructing stone in the right mid-ureter with mild resultant proximal hydroureteronephrosis. There is mild periureteric inflammation at the site of stone. Nonobstructing  stones in the left kidney. 2. Hiatal hernia containing enteric contrast, may reflect reflux or delayed transit. Gastric cardia diverticulum, distended with contrast, as well as generalized  gastric distension, likely reflecting known history of gastroparesis. 3. Stable chronic findings include benign left adrenal adenoma, distal colonic diverticulosis without diverticulitis, and atherosclerosis.   Electronically Signed   By: Jeb Levering M.D.   On: 12/05/2014 20:43   Assessment/Plan Present on Admission:  . Fibromyalgia . Diabetic gastroparesis Nephrolithiasis with hydroureternephrosis Elevated Lactic acid.   PLAN:  I don't think elevated lactic acid means sepsis, and in this case, she has no leukocytosis, no fever, tachycardia or hypotension, and unlikely to have sepsis.  She does have right flank pain, and has a kidney stone on the right ureter, causing hydronephrosis.  She also has some nausea, and hadn't been able to eat.  Will admit her OBS and treat her symptoms.  Give IVF and repeat another lactic acid in the morning.  She has no increased anion gap nor did she have metabolic acidosis.  Metformin can cause lactic acidosis, but it rare and usually in those with renal insufficiency.  Though it is are, and she has no evidence of CKD, lactic acidosis caused by Metformin can be very dangerous.  I would recommend that her Metformin be discontinued permanently.  Topiramate can cause normal gap metabolic acidosis, and lactic acidosis as well.  She has been on Topamax for her Migraine, so I will continue it. She has been culture, and no antibiotic given.  Will give IVF and her hydronephrosis will need to be follow up.  Hopefully, she will pass her kidney stone.   Other plans as per orders.  Code Status: FULL Haskel Khan, MD. [facility] Pager [phone] 7pm to 7am.  12/05/2014, 11:24 PM

## 2014-12-05 NOTE — ED Provider Notes (Signed)
CSN: [id number]     Arrival date & time 12/05/14  1803 History   First MD Initiated Contact with Patient 12/05/14 1834     Chief Complaint  Patient presents with  . Abdominal Pain     HPI Pt was seen at 1850.  Per pt, c/o gradual onset and persistence of constant RLQ abd "pain" that began at 1500 today PTA.  Has been associated with nausea and "loose stools" this morning. Has been unable to tol PO due to nausea.  Denies vomiting/diarrhea, no fevers, no back pain, no rash, no CP/SOB, no black or blood in stools.       Past Medical History  Diagnosis Date  . Nausea   . Bloating   . Dysphagia   . Gastroesophageal reflux   . Gastroparesis   . Anemia   . TIA (transient ischemic attack)     "3-4 before starting RX; none since" (04/19/2013)  . PONV (postoperative nausea and vomiting)   . Fibromyalgia   . Renal insufficiency   . Chronic neck pain   . Dizziness   . Chronic heartburn   . Chronic pain   . Anxiety   . Family history of anesthesia complication     "daughter has PONV too" (04/19/2013)  . Hypertension     "for diabetes; don't have high blood pressure" (04/19/2013)  . Type II diabetes mellitus   . Bleeding stomach ulcer 1980's  . History of blood transfusion 1980's    "w/bleeding stomach ulcer" (04/19/2013)  . Hiatal hernia     "had it before; had OR; got it again" (04/19/2013)  . Chronic headaches   . Migraine     "take RX for it qd" (04/19/2013)  . DDD (degenerative disc disease), lumbar   . Arthritis     "all over" (04/19/2013)  . Chronic back pain   . Colon cancer   . Diabetic peripheral neuropathy     "in my feet" (04/19/2013)  . Walking pneumonia ~ 1966  . Pneumonia 04/2012  . Glaucoma, bilateral    Past Surgical History  Procedure Laterality Date  . Hemicolectomy  2010    ZIEGLER  . Colonoscopy  10/11/2010  . Colonoscopy  11/23/2009  . Colonoscopy  07/28/2008    W/SNARE  . Colonoscopy  06/28/07  . Colonoscopy  05/10/07    W/POLYP  .  Colonoscopy  12/28/00  . Upper gastrointestinal endoscopy  10/11/2010    EGD ED  . Upper gastrointestinal endoscopy  11/23/2009  . Upper gastrointestinal endoscopy  05/10/07    EGD ED  . Upper gastrointestinal endoscopy  08/11/01    EGD ED  . Posterior fusion cervical spine  1985    "had a broken neck" (04/19/2013)  . Hiatal hernia repair  1970's  . Tonsillectomy  ~ 1953  . Colon surgery    . Cholecystectomy  1980's  . Anterior cervical discectomy  ~ 2009    "only cleaned out arthritis and spurs" (04/19/2013)  . Vaginal hysterectomy    . Cataract extraction w/ intraocular lens implant Left 04/13/2013  . Abdominal adhesion surgery  ~ 2012    "repaired wrap where they did hiatal hernia OR too" 911/04/2013)  . Colonoscopy with esophagogastroduodenoscopy (egd) N/A 05/13/2013    Procedure: COLONOSCOPY WITH ESOPHAGOGASTRODUODENOSCOPY (EGD);  Surgeon: Rogene Houston, MD;  Location: [facility];  Service: Endoscopy;  Laterality: N/A;  855  . Left heart catheterization with coronary angiogram N/A 04/19/2013    Procedure: LEFT HEART CATHETERIZATION WITH CORONARY ANGIOGRAM;  Surgeon: Peter M Martinique, MD;  Location: [facility] CATH LAB;  Service: Cardiovascular;  Laterality: N/A;   Family History  Problem Relation Age of Onset  . Heart disease Mother   . Diabetes Mother   . Dementia Father   . Healthy Sister   . Diabetes Brother   . Kidney cancer Brother   . Diabetes Brother   . Neuropathy Brother   . Diabetes Daughter   . Diabetes Daughter   . Diabetes Son    History  Substance Use Topics  . Smoking status: Former Smoker -- 1.50 packs/day for 15 years    Types: Cigarettes    Quit date: 03/17/1991  . Smokeless tobacco: Never Used     Comment: Patient states that it has ben greater than 20 years since she quit smoking  . Alcohol Use: No   OB History    Gravida Para Term Preterm AB TAB SAB Ectopic Multiple Living   4 4 4             Review of Systems ROS: Statement: All systems  negative except as marked or noted in the HPI; Constitutional: Negative for fever and chills. ; ; Eyes: Negative for eye pain, redness and discharge. ; ; ENMT: Negative for ear pain, hoarseness, nasal congestion, sinus pressure and sore throat. ; ; Cardiovascular: Negative for chest pain, palpitations, diaphoresis, dyspnea and peripheral edema. ; ; Respiratory: Negative for cough, wheezing and stridor. ; ; Gastrointestinal: +nausea, loose stool, abd pain. Negative for vomiting, diarrhea, blood in stool, hematemesis, jaundice and rectal bleeding. . ; ; Genitourinary: Negative for dysuria, flank pain and hematuria. ; ; Musculoskeletal: Negative for back pain and neck pain. Negative for swelling and trauma.; ; Skin: Negative for pruritus, rash, abrasions, blisters, bruising and skin lesion.; ; Neuro: Negative for headache, lightheadedness and neck stiffness. Negative for weakness, altered level of consciousness , altered mental status, extremity weakness, paresthesias, involuntary movement, seizure and syncope.      Allergies  Morphine; Aspirin; Cefuroxime axetil; Codeine; Diltiazem; Dronabinol; Shellfish allergy; Ondansetron; and Penicillins  Home Medications   Prior to Admission medications   Medication Sig Start Date End Date Taking? Authorizing Provider  Calcium Carbonate-Vitamin D (CALCIUM + D PO) Take 1 tablet by mouth daily. TAKING 1,000 CALCIUM AND 2,000 VIT D   Yes Historical Provider, MD  cetirizine (ZYRTEC) 10 MG tablet Take 10 mg by mouth daily.   Yes Historical Provider, MD  Cyanocobalamin (VITAMIN B 12 PO) Take 2,000 mcg by mouth daily.   Yes Historical Provider, MD  docusate sodium (COLACE) 100 MG capsule Take 100 mg by mouth 2 (two) times daily.   Yes Historical Provider, MD  famotidine (PEPCID) 20 MG tablet Take 2 tablets (40 mg total) by mouth at bedtime. 07/20/12  Yes Rogene Houston, MD  fentaNYL (DURAGESIC - DOSED MCG/HR) 50 MCG/HR Place 50 mcg onto the skin every 3 (three) days.    Yes Historical Provider, MD  metFORMIN (GLUCOPHAGE) 500 MG tablet Take 500 mg by mouth daily with breakfast.   Yes Historical Provider, MD  MISC NATURAL PRODUCTS ER PO Take 1-3 capsules by mouth daily. Digestive Blend Softgels   Yes Historical Provider, MD  Multiple Vitamins-Minerals (MULTIVITAMINS THER. W/MINERALS) TABS tablet Take 1 tablet by mouth daily.   Yes Historical Provider, MD  polyethylene glycol (MIRALAX / GLYCOLAX) packet Take 17 g by mouth daily.   Yes Historical Provider, MD  ranolazine (RANEXA) 500 MG 12 hr tablet Take 1 tablet (500 mg total) by mouth  2 (two) times daily. 10/04/14  Yes Herminio Commons, MD  tamsulosin (FLOMAX) 0.4 MG CAPS capsule Take 0.4 mg by mouth every 12 (twelve) hours.   Yes Historical Provider, MD  topiramate (TOPAMAX) 100 MG tablet Take 100 mg by mouth at bedtime.   Yes Historical Provider, MD  Travoprost, BAK Free, (TRAVATAN) 0.004 % SOLN ophthalmic solution Place 1 drop into both eyes at bedtime.   Yes Historical Provider, MD  acetaminophen (TYLENOL) 500 MG tablet Take 500 mg by mouth every 6 (six) hours as needed for mild pain.     Historical Provider, MD  ALPRAZolam (XANAX XR) 1 MG 24 hr tablet Take 0.5-1 mg by mouth as needed for sleep (Anxiety).     Historical Provider, MD  metFORMIN (GLUCOPHAGE) 1000 MG tablet Take 1 tablet (1,000 mg total) by mouth daily. HOLD for 2 days, resume on 04/22/2013. Patient not taking: Reported on 12/05/2014 04/20/13   Evelene Croon Barrett, PA-C  mometasone (NASONEX) 50 MCG/ACT nasal spray Place 2 sprays into the nose daily as needed (Allergies).     Historical Provider, MD  oxyCODONE-acetaminophen (PERCOCET/ROXICET) 5-325 MG per tablet Take 1 tablet by mouth every 4 (four) hours as needed for moderate pain.    Historical Provider, MD  promethazine (PHENERGAN) 12.5 MG tablet Take 1 tablet (12.5 mg total) by mouth every 8 (eight) hours as needed for nausea. Patient not taking: Reported on 12/05/2014 07/19/13   Rogene Houston, MD    BP 171/64 mmHg  Pulse 50  Temp(Src) 97.6 F (36.4 C) (Oral)  Resp 19  Ht 4\' 11"  (1.499 m)  Wt 126 lb (57.153 kg)  BMI 25.44 kg/m2  SpO2 100% Physical Exam  1855; Physical examination:  Nursing notes reviewed; Vital signs and O2 SAT reviewed;  Constitutional: Well developed, Well nourished, Uncomfortable appearing.; Head:  Normocephalic, atraumatic; Eyes: EOMI, PERRL, No scleral icterus; ENMT: Mouth and pharynx normal, Mucous membranes dry; Neck: Supple, Full range of motion, No lymphadenopathy; Cardiovascular: Regular rate and rhythm, No gallop; Respiratory: Breath sounds clear & equal bilaterally, No wheezes.  Speaking full sentences with ease, Normal respiratory effort/excursion; Chest: Nontender, Movement normal; Abdomen: Soft, +RLQ tenderness to palp. No rebound or guarding. Nondistended, Normal bowel sounds; Genitourinary: No CVA tenderness; Spine:  No midline CS, TS, LS tenderness.;; Extremities: Pulses normal, No tenderness, No edema, No calf edema or asymmetry.; Neuro: AA&Ox3, Major CN grossly intact.  Speech clear. No gross focal motor or sensory deficits in extremities.; Skin: Color normal, Warm, Dry.   ED Course  Procedures     EKG Interpretation None      MDM  MDM Reviewed: previous chart, nursing note and vitals Reviewed previous: labs Interpretation: labs and CT scan      Results for orders placed or performed during the hospital encounter of 12/05/14  Urinalysis, Routine w reflex microscopic (not at [facility])  Result Value Ref Range   Color, Urine YELLOW YELLOW   APPearance CLEAR CLEAR   Specific Gravity, Urine 1.025 1.005 - 1.030   pH 5.5 5.0 - 8.0   Glucose, UA NEGATIVE NEGATIVE mg/dL   Hgb urine dipstick SMALL (A) NEGATIVE   Bilirubin Urine NEGATIVE NEGATIVE   Ketones, ur NEGATIVE NEGATIVE mg/dL   Protein, ur NEGATIVE NEGATIVE mg/dL   Urobilinogen, UA 0.2 0.0 - 1.0 mg/dL   Nitrite NEGATIVE NEGATIVE   Leukocytes, UA SMALL (A) NEGATIVE  Comprehensive  metabolic panel  Result Value Ref Range   Sodium 138 135 - 145 mmol/L   Potassium 3.5  3.5 - 5.1 mmol/L   Chloride 104 101 - 111 mmol/L   CO2 24 22 - 32 mmol/L   Glucose, Bld 205 (H) 65 - 99 mg/dL   BUN 13 6 - 20 mg/dL   Creatinine, Ser 0.82 0.44 - 1.00 mg/dL   Calcium 9.7 8.9 - 10.3 mg/dL   Total Protein 6.9 6.5 - 8.1 g/dL   Albumin 3.8 3.5 - 5.0 g/dL   AST 14 (L) 15 - 41 U/L   ALT 13 (L) 14 - 54 U/L   Alkaline Phosphatase 39 38 - 126 U/L   Total Bilirubin 0.4 0.3 - 1.2 mg/dL   GFR calc non Af Amer >60 >60 mL/min   GFR calc Af Amer >60 >60 mL/min   Anion gap 10 5 - 15  Lipase, blood  Result Value Ref Range   Lipase 24 22 - 51 U/L  CBC with Differential  Result Value Ref Range   WBC 9.6 4.0 - 10.5 K/uL   RBC 3.84 (L) 3.87 - 5.11 MIL/uL   Hemoglobin 11.9 (L) 12.0 - 15.0 g/dL   HCT 36.2 36.0 - 46.0 %   MCV 94.3 78.0 - 100.0 fL   MCH 31.0 26.0 - 34.0 pg   MCHC 32.9 30.0 - 36.0 g/dL   RDW 13.4 11.5 - 15.5 %   Platelets 170 150 - 400 K/uL   Neutrophils Relative % 84 (H) 43 - 77 %   Neutro Abs 8.1 (H) 1.7 - 7.7 K/uL   Lymphocytes Relative 14 12 - 46 %   Lymphs Abs 1.3 0.7 - 4.0 K/uL   Monocytes Relative 2 (L) 3 - 12 %   Monocytes Absolute 0.2 0.1 - 1.0 K/uL   Eosinophils Relative 0 0 - 5 %   Eosinophils Absolute 0.0 0.0 - 0.7 K/uL   Basophils Relative 0 0 - 1 %   Basophils Absolute 0.0 0.0 - 0.1 K/uL  Lactic acid, plasma  Result Value Ref Range   Lactic Acid, Venous 2.7 (HH) 0.5 - 2.0 mmol/L  Urine microscopic-add on  Result Value Ref Range   Squamous Epithelial / LPF MANY (A) RARE   WBC, UA 3-6 <3 WBC/hpf   RBC / HPF 7-10 <3 RBC/hpf   Bacteria, UA MANY (A) RARE   Crystals CA OXALATE CRYSTALS (A) NEGATIVE   Urine-Other AMORPHOUS URATES/PHOSPHATES    Ct Abdomen Pelvis W Contrast 12/05/2014   CLINICAL DATA:  Right lower quadrant pain and nausea for 5 hours.  EXAM: CT ABDOMEN AND PELVIS WITH CONTRAST  TECHNIQUE: Multidetector CT imaging of the abdomen and pelvis was  performed using the standard protocol following bolus administration of intravenous contrast.  CONTRAST:  43mL OMNIPAQUE IOHEXOL 300 MG/ML SOLN, 119mL OMNIPAQUE IOHEXOL 300 MG/ML SOLN  COMPARISON:  CT 02/17/2014, 04/13/2012  FINDINGS: Unchanged pleural-based nodule in the left lower lobe is unchanged dating back 2013, consistent with benign etiology. The lung bases are otherwise clear.  There is a punctate stone in the right mid ureter at the level of L4 (best appreciated on coronal reformat image 35) with mild resultant hydroureteronephrosis. Mild periureteric soft tissue stranding is seen about the ureteric stone. Nonobstructing stones in the lower left kidney without left obstructive uropathy. Urinary bladder is physiologically distended, no bladder stone. Mild bladder wall thickening appears similar to prior exam.  Postcholecystectomy with clips in the gallbladder fossa. There is unchanged biliary prominence compared to prior exam. No focal hepatic lesion. The spleen is normal. Pancreatic atrophy without ductal dilatation. The right adrenal  gland is normal. Left adrenal nodule measuring 3.0 x 2.3 cm, unchanged from on prior exams.  The stomach is distended. Moderate hiatal hernia containing enteric contrast, there is an adjacent gastric diverticulum that has increased in size from prior. There are no dilated or thickened bowel loops. Post right hemicolectomy with anastomosis in the central abdomen, no associated colonic wall thickening. Mild distal diverticulosis without diverticulitis.  No retroperitoneal adenopathy. Abdominal aorta is normal in caliber. Moderate atherosclerosis of the abdominal aorta without aneurysm.  The uterus is surgically absent. No adnexal mass. No pelvic fluid. No pelvic adenopathy.  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 There is multilevel degenerative change throughout the lumbar spine.  IMPRESSION:  1. Punctate partially obstructing stone in the right mid-ureter with mild resultant proximal hydroureteronephrosis. There is mild periureteric inflammation at the site of stone. Nonobstructing stones in the left kidney. 2. Hiatal hernia containing enteric contrast, may reflect reflux or delayed transit. Gastric cardia diverticulum, distended with contrast, as well as generalized gastric distension, likely reflecting known history of gastroparesis. 3. Stable chronic findings include benign left adrenal adenoma, distal colonic diverticulosis without diverticulitis, and atherosclerosis.   Electronically Signed   By: Jeb Levering M.D.   On: 12/05/2014 20:43    2210:  CT scan with punctate right sided ureteral calculus. No clear UTI on Udip; UC is pending. Remains afebrile while in the ED. Lactic acid trending upward despite IVF; will continue IVF and observation admit. Dx and testing d/w pt and family.  Questions answered.  Verb understanding, agreeable to admit.  T/C to [facility]. [place] Comment, case discussed, including:  HPI, pertinent PM/SHx, VS/PE, dx testing, ED course and treatment:  Agreeable to admit, requests he will come to the ED for evaluation.    Francine Graven, DO 12/07/14 [phone]

## 2014-12-06 ENCOUNTER — Observation Stay (HOSPITAL_COMMUNITY): Payer: Medicare Other

## 2014-12-06 DIAGNOSIS — E1143 Type 2 diabetes mellitus with diabetic autonomic (poly)neuropathy: Secondary | ICD-10-CM | POA: Diagnosis not present

## 2014-12-06 DIAGNOSIS — N200 Calculus of kidney: Secondary | ICD-10-CM

## 2014-12-06 DIAGNOSIS — N201 Calculus of ureter: Secondary | ICD-10-CM | POA: Insufficient documentation

## 2014-12-06 DIAGNOSIS — E872 Acidosis: Secondary | ICD-10-CM

## 2014-12-06 LAB — GLUCOSE, CAPILLARY
Glucose-Capillary: 137 mg/dL — ABNORMAL HIGH (ref 65–99)
Glucose-Capillary: 125 mg/dL — ABNORMAL HIGH (ref 65–99)
Glucose-Capillary: 144 mg/dL — ABNORMAL HIGH (ref 65–99)
Glucose-Capillary: 128 mg/dL — ABNORMAL HIGH (ref 65–99)

## 2014-12-06 LAB — LACTIC ACID, PLASMA: Lactic Acid, Venous: 1.7 mmol/L (ref 0.5–2.0)

## 2014-12-06 MED ORDER — TOPIRAMATE 100 MG PO TABS
100.0000 mg | ORAL_TABLET | Freq: Every day | ORAL | Status: DC
  Administered 2014-12-06: 100 mg via ORAL
  Filled 2014-12-06 (×3): qty 1

## 2014-12-06 MED ORDER — TAMSULOSIN HCL 0.4 MG PO CAPS
0.4000 mg | ORAL_CAPSULE | Freq: Two times a day (BID) | ORAL | Status: DC
  Administered 2014-12-06 (×2): 0.4 mg via ORAL
  Filled 2014-12-06 (×2): qty 1

## 2014-12-06 MED ORDER — INSULIN ASPART 100 UNIT/ML ~~LOC~~ SOLN: 0.0000 [IU] | Freq: Every day | SUBCUTANEOUS | Status: DC

## 2014-12-06 MED ORDER — ALPRAZOLAM ER 0.5 MG PO TB24: 0.5000 mg | ORAL_TABLET | ORAL | Status: DC | PRN

## 2014-12-06 MED ORDER — VITAMIN B-12 1000 MCG PO TABS
2000.0000 ug | ORAL_TABLET | Freq: Every day | ORAL | Status: DC
  Administered 2014-12-06: 2000 ug via ORAL
  Filled 2014-12-06: qty 2

## 2014-12-06 MED ORDER — INSULIN ASPART 100 UNIT/ML ~~LOC~~ SOLN
0.0000 [IU] | Freq: Three times a day (TID) | SUBCUTANEOUS | Status: DC
  Administered 2014-12-06 (×3): 1 [IU] via SUBCUTANEOUS

## 2014-12-06 MED ORDER — SODIUM CHLORIDE 0.9 % IV SOLN
INTRAVENOUS | Status: DC
  Administered 2014-12-06: 125 mL/h via INTRAVENOUS
  Administered 2014-12-06: 10:00:00 via INTRAVENOUS

## 2014-12-06 MED ORDER — FENTANYL 50 MCG/HR TD PT72
50.0000 ug | MEDICATED_PATCH | TRANSDERMAL | Status: DC
  Administered 2014-12-06: 50 ug via TRANSDERMAL
  Filled 2014-12-06: qty 1

## 2014-12-06 MED ORDER — FLUTICASONE PROPIONATE 50 MCG/ACT NA SUSP
1.0000 | Freq: Every day | NASAL | Status: DC
  Filled 2014-12-06: qty 16

## 2014-12-06 MED ORDER — RANOLAZINE ER 500 MG PO TB12
500.0000 mg | ORAL_TABLET | Freq: Two times a day (BID) | ORAL | Status: DC
  Administered 2014-12-06 (×2): 500 mg via ORAL
  Filled 2014-12-06 (×2): qty 1

## 2014-12-06 MED ORDER — ALPRAZOLAM 0.5 MG PO TABS: 0.5000 mg | ORAL_TABLET | Freq: Every evening | ORAL | Status: DC | PRN

## 2014-12-06 MED ORDER — FAMOTIDINE 20 MG PO TABS
40.0000 mg | ORAL_TABLET | Freq: Every day | ORAL | Status: DC
  Administered 2014-12-06: 40 mg via ORAL
  Filled 2014-12-06: qty 2

## 2014-12-06 MED ORDER — METOCLOPRAMIDE HCL 10 MG PO TABS
10.0000 mg | ORAL_TABLET | Freq: Three times a day (TID) | ORAL | Status: DC
  Administered 2014-12-06: 10 mg via ORAL
  Filled 2014-12-06 (×2): qty 1

## 2014-12-06 MED ORDER — ENOXAPARIN SODIUM 40 MG/0.4ML ~~LOC~~ SOLN
40.0000 mg | SUBCUTANEOUS | Status: DC
  Administered 2014-12-06: 40 mg via SUBCUTANEOUS
  Filled 2014-12-06: qty 0.4

## 2014-12-06 MED ORDER — POLYETHYLENE GLYCOL 3350 17 G PO PACK
17.0000 g | PACK | Freq: Every day | ORAL | Status: DC
  Administered 2014-12-06: 17 g via ORAL
  Filled 2014-12-06: qty 1

## 2014-12-06 MED ORDER — LATANOPROST 0.005 % OP SOLN
OPHTHALMIC | Status: AC
  Filled 2014-12-06: qty 2.5

## 2014-12-06 MED ORDER — PANTOPRAZOLE SODIUM 40 MG PO TBEC
40.0000 mg | DELAYED_RELEASE_TABLET | Freq: Every day | ORAL | Status: DC
  Administered 2014-12-06: 40 mg via ORAL
  Filled 2014-12-06: qty 1

## 2014-12-06 MED ORDER — DOCUSATE SODIUM 100 MG PO CAPS
100.0000 mg | ORAL_CAPSULE | Freq: Two times a day (BID) | ORAL | Status: DC
  Administered 2014-12-06 (×2): 100 mg via ORAL
  Filled 2014-12-06 (×2): qty 1

## 2014-12-06 MED ORDER — TOPIRAMATE 25 MG PO TABS
ORAL_TABLET | ORAL | Status: AC
  Filled 2014-12-06: qty 4

## 2014-12-06 MED ORDER — METOCLOPRAMIDE HCL 10 MG PO TABS: 10.0000 mg | ORAL_TABLET | Freq: Three times a day (TID) | ORAL | Status: DC

## 2014-12-06 MED ORDER — OXYCODONE-ACETAMINOPHEN 5-325 MG PO TABS
1.0000 | ORAL_TABLET | ORAL | Status: DC | PRN
  Administered 2014-12-06: 1 via ORAL
  Filled 2014-12-06: qty 1

## 2014-12-06 MED ORDER — LATANOPROST 0.005 % OP SOLN
1.0000 [drp] | Freq: Every day | OPHTHALMIC | Status: DC
  Administered 2014-12-06: 1 [drp] via OPHTHALMIC
  Filled 2014-12-06: qty 2.5

## 2014-12-06 NOTE — Progress Notes (Signed)
Discharged to home with family.

## 2014-12-06 NOTE — Care Management Note (Signed)
Case Management Note  Patient Details  Name: [name] MRN: [id number] Date of Birth: December 13, 1940  Expected Discharge Date:                  Expected Discharge Plan:  Home/Self Care  In-House Referral:  NA  Discharge planning Services  CM Consult  Post Acute Care Choice:  NA Choice offered to:  NA  DME Arranged:    DME Agency:     HH Arranged:    [place] Agency:     Status of Service:  Completed, signed off  Medicare Important Message Given:    Date Medicare IM Given:    Medicare IM give by:    Date Additional Medicare IM Given:    Additional Medicare Important Message give by:     If discussed at [place] of Stay Meetings, dates discussed:    Additional Comments: Pt is from home, lives alone and is independent at baseline. Pt still drives. Pt uses a cane and has a walker if needed. Pt has no HH services or DME needs prior admission. Pt plans to discharge home with self care. Anticipate DC in next 24 hours. Pt given Medicare OBS notification, document signed, copy given to patient and original placed in patients shadow chart.  Sherald Barge, RN 12/06/2014, 11:26 AM

## 2014-12-06 NOTE — Discharge Summary (Signed)
Physician Discharge Summary  [name] WVP:[id number] DOB: [birth year] DOA: 12/05/2014  PCP: Robert Bellow, MD  Admit date: 12/05/2014 Discharge date: 12/06/2014  Time spent: 30 minutes  Recommendations for Outpatient Follow-up:  1. Follow up with urology in 1 week  Discharge Diagnoses:  Principal Problem:   Nephrolithiasis Active Problems:   Diabetic gastroparesis   Fibromyalgia   Elevated lactic acid level   Ureteral calculus, right   Discharge Condition: improved  Diet recommendation: low salt, low carb  Filed Weights   12/05/14 1805 12/06/14 0045  Weight: 57.153 kg (126 lb) 58.378 kg (128 lb 11.2 oz)    History of present illness:  This patient presented to the hospital with a one-day history of right flank pain. She denies any fever, chills, dysuria. She was noted to have an elevated lactic acid. CT scan of the eyes and pelvis indicated right-sided hydroureteronephrosis. She was admitted for further treatment  Hospital Course:  Patient was started on intravenous fluids. She did not have any fever or leukocytosis during her hospital stay. With adequate hydration, her lactic acidosis resolved. Metformin has been discontinued. She was found to have a punctate calculus in her right ureter with mild hydroureteronephrosis. This was discussed with urology who recommended outpatient follow-up in one week. Since she did not have any dysuria and urinalysis was not indicative of infection, she was not placed on any antibiotic. Further diabetes management will need to be addressed with primary care physician, although would avoid metformin due to significant lactic acidosis. Patient is feeling significantly improved and back to baseline. She can be discharged home for outpatient follow-up.  Procedures:    Consultations:    Discharge Exam: Filed Vitals:   12/06/14 1325  BP: 129/69  Pulse: 57  Temp: 98 F (36.7 C)  Resp: 18    General: NAD Cardiovascular: S1, S2  RRR Respiratory: CTA B  Discharge Instructions   Discharge Instructions    Diet - low sodium heart healthy    Complete by:  As directed      Increase activity slowly    Complete by:  As directed           Discharge Medication List as of 12/06/2014  5:26 PM    START taking these medications   Details  metoCLOPramide (REGLAN) 10 MG tablet Take 1 tablet (10 mg total) by mouth 3 (three) times daily before meals., Starting 12/06/2014, Until Discontinued, No Print      CONTINUE these medications which have NOT CHANGED   Details  Calcium Carbonate-Vitamin D (CALCIUM + D PO) Take 1 tablet by mouth daily. TAKING 1,000 CALCIUM AND 2,000 VIT D, Until Discontinued, Historical Med    cetirizine (ZYRTEC) 10 MG tablet Take 10 mg by mouth daily., Until Discontinued, Historical Med    Cyanocobalamin (VITAMIN B 12 PO) Take 2,000 mcg by mouth daily., Until Discontinued, Historical Med    docusate sodium (COLACE) 100 MG capsule Take 100 mg by mouth 2 (two) times daily., Until Discontinued, Historical Med    famotidine (PEPCID) 20 MG tablet Take 2 tablets (40 mg total) by mouth at bedtime., Starting 07/20/2012, Until Discontinued, Print    fentaNYL (DURAGESIC - DOSED MCG/HR) 50 MCG/HR Place 50 mcg onto the skin every 3 (three) days., Until Discontinued, Historical Med    MISC NATURAL PRODUCTS ER PO Take 1-3 capsules by mouth daily. Digestive Blend Softgels, Until Discontinued, Historical Med    Multiple Vitamins-Minerals (MULTIVITAMINS THER. W/MINERALS) TABS tablet Take 1 tablet by mouth daily., Until  Discontinued, Historical Med    polyethylene glycol (MIRALAX / GLYCOLAX) packet Take 17 g by mouth daily., Until Discontinued, Historical Med    ranolazine (RANEXA) 500 MG 12 hr tablet Take 1 tablet (500 mg total) by mouth 2 (two) times daily., Starting 10/04/2014, Until Discontinued, Normal    tamsulosin (FLOMAX) 0.4 MG CAPS capsule Take 0.4 mg by mouth every 12 (twelve) hours., Until Discontinued,  Historical Med    topiramate (TOPAMAX) 100 MG tablet Take 100 mg by mouth at bedtime., Until Discontinued, Historical Med    Travoprost, BAK Free, (TRAVATAN) 0.004 % SOLN ophthalmic solution Place 1 drop into both eyes at bedtime., Until Discontinued, Historical Med    acetaminophen (TYLENOL) 500 MG tablet Take 500 mg by mouth every 6 (six) hours as needed for mild pain. , Until Discontinued, Historical Med    ALPRAZolam (XANAX XR) 1 MG 24 hr tablet Take 0.5-1 mg by mouth as needed for sleep (Anxiety). , Until Discontinued, Historical Med    mometasone (NASONEX) 50 MCG/ACT nasal spray Place 2 sprays into the nose daily as needed (Allergies). , Until Discontinued, Historical Med    oxyCODONE-acetaminophen (PERCOCET/ROXICET) 5-325 MG per tablet Take 1 tablet by mouth every 4 (four) hours as needed for moderate pain., Until Discontinued, Historical Med      STOP taking these medications     metFORMIN (GLUCOPHAGE) 500 MG tablet      metFORMIN (GLUCOPHAGE) 1000 MG tablet      promethazine (PHENERGAN) 12.5 MG tablet        Allergies  Allergen Reactions  . Morphine Anaphylaxis and Other (See Comments)    "it will kill me" "made me stop breathing"  . Aspirin Other (See Comments)    Gi bleed   . Cefuroxime Axetil Nausea And Vomiting  . Codeine Nausea And Vomiting  . Diltiazem Nausea And Vomiting  . Dronabinol Nausea And Vomiting and Palpitations    Patient also states that she had palpatations  . Shellfish Allergy Other (See Comments)    Blood sugar drops  . Ondansetron Hives  . Penicillins Rash   Follow-up Information    Follow up with MACDIARMID,SCOTT A, MD.   Specialty:  Urology   Why:  call tomorrow for appointment   Contact information:   [place] [address] [phone]        The results of significant diagnostics from this hospitalization (including imaging, microbiology, ancillary and laboratory) are listed below for reference.    Significant  Diagnostic Studies: Dg Chest 2 View  12/06/2014   CLINICAL DATA:  Elevated lactic acid.  Anxiety.  EXAM: CHEST  2 VIEW  COMPARISON:  04/19/2013 and 03/25/2013  FINDINGS: Both lungs are clear. Heart and mediastinum are within normal limits. Again noted is a surgical plate in the lower cervical spine. Oral contrast in the left colon from recent CT exam. No acute bone abnormality. No large pleural effusions.  IMPRESSION: No active cardiopulmonary disease.   Electronically Signed   By: Markus Daft M.D.   On: 12/06/2014 08:23   Ct Abdomen Pelvis W Contrast  12/05/2014   CLINICAL DATA:  Right lower quadrant pain and nausea for 5 hours.  EXAM: CT ABDOMEN AND PELVIS WITH CONTRAST  TECHNIQUE: Multidetector CT imaging of the abdomen and pelvis was performed using the standard protocol following bolus administration of intravenous contrast.  CONTRAST:  79mL OMNIPAQUE IOHEXOL 300 MG/ML SOLN, 147mL OMNIPAQUE IOHEXOL 300 MG/ML SOLN  COMPARISON:  CT 02/17/2014, 04/13/2012  FINDINGS: Unchanged pleural-based  nodule in the left lower lobe is unchanged dating back 2013, consistent with benign etiology. The lung bases are otherwise clear.  There is a punctate stone in the right mid ureter at the level of L4 (best appreciated on coronal reformat image 35) with mild resultant hydroureteronephrosis. Mild periureteric soft tissue stranding is seen about the ureteric stone. Nonobstructing stones in the lower left kidney without left obstructive uropathy. Urinary bladder is physiologically distended, no bladder stone. Mild bladder wall thickening appears similar to prior exam.  Postcholecystectomy with clips in the gallbladder fossa. There is unchanged biliary prominence compared to prior exam. No focal hepatic lesion. The spleen is normal. Pancreatic atrophy without ductal dilatation. The right adrenal gland is normal. Left adrenal nodule measuring 3.0 x 2.3 cm, unchanged from on prior exams.  The stomach is distended. Moderate hiatal  hernia containing enteric contrast, there is an adjacent gastric diverticulum that has increased in size from prior. There are no dilated or thickened bowel loops. Post right hemicolectomy with anastomosis in the central abdomen, no associated colonic wall thickening. Mild distal diverticulosis without diverticulitis.  No retroperitoneal adenopathy. Abdominal aorta is normal in caliber. Moderate atherosclerosis of the abdominal aorta without aneurysm.  The uterus is surgically absent. No adnexal mass. No pelvic fluid. No pelvic adenopathy.  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 There is multilevel degenerative change throughout the lumbar spine.  IMPRESSION: 1. Punctate partially obstructing stone in the right mid-ureter with mild resultant proximal hydroureteronephrosis. There is mild periureteric inflammation at the site of stone. Nonobstructing stones in the left kidney. 2. Hiatal hernia containing enteric contrast, may reflect reflux or delayed transit. Gastric cardia diverticulum, distended with contrast, as well as generalized gastric distension, likely reflecting known history of gastroparesis. 3. Stable chronic findings include benign left adrenal adenoma, distal colonic diverticulosis without diverticulitis, and atherosclerosis.   Electronically Signed   By: Jeb Levering M.D.   On: 12/05/2014 20:43   Dg Epidural/nerve Root  11/08/2014   CLINICAL DATA:  Spondylosis without myelopathy. Spondylolisthesis L5-S1 with bilateral foraminal stenosis. Bilateral leg pain, worse on the right than the left.  EXAM: Bilateral L5 nerve root blocks and trans foraminal epidural  COMPARISON:  MRI 10/27/2014  TECHNIQUE: The overlying skin was prepped with Betadine and draped in sterile fashion. Skin anesthesia was carried out using 1% Lidocaine. A curved 22 gauge spinal needle was directed into the superior ventral neural foramen on  each side at the L5-S1 level. Injection of a few drops of Omnipaque 180 demonstrates spread outlining the L5 nerve root on each side. No intravascular extension. 60 mg Depo-Medrol and 1cc 1% Lidocaine were subsequently administered on each side. No apparent complication. The patient tolerated the procedure without difficulty and was transferred to recovery in excellent condition.  FLUOROSCOPY TIME:  1 minutes 59 seconds.  0.619 Gy cm squared  IMPRESSION: Technically successful bilateral L5 selective nerve root block and transforaminal epidural steroid injection.   Electronically Signed   By: Nelson Chimes M.D.   On: 11/08/2014 13:14   Dg Epidural/nerve Root  11/08/2014   CLINICAL DATA:  Spondylosis without myelopathy. Spondylolisthesis L5-S1 with bilateral foraminal stenosis. Bilateral leg pain, worse on the right than the left.  EXAM: Bilateral L5 nerve root blocks and trans foraminal epidural  COMPARISON:  MRI 10/27/2014  TECHNIQUE: The overlying skin was prepped with Betadine and draped in sterile fashion. Skin anesthesia was carried out using 1% Lidocaine. A  curved 22 gauge spinal needle was directed into the superior ventral neural foramen on each side at the L5-S1 level. Injection of a few drops of Omnipaque 180 demonstrates spread outlining the L5 nerve root on each side. No intravascular extension. 60 mg Depo-Medrol and 1cc 1% Lidocaine were subsequently administered on each side. No apparent complication. The patient tolerated the procedure without difficulty and was transferred to recovery in excellent condition.  FLUOROSCOPY TIME:  1 minutes 59 seconds.  0.619 Gy cm squared  IMPRESSION: Technically successful bilateral L5 selective nerve root block and transforaminal epidural steroid injection.   Electronically Signed   By: Nelson Chimes M.D.   On: 11/08/2014 13:14    Microbiology: No results found for this or any previous visit (from the past 240 hour(s)).   Labs: Basic Metabolic Panel:  Recent  Labs Lab 12/05/14 1852  NA 138  K 3.5  CL 104  CO2 24  GLUCOSE 205*  BUN 13  CREATININE 0.82  CALCIUM 9.7   Liver Function Tests:  Recent Labs Lab 12/05/14 1852  AST 14*  ALT 13*  ALKPHOS 39  BILITOT 0.4  PROT 6.9  ALBUMIN 3.8    Recent Labs Lab 12/05/14 1852  LIPASE 24   No results for input(s): AMMONIA in the last 168 hours. CBC:  Recent Labs Lab 12/05/14 1852  WBC 9.6  NEUTROABS 8.1*  HGB 11.9*  HCT 36.2  MCV 94.3  PLT 170   Cardiac Enzymes: No results for input(s): CKTOTAL, CKMB, CKMBINDEX, TROPONINI in the last 168 hours. BNP: BNP (last 3 results) No results for input(s): BNP in the last 8760 hours.  ProBNP (last 3 results) No results for input(s): PROBNP in the last 8760 hours.  CBG:  Recent Labs Lab 12/06/14 0204 12/06/14 0759 12/06/14 1129 12/06/14 1642  GLUCAP 137* 125* 144* 128*       Signed:  MEMON,JEHANZEB  [facility] 12/06/2014, 7:59 PM

## 2014-12-18 ENCOUNTER — Other Ambulatory Visit: Payer: Self-pay | Admitting: Neurosurgery

## 2014-12-18 DIAGNOSIS — M4317 Spondylolisthesis, lumbosacral region: Secondary | ICD-10-CM

## 2014-12-21 ENCOUNTER — Ambulatory Visit
Admission: RE | Admit: 2014-12-21 | Discharge: 2014-12-21 | Disposition: A | Discharge status: Home / Self Care | Payer: Medicare Other | Source: Ambulatory Visit | Attending: Neurosurgery | Admitting: Neurosurgery

## 2014-12-21 VITALS — BP 148/75 | HR 51

## 2014-12-21 DIAGNOSIS — Q762 Congenital spondylolisthesis: Secondary | ICD-10-CM

## 2014-12-21 DIAGNOSIS — M4317 Spondylolisthesis, lumbosacral region: Secondary | ICD-10-CM

## 2014-12-21 MED ORDER — IOHEXOL 180 MG/ML  SOLN
1.0000 mL | Freq: Once | INTRAMUSCULAR | Status: AC | PRN
Start: 2014-12-21 — End: 2014-12-21
  Administered 2014-12-21: 1 mL via INTRA_ARTICULAR

## 2014-12-21 MED ORDER — METHYLPREDNISOLONE ACETATE 40 MG/ML INJ SUSP (RADIOLOG
120.0000 mg | Freq: Once | INTRAMUSCULAR | Status: AC
  Administered 2014-12-21: 120 mg via INTRA_ARTICULAR

## 2014-12-25 ENCOUNTER — Ambulatory Visit (INDEPENDENT_AMBULATORY_CARE_PROVIDER_SITE_OTHER): Payer: Medicare Other | Admitting: Internal Medicine

## 2014-12-25 ENCOUNTER — Encounter (INDEPENDENT_AMBULATORY_CARE_PROVIDER_SITE_OTHER): Payer: Self-pay | Admitting: Internal Medicine

## 2014-12-25 VITALS — BP 138/80 | HR 58 | Temp 98.5°F | Resp 18 | Ht 61.0 in | Wt 123.6 lb

## 2014-12-25 DIAGNOSIS — K3184 Gastroparesis: Principal | ICD-10-CM

## 2014-12-25 DIAGNOSIS — K219 Gastro-esophageal reflux disease without esophagitis: Secondary | ICD-10-CM | POA: Diagnosis not present

## 2014-12-25 DIAGNOSIS — Z862 Personal history of diseases of the blood and blood-forming organs and certain disorders involving the immune mechanism: Secondary | ICD-10-CM

## 2014-12-25 DIAGNOSIS — Z85038 Personal history of other malignant neoplasm of large intestine: Secondary | ICD-10-CM | POA: Diagnosis not present

## 2014-12-25 DIAGNOSIS — E1143 Type 2 diabetes mellitus with diabetic autonomic (poly)neuropathy: Secondary | ICD-10-CM | POA: Diagnosis not present

## 2014-12-25 MED ORDER — DEXLANSOPRAZOLE 60 MG PO CPDR: 60.0000 mg | DELAYED_RELEASE_CAPSULE | Freq: Every day | ORAL | Status: DC

## 2014-12-25 MED ORDER — METOCLOPRAMIDE HCL 10 MG PO TABS: 10.0000 mg | ORAL_TABLET | Freq: Three times a day (TID) | ORAL | Status: DC

## 2014-12-25 MED ORDER — FAMOTIDINE 20 MG PO TABS: 40.0000 mg | ORAL_TABLET | Freq: Every day | ORAL | Status: DC

## 2014-12-25 NOTE — Progress Notes (Signed)
Presenting complaint;  Follow-up for gastroparesis, GERD and iron deficiency anemia.  Subjective:  [name] is 74 year old Caucasian female who presents for scheduled visit. She was last seen 6 months ago. She feels she is doing fair. She has intermittent nausea with certain foods but hasn't vomited recently. She states first Botox injection to her pylorus help great deal but the second one did not. His planning to call Dr. Kari Baars office as scheduled visit. She is trying to eat 4-5 small meals. He has heartburn and regurgitation with certain foods. She denies dysphagia abdominal pain or frank rectal bleeding. Stools have been black since she has been on iron. She is having intermittent hematochezia with her bowel movements. She is seeing Dr. Romona Curls for treatment of her hemorrhoids. She generally has 3 bowel movements per week. She has chronic back pain but she is able to walk fairly well with help of a cane. Her weight is down by 3 pounds since her last visit but she states her weight is close to her baseline. In May last year she weighed 120 pounds. He was hospitalized 3 weeks ago for urolithiasis. She is scheduled to be seen by urologist as an few weeks.    Current Medications: Outpatient Encounter Prescriptions as of 12/25/2014  Medication Sig  . acetaminophen (TYLENOL) 500 MG tablet Take 500 mg by mouth every 6 (six) hours as needed for mild pain.   Marland Kitchen ALPRAZolam (XANAX XR) 1 MG 24 hr tablet Take 0.5-1 mg by mouth as needed for sleep (Anxiety).   . Calcium Carbonate-Vitamin D (CALCIUM + D PO) Take 1 tablet by mouth daily. TAKING 1,000 CALCIUM AND 2,000 VIT D  . cetirizine (ZYRTEC) 10 MG tablet Take 10 mg by mouth daily.  . Cyanocobalamin (VITAMIN B 12 PO) Take 2,000 mcg by mouth daily.  Marland Kitchen dexlansoprazole (DEXILANT) 60 MG capsule Take 60 mg by mouth daily before breakfast.  . docusate sodium (COLACE) 100 MG capsule Take 100 mg by mouth 2 (two) times daily.  . famotidine (PEPCID) 20 MG tablet Take  2 tablets (40 mg total) by mouth at bedtime.  . fentaNYL (DURAGESIC - DOSED MCG/HR) 50 MCG/HR Place 50 mcg onto the skin every 3 (three) days.  . metoCLOPramide (REGLAN) 10 MG tablet Take 1 tablet (10 mg total) by mouth 3 (three) times daily before meals.  Marland Kitchen MISC NATURAL PRODUCTS ER PO Take 1-3 capsules by mouth daily. Digestive Blend Softgels  . mometasone (NASONEX) 50 MCG/ACT nasal spray Place 2 sprays into the nose daily as needed (Allergies).   . Multiple Vitamins-Minerals (MULTIVITAMINS THER. W/MINERALS) TABS tablet Take 1 tablet by mouth daily.  Marland Kitchen oxyCODONE-acetaminophen (PERCOCET/ROXICET) 5-325 MG per tablet Take 1 tablet by mouth every 4 (four) hours as needed for moderate pain.  . polyethylene glycol (MIRALAX / GLYCOLAX) packet Take 17 g by mouth daily.  . ranolazine (RANEXA) 500 MG 12 hr tablet Take 1 tablet (500 mg total) by mouth 2 (two) times daily.  . tamsulosin (FLOMAX) 0.4 MG CAPS capsule Take 0.4 mg by mouth every 12 (twelve) hours.  . topiramate (TOPAMAX) 100 MG tablet Take 100 mg by mouth at bedtime.  . Travoprost, BAK Free, (TRAVATAN) 0.004 % SOLN ophthalmic solution Place 1 drop into both eyes at bedtime.   No facility-administered encounter medications on file as of 12/25/2014.     Objective: Blood pressure 138/80, pulse 58, temperature 98.5 F (36.9 C), temperature source Oral, resp. rate 18, height 5\' 1"  (1.549 m), weight 123 lb 9.6 oz (56.065 kg).  Patient is alert and in no acute distress. Conjunctiva is pink. Sclera is nonicteric. Abnormal movement or tremor noted to lips or tongue. Oropharyngeal mucosa is normal. No neck masses or thyromegaly noted. Cardiac exam with regular rhythm normal S1 and S2. No murmur or gallop noted. Lungs are clear to auscultation. Abdomen is symmetrical. Bowel sounds are normal. No bruits noted. On palpation abdomen is soft with mild midepigastric tenderness. No organomegaly or masses.  No LE edema or clubbing noted. No extremity  tremors noted.  Labs/studies Results: Blood work from 2014/12/22  WBC 9.6, H&H 11.9 and 36.2 and platelet count 170K  BUN 13, creatinine 0.82  Glucose 205  Bilirubin 0.4, AP 39, AST 14, ALT 13 and albumin 3.8  Serum calcium 9.7   Assessment:  #1. Iron deficiency anemia. H&H is close to normal. She remains on by mouth iron. Iron deficiency anemia appears to be due to impaired iron absorption due to chronic PPI therapy. Last EGD and colonoscopy was in December 2014. #2. Gastroparesis. Symptoms are well controlled as long as she is compliant with her diet. She has required Botox therapy to pyloric channel on 2 occasions. If symptoms relapse she may benefit from further treatment in which case she will contact Dr. Derrill Kay of [facility] #3. GERD. Heartburn is  well controlled with therapy. #4. History of colon carcinoma. She is up-to-date on surveillance with last TCS in December 2014. Next surveillance colonoscopy would be in December 2019. #5. History of H. pylori gastritis treated with Pylera in December 2014.    Plan:  New prescription given for Dexlansoprazole 60 mg by mouth daily 90 day supply with 3 refills. New prescription also given for metoclopramide 10 mg by mouth 3 times a day 90 day supply with 1 refill. Patient will check with Dr. Karie Kirks if it is time for her to get bone density study. Office visit in 6 months.

## 2014-12-25 NOTE — Patient Instructions (Signed)
Talk with Dr. Karie Kirks about getting bone density study. Few start to experience side effects of metoclopramide, stop this medication and call office.

## 2014-12-26 ENCOUNTER — Ambulatory Visit (INDEPENDENT_AMBULATORY_CARE_PROVIDER_SITE_OTHER): Payer: Medicare Other | Admitting: Internal Medicine

## 2015-01-12 ENCOUNTER — Other Ambulatory Visit (HOSPITAL_COMMUNITY): Payer: Self-pay | Admitting: Family Medicine

## 2015-01-12 DIAGNOSIS — M8580 Other specified disorders of bone density and structure, unspecified site: Secondary | ICD-10-CM

## 2015-01-18 ENCOUNTER — Other Ambulatory Visit (HOSPITAL_COMMUNITY): Payer: Medicare Other

## 2015-01-19 ENCOUNTER — Ambulatory Visit (HOSPITAL_COMMUNITY)
Admission: RE | Admit: 2015-01-19 | Discharge: 2015-01-19 | Disposition: A | Discharge status: Home / Self Care | Payer: Medicare Other | Source: Ambulatory Visit | Attending: Family Medicine | Admitting: Family Medicine

## 2015-01-19 DIAGNOSIS — M899 Disorder of bone, unspecified: Secondary | ICD-10-CM | POA: Diagnosis not present

## 2015-01-19 DIAGNOSIS — Z780 Asymptomatic menopausal state: Secondary | ICD-10-CM | POA: Diagnosis not present

## 2015-01-19 DIAGNOSIS — M8580 Other specified disorders of bone density and structure, unspecified site: Secondary | ICD-10-CM

## 2015-02-13 DIAGNOSIS — E872 Acidosis, unspecified: Secondary | ICD-10-CM | POA: Insufficient documentation

## 2015-05-07 ENCOUNTER — Ambulatory Visit: Payer: Medicare Other | Admitting: Diagnostic Neuroimaging

## 2015-06-07 ENCOUNTER — Emergency Department (HOSPITAL_COMMUNITY)
Admission: EM | Admit: 2015-06-07 | Discharge: 2015-06-07 | Disposition: A | Discharge status: Home / Self Care | Payer: Medicare Other | Attending: Emergency Medicine | Admitting: Emergency Medicine

## 2015-06-07 ENCOUNTER — Encounter (HOSPITAL_COMMUNITY): Payer: Self-pay

## 2015-06-07 ENCOUNTER — Emergency Department (HOSPITAL_COMMUNITY): Payer: Medicare Other

## 2015-06-07 DIAGNOSIS — H409 Unspecified glaucoma: Secondary | ICD-10-CM | POA: Insufficient documentation

## 2015-06-07 DIAGNOSIS — Z87891 Personal history of nicotine dependence: Secondary | ICD-10-CM | POA: Diagnosis not present

## 2015-06-07 DIAGNOSIS — Z8701 Personal history of pneumonia (recurrent): Secondary | ICD-10-CM | POA: Diagnosis not present

## 2015-06-07 DIAGNOSIS — W108XXA Fall (on) (from) other stairs and steps, initial encounter: Secondary | ICD-10-CM | POA: Diagnosis not present

## 2015-06-07 DIAGNOSIS — Y998 Other external cause status: Secondary | ICD-10-CM | POA: Insufficient documentation

## 2015-06-07 DIAGNOSIS — G43909 Migraine, unspecified, not intractable, without status migrainosus: Secondary | ICD-10-CM | POA: Diagnosis not present

## 2015-06-07 DIAGNOSIS — Z85038 Personal history of other malignant neoplasm of large intestine: Secondary | ICD-10-CM | POA: Insufficient documentation

## 2015-06-07 DIAGNOSIS — E119 Type 2 diabetes mellitus without complications: Secondary | ICD-10-CM | POA: Insufficient documentation

## 2015-06-07 DIAGNOSIS — K3184 Gastroparesis: Secondary | ICD-10-CM | POA: Diagnosis not present

## 2015-06-07 DIAGNOSIS — D649 Anemia, unspecified: Secondary | ICD-10-CM | POA: Diagnosis not present

## 2015-06-07 DIAGNOSIS — Z87448 Personal history of other diseases of urinary system: Secondary | ICD-10-CM | POA: Insufficient documentation

## 2015-06-07 DIAGNOSIS — Y9389 Activity, other specified: Secondary | ICD-10-CM | POA: Insufficient documentation

## 2015-06-07 DIAGNOSIS — Z79899 Other long term (current) drug therapy: Secondary | ICD-10-CM | POA: Diagnosis not present

## 2015-06-07 DIAGNOSIS — F419 Anxiety disorder, unspecified: Secondary | ICD-10-CM | POA: Insufficient documentation

## 2015-06-07 DIAGNOSIS — Z880 Allergy status to penicillin: Secondary | ICD-10-CM | POA: Insufficient documentation

## 2015-06-07 DIAGNOSIS — Z8739 Personal history of other diseases of the musculoskeletal system and connective tissue: Secondary | ICD-10-CM | POA: Insufficient documentation

## 2015-06-07 DIAGNOSIS — S20229A Contusion of unspecified back wall of thorax, initial encounter: Secondary | ICD-10-CM

## 2015-06-07 DIAGNOSIS — S300XXA Contusion of lower back and pelvis, initial encounter: Secondary | ICD-10-CM

## 2015-06-07 DIAGNOSIS — I10 Essential (primary) hypertension: Secondary | ICD-10-CM | POA: Insufficient documentation

## 2015-06-07 DIAGNOSIS — K219 Gastro-esophageal reflux disease without esophagitis: Secondary | ICD-10-CM | POA: Insufficient documentation

## 2015-06-07 DIAGNOSIS — Z8673 Personal history of transient ischemic attack (TIA), and cerebral infarction without residual deficits: Secondary | ICD-10-CM | POA: Insufficient documentation

## 2015-06-07 DIAGNOSIS — G8929 Other chronic pain: Secondary | ICD-10-CM | POA: Insufficient documentation

## 2015-06-07 DIAGNOSIS — Y9289 Other specified places as the place of occurrence of the external cause: Secondary | ICD-10-CM | POA: Diagnosis not present

## 2015-06-07 DIAGNOSIS — E1140 Type 2 diabetes mellitus with diabetic neuropathy, unspecified: Secondary | ICD-10-CM | POA: Insufficient documentation

## 2015-06-07 DIAGNOSIS — S3992XA Unspecified injury of lower back, initial encounter: Secondary | ICD-10-CM | POA: Diagnosis present

## 2015-06-07 NOTE — ED Provider Notes (Signed)
CSN: [id number]     Arrival date & time 06/07/15  1227 History   First MD Initiated Contact with Patient 06/07/15 1340     Chief Complaint  Patient presents with  . Back Pain     (Consider location/radiation/quality/duration/timing/severity/associated sxs/prior Treatment) Patient is a 74 y.o. female presenting with back pain. The history is provided by the patient. No language interpreter was used.  Back Pain Location:  Lumbar spine and gluteal region Quality:  Aching Radiates to:  Does not radiate Pain severity:  Moderate Pain is:  Same all the time Onset quality:  Gradual Duration:  1 week Timing:  Constant Progression:  Worsening Chronicity:  New Context: falling   Relieved by:  Nothing Worsened by:  Nothing tried Ineffective treatments:  None tried Risk factors: hx of osteoporosis   pt fell down stairs and hit her bottom and low back.   Past Medical History  Diagnosis Date  . Nausea   . Bloating   . Dysphagia   . Gastroesophageal reflux   . Gastroparesis   . Anemia   . TIA (transient ischemic attack)     "3-4 before starting RX; none since" (04/19/2013)  . PONV (postoperative nausea and vomiting)   . Fibromyalgia   . Renal insufficiency   . Chronic neck pain   . Dizziness   . Chronic heartburn   . Chronic pain   . Anxiety   . Family history of anesthesia complication     "daughter has PONV too" (04/19/2013)  . Hypertension     "for diabetes; don't have high blood pressure" (04/19/2013)  . Type II diabetes mellitus ([place])   . Bleeding stomach ulcer 1980's  . History of blood transfusion 1980's    "w/bleeding stomach ulcer" (04/19/2013)  . Hiatal hernia     "had it before; had OR; got it again" (04/19/2013)  . Chronic headaches   . Migraine     "take RX for it qd" (04/19/2013)  . DDD (degenerative disc disease), lumbar   . Arthritis     "all over" (04/19/2013)  . Chronic back pain   . Colon cancer ([place])   . Diabetic peripheral neuropathy ([place])      "in my feet" (04/19/2013)  . Walking pneumonia ~ 1966  . Pneumonia 04/2012  . Glaucoma, bilateral    Past Surgical History  Procedure Laterality Date  . Hemicolectomy  2010    ZIEGLER  . Colonoscopy  10/11/2010  . Colonoscopy  11/23/2009  . Colonoscopy  07/28/2008    W/SNARE  . Colonoscopy  06/28/07  . Colonoscopy  05/10/07    W/POLYP  . Colonoscopy  12/28/00  . Upper gastrointestinal endoscopy  10/11/2010    EGD ED  . Upper gastrointestinal endoscopy  11/23/2009  . Upper gastrointestinal endoscopy  05/10/07    EGD ED  . Upper gastrointestinal endoscopy  08/11/01    EGD ED  . Posterior fusion cervical spine  1985    "had a broken neck" (04/19/2013)  . Hiatal hernia repair  1970's  . Tonsillectomy  ~ 1953  . Colon surgery    . Cholecystectomy  1980's  . Anterior cervical discectomy  ~ 2009    "only cleaned out arthritis and spurs" (04/19/2013)  . Vaginal hysterectomy    . Cataract extraction w/ intraocular lens implant Left 04/13/2013  . Abdominal adhesion surgery  ~ 2012    "repaired wrap where they did hiatal hernia OR too" 911/04/2013)  . Colonoscopy with esophagogastroduodenoscopy (egd) N/A 05/13/2013  Procedure: COLONOSCOPY WITH ESOPHAGOGASTRODUODENOSCOPY (EGD);  Surgeon: Rogene Houston, MD;  Location: [facility];  Service: Endoscopy;  Laterality: N/A;  855  . Left heart catheterization with coronary angiogram N/A 04/19/2013    Procedure: LEFT HEART CATHETERIZATION WITH CORONARY ANGIOGRAM;  Surgeon: Peter M Martinique, MD;  Location: [facility] CATH LAB;  Service: Cardiovascular;  Laterality: N/A;   Family History  Problem Relation Age of Onset  . Heart disease Mother   . Diabetes Mother   . Dementia Father   . Healthy Sister   . Diabetes Brother   . Kidney cancer Brother   . Diabetes Brother   . Neuropathy Brother   . Diabetes Daughter   . Diabetes Daughter   . Diabetes Son    Social History  Substance Use Topics  . Smoking status: Former Smoker -- 1.50 packs/day  for 15 years    Types: Cigarettes    Quit date: 03/17/1991  . Smokeless tobacco: Never Used     Comment: Patient states that it has ben greater than 20 years since she quit smoking  . Alcohol Use: No   OB History    Gravida Para Term Preterm AB TAB SAB Ectopic Multiple Living   4 4 4             Review of Systems  Musculoskeletal: Positive for back pain.  All other systems reviewed and are negative.     Allergies  Morphine; Aspirin; Cefuroxime axetil; Codeine; Diltiazem; Dronabinol; Shellfish allergy; Ondansetron; and Penicillins  Home Medications   Prior to Admission medications   Medication Sig Start Date End Date Taking? Authorizing Provider  acetaminophen (TYLENOL) 500 MG tablet Take 500 mg by mouth every 6 (six) hours as needed for mild pain.    Yes Historical Provider, MD  ALPRAZolam (XANAX XR) 1 MG 24 hr tablet Take 0.5-1 mg by mouth as needed for sleep (Anxiety).    Yes Historical Provider, MD  Calcium Carbonate-Vitamin D (CALCIUM + D PO) Take 1 tablet by mouth daily. TAKING 1,000 CALCIUM AND 2,000 VIT D   Yes Historical Provider, MD  Cyanocobalamin (VITAMIN B 12 PO) Take 2,000 mcg by mouth daily.   Yes Historical Provider, MD  dexlansoprazole (DEXILANT) 60 MG capsule Take 1 capsule (60 mg total) by mouth daily before breakfast. 12/25/14  Yes Rogene Houston, MD  docusate sodium (COLACE) 100 MG capsule Take 100 mg by mouth daily.    Yes Historical Provider, MD  famotidine (PEPCID) 20 MG tablet Take 2 tablets (40 mg total) by mouth at bedtime. 12/25/14  Yes Rogene Houston, MD  fentaNYL (DURAGESIC - DOSED MCG/HR) 50 MCG/HR Place 50 mcg onto the skin every 3 (three) days.   Yes Historical Provider, MD  metoCLOPramide (REGLAN) 10 MG tablet Take 1 tablet (10 mg total) by mouth 3 (three) times daily before meals. 12/25/14  Yes Rogene Houston, MD  MISC NATURAL PRODUCTS ER PO Take 1-3 capsules by mouth 2 (two) times daily. Digestive Blend Softgels   Yes Historical Provider, MD   mometasone (NASONEX) 50 MCG/ACT nasal spray Place 2 sprays into the nose daily as needed (Allergies).    Yes Historical Provider, MD  Multiple Vitamins-Minerals (MULTIVITAMINS THER. W/MINERALS) TABS tablet Take 1 tablet by mouth daily.   Yes Historical Provider, MD  oxyCODONE-acetaminophen (PERCOCET/ROXICET) 5-325 MG per tablet Take 1 tablet by mouth every 4 (four) hours as needed for moderate pain.   Yes Historical Provider, MD  polyethylene glycol (MIRALAX / GLYCOLAX) packet Take 17 g  by mouth daily.   Yes Historical Provider, MD  ranolazine (RANEXA) 500 MG 12 hr tablet Take 1 tablet (500 mg total) by mouth 2 (two) times daily. 10/04/14  Yes Herminio Commons, MD  tamsulosin (FLOMAX) 0.4 MG CAPS capsule Take 0.4 mg by mouth every 12 (twelve) hours.   Yes Historical Provider, MD  topiramate (TOPAMAX) 100 MG tablet Take 100 mg by mouth at bedtime.   Yes Historical Provider, MD  Travoprost, BAK Free, (TRAVATAN) 0.004 % SOLN ophthalmic solution Place 1 drop into both eyes at bedtime.   Yes Historical Provider, MD   BP 123/65 mmHg  Pulse 60  Temp(Src) 98.4 F (36.9 C) (Oral)  Resp 20  Ht 4\' 11"  (1.499 m)  Wt 57.153 kg  BMI 25.44 kg/m2  SpO2 98% Physical Exam  Constitutional: She is oriented to person, place, and time. She appears well-developed and well-nourished.  HENT:  Head: Normocephalic.  Eyes: EOM are normal.  Neck: Normal range of motion.  Pulmonary/Chest: Effort normal.  Abdominal: She exhibits no distension.  Musculoskeletal:  Tender lower lumbar and sacral area,  Tender lower pelvic bone posteriorly.    Neurological: She is alert and oriented to person, place, and time.  Psychiatric: She has a normal mood and affect.  Nursing note and vitals reviewed.   ED Course  Procedures (including critical care time) Labs Review Labs Reviewed - No data to display  Imaging Review Dg Lumbar Spine Complete  06/07/2015  CLINICAL DATA:  Fall 4 days ago with low back pain and  bilateral hip pain. EXAM: LUMBAR SPINE - COMPLETE 4+ VIEW COMPARISON:  KUB 02/27/2015 and CT 12/05/2014 FINDINGS: Mild curvature of the thoracolumbar spine convex left unchanged. There is moderate spondylosis of the lumbar spine. Vertebral body heights are within normal. There is stable grade 2 anterolisthesis of L5 on S1 due to known L5 bilateral spondylolysis with severely narrowed L5-S1 disc space. Mild disc space narrowing at the L1-2 and L2-3 levels. Moderate facet arthropathy over the lower lumbar spine. Calcified plaque over the abdominal aorta. Surgical clips over the right upper quadrant as well as a surgical suture line over the left upper quadrant. Mild degenerate change of the hips. IMPRESSION: Moderate spondylosis of the lumbar spine with mild curvature convex left. Disc space narrowing at the L1-2 and L2-3 levels. Stable grade 2 anterolisthesis of L5 on S1 due to bilateral L5 spondylolysis with severe disc space narrowing at the L5-S1 level. Electronically Signed   By: Marin Olp M.D.   On: 06/07/2015 14:30   Dg Pelvis 1-2 Views  06/07/2015  CLINICAL DATA:  Status post fall on Monday. EXAM: PELVIS - 1-2 VIEW COMPARISON:  None. FINDINGS: There is no evidence of pelvic fracture or diastasis. There is generalized osteopenia. No pelvic bone lesions are seen. There are degenerative changes of bilateral SI joints. IMPRESSION: No acute osseous injury of the pelvis. Electronically Signed   By: Kathreen Devoid   On: 06/07/2015 14:30   I have personally reviewed and evaluated these images and lab results as part of my medical decision-making.   EKG Interpretation None      MDM   Final diagnoses:  Contusion, back, unspecified laterality, initial encounter  Pelvic contusion, initial encounter    Pt is in pain management.   I will not change medications. I advised recheck with primary.    Hollace Kinnier [place], PA-C 06/07/15 1444  Merrily Pew, MD 06/07/15 [phone]

## 2015-06-07 NOTE — ED Notes (Signed)
Pt states she was going down steps and fell backwards. Complain of pain in lower back

## 2015-06-07 NOTE — Discharge Instructions (Signed)

## 2015-06-26 ENCOUNTER — Ambulatory Visit (INDEPENDENT_AMBULATORY_CARE_PROVIDER_SITE_OTHER): Payer: Medicare Other | Admitting: Internal Medicine

## 2015-06-26 ENCOUNTER — Encounter (INDEPENDENT_AMBULATORY_CARE_PROVIDER_SITE_OTHER): Payer: Self-pay | Admitting: *Deleted

## 2015-06-26 ENCOUNTER — Encounter (INDEPENDENT_AMBULATORY_CARE_PROVIDER_SITE_OTHER): Payer: Self-pay | Admitting: Internal Medicine

## 2015-06-26 ENCOUNTER — Other Ambulatory Visit (INDEPENDENT_AMBULATORY_CARE_PROVIDER_SITE_OTHER): Payer: Self-pay | Admitting: Internal Medicine

## 2015-06-26 VITALS — BP 122/70 | HR 72 | Temp 97.6°F | Resp 18 | Ht 61.0 in | Wt 129.5 lb

## 2015-06-26 DIAGNOSIS — K219 Gastro-esophageal reflux disease without esophagitis: Secondary | ICD-10-CM | POA: Diagnosis not present

## 2015-06-26 DIAGNOSIS — Z85038 Personal history of other malignant neoplasm of large intestine: Secondary | ICD-10-CM

## 2015-06-26 DIAGNOSIS — K3184 Gastroparesis: Secondary | ICD-10-CM

## 2015-06-26 DIAGNOSIS — D509 Iron deficiency anemia, unspecified: Secondary | ICD-10-CM

## 2015-06-26 DIAGNOSIS — E1143 Type 2 diabetes mellitus with diabetic autonomic (poly)neuropathy: Secondary | ICD-10-CM | POA: Diagnosis not present

## 2015-06-26 DIAGNOSIS — K625 Hemorrhage of anus and rectum: Secondary | ICD-10-CM

## 2015-06-26 MED ORDER — METOCLOPRAMIDE HCL 10 MG PO TABS: 10.0000 mg | ORAL_TABLET | Freq: Three times a day (TID) | ORAL | Status: DC

## 2015-06-26 NOTE — Progress Notes (Signed)
Presenting complaint;  Patient complains of rectal bleeding. Follow-up for GERD and gastroparesis  Subjective:  Patient is 75 year old Caucasian female who is here for scheduled visit. She has chronic GERD, diabetic gastroparesis and history of colorectal carcinoma and her last surveillance colonoscopy was in December 2014. She also has history of iron deficiency anemia and her hemoglobin in June 2016 was 11.9. She has multiple complaints. For the last few weeks she has been passing blood per rectum with most of her bowel movements. At times she passes large amount of blood. She denies constipation. Every now and then she has diarrhea. She was seen Dr. Romona Curls and underwent ultra therapy for hemorrhoids. She did not finish treatment because Dr. Romona Curls close his practice. He give her name of another surgeon in [place] but she has knots arrange follow-up. She does not remember name of the surgeon. She also complains of frequent regurgitation and heartburn. She feels she needs Botox therapy which has helped her in the past. Last treatment session was in March 2016 by Dr. Derrill Kay of [facility]. She denies nausea or vomiting. She also denies melena. She is not having any side effects with metoclopramide. She has gained 6 pounds since her last visit 6 months ago.    Current Medications: Outpatient Encounter Prescriptions as of 06/26/2015  Medication Sig  . acetaminophen (TYLENOL) 500 MG tablet Take 500 mg by mouth every 6 (six) hours as needed for mild pain.   Marland Kitchen ALPRAZolam (XANAX XR) 1 MG 24 hr tablet Take 0.5-1 mg by mouth as needed for sleep (Anxiety).   . Calcium Carbonate-Vitamin D (CALCIUM + D PO) Take 1 tablet by mouth daily. TAKING 1,000 CALCIUM AND 2,000 VIT D  . Cyanocobalamin (VITAMIN B 12 PO) Take 2,000 mcg by mouth daily.  Marland Kitchen dexlansoprazole (DEXILANT) 60 MG capsule Take 1 capsule (60 mg total) by mouth daily before breakfast.  . docusate sodium (COLACE) 100 MG capsule Take 100 mg by mouth  daily.   . DULoxetine (CYMBALTA) 60 MG capsule Take 60 mg by mouth daily.  . famotidine (PEPCID) 20 MG tablet Take 2 tablets (40 mg total) by mouth at bedtime.  . fentaNYL (DURAGESIC - DOSED MCG/HR) 50 MCG/HR Place 50 mcg onto the skin every 3 (three) days.  . metFORMIN (GLUMETZA) 500 MG (MOD) 24 hr tablet Take 500 mg by mouth daily with breakfast.  . metoCLOPramide (REGLAN) 10 MG tablet Take 1 tablet (10 mg total) by mouth 3 (three) times daily before meals.  Marland Kitchen MISC NATURAL PRODUCTS ER PO Take 1-3 capsules by mouth 2 (two) times daily. Digestive Blend Softgels  . mometasone (NASONEX) 50 MCG/ACT nasal spray Place 2 sprays into the nose daily as needed (Allergies).   . Multiple Vitamins-Minerals (MULTIVITAMINS THER. W/MINERALS) TABS tablet Take 1 tablet by mouth daily.  Marland Kitchen oxyCODONE-acetaminophen (PERCOCET/ROXICET) 5-325 MG per tablet Take 1 tablet by mouth every 4 (four) hours as needed for moderate pain.  . polyethylene glycol (MIRALAX / GLYCOLAX) packet Take 17 g by mouth daily.  . ranolazine (RANEXA) 500 MG 12 hr tablet Take 1 tablet (500 mg total) by mouth 2 (two) times daily.  . tamsulosin (FLOMAX) 0.4 MG CAPS capsule Take 0.4 mg by mouth every 12 (twelve) hours.  . topiramate (TOPAMAX) 100 MG tablet Take 100 mg by mouth at bedtime.  . Travoprost, BAK Free, (TRAVATAN) 0.004 % SOLN ophthalmic solution Place 1 drop into both eyes at bedtime.  . [DISCONTINUED] metoCLOPramide (REGLAN) 10 MG tablet Take 1 tablet (10 mg total) by  mouth 3 (three) times daily before meals.   No facility-administered encounter medications on file as of 06/26/2015.     Objective: Blood pressure 122/70, pulse 72, temperature 97.6 F (36.4 C), temperature source Oral, resp. rate 18, height 5\' 1"  (1.549 m), weight 129 lb 8 oz (58.741 kg). Patient is alert and in no acute distress. Conjunctiva is pink. Sclera is nonicteric Oropharyngeal mucosa is normal. She does not have abnormal movements or lips or tongue. No  neck masses or thyromegaly noted. Cardiac exam with regular rhythm normal S1 and S2. No murmur or gallop noted. Lungs are clear to auscultation. Abdomen is soft and nontender without organomegaly or masses. Rectal examination reveals prominent soft sentinel skin tacks. Digital exam reveals normal sphincter tone. Scant amount of stool in the vault and it is guaiac negative. No LE edema or clubbing noted. No tremors noted to hands.  Labs/studies Results: H&H was 11.9 and 36.2 on 12/05/2014.    Assessment:  #1. Rectal bleeding possibly secondary to hemorrhoids but given history of colon carcinoma other lesions need to be ruled out. Last colonoscopy was in December 2014. #2. History of iron deficiency anemia. H&H was almost normal 6 months ago. CBC will be repeated. #3. History of diabetic gastroparesis. Symptoms have relapsed and she may need repeat Botox therapy to pyloric channel. She will contact Dr.Koch's office.    Plan:  CBC. New prescription given for metoclopramide 10 mg 3 times a day three-month supply with 1 refill. Diagnostic colonoscopy. Patient will directly contact Dr. Alyson Ingles office to discuss whether or not she would need another Botox therapy to pyloric channel. Office visit in 6 months.

## 2015-06-26 NOTE — Patient Instructions (Addendum)
Colonoscopy to be scheduled. 

## 2015-06-27 LAB — CBC
HCT: 34.6 % — ABNORMAL LOW (ref 36.0–46.0)
Hemoglobin: 10.9 g/dL — ABNORMAL LOW (ref 12.0–15.0)
MCH: 29.1 pg (ref 26.0–34.0)
MCHC: 31.5 g/dL (ref 30.0–36.0)
MCV: 92.3 fL (ref 78.0–100.0)
MPV: 11.9 fL (ref 8.6–12.4)
Platelets: 220 10*3/uL (ref 150–400)
RBC: 3.75 MIL/uL — ABNORMAL LOW (ref 3.87–5.11)
RDW: 13.5 % (ref 11.5–15.5)
WBC: 7.4 10*3/uL (ref 4.0–10.5)

## 2015-06-29 ENCOUNTER — Encounter (HOSPITAL_COMMUNITY): Payer: Self-pay | Admitting: *Deleted

## 2015-06-29 ENCOUNTER — Ambulatory Visit (HOSPITAL_COMMUNITY)
Admission: RE | Admit: 2015-06-29 | Discharge: 2015-06-29 | Disposition: A | Discharge status: Home / Self Care | Payer: Medicare Other | Source: Ambulatory Visit | Attending: Internal Medicine | Admitting: Internal Medicine

## 2015-06-29 ENCOUNTER — Encounter (HOSPITAL_COMMUNITY)
Admission: RE | Disposition: A | Discharge status: Home / Self Care | Payer: Self-pay | Source: Ambulatory Visit | Attending: Internal Medicine

## 2015-06-29 DIAGNOSIS — Z9049 Acquired absence of other specified parts of digestive tract: Secondary | ICD-10-CM | POA: Diagnosis not present

## 2015-06-29 DIAGNOSIS — Z7984 Long term (current) use of oral hypoglycemic drugs: Secondary | ICD-10-CM | POA: Insufficient documentation

## 2015-06-29 DIAGNOSIS — D125 Benign neoplasm of sigmoid colon: Secondary | ICD-10-CM | POA: Diagnosis not present

## 2015-06-29 DIAGNOSIS — K921 Melena: Secondary | ICD-10-CM | POA: Diagnosis present

## 2015-06-29 DIAGNOSIS — D509 Iron deficiency anemia, unspecified: Secondary | ICD-10-CM

## 2015-06-29 DIAGNOSIS — I10 Essential (primary) hypertension: Secondary | ICD-10-CM | POA: Insufficient documentation

## 2015-06-29 DIAGNOSIS — K644 Residual hemorrhoidal skin tags: Secondary | ICD-10-CM | POA: Insufficient documentation

## 2015-06-29 DIAGNOSIS — K219 Gastro-esophageal reflux disease without esophagitis: Secondary | ICD-10-CM | POA: Insufficient documentation

## 2015-06-29 DIAGNOSIS — K648 Other hemorrhoids: Secondary | ICD-10-CM | POA: Diagnosis not present

## 2015-06-29 DIAGNOSIS — Z79899 Other long term (current) drug therapy: Secondary | ICD-10-CM | POA: Insufficient documentation

## 2015-06-29 DIAGNOSIS — F419 Anxiety disorder, unspecified: Secondary | ICD-10-CM | POA: Diagnosis not present

## 2015-06-29 DIAGNOSIS — D128 Benign neoplasm of rectum: Secondary | ICD-10-CM

## 2015-06-29 DIAGNOSIS — Z85038 Personal history of other malignant neoplasm of large intestine: Secondary | ICD-10-CM

## 2015-06-29 DIAGNOSIS — M1990 Unspecified osteoarthritis, unspecified site: Secondary | ICD-10-CM | POA: Diagnosis not present

## 2015-06-29 DIAGNOSIS — Z87891 Personal history of nicotine dependence: Secondary | ICD-10-CM | POA: Diagnosis not present

## 2015-06-29 DIAGNOSIS — K5730 Diverticulosis of large intestine without perforation or abscess without bleeding: Secondary | ICD-10-CM | POA: Insufficient documentation

## 2015-06-29 DIAGNOSIS — E1142 Type 2 diabetes mellitus with diabetic polyneuropathy: Secondary | ICD-10-CM | POA: Diagnosis not present

## 2015-06-29 DIAGNOSIS — K625 Hemorrhage of anus and rectum: Secondary | ICD-10-CM

## 2015-06-29 HISTORY — PX: COLONOSCOPY: SHX5424

## 2015-06-29 LAB — GLUCOSE, CAPILLARY: Glucose-Capillary: 138 mg/dL — ABNORMAL HIGH (ref 65–99)

## 2015-06-29 SURGERY — COLONOSCOPY
Anesthesia: Moderate Sedation

## 2015-06-29 MED ORDER — HYDROCORTISONE ACETATE 25 MG RE SUPP: 25.0000 mg | Freq: Every day | RECTAL | Status: DC

## 2015-06-29 MED ORDER — STERILE WATER FOR IRRIGATION IR SOLN
Status: DC | PRN
  Administered 2015-06-29: 2.5 mL

## 2015-06-29 MED ORDER — MEPERIDINE HCL 50 MG/ML IJ SOLN
INTRAMUSCULAR | Status: AC
  Filled 2015-06-29: qty 1

## 2015-06-29 MED ORDER — MIDAZOLAM HCL 5 MG/5ML IJ SOLN
INTRAMUSCULAR | Status: DC | PRN
  Administered 2015-06-29 (×2): 2 mg via INTRAVENOUS

## 2015-06-29 MED ORDER — MIDAZOLAM HCL 5 MG/5ML IJ SOLN
INTRAMUSCULAR | Status: AC
  Filled 2015-06-29: qty 10

## 2015-06-29 MED ORDER — SODIUM CHLORIDE 0.9 % IV SOLN
INTRAVENOUS | Status: DC
  Administered 2015-06-29: 1000 mL via INTRAVENOUS

## 2015-06-29 MED ORDER — MEPERIDINE HCL 50 MG/ML IJ SOLN
INTRAMUSCULAR | Status: DC | PRN
  Administered 2015-06-29 (×2): 25 mg via INTRAVENOUS

## 2015-06-29 NOTE — Discharge Instructions (Signed)
Resume usual medications and high fiber diet. Anusol HC suppository 1 per rectum daily at bedtime. No driving for 24 hours. Physician will call with biopsy results. Colonoscopy, Care After These instructions give you information on caring for yourself after your procedure. Your doctor may also give you more specific instructions. Call your doctor if you have any problems or questions after your procedure. HOME CARE  Do not drive for 24 hours.  Do not sign important papers or use machinery for 24 hours.  You may shower.  You may go back to your usual activities, but go slower for the first 24 hours.  Take rest breaks often during the first 24 hours.  Walk around or use warm packs on your belly (abdomen) if you have belly cramping or gas.  Drink enough fluids to keep your pee (urine) clear or pale yellow.  Resume your normal diet. Avoid heavy or fried foods.  Avoid drinking alcohol for 24 hours or as told by your doctor.  Only take medicines as told by your doctor. If a tissue sample (biopsy) was taken during the procedure:   Do not take aspirin or blood thinners for 7 days, or as told by your doctor.  Do not drink alcohol for 7 days, or as told by your doctor.  Eat soft foods for the first 24 hours. GET HELP IF: You still have a small amount of blood in your poop (stool) 2-3 days after the procedure. GET HELP RIGHT AWAY IF:  You have more than a small amount of blood in your poop.  You see clumps of tissue (blood clots) in your poop.  Your belly is puffy (swollen).  You feel sick to your stomach (nauseous) or throw up (vomit).  You have a fever.  You have belly pain that gets worse and medicine does not help. MAKE SURE YOU:  Understand these instructions.  Will watch your condition.  Will get help right away if you are not doing well or get worse.   This information is not intended to replace advice given to you by your health care provider. Make sure you  discuss any questions you have with your health care provider.   Document Released: 06/28/2010 Document Revised: 05/31/2013 Document Reviewed: 01/31/2013 Elsevier Interactive Patient Education 2016 [employer]  Colon Polyps Polyps are lumps of extra tissue growing inside the body. Polyps can grow in the large intestine (colon). Most colon polyps are noncancerous (benign). However, some colon polyps can become cancerous over time. Polyps that are larger than a pea may be harmful. To be safe, caregivers remove and test all polyps. CAUSES  Polyps form when mutations in the genes cause your cells to grow and divide even though no more tissue is needed. RISK FACTORS There are a number of risk factors that can increase your chances of getting colon polyps. They include:  Being older than 50 years.  Family history of colon polyps or colon cancer.  Long-term colon diseases, such as colitis or Crohn disease.  Being overweight.  Smoking.  Being inactive.  Drinking too much alcohol. SYMPTOMS  Most small polyps do not cause symptoms. If symptoms are present, they may include:  Blood in the stool. The stool may look dark red or black.  Constipation or diarrhea that lasts longer than 1 week. DIAGNOSIS People often do not know they have polyps until their caregiver finds them during a regular checkup. Your caregiver can use 4 tests to check for polyps:  Digital rectal exam. The  caregiver wears gloves and feels inside the rectum. This test would find polyps only in the rectum.  Barium enema. The caregiver puts a liquid called barium into your rectum before taking X-rays of your colon. Barium makes your colon look white. Polyps are dark, so they are easy to see in the X-ray pictures.  Sigmoidoscopy. A thin, flexible tube (sigmoidoscope) is placed into your rectum. The sigmoidoscope has a light and tiny camera in it. The caregiver uses the sigmoidoscope to look at the last third of your  colon.  Colonoscopy. This test is like sigmoidoscopy, but the caregiver looks at the entire colon. This is the most common method for finding and removing polyps. TREATMENT  Any polyps will be removed during a sigmoidoscopy or colonoscopy. The polyps are then tested for cancer. PREVENTION  To help lower your risk of getting more colon polyps:  Eat plenty of fruits and vegetables. Avoid eating fatty foods.  Do not smoke.  Avoid drinking alcohol.  Exercise every day.  Lose weight if recommended by your caregiver.  Eat plenty of calcium and folate. Foods that are rich in calcium include milk, cheese, and broccoli. Foods that are rich in folate include chickpeas, kidney beans, and spinach. HOME CARE INSTRUCTIONS Keep all follow-up appointments as directed by your caregiver. You may need periodic exams to check for polyps. SEEK MEDICAL CARE IF: You notice bleeding during a bowel movement.   This information is not intended to replace advice given to you by your health care provider. Make sure you discuss any questions you have with your health care provider.   Document Released: 02/20/2004 Document Revised: 06/16/2014 Document Reviewed: 08/05/2011 Elsevier Interactive Patient Education 2016 [employer].  Hemorrhoids Hemorrhoids are swollen veins around the rectum or anus. There are two types of hemorrhoids:   Internal hemorrhoids. These occur in the veins just inside the rectum. They may poke through to the outside and become irritated and painful.  External hemorrhoids. These occur in the veins outside the anus and can be felt as a painful swelling or hard lump near the anus. CAUSES  Pregnancy.   Obesity.   Constipation or diarrhea.   Straining to have a bowel movement.   Sitting for long periods on the toilet.  Heavy lifting or other activity that caused you to strain.  Anal intercourse. SYMPTOMS   Pain.   Anal itching or irritation.   Rectal bleeding.    Fecal leakage.   Anal swelling.   One or more lumps around the anus.  DIAGNOSIS  Your caregiver may be able to diagnose hemorrhoids by visual examination. Other examinations or tests that may be performed include:   Examination of the rectal area with a gloved hand (digital rectal exam).   Examination of anal canal using a small tube (scope).   A blood test if you have lost a significant amount of blood.  A test to look inside the colon (sigmoidoscopy or colonoscopy). TREATMENT Most hemorrhoids can be treated at home. However, if symptoms do not seem to be getting better or if you have a lot of rectal bleeding, your caregiver may perform a procedure to help make the hemorrhoids get smaller or remove them completely. Possible treatments include:   Placing a rubber band at the base of the hemorrhoid to cut off the circulation (rubber band ligation).   Injecting a chemical to shrink the hemorrhoid (sclerotherapy).   Using a tool to burn the hemorrhoid (infrared light therapy).   Surgically removing the hemorrhoid (  hemorrhoidectomy).   Stapling the hemorrhoid to block blood flow to the tissue (hemorrhoid stapling).  HOME CARE INSTRUCTIONS   Eat foods with fiber, such as whole grains, beans, nuts, fruits, and vegetables. Ask your doctor about taking products with added fiber in them (fibersupplements).  Increase fluid intake. Drink enough water and fluids to keep your urine clear or pale yellow.   Exercise regularly.   Go to the bathroom when you have the urge to have a bowel movement. Do not wait.   Avoid straining to have bowel movements.   Keep the anal area dry and clean. Use wet toilet paper or moist towelettes after a bowel movement.   Medicated creams and suppositories may be used or applied as directed.   Only take over-the-counter or prescription medicines as directed by your caregiver.   Take warm sitz baths for 15-20 minutes, 3-4 times a day  to ease pain and discomfort.   Place ice packs on the hemorrhoids if they are tender and swollen. Using ice packs between sitz baths may be helpful.   Put ice in a plastic bag.   Place a towel between your skin and the bag.   Leave the ice on for 15-20 minutes, 3-4 times a day.   Do not use a donut-shaped pillow or sit on the toilet for long periods. This increases blood pooling and pain.  SEEK MEDICAL CARE IF:  You have increasing pain and swelling that is not controlled by treatment or medicine.  You have uncontrolled bleeding.  You have difficulty or you are unable to have a bowel movement.  You have pain or inflammation outside the area of the hemorrhoids. MAKE SURE YOU:  Understand these instructions.  Will watch your condition.  Will get help right away if you are not doing well or get worse.   This information is not intended to replace advice given to you by your health care provider. Make sure you discuss any questions you have with your health care provider.   Document Released: 05/23/2000 Document Revised: 05/12/2012 Document Reviewed: 03/30/2012 Elsevier Interactive Patient Education 2016 [employer].  Diverticulosis Diverticulosis is the condition that develops when small pouches (diverticula) form in the wall of your colon. Your colon, or large intestine, is where water is absorbed and stool is formed. The pouches form when the inside layer of your colon pushes through weak spots in the outer layers of your colon. CAUSES  No one knows exactly what causes diverticulosis. RISK FACTORS  Being older than 83. Your risk for this condition increases with age. Diverticulosis is rare in people younger than 40 years. By age 68, almost everyone has it.  Eating a low-fiber diet.  Being frequently constipated.  Being overweight.  Not getting enough exercise.  Smoking.  Taking over-the-counter pain medicines, like aspirin and ibuprofen. SYMPTOMS  Most  people with diverticulosis do not have symptoms. DIAGNOSIS  Because diverticulosis often has no symptoms, health care providers often discover the condition during an exam for other colon problems. In many cases, a health care provider will diagnose diverticulosis while using a flexible scope to examine the colon (colonoscopy). TREATMENT  If you have never developed an infection related to diverticulosis, you may not need treatment. If you have had an infection before, treatment may include:  Eating more fruits, vegetables, and grains.  Taking a fiber supplement.  Taking a live bacteria supplement (probiotic).  Taking medicine to relax your colon. HOME CARE INSTRUCTIONS   Drink at least 6-8 glasses  of water each day to prevent constipation.  Try not to strain when you have a bowel movement.  Keep all follow-up appointments. If you have had an infection before:  Increase the fiber in your diet as directed by your health care provider or dietitian.  Take a dietary fiber supplement if your health care provider approves.  Only take medicines as directed by your health care provider. SEEK MEDICAL CARE IF:   You have abdominal pain.  You have bloating.  You have cramps.  You have not gone to the bathroom in 3 days. SEEK IMMEDIATE MEDICAL CARE IF:   Your pain gets worse.  Yourbloating becomes very bad.  You have a fever or chills, and your symptoms suddenly get worse.  You begin vomiting.  You have bowel movements that are bloody or black. MAKE SURE YOU:  Understand these instructions.  Will watch your condition.  Will get help right away if you are not doing well or get worse.   This information is not intended to replace advice given to you by your health care provider. Make sure you discuss any questions you have with your health care provider.   Document Released: 02/21/2004 Document Revised: 05/31/2013 Document Reviewed: 04/20/2013 Elsevier Interactive  Patient Education 2016 Elsevier Inc.   High-Fiber Diet Fiber, also called dietary fiber, is a type of carbohydrate found in fruits, vegetables, whole grains, and beans. A high-fiber diet can have many health benefits. Your health care provider may recommend a high-fiber diet to help:  Prevent constipation. Fiber can make your bowel movements more regular.  Lower your cholesterol.  Relieve hemorrhoids, uncomplicated diverticulosis, or irritable bowel syndrome.  Prevent overeating as part of a weight-loss plan.  Prevent heart disease, type 2 diabetes, and certain cancers. WHAT IS MY PLAN? The recommended daily intake of fiber includes:  38 grams for men under age 12.  78 grams for men over age 72.  36 grams for women under age 21.  14 grams for women over age 42. You can get the recommended daily intake of dietary fiber by eating a variety of fruits, vegetables, grains, and beans. Your health care provider may also recommend a fiber supplement if it is not possible to get enough fiber through your diet. WHAT DO I NEED TO KNOW ABOUT A HIGH-FIBER DIET?  Fiber supplements have not been widely studied for their effectiveness, so it is better to get fiber through food sources.  Always check the fiber content on thenutrition facts label of any prepackaged food. Look for foods that contain at least 5 grams of fiber per serving.  Ask your dietitian if you have questions about specific foods that are related to your condition, especially if those foods are not listed in the following section.  Increase your daily fiber consumption gradually. Increasing your intake of dietary fiber too quickly may cause bloating, cramping, or gas.  Drink plenty of water. Water helps you to digest fiber. WHAT FOODS CAN I EAT? Grains Whole-grain breads. Multigrain cereal. Oats and oatmeal. Brown rice. Barley. Bulgur wheat. [place]. Bran muffins. Popcorn. Rye wafer crackers. Vegetables Sweet potatoes.  Spinach. Kale. Artichokes. Cabbage. Broccoli. Green peas. Carrots. Squash. Fruits Berries. Pears. Apples. Oranges. Avocados. Prunes and raisins. Dried figs. Meats and Other Protein Sources [place], kidney, pinto, and soy beans. Split peas. Lentils. Nuts and seeds. Dairy Fiber-fortified yogurt. Beverages Fiber-fortified soy milk. Fiber-fortified orange juice. Other Fiber bars. The items listed above may not be a complete list of recommended foods or beverages. Contact  your dietitian for more options. WHAT FOODS ARE NOT RECOMMENDED? Grains White bread. Pasta made with refined flour. White rice. Vegetables Fried potatoes. Canned vegetables. Well-cooked vegetables.  Fruits Fruit juice. Cooked, strained fruit. Meats and Other Protein Sources Fatty cuts of meat. Fried Sales executive or fried fish. Dairy Milk. Yogurt. Cream cheese. Sour cream. Beverages Soft drinks. Other Cakes and pastries. Butter and oils. The items listed above may not be a complete list of foods and beverages to avoid. Contact your dietitian for more information. WHAT ARE SOME TIPS FOR INCLUDING HIGH-FIBER FOODS IN MY DIET?  Eat a wide variety of high-fiber foods.  Make sure that half of all grains consumed each day are whole grains.  Replace breads and cereals made from refined flour or white flour with whole-grain breads and cereals.  Replace white rice with brown rice, bulgur wheat, or millet.  Start the day with a breakfast that is high in fiber, such as a cereal that contains at least 5 grams of fiber per serving.  Use beans in place of meat in soups, salads, or pasta.  Eat high-fiber snacks, such as berries, raw vegetables, nuts, or popcorn.   This information is not intended to replace advice given to you by your health care provider. Make sure you discuss any questions you have with your health care provider.   Document Released: 05/26/2005 Document Revised: 06/16/2014 Document Reviewed: 11/08/2013 Elsevier  Interactive Patient Education [employer].

## 2015-06-29 NOTE — Op Note (Addendum)
COLONOSCOPY PROCEDURE REPORT  PATIENT:  [name]  MR#:  [id number] Birthdate:  1941-04-12, 75 y.o., female Endoscopist:  Dr. Rogene Houston, MD  Procedure Date: 06/29/2015  Procedure:   Colonoscopy  Indications: Patient is 75 year old Caucasian female was history of colon carcinoma was lost colonoscopy was over 2 years ago(December 2014) presents with recurrent hematochezia. She has history of hemorrhoids and has been treated by Dr. Romona Curls. She was seen in the office recently and noted to have drop in hemoglobin by 1 g. She is undergoing diagnostic colonoscopy.  Informed Consent:  The procedure and risks were reviewed with the patient and informed consent was obtained.  Medications:  Demerol 50 mg IV Versed 4 mg IV  First dose administered at 1417 Last dose administered at 1420.  Scope Out 14:48  Description of procedure:  After a digital rectal exam was performed, that colonoscope was advanced from the anus through the rectum and colon to the area of about a flexor where ileocolonic anastomosis identified. Scope was advanced into distal small bowel and then slowly and cautiously withdrawn. The mucosal surfaces were carefully surveyed utilizing scope tip to flexion to facilitate fold flattening as needed. The scope was pulled down into the rectum where a thorough exam including retroflexion was performed.  Findings:   Prep satisfactory. Normal mucosa of distal small bowel. Wide-open ileocolonic anastomosis. Few scattered diverticula throughout the colon. Two small polyps are removed from sigmoid colon; one was cold snared and other one removed with cold biopsy. Small rectal polyp was cold snared. Hemorrhoids noted above and below the dentate line along with clot overlying one of the hemorrhoids but no active bleeding.    Therapeutic/Diagnostic Maneuvers Performed:  See above  Complications:  None  EBL:  Minimal  Cecal Withdrawal Time:  17 minutes  Impression:   Normal mucosa of distal small bowel. Wide-open ileocolonic anastomosis located in the region of hepatic flexure. Few scattered diverticula throughout the colon. Three small polyps were removed as above and submitted together. Two were located at sigmoid colon and the third polyp was at rectum. External and internal hemorrhoids with stigmata of recent bleed.   Recommendations:  Standard instructions given. Anusol HC suppository 1 per rectum daily at bedtime for 2 weeks. I will contact patient with biopsy results and further recommendations. Surgical consultation if bleeding does not subside. Next colonoscopy in 3 years.   REHMAN,NAJEEB U  06/29/2015 2:53 PM  CC: Dr. Robert Bellow, MD & Dr. Rayne Du ref. provider found

## 2015-06-29 NOTE — H&P (Signed)
[name] is an 75 y.o. female.   Chief Complaint: Patient is here for colonoscopy. HPI: Patient is 75 year old Caucasian female was history of colon carcinoma status post right hemicolectomy for 6 years ago who presents with recurrent hematochezia. Last colonoscopy was 2 years ago. She has history of hemorrhoids and was treated by Dr. Romona Curls. She was seen in the office recently and noted to have drop in hemoglobin by 1 g. She denies constipation and has occasional diarrhea.  Past Medical History  Diagnosis Date  . Nausea   . Bloating   . Dysphagia   . Gastroesophageal reflux   . Gastroparesis   . Anemia   . TIA (transient ischemic attack)     "3-4 before starting RX; none since" (04/19/2013)  . PONV (postoperative nausea and vomiting)   . Fibromyalgia   . Renal insufficiency   . Chronic neck pain   . Dizziness   . Chronic heartburn   . Chronic pain   . Anxiety   . Family history of anesthesia complication     "daughter has PONV too" (04/19/2013)  . Hypertension     "for diabetes; don't have high blood pressure" (04/19/2013)  . Type II diabetes mellitus ([place])   . Bleeding stomach ulcer 1980's  . History of blood transfusion 1980's    "w/bleeding stomach ulcer" (04/19/2013)  . Hiatal hernia     "had it before; had OR; got it again" (04/19/2013)  . Chronic headaches   . Migraine     "take RX for it qd" (04/19/2013)  . DDD (degenerative disc disease), lumbar   . Arthritis     "all over" (04/19/2013)  . Chronic back pain   . Colon cancer ([place])   . Diabetic peripheral neuropathy ([place])     "in my feet" (04/19/2013)  . Walking pneumonia ~ 1966  . Pneumonia 04/2012  . Glaucoma, bilateral     Past Surgical History  Procedure Laterality Date  . Hemicolectomy  2010    ZIEGLER  . Colonoscopy  10/11/2010  . Colonoscopy  11/23/2009  . Colonoscopy  07/28/2008    W/SNARE  . Colonoscopy  06/28/07  . Colonoscopy  05/10/07    W/POLYP  . Colonoscopy  12/28/00  . Upper  gastrointestinal endoscopy  10/11/2010    EGD ED  . Upper gastrointestinal endoscopy  11/23/2009  . Upper gastrointestinal endoscopy  05/10/07    EGD ED  . Upper gastrointestinal endoscopy  08/11/01    EGD ED  . Posterior fusion cervical spine  1985    "had a broken neck" (04/19/2013)  . Hiatal hernia repair  1970's  . Tonsillectomy  ~ 1953  . Colon surgery    . Cholecystectomy  1980's  . Anterior cervical discectomy  ~ 2009    "only cleaned out arthritis and spurs" (04/19/2013)  . Vaginal hysterectomy    . Cataract extraction w/ intraocular lens implant Left 04/13/2013  . Abdominal adhesion surgery  ~ 2012    "repaired wrap where they did hiatal hernia OR too" 911/04/2013)  . Colonoscopy with esophagogastroduodenoscopy (egd) N/A 05/13/2013    Procedure: COLONOSCOPY WITH ESOPHAGOGASTRODUODENOSCOPY (EGD);  Surgeon: Rogene Houston, MD;  [facility];  Service: Endoscopy;  Laterality: N/A;  855  . Left heart catheterization with coronary angiogram N/A 04/19/2013    Procedure: LEFT HEART CATHETERIZATION WITH CORONARY ANGIOGRAM;  Surgeon: Peter M Martinique, MD;  Location: [facility] CATH LAB;  Service: Cardiovascular;  Laterality: N/A;    Family History  Problem  Relation Age of Onset  . Heart disease Mother   . Diabetes Mother   . Dementia Father   . Healthy Sister   . Diabetes Brother   . Kidney cancer Brother   . Diabetes Brother   . Neuropathy Brother   . Diabetes Daughter   . Diabetes Daughter   . Diabetes Son    Social History:  reports that she quit smoking about 24 years ago. Her smoking use included Cigarettes. She has a 22.5 pack-year smoking history. She has never used smokeless tobacco. She reports that she does not drink alcohol or use illicit drugs.  Allergies:  Allergies  Allergen Reactions  . Morphine Anaphylaxis and Other (See Comments)    "it will kill me" "made me stop breathing"  . Aspirin Other (See Comments)    Gi bleed   . Cefuroxime Axetil Nausea And  Vomiting  . Codeine Nausea And Vomiting  . Diltiazem Nausea And Vomiting  . Dronabinol Nausea And Vomiting and Palpitations    Patient also states that she had palpatations  . Shellfish Allergy Other (See Comments)    Blood sugar drops  . Ondansetron Hives  . Penicillins Rash    Medications Prior to Admission  Medication Sig Dispense Refill  . Calcium Carbonate-Vitamin D (CALCIUM + D PO) Take 1 tablet by mouth daily. TAKING 1,000 CALCIUM AND 2,000 VIT D    . Cyanocobalamin (VITAMIN B 12 PO) Take 2,000 mcg by mouth daily.    Marland Kitchen dexlansoprazole (DEXILANT) 60 MG capsule Take 1 capsule (60 mg total) by mouth daily before breakfast. 90 capsule 3  . docusate sodium (COLACE) 100 MG capsule Take 100 mg by mouth daily.     . DULoxetine (CYMBALTA) 60 MG capsule Take 60 mg by mouth daily.    . famotidine (PEPCID) 20 MG tablet Take 2 tablets (40 mg total) by mouth at bedtime. 180 tablet 3  . fentaNYL (DURAGESIC - DOSED MCG/HR) 50 MCG/HR Place 50 mcg onto the skin every 3 (three) days.    . metFORMIN (GLUMETZA) 500 MG (MOD) 24 hr tablet Take 500 mg by mouth daily with breakfast.    . metoCLOPramide (REGLAN) 10 MG tablet Take 1 tablet (10 mg total) by mouth 3 (three) times daily before meals. 270 tablet 1  . MISC NATURAL PRODUCTS ER PO Take 1-3 capsules by mouth 2 (two) times daily. Digestive Blend Softgels    . Multiple Vitamins-Minerals (MULTIVITAMINS THER. W/MINERALS) TABS tablet Take 1 tablet by mouth daily.    Marland Kitchen oxyCODONE-acetaminophen (PERCOCET/ROXICET) 5-325 MG per tablet Take 1 tablet by mouth every 4 (four) hours as needed for moderate pain.    . polyethylene glycol (MIRALAX / GLYCOLAX) packet Take 17 g by mouth daily as needed for mild constipation.     . ranolazine (RANEXA) 500 MG 12 hr tablet Take 1 tablet (500 mg total) by mouth 2 (two) times daily. 180 tablet 3  . tamsulosin (FLOMAX) 0.4 MG CAPS capsule Take 0.4 mg by mouth every 12 (twelve) hours.    . topiramate (TOPAMAX) 100 MG tablet  Take 100 mg by mouth at bedtime.    . Travoprost, BAK Free, (TRAVATAN) 0.004 % SOLN ophthalmic solution Place 1 drop into both eyes at bedtime.    Marland Kitchen acetaminophen (TYLENOL) 500 MG tablet Take 500 mg by mouth every 6 (six) hours as needed for mild pain.     Marland Kitchen ALPRAZolam (XANAX XR) 1 MG 24 hr tablet Take 0.5-1 mg by mouth as needed for sleep (Anxiety).     Marland Kitchen  mometasone (NASONEX) 50 MCG/ACT nasal spray Place 2 sprays into the nose daily as needed (Allergies).       Results for orders placed or performed during the hospital encounter of 06/29/15 (from the past 48 hour(s))  Glucose, capillary     Status: Abnormal   Collection Time: 06/29/15  1:41 PM  Result Value Ref Range   Glucose-Capillary 138 (H) 65 - 99 mg/dL   No results found.  ROS  Blood pressure 145/95, pulse 52, temperature 98.8 F (37.1 C), temperature source Oral, resp. rate 12, height 4\' 11"  (1.499 m), weight 129 lb (58.514 kg), SpO2 97 %. Physical Exam  Constitutional: She appears well-developed and well-nourished.  HENT:  Mouth/Throat: Oropharynx is clear and moist.  Eyes: Conjunctivae are normal. No scleral icterus.  Neck: No thyromegaly present.  Cardiovascular: Normal rate, regular rhythm and normal heart sounds.   No murmur heard. Respiratory: Effort normal and breath sounds normal.  GI:  Soft abdomen with mild midepigastric tenderness.  Musculoskeletal: She exhibits no edema.  Lymphadenopathy:    She has no cervical adenopathy.  Neurological: She is alert.  Skin: Skin is warm and dry.     Assessment/Plan Rectal bleeding and anemia. History of colon carcinoma. Diagnostic colonoscopy.  Burnard Enis U 06/29/2015, 2:11 PM

## 2015-07-03 ENCOUNTER — Encounter (HOSPITAL_COMMUNITY): Payer: Self-pay | Admitting: Internal Medicine

## 2015-09-11 ENCOUNTER — Other Ambulatory Visit: Payer: Self-pay | Admitting: Dermatology

## 2015-09-20 ENCOUNTER — Ambulatory Visit (INDEPENDENT_AMBULATORY_CARE_PROVIDER_SITE_OTHER): Payer: Medicare Other | Admitting: Otolaryngology

## 2015-09-20 DIAGNOSIS — H9202 Otalgia, left ear: Secondary | ICD-10-CM

## 2015-09-20 DIAGNOSIS — H6123 Impacted cerumen, bilateral: Secondary | ICD-10-CM | POA: Diagnosis not present

## 2015-09-20 DIAGNOSIS — H7201 Central perforation of tympanic membrane, right ear: Secondary | ICD-10-CM

## 2015-09-20 DIAGNOSIS — H903 Sensorineural hearing loss, bilateral: Secondary | ICD-10-CM | POA: Diagnosis not present

## 2015-10-04 ENCOUNTER — Other Ambulatory Visit: Payer: Self-pay | Admitting: *Deleted

## 2015-10-04 ENCOUNTER — Telehealth: Payer: Self-pay | Admitting: Cardiovascular Disease

## 2015-10-04 MED ORDER — RANOLAZINE ER 500 MG PO TB12: 500.0000 mg | ORAL_TABLET | Freq: Two times a day (BID) | ORAL | Status: DC

## 2015-10-04 NOTE — Telephone Encounter (Signed)
Patient canceled her refill at [facility].  She needs her Ranexa refill called into the [place] instead. Please call her once this has been called in.

## 2015-10-31 ENCOUNTER — Ambulatory Visit (INDEPENDENT_AMBULATORY_CARE_PROVIDER_SITE_OTHER): Payer: Medicare Other | Admitting: Cardiovascular Disease

## 2015-10-31 ENCOUNTER — Encounter: Payer: Self-pay | Admitting: Cardiovascular Disease

## 2015-10-31 VITALS — BP 150/76 | HR 61 | Ht 59.0 in | Wt 131.0 lb

## 2015-10-31 DIAGNOSIS — R001 Bradycardia, unspecified: Secondary | ICD-10-CM

## 2015-10-31 DIAGNOSIS — R079 Chest pain, unspecified: Secondary | ICD-10-CM

## 2015-10-31 DIAGNOSIS — I25118 Atherosclerotic heart disease of native coronary artery with other forms of angina pectoris: Secondary | ICD-10-CM

## 2015-10-31 MED ORDER — RANOLAZINE ER 1000 MG PO TB12
1000.0000 mg | ORAL_TABLET | Freq: Two times a day (BID) | ORAL | Status: DC
Start: 2015-10-31 — End: 2016-11-10

## 2015-10-31 NOTE — Patient Instructions (Signed)
Medication Instructions:  INCREASE RANEXA TO 1000 MG TWO TIMES DAILY   Labwork: NONE  Testing/Procedures: NONE  Follow-Up: Your physician wants you to follow-up in: 1 YEAR .  You will receive a reminder letter in the mail two months in advance. If you don't receive a letter, please call our office to schedule the follow-up appointment.   Any Other Special Instructions Will Be Listed Below (If Applicable).     If you need a refill on your cardiac medications before your next appointment, please call your pharmacy.

## 2015-10-31 NOTE — Progress Notes (Signed)
Patient ID: [name], female   DOB: [birth year], 75 y.o.   MRN: [id number]      SUBJECTIVE: The patient is here to followup for a chest pain syndrome and a history of asymptomatic sinus bradycardia. Coronary angiography 04/19/13 showed 30% mid vessel and 40% distal LAD stenosis, 30-40% distal OM1 lesion, 20% proximal, 30% at the crux, and 40-50% distal RCA stenosis.  She has severe GERD. She has lower retrosternal chest pains which she describes as sharp and lasting approximately 5 minutes. She has received Botox injections and they initially helped but they have not done so recently. These pains can occur both with and without exertion.  ECG performed in the office today which I personally interpreted demonstrated sinus rhythm with diffuse nonspecific ST segment and T-wave abnormalities.   Review of Systems: As per "subjective", otherwise negative.  Allergies  Allergen Reactions  . Morphine Anaphylaxis and Other (See Comments)    "it will kill me" "made me stop breathing"  . Aspirin Other (See Comments)    Gi bleed   . Cefuroxime Axetil Nausea And Vomiting  . Codeine Nausea And Vomiting  . Diltiazem Nausea And Vomiting  . Dronabinol Nausea And Vomiting and Palpitations    Patient also states that she had palpatations  . Shellfish Allergy Other (See Comments)    Blood sugar drops  . Ondansetron Hives  . Penicillins Rash    Current Outpatient Prescriptions  Medication Sig Dispense Refill  . acetaminophen (TYLENOL) 500 MG tablet Take 500 mg by mouth every 6 (six) hours as needed for mild pain.     Marland Kitchen ALPRAZolam (XANAX XR) 1 MG 24 hr tablet Take 0.5-1 mg by mouth as needed for sleep (Anxiety).     . Calcium Carbonate-Vitamin D (CALCIUM + D PO) Take 1 tablet by mouth daily. TAKING 1,000 CALCIUM AND 2,000 VIT D    . Cyanocobalamin (VITAMIN B 12 PO) Take 2,000 mcg by mouth daily.    Marland Kitchen dexlansoprazole (DEXILANT) 60 MG capsule Take 1 capsule (60 mg total) by mouth daily before  breakfast. 90 capsule 3  . docusate sodium (COLACE) 100 MG capsule Take 100 mg by mouth daily.     . DULoxetine (CYMBALTA) 60 MG capsule Take 60 mg by mouth daily.    . famotidine (PEPCID) 20 MG tablet Take 2 tablets (40 mg total) by mouth at bedtime. 180 tablet 3  . fentaNYL (DURAGESIC - DOSED MCG/HR) 50 MCG/HR Place 50 mcg onto the skin every 3 (three) days.    . hydrocortisone (ANUSOL-HC) 25 MG suppository Place 1 suppository (25 mg total) rectally at bedtime. 14 suppository 0  . metFORMIN (GLUMETZA) 500 MG (MOD) 24 hr tablet Take 500 mg by mouth daily with breakfast.    . metoCLOPramide (REGLAN) 10 MG tablet Take 1 tablet (10 mg total) by mouth 3 (three) times daily before meals. 270 tablet 1  . MISC NATURAL PRODUCTS ER PO Take 1-3 capsules by mouth 2 (two) times daily. Digestive Blend Softgels    . mometasone (NASONEX) 50 MCG/ACT nasal spray Place 2 sprays into the nose daily as needed (Allergies).     . Multiple Vitamins-Minerals (MULTIVITAMINS THER. W/MINERALS) TABS tablet Take 1 tablet by mouth daily.    Marland Kitchen oxyCODONE-acetaminophen (PERCOCET/ROXICET) 5-325 MG per tablet Take 1 tablet by mouth every 4 (four) hours as needed for moderate pain.    . polyethylene glycol (MIRALAX / GLYCOLAX) packet Take 17 g by mouth daily as needed for mild constipation.     Marland Kitchen  ranolazine (RANEXA) 500 MG 12 hr tablet Take 1 tablet (500 mg total) by mouth 2 (two) times daily. 60 tablet 0  . tamsulosin (FLOMAX) 0.4 MG CAPS capsule Take 0.4 mg by mouth every 12 (twelve) hours.    . topiramate (TOPAMAX) 100 MG tablet Take 100 mg by mouth at bedtime.    . Travoprost, BAK Free, (TRAVATAN) 0.004 % SOLN ophthalmic solution Place 1 drop into both eyes at bedtime.     No current facility-administered medications for this visit.    Past Medical History  Diagnosis Date  . Nausea   . Bloating   . Dysphagia   . Gastroesophageal reflux   . Gastroparesis   . Anemia   . TIA (transient ischemic attack)     "3-4 before  starting RX; none since" (04/19/2013)  . PONV (postoperative nausea and vomiting)   . Fibromyalgia   . Renal insufficiency   . Chronic neck pain   . Dizziness   . Chronic heartburn   . Chronic pain   . Anxiety   . Family history of anesthesia complication     "daughter has PONV too" (04/19/2013)  . Hypertension     "for diabetes; don't have high blood pressure" (04/19/2013)  . Type II diabetes mellitus ([place])   . Bleeding stomach ulcer 1980's  . History of blood transfusion 1980's    "w/bleeding stomach ulcer" (04/19/2013)  . Hiatal hernia     "had it before; had OR; got it again" (04/19/2013)  . Chronic headaches   . Migraine     "take RX for it qd" (04/19/2013)  . DDD (degenerative disc disease), lumbar   . Arthritis     "all over" (04/19/2013)  . Chronic back pain   . Colon cancer ([place])   . Diabetic peripheral neuropathy ([place])     "in my feet" (04/19/2013)  . Walking pneumonia ~ 1966  . Pneumonia 04/2012  . Glaucoma, bilateral     Past Surgical History  Procedure Laterality Date  . Hemicolectomy  2010    ZIEGLER  . Colonoscopy  10/11/2010  . Colonoscopy  11/23/2009  . Colonoscopy  07/28/2008    W/SNARE  . Colonoscopy  06/28/07  . Colonoscopy  05/10/07    W/POLYP  . Colonoscopy  12/28/00  . Upper gastrointestinal endoscopy  10/11/2010    EGD ED  . Upper gastrointestinal endoscopy  11/23/2009  . Upper gastrointestinal endoscopy  05/10/07    EGD ED  . Upper gastrointestinal endoscopy  08/11/01    EGD ED  . Posterior fusion cervical spine  1985    "had a broken neck" (04/19/2013)  . Hiatal hernia repair  1970's  . Tonsillectomy  ~ 1953  . Colon surgery    . Cholecystectomy  1980's  . Anterior cervical discectomy  ~ 2009    "only cleaned out arthritis and spurs" (04/19/2013)  . Vaginal hysterectomy    . Cataract extraction w/ intraocular lens implant Left 04/13/2013  . Abdominal adhesion surgery  ~ 2012    "repaired wrap where they did hiatal hernia OR too"  911/04/2013)  . Colonoscopy with esophagogastroduodenoscopy (egd) N/A 05/13/2013    Procedure: COLONOSCOPY WITH ESOPHAGOGASTRODUODENOSCOPY (EGD);  Surgeon: Rogene Houston, MD;  [facility]: [facility];  Service: Endoscopy;  Laterality: N/A;  855  . Left heart catheterization with coronary angiogram N/A 04/19/2013    Procedure: LEFT HEART CATHETERIZATION WITH CORONARY ANGIOGRAM;  Surgeon: Peter M Martinique, MD;  Location: [facility] CATH LAB;  Service: Cardiovascular;  Laterality:  N/A;  . Colonoscopy N/A 06/29/2015    Procedure: COLONOSCOPY;  Surgeon: Rogene Houston, MD;  Location: [facility];  Service: Endoscopy;  Laterality: N/A;  135    Social History   Social History  . Marital Status: Widowed    Spouse Name: N/A  . Number of Children: N/A  . Years of Education: N/A   Occupational History  .     Social History Main Topics  . Smoking status: Former Smoker -- 1.50 packs/day for 15 years    Types: Cigarettes    Quit date: 03/17/1991  . Smokeless tobacco: Never Used     Comment: Patient states that it has ben greater than 20 years since she quit smoking  . Alcohol Use: No  . Drug Use: No  . Sexual Activity: No   Other Topics Concern  . Not on file   Social History Narrative   Patient lives at home alone.    Patient is retired.    Patient is widowed.    Patient has 4 children.   Patient has a 11th grade education.            Filed Vitals:   10/31/15 0942  BP: 150/76  Pulse: 61  Height: 4\' 11"  (1.499 m)  Weight: 131 lb (59.421 kg)  SpO2: 97%    PHYSICAL EXAM General: NAD HEENT: Normal. Neck: No JVD, no thyromegaly. Lungs: Clear to auscultation bilaterally with normal respiratory effort. CV: Nondisplaced PMI.  Regular rate and rhythm, normal S1/S2, no S3/S4, no murmur. No pretibial or periankle edema.  No carotid bruit.   Abdomen: Soft, nontender, no distention.  Neurologic: Alert and oriented.  Psych: Normal affect. Skin: Normal. Musculoskeletal: No gross  deformities.    ECG: Most recent ECG reviewed.      ASSESSMENT AND PLAN: 1. 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 Has nonobstructive CAD as noted above. Will increase Ranexa to 1000 mg bid.  2. Bradycardia: HR normal at 61 bpm today. Prior Holter monitoring showed normal sinus rhythm with very few PACs and sinus bradycardia with an average heart rate of 50 beats per minute. There were no long pauses detected. Given her known h/o severe gastroparesis, she may very well have an autonomic neuropathy.   3. CAD: Nonobstructive. Will increase Ranexa to help attenuate pain.  Dispo: fu 1 year.  Kate Sable, M.D., F.A.C.C.

## 2015-11-01 NOTE — Addendum Note (Signed)
Addended by: Levonne Hubert on: 11/01/2015 01:04 PM   Modules accepted: Orders

## 2015-12-25 ENCOUNTER — Encounter (INDEPENDENT_AMBULATORY_CARE_PROVIDER_SITE_OTHER): Payer: Self-pay | Admitting: Internal Medicine

## 2015-12-25 ENCOUNTER — Ambulatory Visit (INDEPENDENT_AMBULATORY_CARE_PROVIDER_SITE_OTHER): Payer: Medicare Other | Admitting: Internal Medicine

## 2015-12-25 VITALS — BP 110/72 | HR 74 | Temp 98.3°F | Resp 18 | Ht 61.0 in | Wt 130.8 lb

## 2015-12-25 DIAGNOSIS — K3184 Gastroparesis: Secondary | ICD-10-CM

## 2015-12-25 DIAGNOSIS — E1143 Type 2 diabetes mellitus with diabetic autonomic (poly)neuropathy: Secondary | ICD-10-CM | POA: Diagnosis not present

## 2015-12-25 DIAGNOSIS — Z862 Personal history of diseases of the blood and blood-forming organs and certain disorders involving the immune mechanism: Secondary | ICD-10-CM

## 2015-12-25 DIAGNOSIS — K219 Gastro-esophageal reflux disease without esophagitis: Secondary | ICD-10-CM

## 2015-12-25 DIAGNOSIS — R1013 Epigastric pain: Secondary | ICD-10-CM | POA: Diagnosis not present

## 2015-12-25 MED ORDER — METOCLOPRAMIDE HCL 10 MG PO TABS: 10.0000 mg | ORAL_TABLET | Freq: Three times a day (TID) | ORAL | Status: DC

## 2015-12-25 NOTE — Progress Notes (Signed)
Presenting complaint;  Follow-up for GERD gastroparesis at least an iron deficiency anemia.  Subjective:  [name] is 75 year old Caucasian female who is here for scheduled visit accompanied by her daughter [name]. She was last seen 6 months ago. She did see Dr[facility]Koch at [facility] about 4 months ago for Botox therapy. She says it did not help at all. Prior therapies provided symptomatic relief for several months. She is having daily epigastric pain. She has daily nausea with heaving and vomiting which occurs when she brushes her teeth in the morning. She vomits food and fluid but no blood. She is not having any side effects with metoclopramide. She needs a new prescription which she gets from [place]. Daughter has not noted any tremors or involuntary movement were left. She has gait problems felt to be due to her back and neuropathy. She does not take pain medication every day. She also complains of constipation. She has never junk 2 bowel movements per week. She feels this causes flareup with the hemorrhoids and she notices blood on the tissue at least once a week. She denies melena fever or chills.  Current Medications: Outpatient Encounter Prescriptions as of 12/25/2015  Medication Sig  . acetaminophen (TYLENOL) 500 MG tablet Take 500 mg by mouth every 6 (six) hours as needed for mild pain.   Marland Kitchen ALPRAZolam (XANAX XR) 1 MG 24 hr tablet Take 0.5-1 mg by mouth as needed for sleep (Anxiety).   . Calcium Carbonate-Vitamin D (CALCIUM + D PO) Take 1 tablet by mouth daily. TAKING 1,000 CALCIUM AND 2,000 VIT D  . Cyanocobalamin (VITAMIN B 12 PO) Take 2,000 mcg by mouth daily.  Marland Kitchen dexlansoprazole (DEXILANT) 60 MG capsule Take 1 capsule (60 mg total) by mouth daily before breakfast.  . docusate sodium (COLACE) 100 MG capsule Take 100 mg by mouth daily.   . DULoxetine (CYMBALTA) 60 MG capsule Take 60 mg by mouth daily.  . famotidine (PEPCID) 20 MG tablet Take 2 tablets (40 mg total) by mouth at bedtime.  . fentaNYL (DURAGESIC  - DOSED MCG/HR) 50 MCG/HR Place 50 mcg onto the skin every 3 (three) days.  . hydrocortisone (ANUSOL-HC) 25 MG suppository Place 1 suppository (25 mg total) rectally at bedtime.  . metFORMIN (GLUMETZA) 500 MG (MOD) 24 hr tablet Take 500 mg by mouth daily with breakfast.  . metoCLOPramide (REGLAN) 10 MG tablet Take 1 tablet (10 mg total) by mouth 3 (three) times daily before meals.  Marland Kitchen MISC NATURAL PRODUCTS ER PO Take 1-3 capsules by mouth 2 (two) times daily. Digestive Blend Softgels  . mometasone (NASONEX) 50 MCG/ACT nasal spray Place 2 sprays into the nose daily as needed (Allergies).   . Multiple Vitamins-Minerals (MULTIVITAMINS THER. W/MINERALS) TABS tablet Take 1 tablet by mouth daily.  Marland Kitchen oxyCODONE-acetaminophen (PERCOCET/ROXICET) 5-325 MG per tablet Take 1 tablet by mouth every 4 (four) hours as needed for moderate pain.  . polyethylene glycol (MIRALAX / GLYCOLAX) packet Take 17 g by mouth daily as needed for mild constipation.   . ranolazine (RANEXA) 1000 MG SR tablet Take 1 tablet (1,000 mg total) by mouth 2 (two) times daily.  . tamsulosin (FLOMAX) 0.4 MG CAPS capsule Take 0.4 mg by mouth every 12 (twelve) hours.  . topiramate (TOPAMAX) 100 MG tablet Take 100 mg by mouth at bedtime.  . Travoprost, BAK Free, (TRAVATAN) 0.004 % SOLN ophthalmic solution Place 1 drop into both eyes at bedtime.   No facility-administered encounter medications on file as of 12/25/2015.     Objective:  Blood pressure 110/72, pulse 74, temperature 98.3 F (36.8 C), temperature source Oral, resp. rate 18, height 5\' 1"  (1.549 m), weight 130 lb 12.8 oz (59.33 kg). Patient is alert and in no acute distress. She does not have tremors or involuntary movements to tongue or lip. Conjunctiva is pink. Sclera is nonicteric Oropharyngeal mucosa is normal. No neck masses or thyromegaly noted. Cardiac exam with regular rhythm normal S1 and S2. No murmur or gallop noted. Lungs are clear to auscultation. Abdomen is  symmetrical and soft. She has mild midepigastric tenderness. No organomegaly or masses.  No LE edema or clubbing noted.  Labs/studies Results: CBC from 06/26/2015  CBC 7.4, H&H 10.9 and 34.6 and platelet count 222K.    Assessment:  #1. Diabetic gastroparesis. She is not doing well. She had Botox injection to pyloric channel about 4 months ago but did not experience any benefit. We will check with Dr. Derrill Kay if therapy could be repeated or if she should consider other options. Medications may be further delaying gastric emptying. #2. Epigastric pain possibly secondary to above #3. GERD. Symptoms are not well controlled secondary to underlying gastroparesis. #4. History of iron deficiency anemia. IDA is most likely secondary to impaired iron absorption due to chronic PPI therapy. She has history of colon carcinoma and underwent diagnostic/surveillance colonoscopy in January this year revealing 3 small polyps and internal and external hemorrhoids felt to be source of recent hematochezia.   Plan:  New prescription given for metoclopramide 10 mg before meals. 3 month supply with 1 refill. She will get it filled at [place]. CBC, comprehensive chemistry panel, TSH, serum iron TIBC and ferritin. Will ask lab to freeze serum for 1 week in case further studies needed.. Will confer with Dr. Derrill Kay if she would benefit from repeat Botox therapy. Office visit in 6 months.

## 2015-12-25 NOTE — Patient Instructions (Signed)
Physician will call with results of blood tests when completed. 

## 2015-12-26 LAB — CBC
HCT: 35.6 % (ref 35.0–45.0)
Hemoglobin: 11.4 g/dL — ABNORMAL LOW (ref 11.7–15.5)
MCH: 29.6 pg (ref 27.0–33.0)
MCHC: 32 g/dL (ref 32.0–36.0)
MCV: 92.5 fL (ref 80.0–100.0)
MPV: 12.8 fL — ABNORMAL HIGH (ref 7.5–12.5)
Platelets: 194 10*3/uL (ref 140–400)
RBC: 3.85 MIL/uL (ref 3.80–5.10)
RDW: 13.8 % (ref 11.0–15.0)
WBC: 5.8 10*3/uL (ref 3.8–10.8)

## 2015-12-26 LAB — IRON AND TIBC
%SAT: 34 % (ref 11–50)
Iron: 93 ug/dL (ref 45–160)
TIBC: 275 ug/dL (ref 250–450)
UIBC: 182 ug/dL (ref 125–400)

## 2015-12-26 LAB — COMPREHENSIVE METABOLIC PANEL
ALT: 9 U/L (ref 6–29)
AST: 9 U/L — ABNORMAL LOW (ref 10–35)
Albumin: 3.7 g/dL (ref 3.6–5.1)
Alkaline Phosphatase: 38 U/L (ref 33–130)
BUN: 10 mg/dL (ref 7–25)
CO2: 25 mmol/L (ref 20–31)
Calcium: 9.5 mg/dL (ref 8.6–10.4)
Chloride: 104 mmol/L (ref 98–110)
Creat: 0.91 mg/dL (ref 0.60–0.93)
Glucose, Bld: 164 mg/dL — ABNORMAL HIGH (ref 65–99)
Potassium: 3.8 mmol/L (ref 3.5–5.3)
Sodium: 142 mmol/L (ref 135–146)
Total Bilirubin: 0.4 mg/dL (ref 0.2–1.2)
Total Protein: 6.3 g/dL (ref 6.1–8.1)

## 2015-12-26 LAB — FERRITIN: Ferritin: 48 ng/mL (ref 20–288)

## 2015-12-26 LAB — TSH: TSH: 1.31 mIU/L

## 2016-02-02 ENCOUNTER — Emergency Department (HOSPITAL_COMMUNITY): Payer: Medicare Other

## 2016-02-02 ENCOUNTER — Encounter (HOSPITAL_COMMUNITY): Payer: Self-pay | Admitting: Emergency Medicine

## 2016-02-02 ENCOUNTER — Observation Stay (HOSPITAL_COMMUNITY)
Admission: EM | Admit: 2016-02-02 | Discharge: 2016-02-04 | Disposition: A | Discharge status: Home Health | Payer: Medicare Other | Attending: Neurosurgery | Admitting: Neurosurgery

## 2016-02-02 DIAGNOSIS — F419 Anxiety disorder, unspecified: Secondary | ICD-10-CM | POA: Insufficient documentation

## 2016-02-02 DIAGNOSIS — S060X0A Concussion without loss of consciousness, initial encounter: Secondary | ICD-10-CM | POA: Insufficient documentation

## 2016-02-02 DIAGNOSIS — Z85038 Personal history of other malignant neoplasm of large intestine: Secondary | ICD-10-CM | POA: Insufficient documentation

## 2016-02-02 DIAGNOSIS — E1143 Type 2 diabetes mellitus with diabetic autonomic (poly)neuropathy: Secondary | ICD-10-CM | POA: Insufficient documentation

## 2016-02-02 DIAGNOSIS — Z981 Arthrodesis status: Secondary | ICD-10-CM | POA: Insufficient documentation

## 2016-02-02 DIAGNOSIS — Y9301 Activity, walking, marching and hiking: Secondary | ICD-10-CM | POA: Insufficient documentation

## 2016-02-02 DIAGNOSIS — Z7984 Long term (current) use of oral hypoglycemic drugs: Secondary | ICD-10-CM | POA: Insufficient documentation

## 2016-02-02 DIAGNOSIS — G43909 Migraine, unspecified, not intractable, without status migrainosus: Secondary | ICD-10-CM | POA: Diagnosis not present

## 2016-02-02 DIAGNOSIS — M549 Dorsalgia, unspecified: Secondary | ICD-10-CM | POA: Insufficient documentation

## 2016-02-02 DIAGNOSIS — I10 Essential (primary) hypertension: Secondary | ICD-10-CM | POA: Insufficient documentation

## 2016-02-02 DIAGNOSIS — Z87891 Personal history of nicotine dependence: Secondary | ICD-10-CM | POA: Diagnosis not present

## 2016-02-02 DIAGNOSIS — K219 Gastro-esophageal reflux disease without esophagitis: Secondary | ICD-10-CM | POA: Insufficient documentation

## 2016-02-02 DIAGNOSIS — Z79899 Other long term (current) drug therapy: Secondary | ICD-10-CM | POA: Diagnosis not present

## 2016-02-02 DIAGNOSIS — M797 Fibromyalgia: Secondary | ICD-10-CM | POA: Insufficient documentation

## 2016-02-02 DIAGNOSIS — Z8673 Personal history of transient ischemic attack (TIA), and cerebral infarction without residual deficits: Secondary | ICD-10-CM | POA: Insufficient documentation

## 2016-02-02 DIAGNOSIS — H409 Unspecified glaucoma: Secondary | ICD-10-CM | POA: Diagnosis not present

## 2016-02-02 DIAGNOSIS — K3184 Gastroparesis: Secondary | ICD-10-CM | POA: Diagnosis not present

## 2016-02-02 DIAGNOSIS — S0291XA Unspecified fracture of skull, initial encounter for closed fracture: Secondary | ICD-10-CM | POA: Diagnosis present

## 2016-02-02 DIAGNOSIS — S0219XA Other fracture of base of skull, initial encounter for closed fracture: Secondary | ICD-10-CM

## 2016-02-02 DIAGNOSIS — Y92481 Parking lot as the place of occurrence of the external cause: Secondary | ICD-10-CM | POA: Diagnosis not present

## 2016-02-02 DIAGNOSIS — G8929 Other chronic pain: Secondary | ICD-10-CM | POA: Diagnosis not present

## 2016-02-02 DIAGNOSIS — E1142 Type 2 diabetes mellitus with diabetic polyneuropathy: Secondary | ICD-10-CM | POA: Insufficient documentation

## 2016-02-02 DIAGNOSIS — S02119A Unspecified fracture of occiput, initial encounter for closed fracture: Secondary | ICD-10-CM | POA: Diagnosis not present

## 2016-02-02 DIAGNOSIS — S0101XA Laceration without foreign body of scalp, initial encounter: Secondary | ICD-10-CM | POA: Insufficient documentation

## 2016-02-02 LAB — CBC WITH DIFFERENTIAL/PLATELET
Basophils Absolute: 0 10*3/uL (ref 0.0–0.1)
Basophils Relative: 0 %
Eosinophils Absolute: 0 10*3/uL (ref 0.0–0.7)
Eosinophils Relative: 0 %
HCT: 37 % (ref 36.0–46.0)
Hemoglobin: 11.9 g/dL — ABNORMAL LOW (ref 12.0–15.0)
Lymphocytes Relative: 9 %
Lymphs Abs: 1.1 10*3/uL (ref 0.7–4.0)
MCH: 30.4 pg (ref 26.0–34.0)
MCHC: 32.2 g/dL (ref 30.0–36.0)
MCV: 94.6 fL (ref 78.0–100.0)
Monocytes Absolute: 0.4 10*3/uL (ref 0.1–1.0)
Monocytes Relative: 3 %
Neutro Abs: 10.7 10*3/uL — ABNORMAL HIGH (ref 1.7–7.7)
Neutrophils Relative %: 88 %
Platelets: 185 10*3/uL (ref 150–400)
RBC: 3.91 MIL/uL (ref 3.87–5.11)
RDW: 13.7 % (ref 11.5–15.5)
WBC: 12.2 10*3/uL — ABNORMAL HIGH (ref 4.0–10.5)

## 2016-02-02 LAB — BASIC METABOLIC PANEL
Anion gap: 9 (ref 5–15)
BUN: 8 mg/dL (ref 6–20)
CO2: 26 mmol/L (ref 22–32)
Calcium: 9.6 mg/dL (ref 8.9–10.3)
Chloride: 105 mmol/L (ref 101–111)
Creatinine, Ser: 0.85 mg/dL (ref 0.44–1.00)
GFR calc Af Amer: >60 mL/min (ref >60)
GFR calc non Af Amer: >60 mL/min (ref >60)
Glucose, Bld: 211 mg/dL — ABNORMAL HIGH (ref 65–99)
Potassium: 3.1 mmol/L — ABNORMAL LOW (ref 3.5–5.1)
Sodium: 140 mmol/L (ref 135–145)

## 2016-02-02 MED ORDER — POTASSIUM CHLORIDE CRYS ER 20 MEQ PO TBCR
40.0000 meq | EXTENDED_RELEASE_TABLET | Freq: Once | ORAL | Status: AC
  Administered 2016-02-02: 40 meq via ORAL
  Filled 2016-02-02: qty 2

## 2016-02-02 MED ORDER — OXYCODONE-ACETAMINOPHEN 5-325 MG PO TABS
2.0000 | ORAL_TABLET | Freq: Once | ORAL | Status: AC
  Administered 2016-02-02: 2 via ORAL
  Filled 2016-02-02: qty 2
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
## 2016-02-02 NOTE — ED Notes (Signed)
Pt ambulated with 2 person assist- She ambulates heel to toe and speaks in complete sentences throughout - however, she complains of extreme dizziness. She is returned to the stretcher and Sprite is provided per her request
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
## 2016-02-02 NOTE — ED Notes (Signed)
Family at bedside; lights dimmed for comfort

## 2016-02-02 NOTE — ED Notes (Signed)
Reportt to [place], Therapist, sports

## 2016-02-02 NOTE — ED Notes (Signed)
Awaiting hospitalist 

## 2016-02-02 NOTE — ED Notes (Signed)
Report recieved 

## 2016-02-02 NOTE — ED Notes (Signed)
Lac repair by Mr Marshell Levan
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
## 2016-02-02 NOTE — ED Notes (Signed)
Daughter to desk to request physician speak to other family members sister and grandson who is a EMS- Physician in to discuss with several times concerning their desire to have pt admitted

## 2016-02-02 NOTE — ED Notes (Signed)
Posterior scalp abrasion cleaned with saline. Pt tolerated well.

## 2016-02-02 NOTE — ED Notes (Signed)
Wound care provided to pt left elbow. Skin tear cleaned with normal saline and dressed with telfa and secured with paper tape. nad noted. Pt tolerated well.   Dried blood cleaned from bilateral hands as well.

## 2016-02-02 NOTE — ED Notes (Signed)
To bedside commode

## 2016-02-03 ENCOUNTER — Encounter (HOSPITAL_COMMUNITY): Payer: Self-pay | Admitting: Neurological Surgery

## 2016-02-03 DIAGNOSIS — S02119A Unspecified fracture of occiput, initial encounter for closed fracture: Secondary | ICD-10-CM | POA: Diagnosis not present

## 2016-02-03 DIAGNOSIS — S0291XA Unspecified fracture of skull, initial encounter for closed fracture: Secondary | ICD-10-CM | POA: Diagnosis present

## 2016-02-03 LAB — GLUCOSE, CAPILLARY
Glucose-Capillary: 187 mg/dL — ABNORMAL HIGH (ref 65–99)
Glucose-Capillary: 197 mg/dL — ABNORMAL HIGH (ref 65–99)
Glucose-Capillary: 216 mg/dL — ABNORMAL HIGH (ref 65–99)
Glucose-Capillary: 237 mg/dL — ABNORMAL HIGH (ref 65–99)
Glucose-Capillary: 303 mg/dL — ABNORMAL HIGH (ref 65–99)

## 2016-02-03 MED ORDER — INSULIN ASPART 100 UNIT/ML ~~LOC~~ SOLN
0.0000 [IU] | Freq: Three times a day (TID) | SUBCUTANEOUS | Status: DC
  Administered 2016-02-04 (×2): 3 [IU] via SUBCUTANEOUS

## 2016-02-03 MED ORDER — OXYCODONE HCL 5 MG PO TABS
5.0000 mg | ORAL_TABLET | ORAL | Status: DC | PRN
  Administered 2016-02-03 – 2016-02-04 (×3): 5 mg via ORAL
  Filled 2016-02-03 (×3): qty 1

## 2016-02-03 MED ORDER — OXYCODONE-ACETAMINOPHEN 10-325 MG PO TABS
1.0000 | ORAL_TABLET | ORAL | Status: DC | PRN
Start: 2016-02-03 — End: 2016-02-03

## 2016-02-03 MED ORDER — PANTOPRAZOLE SODIUM 40 MG PO TBEC
40.0000 mg | DELAYED_RELEASE_TABLET | Freq: Every day | ORAL | Status: DC
  Administered 2016-02-03 – 2016-02-04 (×2): 40 mg via ORAL
  Filled 2016-02-03 (×2): qty 1

## 2016-02-03 MED ORDER — ONDANSETRON HCL 4 MG/2ML IJ SOLN
4.0000 mg | Freq: Three times a day (TID) | INTRAMUSCULAR | Status: DC | PRN
Start: 2016-02-03 — End: 2016-02-03

## 2016-02-03 MED ORDER — OXYCODONE-ACETAMINOPHEN 5-325 MG PO TABS
1.0000 | ORAL_TABLET | ORAL | Status: DC | PRN
Start: 2016-02-03 — End: 2016-02-04
  Administered 2016-02-03 – 2016-02-04 (×5): 1 via ORAL
  Filled 2016-02-03 (×5): qty 1

## 2016-02-03 MED ORDER — DULOXETINE HCL 60 MG PO CPEP
60.0000 mg | ORAL_CAPSULE | Freq: Every day | ORAL | Status: DC
  Administered 2016-02-03 – 2016-02-04 (×2): 60 mg via ORAL
  Filled 2016-02-03 (×2): qty 1

## 2016-02-03 MED ORDER — RANOLAZINE ER 500 MG PO TB12
1000.0000 mg | ORAL_TABLET | Freq: Two times a day (BID) | ORAL | Status: DC
  Administered 2016-02-03 – 2016-02-04 (×3): 1000 mg via ORAL
  Filled 2016-02-03 (×3): qty 2

## 2016-02-03 MED ORDER — ACETAMINOPHEN 500 MG PO TABS: 500.0000 mg | ORAL_TABLET | Freq: Four times a day (QID) | ORAL | Status: DC | PRN

## 2016-02-03 MED ORDER — INSULIN ASPART 100 UNIT/ML ~~LOC~~ SOLN: 0.0000 [IU] | Freq: Every day | SUBCUTANEOUS | Status: DC

## 2016-02-03 MED ORDER — POTASSIUM CHLORIDE IN NACL 20-0.9 MEQ/L-% IV SOLN
INTRAVENOUS | Status: DC
  Administered 2016-02-03 (×2): via INTRAVENOUS
  Filled 2016-02-03 (×3): qty 1000

## 2016-02-03 MED ORDER — TAMSULOSIN HCL 0.4 MG PO CAPS
0.4000 mg | ORAL_CAPSULE | Freq: Every day | ORAL | Status: DC
  Administered 2016-02-03 – 2016-02-04 (×2): 0.4 mg via ORAL
  Filled 2016-02-03 (×2): qty 1

## 2016-02-03 MED ORDER — METOCLOPRAMIDE HCL 10 MG PO TABS
10.0000 mg | ORAL_TABLET | Freq: Three times a day (TID) | ORAL | Status: DC
  Administered 2016-02-03 – 2016-02-04 (×5): 10 mg via ORAL
  Filled 2016-02-03 (×5): qty 1

## 2016-02-03 MED ORDER — TOPIRAMATE 100 MG PO TABS
100.0000 mg | ORAL_TABLET | Freq: Every day | ORAL | Status: DC
  Administered 2016-02-03: 100 mg via ORAL
  Filled 2016-02-03: qty 1

## 2016-02-03 MED ORDER — FENTANYL 25 MCG/HR TD PT72
25.0000 ug | MEDICATED_PATCH | TRANSDERMAL | Status: DC
  Administered 2016-02-03: 25 ug via TRANSDERMAL
  Filled 2016-02-03: qty 1

## 2016-02-03 MED ORDER — LATANOPROST 0.005 % OP SOLN
1.0000 [drp] | Freq: Every day | OPHTHALMIC | Status: DC
  Administered 2016-02-03: 1 [drp] via OPHTHALMIC
  Filled 2016-02-03: qty 2.5

## 2016-02-03 MED ORDER — METFORMIN HCL ER 500 MG PO TB24
500.0000 mg | ORAL_TABLET | Freq: Every day | ORAL | Status: DC
  Administered 2016-02-03 – 2016-02-04 (×2): 500 mg via ORAL
  Filled 2016-02-03 (×2): qty 1

## 2016-02-03 MED ORDER — HYDROMORPHONE HCL 1 MG/ML IJ SOLN: 0.5000 mg | Freq: Four times a day (QID) | INTRAMUSCULAR | Status: DC | PRN

## 2016-02-03 MED ORDER — FENTANYL 25 MCG/HR TD PT72: 50.0000 ug | MEDICATED_PATCH | TRANSDERMAL | Status: DC

## 2016-02-03 MED ORDER — DOCUSATE SODIUM 100 MG PO CAPS
100.0000 mg | ORAL_CAPSULE | Freq: Every day | ORAL | Status: DC
  Administered 2016-02-03 – 2016-02-04 (×2): 100 mg via ORAL
  Filled 2016-02-03 (×2): qty 1
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
## 2016-02-03 NOTE — Progress Notes (Signed)
Patient ID: [name], female   DOB: 11-17-1940, 75 y.o.   MRN: [id number] No change in exam. Continues to complain of headache and neck pain and low back pain. Awake and alert and conversant. Walk about 50 feet with a physical therapist who I spoke with. We will keep her in the hospital for now. I will make Dr. Saintclair Halsted aware of her admission.
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
## 2016-02-04 DIAGNOSIS — S02119A Unspecified fracture of occiput, initial encounter for closed fracture: Secondary | ICD-10-CM | POA: Diagnosis not present

## 2016-02-04 LAB — GLUCOSE, CAPILLARY
Glucose-Capillary: 177 mg/dL — ABNORMAL HIGH (ref 65–99)
Glucose-Capillary: 179 mg/dL — ABNORMAL HIGH (ref 65–99)
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
## 2016-02-04 NOTE — Care Management Obs Status (Signed)
[place] NOTIFICATION   Patient Details  Name: [name] MRN: [id number] Date of Birth: 06-15-1940   Medicare Observation Status Notification Given:  Yes    Pollie Friar, RN 02/04/2016, 1:25 PM

## 2016-02-04 NOTE — Progress Notes (Signed)
Patient discharged home with family. Patient escorted to car in wheelchair with nurse tech. Neuro assessment unchanged, denies any pain, states she has mild dizziness when changing positions. MD notified of dizziness, MD expects dizziness. HH PT set up per CM. Gilford Rile was taken with patient. Discharge instructions given to patient. F/U with Dr. Saintclair Halsted made for sept 12. Patient is aware.

## 2016-02-04 NOTE — Care Management Note (Signed)
Case Management Note  Patient Details  Name: [name] MRN: [id number] Date of Birth: [birth year]  Subjective/Objective:                    Action/Plan: Pt discharging home with [facility] services. CM met with the patient and her daughter and provided them a list of [facility] agencies in the [place] area. They selected [facility]. CM spoke with [facility] and they accepted the referral. Information they requested was faxed. Bedside RN upated.   Expected Discharge Date:                  Expected Discharge Plan:  [place]  In-House Referral:     Discharge planning Services  CM Consult  Post Acute Care Choice:  Home Health Choice offered to:  Patient  DME Arranged:    DME Agency:     HH Arranged:  PT [place]:  [facility]  Status of Service:  Completed, signed off  If discussed at [place] of Stay Meetings, dates discussed:    Additional Comments:  Pollie Friar, RN 02/04/2016, 4:11 PM

## 2016-02-04 NOTE — Progress Notes (Signed)
Patient ID: PRISMA BROM, female   DOB: 10-06-1940, 75 y.o.   MRN: [id number]  Doing Well,  condition of headache and pain no nausea  Neurologically nonfocal  Discharge home

## 2016-02-04 NOTE — Discharge Instructions (Signed)
No lifting no bending no twisting no driving  Head Injury, Adult You have a head injury. Headaches and throwing up (vomiting) are common after a head injury. It should be easy to wake up from sleeping. Sometimes you must stay in the hospital. Most problems happen within the first 24 hours. Side effects may occur up to 7-10 days after the injury.  WHAT ARE THE TYPES OF HEAD INJURIES? Head injuries can be as minor as a bump. Some head injuries can be more severe. More severe head injuries include:  A jarring injury to the brain (concussion).  A bruise of the brain (contusion). This mean there is bleeding in the brain that can cause swelling.  A cracked skull (skull fracture).  Bleeding in the brain that collects, clots, and forms a bump (hematoma). WHEN SHOULD I GET HELP RIGHT AWAY?   You are confused or sleepy.  You cannot be woken up.  You feel sick to your stomach (nauseous) or keep throwing up (vomiting).  Your dizziness or unsteadiness is getting worse.  You have very bad, lasting headaches that are not helped by medicine. Take medicines only as told by your doctor.  You cannot use your arms or legs like normal.  You cannot walk.  You notice changes in the black spots in the center of the colored part of your eye (pupil).  You have clear or bloody fluid coming from your nose or ears.  You have trouble seeing. During the next 24 hours after the injury, you must stay with someone who can watch you. This person should get help right away (call 911 in the [place] if you start to shake and are not able to control it (have seizures), you pass out, or you are unable to wake up. HOW CAN I PREVENT A HEAD INJURY IN THE FUTURE?  Wear seat belts.  Wear a helmet while bike riding and playing sports like football.  Stay away from dangerous activities around the house. WHEN CAN I RETURN TO NORMAL ACTIVITIES AND ATHLETICS? See your doctor before doing these activities. You should not do  normal activities or play contact sports until 1 week after the following symptoms have stopped:  Headache that does not go away.  Dizziness.  Poor attention.  Confusion.  Memory problems.  Sickness to your stomach or throwing up.  Tiredness.  Fussiness.  Bothered by bright lights or loud noises.  Anxiousness or depression.  Restless sleep. MAKE SURE YOU:   Understand these instructions.  Will watch your condition.  Will get help right away if you are not doing well or get worse.   This information is not intended to replace advice given to you by your health care provider. Make sure you discuss any questions you have with your health care provider.   Document Released: 05/08/2008 Document Revised: 06/16/2014 Document Reviewed: 01/31/2013 Elsevier Interactive Patient Education [employer].
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
## 2016-02-21 ENCOUNTER — Other Ambulatory Visit (HOSPITAL_COMMUNITY): Payer: Self-pay | Admitting: Neurosurgery

## 2016-02-21 DIAGNOSIS — R1909 Other intra-abdominal and pelvic swelling, mass and lump: Secondary | ICD-10-CM

## 2016-03-04 ENCOUNTER — Ambulatory Visit (HOSPITAL_COMMUNITY)
Admission: RE | Admit: 2016-03-04 | Discharge: 2016-03-04 | Disposition: A | Discharge status: Home / Self Care | Payer: Medicare Other | Source: Ambulatory Visit | Attending: Neurosurgery | Admitting: Neurosurgery

## 2016-03-04 DIAGNOSIS — M4317 Spondylolisthesis, lumbosacral region: Secondary | ICD-10-CM | POA: Diagnosis not present

## 2016-03-04 DIAGNOSIS — M4807 Spinal stenosis, lumbosacral region: Secondary | ICD-10-CM | POA: Diagnosis not present

## 2016-03-04 DIAGNOSIS — R600 Localized edema: Secondary | ICD-10-CM | POA: Insufficient documentation

## 2016-03-04 DIAGNOSIS — R1909 Other intra-abdominal and pelvic swelling, mass and lump: Secondary | ICD-10-CM

## 2016-03-04 DIAGNOSIS — R1903 Right lower quadrant abdominal swelling, mass and lump: Secondary | ICD-10-CM | POA: Insufficient documentation

## 2016-03-04 MED ORDER — GADOBENATE DIMEGLUMINE 529 MG/ML IV SOLN
10.0000 mL | Freq: Once | INTRAVENOUS | Status: AC | PRN
  Administered 2016-03-04: 10 mL via INTRAVENOUS

## 2016-03-20 ENCOUNTER — Ambulatory Visit (INDEPENDENT_AMBULATORY_CARE_PROVIDER_SITE_OTHER): Payer: Medicare Other | Admitting: Otolaryngology

## 2016-03-20 DIAGNOSIS — H6123 Impacted cerumen, bilateral: Secondary | ICD-10-CM

## 2016-03-20 DIAGNOSIS — H903 Sensorineural hearing loss, bilateral: Secondary | ICD-10-CM

## 2016-03-20 DIAGNOSIS — H9202 Otalgia, left ear: Secondary | ICD-10-CM | POA: Diagnosis not present

## 2016-04-16 ENCOUNTER — Other Ambulatory Visit: Payer: Self-pay | Admitting: Neurosurgery

## 2016-04-16 DIAGNOSIS — G44311 Acute post-traumatic headache, intractable: Secondary | ICD-10-CM

## 2016-04-24 ENCOUNTER — Ambulatory Visit
Admission: RE | Admit: 2016-04-24 | Discharge: 2016-04-24 | Disposition: A | Discharge status: Home / Self Care | Payer: Medicare Other | Source: Ambulatory Visit | Attending: Neurosurgery | Admitting: Neurosurgery

## 2016-04-24 DIAGNOSIS — G44311 Acute post-traumatic headache, intractable: Secondary | ICD-10-CM

## 2016-06-24 DIAGNOSIS — F419 Anxiety disorder, unspecified: Secondary | ICD-10-CM | POA: Insufficient documentation

## 2016-06-24 DIAGNOSIS — R339 Retention of urine, unspecified: Secondary | ICD-10-CM | POA: Insufficient documentation

## 2016-07-01 ENCOUNTER — Ambulatory Visit (INDEPENDENT_AMBULATORY_CARE_PROVIDER_SITE_OTHER): Payer: Medicare Other | Admitting: Internal Medicine

## 2016-07-01 ENCOUNTER — Encounter (INDEPENDENT_AMBULATORY_CARE_PROVIDER_SITE_OTHER): Payer: Self-pay

## 2016-07-01 ENCOUNTER — Encounter (INDEPENDENT_AMBULATORY_CARE_PROVIDER_SITE_OTHER): Payer: Self-pay | Admitting: Internal Medicine

## 2016-07-01 VITALS — BP 130/80 | HR 68 | Temp 98.7°F | Resp 18 | Ht 61.0 in | Wt 132.0 lb

## 2016-07-01 DIAGNOSIS — K219 Gastro-esophageal reflux disease without esophagitis: Secondary | ICD-10-CM | POA: Diagnosis not present

## 2016-07-01 DIAGNOSIS — E1143 Type 2 diabetes mellitus with diabetic autonomic (poly)neuropathy: Secondary | ICD-10-CM

## 2016-07-01 DIAGNOSIS — Z862 Personal history of diseases of the blood and blood-forming organs and certain disorders involving the immune mechanism: Secondary | ICD-10-CM

## 2016-07-01 DIAGNOSIS — K3184 Gastroparesis: Secondary | ICD-10-CM | POA: Diagnosis not present

## 2016-07-01 MED ORDER — METOCLOPRAMIDE HCL 10 MG PO TABS: 10.0000 mg | ORAL_TABLET | Freq: Three times a day (TID) | ORAL | 1 refills | Status: DC

## 2016-07-01 NOTE — Progress Notes (Signed)
Presenting complaint;  Follow-up for diabetic gastroparesis GERD and she has history of iron deficiency anemia.  Subjective:  [name] 76 year old Caucasian female who is here for scheduled visit accompanied by her sister. Lately she's been having problems with nausea and inability to eat. She also has been having more episodes of heartburn. She has contacted Dr. Alyson Ingles to arrange for Botox therapy to pyloric channel. She states she has had 2 possibly 3 treatments and it has provided significant relief for 6 months every time. She also complains of dysphagia particularly with meats. She will also let her physician at [facility] at the time of EGD. She generally has 3 bowel movements per week. She denies melena or rectal bleeding. She has not lost any weight since her last visit. He says she had blood work by Dr. Karie Kirks last month and her hemoglobin was normal.    Current Medications: Outpatient Encounter Prescriptions as of 07/01/2016  Medication Sig  . acetaminophen (TYLENOL) 500 MG tablet Take 500 mg by mouth every 6 (six) hours as needed for mild pain.   Marland Kitchen ALPRAZolam (XANAX XR) 1 MG 24 hr tablet Take 0.5-1 mg by mouth as needed for sleep (Anxiety).   . Calcium Carbonate-Vitamin D (CALCIUM + D PO) Take 1 tablet by mouth daily. TAKING 1,000 CALCIUM AND 2,000 VIT D  . Cyanocobalamin (VITAMIN B 12 PO) Take 2,000 mcg by mouth daily.  Marland Kitchen dexlansoprazole (DEXILANT) 60 MG capsule Take 1 capsule (60 mg total) by mouth daily before breakfast.  . docusate sodium (COLACE) 100 MG capsule Take 100 mg by mouth daily.   . DULoxetine (CYMBALTA) 60 MG capsule Take 60 mg by mouth daily.  . famotidine (PEPCID) 20 MG tablet Take 2 tablets (40 mg total) by mouth at bedtime.  . ferrous sulfate 325 (65 FE) MG tablet Take 325 mg by mouth daily with breakfast. Patient states that she does not take every day.  . metFORMIN (GLUMETZA) 500 MG (MOD) 24 hr tablet Take 500 mg by mouth daily with breakfast.  . metoCLOPramide  (REGLAN) 10 MG tablet Take 1 tablet (10 mg total) by mouth 3 (three) times daily before meals.  Marland Kitchen MISC NATURAL PRODUCTS ER PO Take 1-3 capsules by mouth 2 (two) times daily. Digestive Blend Softgels  . mometasone (NASONEX) 50 MCG/ACT nasal spray Place 2 sprays into the nose daily as needed (Allergies).   . Multiple Vitamins-Minerals (MULTIVITAMINS THER. W/MINERALS) TABS tablet Take 1 tablet by mouth daily.  Marland Kitchen oxyCODONE-acetaminophen (PERCOCET) 10-325 MG tablet Take 1 tablet by mouth every 4 (four) hours as needed for pain.  . polyethylene glycol (MIRALAX / GLYCOLAX) packet Take 17 g by mouth daily as needed for mild constipation.   . ranolazine (RANEXA) 1000 MG SR tablet Take 1 tablet (1,000 mg total) by mouth 2 (two) times daily.  . silodosin (RAPAFLO) 8 MG CAPS capsule Take 8 mg by mouth daily.  Marland Kitchen topiramate (TOPAMAX) 100 MG tablet Take 100 mg by mouth at bedtime.  . Travoprost, BAK Free, (TRAVATAN) 0.004 % SOLN ophthalmic solution Place 1 drop into both eyes at bedtime.  . [DISCONTINUED] fentaNYL (DURAGESIC - DOSED MCG/HR) 50 MCG/HR Place 50 mcg onto the skin every 3 (three) days.  . [DISCONTINUED] hydrocortisone (ANUSOL-HC) 25 MG suppository Place 1 suppository (25 mg total) rectally at bedtime. (Patient not taking: Reported on 07/01/2016)   No facility-administered encounter medications on file as of 07/01/2016.      Objective: Blood pressure 130/80, pulse 68, temperature 98.7 F (37.1 C), temperature source  Oral, resp. rate 18, height 5\' 1"  (1.549 m), weight 132 lb (59.9 kg). Patient is alert and in no acute distress. Conjunctiva is pink. Sclera is nonicteric No tremor or abnormal movement noted to lip or tongue. Oropharyngeal mucosa is normal. No neck masses or thyromegaly noted. Cardiac exam with regular rhythm normal S1 and S2. No murmur or gallop noted. Lungs are clear to auscultation. Abdomen symmetrical and soft with mild midepigastric tenderness. No organomegaly or masses. No  LE edema or clubbing noted. No hand tremors noted.  Labs/studies Results: H&H was 11.9 and 37 on 02/02/2016.    Assessment:  #1. Diabetic gastroparesis. She is on metoclopramide which she luckily is tolerating without any side effects. Diet and metoclopramide has not been effective in satisfactory symptom control. It appears she is in need of repeat Botox therapy. She is in the process of arranging this at [facility]. #2. GERD. Lately she's had more heartburn and nausea and it may be due to gastroparesis rather than primary process. #3. History of iron deficiency anemia. Iron deficiency anemia appears to be due to impaired iron absorption due to chronic PPI therapy. Last colonoscopy was in January 2017. #4. History of colon carcinoma. He remains in remission. Last colonoscopy was in January 2017 when she had 3 small tubular adenomas removed.   Plan:  New prescription for metoclopramide given for 3 months with one refill. Patient and her sister informed to contact office if she feels she is having side effects of metoclopramide. Request copy of blood work from Dr. Karie Kirks the office. Office visit in 6 months.

## 2016-07-01 NOTE — Patient Instructions (Signed)
Call if you experience any side effects with metoclopramide. 

## 2016-07-02 ENCOUNTER — Encounter: Payer: Self-pay | Admitting: Physical Medicine & Rehabilitation

## 2016-07-02 ENCOUNTER — Encounter: Payer: Medicare Other | Attending: Physical Medicine & Rehabilitation | Admitting: Physical Medicine & Rehabilitation

## 2016-07-02 VITALS — BP 149/84 | HR 59 | Resp 14

## 2016-07-02 DIAGNOSIS — Z8711 Personal history of peptic ulcer disease: Secondary | ICD-10-CM | POA: Insufficient documentation

## 2016-07-02 DIAGNOSIS — H8110 Benign paroxysmal vertigo, unspecified ear: Secondary | ICD-10-CM | POA: Diagnosis not present

## 2016-07-02 DIAGNOSIS — Z8249 Family history of ischemic heart disease and other diseases of the circulatory system: Secondary | ICD-10-CM | POA: Diagnosis not present

## 2016-07-02 DIAGNOSIS — Z833 Family history of diabetes mellitus: Secondary | ICD-10-CM | POA: Insufficient documentation

## 2016-07-02 DIAGNOSIS — Z85038 Personal history of other malignant neoplasm of large intestine: Secondary | ICD-10-CM | POA: Insufficient documentation

## 2016-07-02 DIAGNOSIS — R51 Headache: Secondary | ICD-10-CM | POA: Insufficient documentation

## 2016-07-02 DIAGNOSIS — Z5181 Encounter for therapeutic drug level monitoring: Secondary | ICD-10-CM

## 2016-07-02 DIAGNOSIS — G8929 Other chronic pain: Secondary | ICD-10-CM | POA: Insufficient documentation

## 2016-07-02 DIAGNOSIS — N289 Disorder of kidney and ureter, unspecified: Secondary | ICD-10-CM | POA: Diagnosis not present

## 2016-07-02 DIAGNOSIS — E1143 Type 2 diabetes mellitus with diabetic autonomic (poly)neuropathy: Secondary | ICD-10-CM | POA: Insufficient documentation

## 2016-07-02 DIAGNOSIS — Z87891 Personal history of nicotine dependence: Secondary | ICD-10-CM | POA: Insufficient documentation

## 2016-07-02 DIAGNOSIS — K3184 Gastroparesis: Secondary | ICD-10-CM | POA: Insufficient documentation

## 2016-07-02 DIAGNOSIS — F419 Anxiety disorder, unspecified: Secondary | ICD-10-CM | POA: Insufficient documentation

## 2016-07-02 DIAGNOSIS — R413 Other amnesia: Secondary | ICD-10-CM | POA: Diagnosis not present

## 2016-07-02 DIAGNOSIS — K219 Gastro-esophageal reflux disease without esophagitis: Secondary | ICD-10-CM | POA: Diagnosis not present

## 2016-07-02 DIAGNOSIS — F0781 Postconcussional syndrome: Secondary | ICD-10-CM | POA: Diagnosis not present

## 2016-07-02 DIAGNOSIS — M797 Fibromyalgia: Secondary | ICD-10-CM | POA: Insufficient documentation

## 2016-07-02 DIAGNOSIS — Z8673 Personal history of transient ischemic attack (TIA), and cerebral infarction without residual deficits: Secondary | ICD-10-CM | POA: Insufficient documentation

## 2016-07-02 DIAGNOSIS — H8113 Benign paroxysmal vertigo, bilateral: Secondary | ICD-10-CM | POA: Diagnosis not present

## 2016-07-02 DIAGNOSIS — Z8051 Family history of malignant neoplasm of kidney: Secondary | ICD-10-CM | POA: Diagnosis not present

## 2016-07-02 DIAGNOSIS — Z981 Arthrodesis status: Secondary | ICD-10-CM | POA: Diagnosis not present

## 2016-07-02 DIAGNOSIS — E1142 Type 2 diabetes mellitus with diabetic polyneuropathy: Secondary | ICD-10-CM | POA: Diagnosis not present

## 2016-07-02 DIAGNOSIS — M5136 Other intervertebral disc degeneration, lumbar region: Secondary | ICD-10-CM | POA: Insufficient documentation

## 2016-07-02 DIAGNOSIS — Z9181 History of falling: Secondary | ICD-10-CM | POA: Diagnosis not present

## 2016-07-02 DIAGNOSIS — Z79899 Other long term (current) drug therapy: Secondary | ICD-10-CM

## 2016-07-02 MED ORDER — TOPIRAMATE 50 MG PO TABS: 50.0000 mg | ORAL_TABLET | ORAL | 2 refills | Status: DC

## 2016-07-02 NOTE — Patient Instructions (Signed)
TRY TO WORK ON GETTING 8-10 HOURS OF SLEEP PER NIGHT   SLEEP HYGIENE---FIND WAYS "TO WIND DOWN"   YOU NEED A CONSISTENT BED TIME.   MAKING SURE YOU CAN HEAR WHAT PEOPLE SAY!!   TAKE ONE TASK AT A TIME. AVOID MULT-TASKING   PLEASE FEEL FREE TO CALL OUR OFFICE WITH ANY PROBLEMS OR QUESTIONS [phone])
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
## 2016-07-02 NOTE — Addendum Note (Signed)
Addended by: Marland Mcalpine B on: 07/02/2016 01:35 PM   Modules accepted: Orders

## 2016-07-04 ENCOUNTER — Emergency Department (HOSPITAL_COMMUNITY): Payer: Medicare Other

## 2016-07-04 ENCOUNTER — Encounter (HOSPITAL_COMMUNITY): Payer: Self-pay

## 2016-07-04 ENCOUNTER — Emergency Department (HOSPITAL_COMMUNITY)
Admission: EM | Admit: 2016-07-04 | Discharge: 2016-07-04 | Disposition: A | Discharge status: Other Acute Inpatient Hospital | Payer: Medicare Other | Attending: Emergency Medicine | Admitting: Emergency Medicine

## 2016-07-04 DIAGNOSIS — E119 Type 2 diabetes mellitus without complications: Secondary | ICD-10-CM | POA: Insufficient documentation

## 2016-07-04 DIAGNOSIS — K9189 Other postprocedural complications and disorders of digestive system: Secondary | ICD-10-CM | POA: Insufficient documentation

## 2016-07-04 DIAGNOSIS — Z7984 Long term (current) use of oral hypoglycemic drugs: Secondary | ICD-10-CM | POA: Insufficient documentation

## 2016-07-04 DIAGNOSIS — K631 Perforation of intestine (nontraumatic): Secondary | ICD-10-CM | POA: Diagnosis not present

## 2016-07-04 DIAGNOSIS — Z79899 Other long term (current) drug therapy: Secondary | ICD-10-CM | POA: Diagnosis not present

## 2016-07-04 DIAGNOSIS — I10 Essential (primary) hypertension: Secondary | ICD-10-CM | POA: Diagnosis not present

## 2016-07-04 DIAGNOSIS — Z85038 Personal history of other malignant neoplasm of large intestine: Secondary | ICD-10-CM | POA: Diagnosis not present

## 2016-07-04 DIAGNOSIS — Z87891 Personal history of nicotine dependence: Secondary | ICD-10-CM | POA: Insufficient documentation

## 2016-07-04 DIAGNOSIS — A419 Sepsis, unspecified organism: Secondary | ICD-10-CM

## 2016-07-04 DIAGNOSIS — R1011 Right upper quadrant pain: Secondary | ICD-10-CM | POA: Diagnosis present

## 2016-07-04 LAB — CBC WITH DIFFERENTIAL/PLATELET
Basophils Absolute: 0 10*3/uL (ref 0.0–0.1)
Basophils Relative: 0 %
Eosinophils Absolute: 0 10*3/uL (ref 0.0–0.7)
Eosinophils Relative: 0 %
HCT: 36 % (ref 36.0–46.0)
Hemoglobin: 11.9 g/dL — ABNORMAL LOW (ref 12.0–15.0)
Lymphocytes Relative: 8 %
Lymphs Abs: 1.1 10*3/uL (ref 0.7–4.0)
MCH: 31.2 pg (ref 26.0–34.0)
MCHC: 33.1 g/dL (ref 30.0–36.0)
MCV: 94.5 fL (ref 78.0–100.0)
Monocytes Absolute: 0.7 10*3/uL (ref 0.1–1.0)
Monocytes Relative: 5 %
Neutro Abs: 12 10*3/uL — ABNORMAL HIGH (ref 1.7–7.7)
Neutrophils Relative %: 87 %
Platelets: 211 10*3/uL (ref 150–400)
RBC: 3.81 MIL/uL — ABNORMAL LOW (ref 3.87–5.11)
RDW: 12.9 % (ref 11.5–15.5)
WBC: 13.8 10*3/uL — ABNORMAL HIGH (ref 4.0–10.5)

## 2016-07-04 LAB — HEPATIC FUNCTION PANEL
ALT: 13 U/L — ABNORMAL LOW (ref 14–54)
AST: 12 U/L — ABNORMAL LOW (ref 15–41)
Albumin: 3.8 g/dL (ref 3.5–5.0)
Alkaline Phosphatase: 36 U/L — ABNORMAL LOW (ref 38–126)
Bilirubin, Direct: 0.2 mg/dL (ref 0.1–0.5)
Indirect Bilirubin: 0.6 mg/dL (ref 0.3–0.9)
Total Bilirubin: 0.8 mg/dL (ref 0.3–1.2)
Total Protein: 6.9 g/dL (ref 6.5–8.1)

## 2016-07-04 LAB — URINALYSIS, ROUTINE W REFLEX MICROSCOPIC
Bilirubin Urine: NEGATIVE
Glucose, UA: 500 mg/dL — AB
Hgb urine dipstick: NEGATIVE
Leukocytes, UA: NEGATIVE
Nitrite: NEGATIVE
Protein, ur: NEGATIVE mg/dL
Specific Gravity, Urine: 1.01 (ref 1.005–1.030)
pH: 7 (ref 5.0–8.0)

## 2016-07-04 LAB — I-STAT CHEM 8, ED
BUN: 8 mg/dL (ref 6–20)
Calcium, Ion: 1.26 mmol/L (ref 1.15–1.40)
Chloride: 97 mmol/L — ABNORMAL LOW (ref 101–111)
Creatinine, Ser: 0.6 mg/dL (ref 0.44–1.00)
Glucose, Bld: 254 mg/dL — ABNORMAL HIGH (ref 65–99)
HCT: 38 % (ref 36.0–46.0)
Hemoglobin: 12.9 g/dL (ref 12.0–15.0)
Potassium: 2.9 mmol/L — ABNORMAL LOW (ref 3.5–5.1)
Sodium: 134 mmol/L — ABNORMAL LOW (ref 135–145)
TCO2: 24 mmol/L (ref 0–100)

## 2016-07-04 LAB — I-STAT CG4 LACTIC ACID, ED: Lactic Acid, Venous: 6.54 mmol/L (ref 0.5–1.9)

## 2016-07-04 MED ORDER — IOPAMIDOL (ISOVUE-300) INJECTION 61%
100.0000 mL | Freq: Once | INTRAVENOUS | Status: AC | PRN
  Administered 2016-07-04: 100 mL via INTRAVENOUS

## 2016-07-04 MED ORDER — MAGNESIUM SULFATE 2 GM/50ML IV SOLN
2.0000 g | Freq: Once | INTRAVENOUS | Status: AC
  Administered 2016-07-04: 2 g via INTRAVENOUS
  Filled 2016-07-04: qty 50

## 2016-07-04 MED ORDER — HYDROMORPHONE HCL 1 MG/ML IJ SOLN
1.0000 mg | Freq: Once | INTRAMUSCULAR | Status: AC
  Administered 2016-07-04: 1 mg via INTRAVENOUS
  Filled 2016-07-04: qty 1

## 2016-07-04 MED ORDER — LEVOFLOXACIN IN D5W 750 MG/150ML IV SOLN
750.0000 mg | Freq: Once | INTRAVENOUS | Status: AC
  Administered 2016-07-04: 750 mg via INTRAVENOUS
  Filled 2016-07-04: qty 150

## 2016-07-04 MED ORDER — HYDROMORPHONE HCL 1 MG/ML IJ SOLN
1.0000 mg | Freq: Once | INTRAMUSCULAR | Status: AC
  Administered 2016-07-04: 1 mg via INTRAVENOUS

## 2016-07-04 MED ORDER — SODIUM CHLORIDE 0.9 % IV BOLUS (SEPSIS)
1000.0000 mL | Freq: Once | INTRAVENOUS | Status: AC
  Administered 2016-07-04: 1000 mL via INTRAVENOUS

## 2016-07-04 MED ORDER — PROMETHAZINE HCL 25 MG/ML IJ SOLN
12.5000 mg | Freq: Once | INTRAMUSCULAR | Status: AC
  Administered 2016-07-04: 12.5 mg via INTRAVENOUS
  Filled 2016-07-04: qty 1

## 2016-07-04 MED ORDER — POTASSIUM CHLORIDE 10 MEQ/100ML IV SOLN
10.0000 meq | INTRAVENOUS | Status: DC
  Administered 2016-07-04: 10 meq via INTRAVENOUS
  Filled 2016-07-04: qty 100

## 2016-07-04 MED ORDER — METRONIDAZOLE IN NACL 5-0.79 MG/ML-% IV SOLN: 500.0000 mg | Freq: Once | INTRAVENOUS | Status: DC

## 2016-07-04 MED ORDER — HYDROMORPHONE HCL 1 MG/ML IJ SOLN
INTRAMUSCULAR | Status: AC
  Filled 2016-07-04: qty 1

## 2016-07-04 NOTE — ED Triage Notes (Signed)
Pt had an EGD done at 1030 this am at [facility] and has been having pain in ruq abd since early afternoon.  Pt states she called the doctor and was told to come and get checked for a perforation

## 2016-07-04 NOTE — ED Notes (Signed)
Report given to [facility] with [facility].

## 2016-07-04 NOTE — ED Notes (Signed)
Dr. Lacinda Axon notified of pt's pain.  No new orders at this time due to sedation.

## 2016-07-04 NOTE — ED Provider Notes (Signed)
[facility] DEPT Provider Note   CSN: [id number] Arrival date & time: 07/04/16  0053     History   Chief Complaint Chief Complaint  Patient presents with  . Abdominal Pain    s/p EGD    HPI [name] is a 76 y.o. female.  HPI Pt comes in with cc of abd pain. Pt has hx of CAD, GERD, Gastroparesis and she is s/p EGD POD # 1. Pt's EGD was performed at [facility]. Pt reports that post procedure she had abd pain, which overtime has gotten worse. Her symptoms are worse over the RUQ. Pt has associated nausea and chills, no bleeding. No fevers, no diarrhea. Pt called [facility] and she was advised to go to the nearest ER.   Past Medical History:  Diagnosis Date  . Anemia   . Anxiety   . Arthritis    "all over" (04/19/2013)  . Bleeding stomach ulcer 1980's  . Bloating   . Chronic back pain   . Chronic headaches   . Chronic heartburn   . Chronic neck pain   . Chronic pain   . Colon cancer ([place])   . DDD (degenerative disc disease), lumbar   . Diabetic peripheral neuropathy ([place])    "in my feet" (04/19/2013)  . Dizziness   . Dysphagia   . Family history of anesthesia complication    "daughter has PONV too" (04/19/2013)  . Fibromyalgia   . Gastroesophageal reflux   . Gastroparesis   . Glaucoma, bilateral   . Hiatal hernia    "had it before; had OR; got it again" (04/19/2013)  . History of blood transfusion 1980's   "w/bleeding stomach ulcer" (04/19/2013)  . Hypertension    "for diabetes; don't have high blood pressure" (04/19/2013)  . Migraine    "take RX for it qd" (04/19/2013)  . Nausea   . Pneumonia 04/2012  . PONV (postoperative nausea and vomiting)   . Renal insufficiency   . TIA (transient ischemic attack)    "3-4 before starting RX; none since" (04/19/2013)  . Type II diabetes mellitus ([place])   . Walking pneumonia ~ 1966    Patient Active Problem List   Diagnosis Date Noted  . Post concussion syndrome 07/02/2016  . Skull fracture ([place]) 02/03/2016  .  Occipital fracture ([place]) 02/02/2016  . Lactic acidosis   . Ureteral calculus, right   . Nephrolithiasis 12/05/2014  . Elevated lactic acid level 12/05/2014  . Unstable angina ([place]) 04/19/2013  . History of GI bleed 04/19/2013  . Fibromyalgia 04/19/2013  . GERD (gastroesophageal reflux disease) 04/19/2013  . Coronary artery calcification seen on CAT scan 04/19/2013  . Bradycardia 04/19/2013  . History of anemia 12/02/2011  . History of colonic polyps 08/04/2011  . Diabetic gastroparesis ([place]) 08/04/2011  . Achalasia 08/04/2011  . Allergic rhinitis, cause unspecified 08/11/2010  . SPINAL STENOSIS 02/07/2008  . SPONDYLOLYSIS 02/07/2008  . 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 ABDOMINAL ADHESION SURGERY  ~ 2012   "repaired wrap where they did hiatal hernia OR too" 911/04/2013)  . ANTERIOR CERVICAL DISCECTOMY  ~ 2009   "only cleaned out arthritis and spurs" (04/19/2013)  . CATARACT EXTRACTION W/ INTRAOCULAR LENS IMPLANT Left 04/13/2013  . CHOLECYSTECTOMY  1980's  . COLON SURGERY    . COLONOSCOPY  10/11/2010  . COLONOSCOPY  11/23/2009  . COLONOSCOPY  07/28/2008   W/SNARE  . COLONOSCOPY  06/28/07  . COLONOSCOPY  05/10/07   W/POLYP  . COLONOSCOPY  12/28/00  . COLONOSCOPY N/A 06/29/2015   Procedure: COLONOSCOPY;  Surgeon: Rogene Houston, MD;  Location: [facility] ENDO [facility];  Service: Endoscopy;  Laterality: N/A;  135  . COLONOSCOPY WITH ESOPHAGOGASTRODUODENOSCOPY (EGD) N/A 05/13/2013   Procedure: COLONOSCOPY WITH ESOPHAGOGASTRODUODENOSCOPY (EGD);  Surgeon: Rogene Houston, MD;  Location: [facility] ENDO SUITE;  Service: Endoscopy;  Laterality: N/A;  855  . HEMICOLECTOMY  2010   ZIEGLER  . HIATAL HERNIA REPAIR  1970's  . LEFT HEART CATHETERIZATION WITH CORONARY ANGIOGRAM N/A 04/19/2013   Procedure: LEFT HEART CATHETERIZATION WITH CORONARY ANGIOGRAM;  Surgeon: Peter M Martinique, MD;  Location: [facility] C[facility] LAB;  Service: Cardiovascular;  Laterality: N/A;  .  [place]   "had a broken neck" (04/19/2013)  . TONSILLECTOMY  ~ 1953  . UPPER GASTROINTESTINAL ENDOSCOPY  10/11/2010   EGD ED  . UPPER GASTROINTESTINAL ENDOSCOPY  11/23/2009  . UPPER GASTROINTESTINAL ENDOSCOPY  05/10/07   EGD ED  . UPPER GASTROINTESTINAL ENDOSCOPY  08/11/01   EGD ED  . VAGINAL HYSTERECTOMY      OB History    Gravida Para Term Preterm AB Living   4 4 4          SAB TAB Ectopic Multiple Live Births                   Home Medications    Prior to Admission medications   Medication Sig Start Date End Date Taking? Authorizing Provider  acetaminophen (TYLENOL) 500 MG tablet Take 500 mg by mouth every 6 (six) hours as needed for mild pain.     Historical Provider, MD  ALPRAZolam (XANAX XR) 1 MG 24 hr tablet Take 0.5-1 mg by mouth as needed for sleep (Anxiety).     Historical Provider, MD  Calcium Carbonate-Vitamin D (CALCIUM + D PO) Take 1 tablet by mouth daily. TAKING 1,000 CALCIUM AND 2,000 VIT D    Historical Provider, MD  Cyanocobalamin (VITAMIN B 12 PO) Take 2,000 mcg by mouth daily.    Historical Provider, MD  dexlansoprazole (DEXILANT) 60 MG capsule Take 1 capsule (60 mg total) by mouth daily before breakfast. 12/25/14   Rogene Houston, MD  docusate sodium (COLACE) 100 MG capsule Take 100 mg by mouth daily.     Historical Provider, MD  DULoxetine (CYMBALTA) 60 MG capsule Take 60 mg by mouth daily.    Historical Provider, MD  famotidine (PEPCID) 20 MG tablet Take 2 tablets (40 mg total) by mouth at bedtime. 12/25/14   Rogene Houston, MD  ferrous sulfate 325 (65 FE) MG tablet Take 325 mg by mouth daily with breakfast. Patient states that she does not take every day.    Historical Provider, MD  metFORMIN (GLUMETZA) 500 MG (MOD) 24 hr tablet Take 500 mg by mouth daily with breakfast.    Historical Provider, MD  metoCLOPramide (REGLAN) 10 MG tablet Take 1 tablet (10 mg total) by mouth 3 (three) times daily before meals. 07/01/16   Rogene Houston, MD  MISC NATURAL PRODUCTS ER PO Take 1-3 capsules by mouth 2 (two) times daily. Digestive Blend Softgels    Historical Provider, MD  mometasone (NASONEX) 50 MCG/ACT nasal spray Place 2 sprays into the nose daily as needed (Allergies).     Historical Provider, MD  Multiple Vitamins-Minerals (MULTIVITAMINS THER. W/MINERALS) TABS tablet Take 1 tablet by mouth daily.    Historical Provider, MD  oxyCODONE-acetaminophen (PERCOCET) 10-325 MG tablet Take 1 tablet by mouth every 4 (four)  hours as needed for pain.    Historical Provider, MD  polyethylene glycol (MIRALAX / GLYCOLAX) packet Take 17 g by mouth daily as needed for mild constipation.     Historical Provider, MD  ranolazine (RANEXA) 1000 MG SR tablet Take 1 tablet (1,000 mg total) by mouth 2 (two) times daily. 10/31/15   Herminio Commons, MD  silodosin (RAPAFLO) 8 MG CAPS capsule Take 8 mg by mouth daily.    Historical Provider, MD  topiramate (TOPAMAX) 50 MG tablet Take 1 tablet (50 mg total) by mouth as directed. TAKE 1 TABLET IN MORNING AND 2 TABLETS IN EVENING. 3 MONTH SUPPLY 07/02/16   Meredith Staggers, MD  Travoprost, BAK Free, (TRAVATAN) 0.004 % SOLN ophthalmic solution Place 1 drop into both eyes at bedtime.    Historical Provider, MD    Family History Family History  Problem Relation Age of Onset  . Heart disease Mother   . Diabetes Mother   . Dementia Father   . Healthy Sister   . Diabetes Brother   . Kidney cancer Brother   . Diabetes Brother   . Neuropathy Brother   . Diabetes Daughter   . Diabetes Daughter   . Diabetes Son     Social History Social History  Substance Use Topics  . Smoking status: Former Smoker    Packs/day: 1.50    Years: 15.00    Types: Cigarettes    Quit date: 03/17/1991  . Smokeless tobacco: Never Used     Comment: Patient states that it has ben greater than 20 years since she quit smoking  . Alcohol use No     Allergies   Morphine; Aspirin; Cefuroxime axetil; Codeine; Diltiazem;  Dronabinol; Shellfish allergy; Ondansetron; and Penicillins   Review of Systems Review of Systems  ROS 10 Systems reviewed and are negative for acute change except as noted in the HPI.     Physical Exam Updated Vital Signs BP (!) 140/116 (BP Location: Left Arm)   Pulse 74   Temp 97.6 F (36.4 C) (Oral)   Resp 20   Ht 4\' 11"  (1.499 m)   Wt 131 lb (59.4 kg)   SpO2 96%   BMI 26.46 kg/m   Physical Exam  Constitutional: She is oriented to person, place, and time. She appears well-developed and well-nourished.  HENT:  Head: Normocephalic and atraumatic.  Eyes: EOM are normal. Pupils are equal, round, and reactive to light.  Neck: Neck supple.  Cardiovascular: Normal rate, regular rhythm and normal heart sounds.   No murmur heard. Pulmonary/Chest: Effort normal. No respiratory distress.  Abdominal: Soft. She exhibits no distension. There is tenderness. There is guarding. There is no rebound.  Neurological: She is alert and oriented to person, place, and time.  Skin: Skin is warm and dry.  Nursing note and vitals reviewed.    ED Treatments / Results  Labs (all labs ordered are listed, but only abnormal results are displayed) Labs Reviewed  CBC WITH DIFFERENTIAL/PLATELET - Abnormal; Notable for the following:       Result Value   WBC 13.8 (*)    RBC 3.81 (*)    Hemoglobin 11.9 (*)    Neutro Abs 12.0 (*)    All other components within normal limits  HEPATIC FUNCTION PANEL - Abnormal; Notable for the following:    AST 12 (*)    ALT 13 (*)    Alkaline Phosphatase 36 (*)    All other components within normal limits  I-STAT CHEM 8,  ED - Abnormal; Notable for the following:    Sodium 134 (*)    Potassium 2.9 (*)    Chloride 97 (*)    Glucose, Bld 254 (*)    All other components within normal limits  CULTURE, BLOOD (ROUTINE X 2)  CULTURE, BLOOD (ROUTINE X 2)  URINE CULTURE  URINALYSIS, ROUTINE W REFLEX MICROSCOPIC  I-STAT CG4 LACTIC ACID, ED    EKG  EKG  Interpretation None       Radiology Ct Abdomen Pelvis W Contrast  Result Date: 07/04/2016 CLINICAL DATA:  76 year old female status post EGD presenting with abdominal pain. Evaluate for perforation. EXAM: CT ABDOMEN AND PELVIS WITH CONTRAST TECHNIQUE: Multidetector CT imaging of the abdomen and pelvis was performed using the standard protocol following bolus administration of intravenous contrast. CONTRAST:  180mL ISOVUE-300 IOPAMIDOL (ISOVUE-300) INJECTION 61% COMPARISON:  Pelvic MRI dated 03/04/2016 and CT dated 02/02/2016 and CT abdomen pelvis dated 12/05/2014 FINDINGS: Lower chest: Minimal bibasilar dependent atelectatic changes of the lungs. There is multi vessel coronary vascular calcification. Tiny air bubble along the distal stomach (series 3, image 33, coronal series 6, image 28, and sagittal series 7, image 34) appears extraluminal and suspicious for microperforation. There is small ascites. Hepatobiliary: A 1 cm linear hypodensity along the inferior surface of the liver extending to the hepatic capsule and along the gallbladder fossa may represent a fold. A subcapsular liver laceration is not excluded if there is clinical concern for traumatic liver injury. Clinical correlation is recommended. The liver is otherwise unremarkable. Cholecystectomy. There is mild dilatation of the CBD measuring up to 10 mm likely post cholecystectomy. No retained CBD stone identified. Pancreas: The pancreas is unremarkable. Spleen: Normal in size without focal abnormality. Adrenals/Urinary Tract: There is a stable left adrenal lesion which demonstrates a heterogeneous and low enhancing characteristics. This lesion is not characterized on these CT but described as an adenoma previously. The right adrenal gland appears unremarkable. There is a 1 cm left renal upper hypodense lesion which is stable compared to the study of 02/17/2014 and most likely representing a cyst. Subcentimeter scattered bilateral renal hypodense  lesions are too small to characterize but likely represent cysts. There is no hydronephrosis on either side. There is no hydroureter. There is apparent urothelial enhancement of the midportion of the right ureter. Correlation with urinalysis recommended to exclude UTI. The urinary bladder is distended. Stomach/Bowel: There is a small hiatal hernia with gastroesophageal reflux. There is a 7 x 9 mm calcific structure within the gastric fundus. There is moderate stool throughout the colon. Scattered colonic diverticula without active inflammatory changes. There is postsurgical changes of the bowel with ileotransverse anastomosis. No evidence of bowel obstruction. Appendectomy. Vascular/Lymphatic: There is moderate aortoiliac atherosclerotic disease. The origins of the celiac axis, SMA, IMA as well as the origins of the renal arteries are patent. The SMV, splenic vein, and main portal vein are patent. No portal venous gas identified. There is no adenopathy. Reproductive: Hysterectomy. Other: Midline vertical anterior pelvic wall incisional scar. Musculoskeletal: There is osteopenia with degenerative changes of the spine. Old healed bilateral pubic bone fractures. There is bilateral L5 pars defects with grade 2 L5-S1 anterolisthesis. There is lumbar scoliosis. No acute fracture. IMPRESSION: Small ascites and a tiny pocket of extraluminal appearing air adjacent to the gastric antrum concerning for bowel microperforation or traumatic injury. No significant free air identified. Linear low attenuation along the inferior hepatic capsule adjacent to the gallbladder fossa. This was not clearly seen on the prior CTs  but may have been present. Although this may represent a fold or related to prior injury, if there is clinical concern for liver injury, an acute laceration is not entirely excluded. No extravasation of contrast or evidence of active hemorrhage. Small hiatal hernia.  No evidence of bowel obstruction. Small calcific  density in the gastric fundus may be an ingested foreign object. Correlation with clinical exam is recommended to evaluate for possibility of swallowed tooth. Stable appearing left adrenal lesions, most likely an adenoma. These results were called by telephone at the time of interpretation on 07/04/2016 at 5:47 am to Dr. Varney Biles , who verbally acknowledged these results. Electronically Signed   By: Anner Crete M.D.   On: 07/04/2016 05:53    Procedures Procedures (including critical care time)   CRITICAL CARE Performed by: Varney Biles   Total critical care time: 40 minutes  Critical care time was exclusive of separately billable procedures and treating other patients.  Critical care was necessary to treat or prevent imminent or life-threatening deterioration.  Critical care was time spent personally by me on the following activities: development of treatment plan with patient and/or surrogate as well as nursing, discussions with consultants, evaluation of patient's response to treatment, examination of patient, obtaining history from patient or surrogate, ordering and performing treatments and interventions, ordering and review of laboratory studies, ordering and review of radiographic studies, pulse oximetry and re-evaluation of patient's condition.   Medications Ordered in ED Medications  sodium chloride 0.9 % bolus 1,000 mL (not administered)    And  sodium chloride 0.9 % bolus 1,000 mL (not administered)  levofloxacin (LEVAQUIN) IVPB 750 mg (not administered)  magnesium sulfate IVPB 2 g 50 mL (not administered)  potassium chloride 10 mEq in 100 mL IVPB (not administered)  metroNIDAZOLE (FLAGYL) IVPB 500 mg (not administered)  HYDROmorphone (DILAUDID) injection 1 mg (1 mg Intravenous Given 07/04/16 0350)  promethazine (PHENERGAN) injection 12.5 mg (12.5 mg Intravenous Given 07/04/16 0350)  iopamidol (ISOVUE-300) 61 % injection 100 mL (100 mLs Intravenous Contrast Given  07/04/16 0438)     Initial Impression / Assessment and Plan / ED Course  I have reviewed the triage vital signs and the nursing notes.  Pertinent labs & imaging results that were available during my care of the patient were reviewed by me and considered in my medical decision making (see chart for details).  Clinical Course as of Jul 05 715  Fri Jul 04, 2016  0603 Results from the ER workup discussed with the patient face to face and all questions answered to the best of my ability. Given allergy to penicillin and cephalosporin, we will start levaquin and flagyl. WF informed. Code sepsis activated given elevated WC and RR. CT Abdomen Pelvis W Contrast [AN]  [id number] Spoke with Dr. Newman Pies, GI and Dr. Meda Coffee, EM - pt will [facility] ER. Pt made aware. The results from the CT with clinical concerns discussed. Pt on antibiotics.  [AN]    Clinical Course User Index [AN] Varney Biles, MD   Pt comes in with cc of abd pain. She is s/p EGD and clinical concerns are high for intra-abd injury from the EGD. CT ordered. Pain control for now.   Final Clinical Impressions(s) / ED Diagnoses   Final diagnoses:  Postoperative surgical complication involving digestive system associated with digestive system procedure, unspecified complication  Perforated bowel ([place])    New Prescriptions New Prescriptions   No medications on file     Janelis Stelzer,  MD 07/04/16 [id number]

## 2016-07-04 NOTE — Progress Notes (Signed)
ANTIBIOTIC CONSULT NOTE-Preliminary  Pharmacy Consult for Levofloxacin and Flagyl Indication: Intra-abdominal infection  Allergies  Allergen Reactions  . Morphine Anaphylaxis and Other (See Comments)    "it will kill me" "made me stop breathing"  . Aspirin Other (See Comments)    Gi bleed   . Cefuroxime Axetil Nausea And Vomiting  . Codeine Nausea And Vomiting  . Diltiazem Nausea And Vomiting  . Dronabinol Nausea And Vomiting and Palpitations    Patient also states that she had palpatations  . Shellfish Allergy Other (See Comments)    Blood sugar drops  . Ondansetron Hives  . Penicillins Rash    Patient Measurements: Height: 4\' 11"  (149.9 cm) Weight: 131 lb (59.4 kg) IBW/kg (Calculated) : 43.2   Vital Signs: Temp: 97.6 F (36.4 C) (01/26 0123) Temp Source: Oral (01/26 0123) BP: 150/63 (01/26 0123) Pulse Rate: 55 (01/26 0123)  Labs:  Recent Labs  07/04/16 0335 07/04/16 0424  WBC 13.8*  --   HGB 11.9* 12.9  PLT 211  --   CREATININE  --  0.60    Estimated Creatinine Clearance: 47.7 mL/min (by C-G formula based on SCr of 0.6 mg/dL).  No results for input(s): VANCOTROUGH, VANCOPEAK, VANCORANDOM, GENTTROUGH, GENTPEAK, GENTRANDOM, TOBRATROUGH, TOBRAPEAK, TOBRARND, AMIKACINPEAK, AMIKACINTROU, AMIKACIN in the last 72 hours.   Microbiology: No results found for this or any previous visit (from the past 720 hour(s)).  Medical History: Past Medical History:  Diagnosis Date  . Anemia   . Anxiety   . Arthritis    "all over" (04/19/2013)  . Bleeding stomach ulcer 1980's  . Bloating   . Chronic back pain   . Chronic headaches   . Chronic heartburn   . Chronic neck pain   . Chronic pain   . Colon cancer ([place])   . DDD (degenerative disc disease), lumbar   . Diabetic peripheral neuropathy ([place])    "in my feet" (04/19/2013)  . Dizziness   . Dysphagia   . Family history of anesthesia complication    "daughter has PONV too" (04/19/2013)  . Fibromyalgia   .  Gastroesophageal reflux   . Gastroparesis   . Glaucoma, bilateral   . Hiatal hernia    "had it before; had OR; got it again" (04/19/2013)  . History of blood transfusion 1980's   "w/bleeding stomach ulcer" (04/19/2013)  . Hypertension    "for diabetes; don't have high blood pressure" (04/19/2013)  . Migraine    "take RX for it qd" (04/19/2013)  . Nausea   . Pneumonia 04/2012  . PONV (postoperative nausea and vomiting)   . Renal insufficiency   . TIA (transient ischemic attack)    "3-4 before starting RX; none since" (04/19/2013)  . Type II diabetes mellitus ([place])   . Walking pneumonia ~ 1966    Medications:  Levofloxacin 750 mg IV x 1 dose given in the ED  Assessment: 76 yo female s/p EGD 07/03/16 with abdominal pain since early afternoon. Patient advised to come to the ED for evaluation of possible perforation  Goal of Therapy: Eradicate infecction   Plan:  Preliminary review of pertinent patient information completed.  Protocol will be initiated with a one-time dose of metronidazole 500 mg IV.  [name] clinical pharmacist will complete review during morning rounds to assess patient and finalize treatment regimen.  Norberto Sorenson, [facility] 07/04/2016,6:35 AM

## 2016-07-04 NOTE — ED Notes (Signed)
Pt is very drowsy from Phenergan administration, but is stating her pain is unchanged.  Family at bedside.

## 2016-07-05 LAB — URINE CULTURE

## 2016-07-06 DIAGNOSIS — R109 Unspecified abdominal pain: Secondary | ICD-10-CM | POA: Insufficient documentation

## 2016-07-09 ENCOUNTER — Ambulatory Visit: Payer: Medicare Other

## 2016-07-09 ENCOUNTER — Ambulatory Visit: Payer: Medicare Other | Admitting: Physical Therapy

## 2016-07-09 LAB — CULTURE, BLOOD (ROUTINE X 2)
Culture: NO GROWTH
Culture: NO GROWTH

## 2016-07-28 ENCOUNTER — Encounter: Payer: Medicare Other | Attending: Physical Medicine & Rehabilitation | Admitting: Physical Medicine & Rehabilitation

## 2016-07-28 ENCOUNTER — Other Ambulatory Visit (INDEPENDENT_AMBULATORY_CARE_PROVIDER_SITE_OTHER): Payer: Self-pay | Admitting: *Deleted

## 2016-07-28 DIAGNOSIS — N289 Disorder of kidney and ureter, unspecified: Secondary | ICD-10-CM | POA: Insufficient documentation

## 2016-07-28 DIAGNOSIS — E1143 Type 2 diabetes mellitus with diabetic autonomic (poly)neuropathy: Secondary | ICD-10-CM

## 2016-07-28 DIAGNOSIS — Z9181 History of falling: Secondary | ICD-10-CM | POA: Insufficient documentation

## 2016-07-28 DIAGNOSIS — F0781 Postconcussional syndrome: Secondary | ICD-10-CM | POA: Insufficient documentation

## 2016-07-28 DIAGNOSIS — K3184 Gastroparesis: Principal | ICD-10-CM

## 2016-07-28 DIAGNOSIS — M797 Fibromyalgia: Secondary | ICD-10-CM | POA: Insufficient documentation

## 2016-07-28 DIAGNOSIS — H8110 Benign paroxysmal vertigo, unspecified ear: Secondary | ICD-10-CM | POA: Insufficient documentation

## 2016-07-28 DIAGNOSIS — Z87891 Personal history of nicotine dependence: Secondary | ICD-10-CM | POA: Insufficient documentation

## 2016-07-28 DIAGNOSIS — R51 Headache: Secondary | ICD-10-CM | POA: Insufficient documentation

## 2016-07-28 DIAGNOSIS — F419 Anxiety disorder, unspecified: Secondary | ICD-10-CM | POA: Insufficient documentation

## 2016-07-28 DIAGNOSIS — Z8051 Family history of malignant neoplasm of kidney: Secondary | ICD-10-CM | POA: Insufficient documentation

## 2016-07-28 DIAGNOSIS — Z833 Family history of diabetes mellitus: Secondary | ICD-10-CM | POA: Insufficient documentation

## 2016-07-28 DIAGNOSIS — Z8673 Personal history of transient ischemic attack (TIA), and cerebral infarction without residual deficits: Secondary | ICD-10-CM | POA: Insufficient documentation

## 2016-07-28 DIAGNOSIS — R413 Other amnesia: Secondary | ICD-10-CM | POA: Insufficient documentation

## 2016-07-28 DIAGNOSIS — M5136 Other intervertebral disc degeneration, lumbar region: Secondary | ICD-10-CM | POA: Insufficient documentation

## 2016-07-28 DIAGNOSIS — K219 Gastro-esophageal reflux disease without esophagitis: Secondary | ICD-10-CM | POA: Insufficient documentation

## 2016-07-28 DIAGNOSIS — Z85038 Personal history of other malignant neoplasm of large intestine: Secondary | ICD-10-CM | POA: Insufficient documentation

## 2016-07-28 DIAGNOSIS — E1142 Type 2 diabetes mellitus with diabetic polyneuropathy: Secondary | ICD-10-CM | POA: Insufficient documentation

## 2016-07-28 DIAGNOSIS — K220 Achalasia of cardia: Secondary | ICD-10-CM

## 2016-07-28 DIAGNOSIS — Z8249 Family history of ischemic heart disease and other diseases of the circulatory system: Secondary | ICD-10-CM | POA: Insufficient documentation

## 2016-07-28 DIAGNOSIS — G8929 Other chronic pain: Secondary | ICD-10-CM | POA: Insufficient documentation

## 2016-07-28 DIAGNOSIS — Z8711 Personal history of peptic ulcer disease: Secondary | ICD-10-CM | POA: Insufficient documentation

## 2016-07-28 DIAGNOSIS — Z981 Arthrodesis status: Secondary | ICD-10-CM | POA: Insufficient documentation

## 2016-07-30 LAB — CBC
HCT: 28.3 % — ABNORMAL LOW (ref 35.0–45.0)
Hemoglobin: 8.8 g/dL — ABNORMAL LOW (ref 11.7–15.5)
MCH: 28.9 pg (ref 27.0–33.0)
MCHC: 31.1 g/dL — ABNORMAL LOW (ref 32.0–36.0)
MCV: 93.1 fL (ref 80.0–100.0)
MPV: 10.1 fL (ref 7.5–12.5)
Platelets: 386 10*3/uL (ref 140–400)
RBC: 3.04 MIL/uL — ABNORMAL LOW (ref 3.80–5.10)
RDW: 13.8 % (ref 11.0–15.0)
WBC: 6.1 10*3/uL (ref 3.8–10.8)

## 2016-07-31 ENCOUNTER — Other Ambulatory Visit (INDEPENDENT_AMBULATORY_CARE_PROVIDER_SITE_OTHER): Payer: Self-pay | Admitting: *Deleted

## 2016-07-31 DIAGNOSIS — D649 Anemia, unspecified: Secondary | ICD-10-CM

## 2016-08-04 ENCOUNTER — Encounter (INDEPENDENT_AMBULATORY_CARE_PROVIDER_SITE_OTHER): Payer: Self-pay | Admitting: *Deleted

## 2016-08-04 ENCOUNTER — Other Ambulatory Visit (INDEPENDENT_AMBULATORY_CARE_PROVIDER_SITE_OTHER): Payer: Self-pay | Admitting: *Deleted

## 2016-08-04 DIAGNOSIS — D649 Anemia, unspecified: Secondary | ICD-10-CM

## 2016-08-05 ENCOUNTER — Encounter (INDEPENDENT_AMBULATORY_CARE_PROVIDER_SITE_OTHER): Payer: Self-pay

## 2016-08-21 ENCOUNTER — Ambulatory Visit: Payer: Medicare Other | Attending: Physical Medicine & Rehabilitation

## 2016-08-21 DIAGNOSIS — H8113 Benign paroxysmal vertigo, bilateral: Secondary | ICD-10-CM | POA: Diagnosis present

## 2016-08-21 DIAGNOSIS — R2689 Other abnormalities of gait and mobility: Secondary | ICD-10-CM | POA: Diagnosis not present

## 2016-08-21 DIAGNOSIS — R42 Dizziness and giddiness: Secondary | ICD-10-CM | POA: Diagnosis present

## 2016-08-21 DIAGNOSIS — G4489 Other headache syndrome: Secondary | ICD-10-CM | POA: Insufficient documentation
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
## 2016-08-27 LAB — HEMOGLOBIN AND HEMATOCRIT, BLOOD
HCT: 34.1 % — ABNORMAL LOW (ref 35.0–45.0)
Hemoglobin: 10.7 g/dL — ABNORMAL LOW (ref 11.7–15.5)

## 2016-09-01 ENCOUNTER — Other Ambulatory Visit (INDEPENDENT_AMBULATORY_CARE_PROVIDER_SITE_OTHER): Payer: Self-pay | Admitting: *Deleted

## 2016-09-01 ENCOUNTER — Encounter (INDEPENDENT_AMBULATORY_CARE_PROVIDER_SITE_OTHER): Payer: Self-pay | Admitting: *Deleted

## 2016-09-01 DIAGNOSIS — E1143 Type 2 diabetes mellitus with diabetic autonomic (poly)neuropathy: Secondary | ICD-10-CM

## 2016-09-01 DIAGNOSIS — K3184 Gastroparesis: Secondary | ICD-10-CM

## 2016-09-01 DIAGNOSIS — K220 Achalasia of cardia: Secondary | ICD-10-CM

## 2016-09-01 DIAGNOSIS — D649 Anemia, unspecified: Secondary | ICD-10-CM

## 2016-09-03 ENCOUNTER — Ambulatory Visit: Payer: Medicare Other

## 2016-09-03 DIAGNOSIS — R2689 Other abnormalities of gait and mobility: Secondary | ICD-10-CM | POA: Diagnosis not present

## 2016-09-03 DIAGNOSIS — H8113 Benign paroxysmal vertigo, bilateral: Secondary | ICD-10-CM

## 2016-09-03 DIAGNOSIS — R42 Dizziness and giddiness: Secondary | ICD-10-CM

## 2016-09-03 NOTE — Therapy (Signed)
[facility] [address] [place], [place], [address] Phone: [phone]   Fax:  [phone]  Physical Therapy Treatment  Patient Details  Name: [name] MRN: [id number] Date of Birth: May 07, 1941 Referring Provider: Dr. Naaman Plummer  Encounter Date: 09/03/2016      PT End of Session - 09/03/16 1207    Visit Number 2   Number of Visits 17   Date for PT Re-Evaluation 10/20/16   Authorization Type G-code and progress note every 10th visit.    PT Start Time 1019   PT Stop Time 1101   PT Time Calculation (min) 42 min   Activity Tolerance Other (comment)  limited 2/2 nausea and dizziness   Behavior During Therapy WFL for tasks assessed/performed      Past Medical History:  Diagnosis Date  . Anemia   . Anxiety   . Arthritis    "all over" (04/19/2013)  . Bleeding stomach ulcer 1980's  . Bloating   . Chronic back pain   . Chronic headaches   . Chronic heartburn   . Chronic neck pain   . Chronic pain   . Colon cancer ([place])   . DDD (degenerative disc disease), lumbar   . Diabetic peripheral neuropathy ([place])    "in my feet" (04/19/2013)  . Dizziness   . Dysphagia   . Family history of anesthesia complication    "daughter has PONV too" (04/19/2013)  . Fibromyalgia   . Gastroesophageal reflux   . Gastroparesis   . Glaucoma, bilateral   . Hiatal hernia    "had it before; had OR; got it again" (04/19/2013)  . History of blood transfusion 1980's   "w/bleeding stomach ulcer" (04/19/2013)  . Hypertension    "for diabetes; don't have high blood pressure" (04/19/2013)  . Migraine    "take RX for it qd" (04/19/2013)  . Nausea   . Pneumonia 04/2012  . PONV (postoperative nausea and vomiting)   . Renal insufficiency   . TIA (transient ischemic attack)    "3-4 before starting RX; none since" (04/19/2013)  . Type II diabetes mellitus ([place])   . Walking pneumonia ~ 1966    Past Surgical History:  Procedure  Laterality Date  . ABDOMINAL ADHESION SURGERY  ~ 2012   "repaired wrap where they did hiatal hernia OR too" 911/04/2013)  . ANTERIOR CERVICAL DISCECTOMY  ~ 2009   "only cleaned out arthritis and spurs" (04/19/2013)  . CATARACT EXTRACTION W/ INTRAOCULAR LENS IMPLANT Left 04/13/2013  . CHOLECYSTECTOMY  1980's  . COLON SURGERY    . COLONOSCOPY  10/11/2010  . COLONOSCOPY  11/23/2009  . COLONOSCOPY  07/28/2008   W/SNARE  . COLONOSCOPY  06/28/07  . COLONOSCOPY  05/10/07   W/POLYP  . COLONOSCOPY  12/28/00  . COLONOSCOPY N/A 06/29/2015   Procedure: COLONOSCOPY;  Surgeon: Rogene Houston, MD;  Location: [facility];  Service: Endoscopy;  Laterality: N/A;  135  . COLONOSCOPY WITH ESOPHAGOGASTRODUODENOSCOPY (EGD) N/A 05/13/2013   Procedure: COLONOSCOPY WITH ESOPHAGOGASTRODUODENOSCOPY (EGD);  Surgeon: Rogene Houston, MD;  Location: [facility];  Service: Endoscopy;  Laterality: N/A;  855  . HEMICOLECTOMY  2010   [facility]IE[facility]  . HIATAL HERNIA REPAIR  1970's  . LEFT HEART CATHETERIZATION WITH CORONARY ANGIOGRAM N/A 04/19/2013   Procedure: LEFT HEART CATHETERIZATION WITH CORONARY ANGIOGRAM;  Surgeon: Peter M Martinique, MD;  Location: [facility] CATH LAB;  Service: Cardiovascular;  Laterality: N/A;  . [place]   "had a  broken neck" (04/19/2013)  . TONSILLECTOMY  ~ 1953  . UPPER GASTROINTESTINAL ENDOSCOPY  10/11/2010   EGD ED  . UPPER GASTROINTESTINAL ENDOSCOPY  11/23/2009  . UPPER GASTROINTESTINAL ENDOSCOPY  05/10/07   EGD ED  . UPPER GASTROINTESTINAL ENDOSCOPY  08/11/01   EGD ED  . VAGINAL HYSTERECTOMY      There were no vitals filed for this visit.      Subjective Assessment - 09/03/16 1025    Subjective Pt reported she feels dizzy/numb when she lies on her R side of her head (where skull was fractured).    Patient is accompained by: Family member  Sue: sister and dtr: [name]   Pertinent History HOH, fibromyalgia, DM with neuropathy in feet, hx of Colon CA 5 years ago,  anxiety, TIA, HTN, kidney disease, DDD, chronic neck and back pain, spondylolysis and spondylolithesis, glaucoma, diabetic gastroparesis, unstable angina, spinal stenosis   Patient Stated Goals Stop stumbling around and move without falling   Currently in Pain? Yes   Pain Score 5    Pain Location Head   Pain Orientation Right;Left   Pain Type Chronic pain   Pain Radiating Towards neck/shoulders  pt reports she broke her neck years ago and the neck pain might be from that   Pain Onset More than a month ago   Pain Frequency Constant   Aggravating Factors  stress   Pain Relieving Factors medication                Vestibular Assessment - 09/03/16 1027      Vestibular Assessment   General Observation Pt reports spinning sensation at first and now dizziness is "wavy" and wooziness.     Symptom Behavior   Type of Dizziness Spinning  "It waves"   Frequency of Dizziness Every day   Duration of Dizziness Seconds   Aggravating Factors Sit to stand  Lying on R side   Relieving Factors Rest  just being still     Occulomotor Exam   Occulomotor Alignment Normal   Spontaneous Absent   Gaze-induced Absent   Smooth Pursuits Intact   Saccades Intact   Comment No reported dizziness. B HIT (-), however, pt's A/PROM limited and guarded.     Vestibulo-Occular Reflex   VOR 1 Head Only (x 1 viewing) Decr. speed and 2/10 dizziness reported.   VOR Cancellation Normal     Visual Acuity   Static Line 8   Dynamic Line 2     Positional Testing   Dix-Hallpike Dix-Hallpike Right;Dix-Hallpike Left   Horizontal Canal Testing Horizontal Canal Right;Horizontal Canal Left     Dix-Hallpike Right   Dix-Hallpike Right Duration <15 sec. nystagmus and 8-9/10 dizziness   Dix-Hallpike Right Symptoms Upbeat, right rotatory nystagmus     Dix-Hallpike Left   Dix-Hallpike Left Duration <10 sec. 1-2/10 dizziness and severe nausea   Dix-Hallpike Left Symptoms Upbeat, left rotatory nystagmus  incr.  amplitude in nystagmus vs. R side     Horizontal Canal Right   Horizontal Canal Right Duration none   Horizontal Canal Right Symptoms Normal     Horizontal Canal Left   Horizontal Canal Left Duration none   Horizontal Canal Left Symptoms Normal                  Vestibular Treatment/Exercise - 09/03/16 1054      Vestibular Treatment/Exercise   Vestibular Treatment Provided Canalith Repositioning   Canalith Repositioning Epley Manuever Right      EPLEY MANUEVER RIGHT  Number of Reps  1   Overall Response Improved Symptoms   Response Details  Decr. nystagmus but reported nausea afterwards.               PT Education - 09/03/16 1206    Education provided Yes   Education Details PT discussed exam findings and BPPV. PT reiterated the importance of pt having a driver to each session 2/2 dizziness/nausea.   Person(s) Educated Patient;Child(ren);Other (comment)  sister: Collie Siad and dtr: [name]   Methods Explanation   Comprehension Verbalized understanding          PT Short Term Goals - 08/21/16 1529      PT SHORT TERM GOAL #1   Title Pt will be IND In HEP to improve balance, dizziness, and strength. TARGET DATE FOR ALL STGS: 09/18/16   Status New     PT SHORT TERM GOAL #2   Title Pt will improve DGI score to >/=17/24 to decr. falls risk.    Status New     PT SHORT TERM GOAL #3   Title Pt will improve gait speed to >/=3.7ft/sec., with SPC, to safely amb in the community.    Status New     PT SHORT TERM GOAL #4   Title Pt will amb. 500' over even terrain, while performing head turns, with SPC at MOD I level and no incr. in dizziness to improve functional mobility.    Status New     PT SHORT TERM GOAL #5   Title Perform vestibular assessment and write goals as indicated.    Status New           PT Long Term Goals - 08/21/16 1533      PT LONG TERM GOAL #1   Title Pt will verbalize understanding of fall risk prevention strategies to decr. falls risk.  TARGET DATE FOR ALL LTGS: 10/16/16   Status New     PT LONG TERM GOAL #2   Title Pt will improve DGI score to >/=20/24 to decr. falls risk.    Status New     PT LONG TERM GOAL #3   Title Pt will amb. 700' outdoors, over even and uneven surfaces, with SPC at MOD I level, while performing head turns to improve functional mobility.    Status New     PT LONG TERM GOAL #4   Title Pt will report HA pain is </=7/10 at worst in order to improve quality of life.    Status New     PT LONG TERM GOAL #5   Title Write FOTO goal as indicated.    Status New               Plan - 09/03/16 1208    Clinical Impression Statement PT performed vestibular assessment today. Positional testing symptoms consistent with B pBPPV canalithiasis, due to upbeating rotary nystagmus and dizziness/nausea. Pt also experienced concordant dizziness during VOR. Pt reported decr. symptoms after R Epley's treatment. Pt reported increased severity of dizziness during R Dix-Hallpike but nystagmus amplitude was larger during L Dix-Hallpike. PT will reassess for B pBPPV next session and treat based on findings. Continue with POC.    Rehab Potential Good   Clinical Impairments Affecting Rehab Potential HOH, fibromyalgia, DM with neuropathy in feet, hx of Colon CA 5 years ago, anxiety, TIA, HTN, kidney disease, DDD, chronic neck and back pain, spondylolysis and spondylolithesis, glaucoma, diabetic gastroparesis, unstable angina, spinal stenosis   PT Frequency 2x / week   PT Duration  8 weeks   PT Treatment/Interventions ADLs/Self Care Home Management;Biofeedback;Canalith Repostioning;Gait training;Stair training;Functional mobility training;Therapeutic activities;Therapeutic exercise;Balance training;Patient/family education;Cognitive remediation;Neuromuscular re-education;Manual techniques;Vestibular   PT Next Visit Plan Reassess for B pBPPV and initiate balance/dizziness HEP as indicated and tolerated. Provide pt with falls  risk ed handout.   Consulted and Agree with Plan of Care Patient;Family member/caregiver   Family Member Consulted dtrs: [name] and [name]      Patient will benefit from skilled therapeutic intervention in order to improve the following deficits and impairments:  Abnormal gait, Decreased cognition, Dizziness, Impaired sensation, Decreased balance, Decreased strength, Decreased mobility, Pain  Visit Diagnosis: Dizziness and giddiness - Plan: PT plan of care cert/re-cert  Other abnormalities of gait and mobility - Plan: PT plan of care cert/re-cert  BPPV (benign paroxysmal positional vertigo), bilateral - Plan: PT plan of care cert/re-cert     Problem List Patient Active Problem List   Diagnosis Date Noted  . Post concussion syndrome 07/02/2016  . Skull fracture ([place]) 02/03/2016  . Occipital fracture ([place]) 02/02/2016  . Lactic acidosis   . Ureteral calculus, right   . Nephrolithiasis 12/05/2014  . Elevated lactic acid level 12/05/2014  . Unstable angina ([place]) 04/19/2013  . History of GI bleed 04/19/2013  . Fibromyalgia 04/19/2013  . GERD (gastroesophageal reflux disease) 04/19/2013  . Coronary artery calcification seen on CAT scan 04/19/2013  . Bradycardia 04/19/2013  . History of anemia 12/02/2011  . History of colonic polyps 08/04/2011  . Diabetic gastroparesis ([place]) 08/04/2011  . Achalasia 08/04/2011  . Allergic rhinitis, cause unspecified 08/11/2010  . SPINAL STENOSIS 02/07/2008  . SPONDYLOLYSIS 02/07/2008  . 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 [place], [place], [address] Phone: [phone]   Fax:  [phone]  Name: [name] MRN: [id number] Date of Birth: Jun 26, 1940  Geoffry Paradise, PT,DPT 09/03/16 12:15 PM Phone: [phone] Fax: [phone]

## 2016-09-10 ENCOUNTER — Ambulatory Visit: Payer: Medicare Other | Attending: Physical Medicine & Rehabilitation | Admitting: Physical Therapy

## 2016-09-10 ENCOUNTER — Encounter: Payer: Self-pay | Admitting: Physical Therapy

## 2016-09-10 DIAGNOSIS — H8113 Benign paroxysmal vertigo, bilateral: Secondary | ICD-10-CM | POA: Diagnosis present

## 2016-09-10 DIAGNOSIS — R42 Dizziness and giddiness: Secondary | ICD-10-CM | POA: Insufficient documentation

## 2016-09-10 DIAGNOSIS — R2689 Other abnormalities of gait and mobility: Secondary | ICD-10-CM | POA: Diagnosis present

## 2016-09-10 NOTE — Patient Instructions (Addendum)
Single Leg - Eyes Open    Holding support, lift right leg while maintaining balance over other leg. Progress to removing hands from support surface for longer periods of time. Hold_10___ seconds. Repeat __5__ times per session. Do __1__ sessions per day.  Copyright  VHI. All rights reserved.     Fall Prevention in the Home Falls can cause injuries. They can happen to people of all ages. There are many things you can do to make your home safe and to help prevent falls. What can I do on the outside of my home?  Regularly fix the edges of walkways and driveways and fix any cracks.  Remove anything that might make you trip as you walk through a door, such as a raised step or threshold.  Trim any bushes or trees on the path to your home.  Use bright outdoor lighting.  Clear any walking paths of anything that might make someone trip, such as rocks or tools.  Regularly check to see if handrails are loose or broken. Make sure that both sides of any steps have handrails.  Any raised decks and porches should have guardrails on the edges.  Have any leaves, snow, or ice cleared regularly.  Use sand or salt on walking paths during winter.  Clean up any spills in your garage right away. This includes oil or grease spills. What can I do in the bathroom?  Use night lights.  Install grab bars by the toilet and in the tub and shower. Do not use towel bars as grab bars.  Use non-skid mats or decals in the tub or shower.  If you need to sit down in the shower, use a plastic, non-slip stool.  Keep the floor dry. Clean up any water that spills on the floor as soon as it happens.  Remove soap buildup in the tub or shower regularly.  Attach bath mats securely with double-sided non-slip rug tape.  Do not have throw rugs and other things on the floor that can make you trip. What can I do in the bedroom?  Use night lights.  Make sure that you have a light by your bed that is easy to  reach.  Do not use any sheets or blankets that are too big for your bed. They should not hang down onto the floor.  Have a firm chair that has side arms. You can use this for support while you get dressed.  Do not have throw rugs and other things on the floor that can make you trip. What can I do in the kitchen?  Clean up any spills right away.  Avoid walking on wet floors.  Keep items that you use a lot in easy-to-reach places.  If you need to reach something above you, use a strong step stool that has a grab bar.  Keep electrical cords out of the way.  Do not use floor polish or wax that makes floors slippery. If you must use wax, use non-skid floor wax.  Do not have throw rugs and other things on the floor that can make you trip. What can I do with my stairs?  Do not leave any items on the stairs.  Make sure that there are handrails on both sides of the stairs and use them. Fix handrails that are broken or loose. Make sure that handrails are as long as the stairways.  Check any carpeting to make sure that it is firmly attached to the stairs. Fix any carpet that is  loose or worn.  Avoid having throw rugs at the top or bottom of the stairs. If you do have throw rugs, attach them to the floor with carpet tape.  Make sure that you have a light switch at the top of the stairs and the bottom of the stairs. If you do not have them, ask someone to add them for you. What else can I do to help prevent falls?  Wear shoes that:  Do not have high heels.  Have rubber bottoms.  Are comfortable and fit you well.  Are closed at the toe. Do not wear sandals.  If you use a stepladder:  Make sure that it is fully opened. Do not climb a closed stepladder.  Make sure that both sides of the stepladder are locked into place.  Ask someone to hold it for you, if possible.  Clearly mark and make sure that you can see:  Any grab bars or handrails.  First and last steps.  Where the  edge of each step is.  Use tools that help you move around (mobility aids) if they are needed. These include:  Canes.  Walkers.  Scooters.  Crutches.  Turn on the lights when you go into a dark area. Replace any light bulbs as soon as they burn out.  Set up your furniture so you have a clear path. Avoid moving your furniture around.  If any of your floors are uneven, fix them.  If there are any pets around you, be aware of where they are.  Review your medicines with your doctor. Some medicines can make you feel dizzy. This can increase your chance of falling. Ask your doctor what other things that you can do to help prevent falls. This information is not intended to replace advice given to you by your health care provider. Make sure you discuss any questions you have with your health care provider. Document Released: 03/22/2009 Document Revised: 11/01/2015 Document Reviewed: 06/30/2014 Elsevier Interactive Patient Education  2017 [employer].

## 2016-09-10 NOTE — Therapy (Signed)
[facility] [address] [place], [place], [address] Phone: [phone]   Fax:  [phone]  Physical Therapy Treatment  Patient Details  Name: [name] MRN: [id number] Date of Birth: [birth year] Referring Provider: Dr. Naaman Plummer  Encounter Date: 09/10/2016      PT End of Session - 09/10/16 1121    Visit Number 3   Number of Visits 17   Date for PT Re-Evaluation 10/20/16   Authorization Type G-code and progress note every 10th visit.    Equipment Utilized During Treatment Gait belt   Activity Tolerance Patient tolerated treatment well   Behavior During Therapy WFL for tasks assessed/performed      Past Medical History:  Diagnosis Date  . Anemia   . Anxiety   . Arthritis    "all over" (04/19/2013)  . Bleeding stomach ulcer 1980's  . Bloating   . Chronic back pain   . Chronic headaches   . Chronic heartburn   . Chronic neck pain   . Chronic pain   . Colon cancer ([place])   . DDD (degenerative disc disease), lumbar   . Diabetic peripheral neuropathy ([place])    "in my feet" (04/19/2013)  . Dizziness   . Dysphagia   . Family history of anesthesia complication    "daughter has PONV too" (04/19/2013)  . Fibromyalgia   . Gastroesophageal reflux   . Gastroparesis   . Glaucoma, bilateral   . Hiatal hernia    "had it before; had OR; got it again" (04/19/2013)  . History of blood transfusion 1980's   "w/bleeding stomach ulcer" (04/19/2013)  . Hypertension    "for diabetes; don't have high blood pressure" (04/19/2013)  . Migraine    "take RX for it qd" (04/19/2013)  . Nausea   . Pneumonia 04/2012  . PONV (postoperative nausea and vomiting)   . Renal insufficiency   . TIA (transient ischemic attack)    "3-4 before starting RX; none since" (04/19/2013)  . Type II diabetes mellitus ([place])   . Walking pneumonia ~ 1966    Past Surgical History:  Procedure Laterality Date  . ABDOMINAL ADHESION SURGERY  ~  2012   "repaired wrap where they did hiatal hernia OR too" 911/04/2013)  . ANTERIOR CERVICAL DISCECTOMY  ~ 2009   "only cleaned out arthritis and spurs" (04/19/2013)  . CATARACT EXTRACTION W/ INTRAOCULAR LENS IMPLANT Left 04/13/2013  . CHOLECYSTECTOMY  1980's  . COLON SURGERY    . COLONOSCOPY  10/11/2010  . COLONOSCOPY  11/23/2009  . COLONOSCOPY  07/28/2008   W/SNARE  . COLONOSCOPY  06/28/07  . COLONOSCOPY  05/10/07   W/POLYP  . COLONOSCOPY  12/28/00  . COLONOSCOPY N/A 06/29/2015   Procedure: COLONOSCOPY;  Surgeon: Rogene Houston, MD;  [facility];  Service: Endoscopy;  Laterality: N/A;  135  . COLONOSCOPY WITH ESOPHAGOGASTRODUODENOSCOPY (EGD) N/A 05/13/2013   Procedure: COLONOSCOPY WITH ESOPHAGOGASTRODUODENOSCOPY (EGD);  Surgeon: Rogene Houston, MD;  [facility];  Service: Endoscopy;  Laterality: N/A;  855  . HEMICOLECTOMY  2010   ZIEGLER  . HIATAL HERNIA REPAIR  1970's  . LEFT HEART CATHETERIZATION WITH CORONARY ANGIOGRAM N/A 04/19/2013   Procedure: LEFT HEART CATHETERIZATION WITH CORONARY ANGIOGRAM;  Surgeon: Peter M Martinique, MD;  Location: [facility] CATH LAB;  Service: Cardiovascular;  Laterality: N/A;  . [place]   "had a broken neck" (04/19/2013)  . TONSILLECTOMY  ~ 1953  . UPPER GASTROINTESTINAL ENDOSCOPY  10/11/2010  EGD ED  . UPPER GASTROINTESTINAL ENDOSCOPY  11/23/2009  . UPPER GASTROINTESTINAL ENDOSCOPY  05/10/07   EGD ED  . UPPER GASTROINTESTINAL ENDOSCOPY  08/11/01   EGD ED  . VAGINAL HYSTERECTOMY      There were no vitals filed for this visit.      Subjective Assessment - 09/10/16 1028    Subjective Reports no more spinning but does feel imbalance at times. Usually backwards but sometimes sideways.    Patient is accompained by: Family member   Currently in Pain? No/denies                Vestibular Assessment - 09/10/16 0001      Dix-Hallpike Right   Dix-Hallpike Right Duration 0  assessed after Rt  BPPV treatment last visit     Dix-Hallpike Left   Dix-Hallpike Left Duration <10 sec. 4/10 dizziness; denied nausea   Dix-Hallpike Left Symptoms Upbeat, left rotatory nystagmus                 [facility] Adult PT Treatment/Exercise - 09/10/16 0001      Transfers   Sit to Stand 5: Supervision;With upper extremity assist;From chair/3-in-1   Stand to Sit 5: Supervision;With upper extremity assist;To chair/3-in-1     Ambulation/Gait   Ambulation/Gait Assistance 5: Supervision;4: Min guard   Ambulation/Gait Assistance Details with and without SPC; slight drift to left and slight stagger x 2; with cane no difficulties   Ambulation Distance (Feet) 240 Feet   Assistive device Straight cane;None   Gait Pattern Step-through pattern;Decreased stride length   Ambulation Surface Level;Indoor         Vestibular Treatment/Exercise - 09/10/16 0001      Vestibular Treatment/Exercise   Vestibular Treatment Provided Canalith Repositioning   Canalith Repositioning Epley Manuever Left      EPLEY MANUEVER LEFT   Number of Reps  2   Overall Response  Symptoms Resolved    RESPONSE DETAILS LEFT Second repetition to ensure BPPV cleared; 1st 2 positions with no symptoms and not completed            Balance Exercises - 09/10/16 1118      Balance Exercises: Standing   Standing Eyes Opened Narrow base of support (BOS);Head turns;Solid surface  10 reps head turns; slight imbalance but recovered   Standing Eyes Closed Narrow base of support (BOS);Solid surface;2 reps  3 sec; 10 sec; reports feels very unsteady with eyes closed   Tandem Stance Eyes open;1 rep;10 secs  staggered step--width 2"; bil LEs   SLS Eyes open;Upper extremity support 2;1 rep;10 secs  bil LEs           PT Education - 09/10/16 1108    Education provided Yes   Education Details results of Lt Hallpike and treatment Lt Epley; HEP of SLS; fall prevention    Person(s) Educated Patient;Child(ren)   Methods  Explanation;Demonstration;Verbal cues;Handout   Comprehension Verbalized understanding;Returned demonstration          PT Short Term Goals - 08/21/16 1529      PT SHORT TERM GOAL #1   Title Pt will be IND In HEP to improve balance, dizziness, and strength. TARGET DATE FOR ALL STGS: 09/18/16   Status New     PT SHORT TERM GOAL #2   Title Pt will improve DGI score to >/=17/24 to decr. falls risk.    Status New     PT SHORT TERM GOAL #3   Title Pt will improve gait speed to >/=3.82ft/sec., with  SPC, to safely amb in the community.    Status New     PT SHORT TERM GOAL #4   Title Pt will amb. 500' over even terrain, while performing head turns, with SPC at MOD I level and no incr. in dizziness to improve functional mobility.    Status New     PT SHORT TERM GOAL #5   Title Perform vestibular assessment and write goals as indicated.    Status New           PT Long Term Goals - 08/21/16 1533      PT LONG TERM GOAL #1   Title Pt will verbalize understanding of fall risk prevention strategies to decr. falls risk. TARGET DATE FOR ALL LTGS: 10/16/16   Status New     PT LONG TERM GOAL #2   Title Pt will improve DGI score to >/=20/24 to decr. falls risk.    Status New     PT LONG TERM GOAL #3   Title Pt will amb. 700' outdoors, over even and uneven surfaces, with SPC at MOD I level, while performing head turns to improve functional mobility.    Status New     PT LONG TERM GOAL #4   Title Pt will report HA pain is </=7/10 at worst in order to improve quality of life.    Status New     PT LONG TERM GOAL #5   Title Write FOTO goal as indicated.    Status New               Plan - 09/10/16 1122    Clinical Impression Statement REassessed for BPPV as pt reporting continued "off-balance" symptoms with significant losses of balance. Rt Hallpike-Dix negative; Lt positive for posterior canal BPPV. Treated with Lt Epley maneuver with excellent results. Pt denied off-balance  symptoms throughout remainder of session. Focused on balance training and fall prevention education. Patient/daughter report that she had HHPT after d/c from hospital and they did lots of fall prevention education. Will continue to follow for at least one more session to assess effectiveness of cannalith repositioning and re-assess pt's balance without BPPV.    Rehab Potential Good   Clinical Impairments Affecting Rehab Potential HOH, fibromyalgia, DM with neuropathy in feet, hx of Colon CA 5 years ago, anxiety, TIA, HTN, kidney disease, DDD, chronic neck and back pain, spondylolysis and spondylolithesis, glaucoma, diabetic gastroparesis, unstable angina, spinal stenosis   PT Frequency 2x / week   PT Duration 8 weeks   PT Treatment/Interventions ADLs/Self Care Home Management;Biofeedback;Canalith Repostioning;Gait training;Stair training;Functional mobility training;Therapeutic activities;Therapeutic exercise;Balance training;Patient/family education;Cognitive remediation;Neuromuscular re-education;Manual techniques;Vestibular   PT Next Visit Plan Reassess for B pBPPV, if indicated; repeat DGI and gait velocity now BPPV resolved (pt is eager to d/c PT and would be a good way to convince her if we feel she still needs therapy; add to balance/dizziness HEP as indicated and tolerated.    PT Home Exercise Plan SLS   Consulted and Agree with Plan of Care Patient;Family member/caregiver   Family Member Consulted dtrs: [name] and [name]      Patient will benefit from skilled therapeutic intervention in order to improve the following deficits and impairments:  Abnormal gait, Decreased cognition, Dizziness, Impaired sensation, Decreased balance, Decreased strength, Decreased mobility, Pain  Visit Diagnosis: Dizziness and giddiness  Other abnormalities of gait and mobility  BPPV (benign paroxysmal positional vertigo), bilateral     Problem List Patient Active Problem List   Diagnosis Date Noted  .  Post concussion syndrome 07/02/2016  . Skull fracture ([place]) 02/03/2016  . Occipital fracture ([place]) 02/02/2016  . Lactic acidosis   . Ureteral calculus, right   . Nephrolithiasis 12/05/2014  . Elevated lactic acid level 12/05/2014  . Unstable angina ([place]) 04/19/2013  . History of GI bleed 04/19/2013  . Fibromyalgia 04/19/2013  . GERD (gastroesophageal reflux disease) 04/19/2013  . Coronary artery calcification seen on CAT scan 04/19/2013  . Bradycardia 04/19/2013  . History of anemia 12/02/2011  . History of colonic polyps 08/04/2011  . Diabetic gastroparesis ([place]) 08/04/2011  . Achalasia 08/04/2011  . Allergic rhinitis, cause unspecified 08/11/2010  . SPINAL STENOSIS 02/07/2008  . SPONDYLOLYSIS 02/07/2008  . SPONDYLOLITHESIS 02/07/2008    Rexanne Mano, PT 09/10/2016, 11:34 AM  [facility] [address] [place], [place], [address] Phone: [phone]   Fax:  [phone]  Name: [name] MRN: [id number] Date of Birth: Oct 28, 1940

## 2016-09-12 ENCOUNTER — Ambulatory Visit: Payer: Medicare Other

## 2016-09-12 DIAGNOSIS — R2689 Other abnormalities of gait and mobility: Secondary | ICD-10-CM

## 2016-09-12 DIAGNOSIS — R42 Dizziness and giddiness: Secondary | ICD-10-CM

## 2016-09-12 DIAGNOSIS — H8113 Benign paroxysmal vertigo, bilateral: Secondary | ICD-10-CM

## 2016-09-12 NOTE — Patient Instructions (Addendum)
  Perform at kitchen sink with chair behind you OR in a corner with chair in front of you for safety:  Feet Together, Head Motion - Eyes Open    Hold sink as needed. With eyes open, feet together, move head slowly: up and down 5 times and side to side 5 times. Repeat __3__ times per session. Do __1__ sessions per day.  Copyright  VHI. All rights reserved.  Feet Together, Varied Arm Positions - Eyes Closed    Stand with feet together and arms at your side, holding sink as needed. Close eyes and visualize upright position. Hold _10-30___ seconds. Repeat __3__ times per session. Do __1__ sessions per day.  Copyright  VHI. All rights reserved.

## 2016-09-12 NOTE — Therapy (Signed)
[facility] [name] [name], [place], [address] Phone: [phone]   Fax:  [phone]  Physical Therapy Treatment  Patient Details  Name: [name] MRN: [id number] Date of Birth: Dec 31, 1940 Referring Provider: Dr. Naaman [name]  Encounter Date: 09/12/2016      PT End of Session - 09/12/16 1310    Visit Number 4   Number of Visits 17   Date for PT Re-Evaluation 10/20/16   Authorization Type G-code and progress note every 10th visit.    PT Start Time 1018   PT Stop Time 1100   PT Time Calculation (min) 42 min   Equipment Utilized During Treatment --  min guard to S for safety   Activity Tolerance Patient tolerated treatment well   Behavior During Therapy [facility] for tasks assessed/performed      Past Medical History:  Diagnosis Date  . Anemia   . Anxiety   . Arthritis    "all over" (04/19/2013)  . Bleeding stomach ulcer 1980's  . Bloating   . Chronic back pain   . Chronic headaches   . Chronic heartburn   . Chronic neck pain   . Chronic pain   . Colon cancer ([place])   . DDD (degenerative disc disease), lumbar   . Diabetic peripheral neuropathy ([place])    "in my feet" (04/19/2013)  . Dizziness   . Dysphagia   . Family history of anesthesia complication    "daughter has PONV too" (04/19/2013)  . Fibromyalgia   . Gastroesophageal reflux   . Gastroparesis   . Glaucoma, bilateral   . Hiatal hernia    "had it before; had OR; got it again" (04/19/2013)  . History of blood transfusion 1980's   "w/bleeding stomach ulcer" (04/19/2013)  . Hypertension    "for diabetes; don't have high blood pressure" (04/19/2013)  . Migraine    "take RX for it qd" (04/19/2013)  . Nausea   . Pneumonia 04/2012  . PONV (postoperative nausea and vomiting)   . Renal insufficiency   . TIA (transient ischemic attack)    "3-4 before starting RX; none since" (04/19/2013)  . Type II diabetes mellitus ([place])   . Walking pneumonia ~  1966    Past Surgical History:  Procedure Laterality Date  . ABDOMINAL ADHESION SURGERY  ~ 2012   "repaired wrap where they did hiatal hernia OR too" 911/04/2013)  . ANTERIOR CERVICAL DISCECTOMY  ~ 2009   "only cleaned out arthritis and spurs" (04/19/2013)  . CATARACT EXTRACTION W/ INTRAOCULAR LENS IMPLANT Left 04/13/2013  . CHOLECYSTECTOMY  1980's  . COLON SURGERY    . COLONOSCOPY  10/11/2010  . COLONOSCOPY  11/23/2009  . COLONOSCOPY  07/28/2008   W/SNARE  . COLONOSCOPY  06/28/07  . COLONOSCOPY  05/10/07   W/POLYP  . COLONOSCOPY  12/28/00  . COLONOSCOPY N/A 06/29/2015   Procedure: COLONOSCOPY;  Surgeon: Tammy Houston, MD;  [facility];  Service: Endoscopy;  Laterality: N/A;  135  . COLONOSCOPY WITH ESOPHAGOGASTRODUODENOSCOPY (EGD) N/A 05/13/2013   Procedure: COLONOSCOPY WITH ESOPHAGOGASTRODUODENOSCOPY (EGD);  Surgeon: Tammy Houston, MD;  [facility];  Service: Endoscopy;  Laterality: N/A;  855  . HEMICOLECTOMY  2010   ZIEGLER  . HIATAL HERNIA REPAIR  1970's  . LEFT HEART CATHETERIZATION WITH CORONARY ANGIOGRAM N/A 04/19/2013   Procedure: LEFT HEART CATHETERIZATION WITH CORONARY ANGIOGRAM;  Surgeon: Tammy M Martinique, MD;  Location: [facility] CATH LAB;  Service: Cardiovascular;  Laterality: N/A;  .  [employer]   "had a broken neck" (04/19/2013)  . TONSILLECTOMY  ~ 1953  . UPPER GASTROINTESTINAL ENDOSCOPY  10/11/2010   EGD ED  . UPPER GASTROINTESTINAL ENDOSCOPY  11/23/2009  . UPPER GASTROINTESTINAL ENDOSCOPY  05/10/07   EGD ED  . UPPER GASTROINTESTINAL ENDOSCOPY  08/11/01   EGD ED  . VAGINAL HYSTERECTOMY      There were no vitals filed for this visit.      Subjective Assessment - 09/12/16 1021    Subjective Pt states she's feeling a lot better. Pt reported she had L head pain when during supine to sit txf, where skull fx is located. Pt denied dizziness since last visit.    Patient is accompained by: Family member  [name]    Pertinent History HOH, fibromyalgia, DM with neuropathy in feet, hx of Colon CA 5 years ago, anxiety, TIA, HTN, kidney disease, DDD, chronic neck and back pain, spondylolysis and spondylolithesis, glaucoma, diabetic gastroparesis, unstable angina, spinal stenosis   Patient Stated Goals Stop stumbling around and move without falling   Currently in Pain? Yes   Pain Score 4    Pain Location Neck   Pain Orientation Mid   Pain Descriptors / Indicators Sore   Pain Type Chronic pain   Pain Onset More than a month ago   Pain Frequency Intermittent   Aggravating Factors  bad weather   Pain Relieving Factors pain medication                Vestibular Assessment - 09/12/16 0001      Dix-Hallpike Right   Dix-Hallpike Right Duration none   Dix-Hallpike Right Symptoms No nystagmus     Dix-Hallpike Left   Dix-Hallpike Left Duration <5 seconds-2 beats of nystagmus   Dix-Hallpike Left Symptoms Upbeat, left rotatory nystagmus                 [facility] Adult PT Treatment/Exercise - 09/12/16 1033      Ambulation/Gait   Ambulation/Gait Yes   Ambulation/Gait Assistance 5: Supervision;4: Min guard   Ambulation/Gait Assistance Details Min guard during first 10' after STS txf, after Epley's treatment.    Ambulation Distance (Feet) 300 Feet   Assistive device Straight cane   Gait Pattern Step-through pattern;Decreased stride length   Ambulation Surface Level;Indoor   Gait velocity 2.19ft/sec.     Standardized Balance Assessment   Standardized Balance Assessment Dynamic Gait Index     Dynamic Gait Index   Level Surface Mild Impairment   Change in Gait Speed Mild Impairment   Gait with Horizontal Head Turns Mild Impairment   Gait with Vertical Head Turns Mild Impairment   Gait and Pivot Turn Mild Impairment   Step Over Obstacle Mild Impairment   Step Around Obstacles Mild Impairment   Steps Moderate Impairment   Total Score 15     High Level Balance   High Level Balance Activities  Head turns   High Level Balance Comments Performed at counter with 1 UE support and min guard to S for safety: pt performed head turns/nods 2x10'/activity. Cues and demo for technique, LOB and incr. postural sway noted and required min guard and UE support to maintain balance. Not added to HEP.          Vestibular Treatment/Exercise - 09/12/16 1030      Vestibular Treatment/Exercise   Vestibular Treatment Provided Canalith Repositioning   Canalith Repositioning Epley Manuever Left      EPLEY MANUEVER LEFT   Number of  Reps  1   Overall Response  Symptoms Resolved    RESPONSE DETAILS LEFT Dizziness and nystagmus resolved after 1st treatment.             Balance Exercises - 09/12/16 1316      Balance Exercises: Standing   Standing Eyes Opened Narrow base of support (BOS);Wide (BOA);Head turns;Solid surface;5 reps;30 secs   Standing Eyes Closed Narrow base of support (BOS);Solid surface;Wide (BOA);5 reps;10 secs   Other Standing Exercises Pt required S to ensure safety, performed with 1-2 UE support and cues and demo for technique. Performed at counter with chair in front of pt. See pt instructions for details.            PT Education - 09/12/16 1307    Education provided Yes   Education Details PT discussed positional testing results, and outcome measure results. PT reiterated the importance of continuing PT in order to improve balance during functional activities.  PT added to pt's balance HEP.   Person(s) Educated Patient;Other (comment)  pt's friend [name]:   Methods Explanation;Demonstration;Verbal cues;Handout   Comprehension Returned demonstration;Verbalized understanding;Need further instruction          PT Short Term Goals - 08/21/16 1529      PT SHORT TERM GOAL #1   Title Pt will be IND In HEP to improve balance, dizziness, and strength. TARGET DATE FOR ALL STGS: 09/18/16   Status New     PT SHORT TERM GOAL #2   Title Pt will improve DGI score to >/=17/24 to  decr. falls risk.    Status New     PT SHORT TERM GOAL #3   Title Pt will improve gait speed to >/=3.62ft/sec., with SPC, to safely amb in the community.    Status New     PT SHORT TERM GOAL #4   Title Pt will amb. 500' over even terrain, while performing head turns, with SPC at MOD I level and no incr. in dizziness to improve functional mobility.    Status New     PT SHORT TERM GOAL #5   Title Perform vestibular assessment and write goals as indicated.    Status New           PT Long Term Goals - 08/21/16 1533      PT LONG TERM GOAL #1   Title Pt will verbalize understanding of fall risk prevention strategies to decr. falls risk. TARGET DATE FOR ALL LTGS: 10/16/16   Status New     PT LONG TERM GOAL #2   Title Pt will improve DGI score to >/=20/24 to decr. falls risk.    Status New     PT LONG TERM GOAL #3   Title Pt will amb. 700' outdoors, over even and uneven surfaces, with SPC at MOD I level, while performing head turns to improve functional mobility.    Status New     PT LONG TERM GOAL #4   Title Pt will report HA pain is </=7/10 at worst in order to improve quality of life.    Status New     PT LONG TERM GOAL #5   Title Write FOTO goal as indicated.    Status New               Plan - 09/12/16 1310    Clinical Impression Statement Pt demonstrated progress, as pt's dizziness is improving and completely resolved after today's L Epley's treatment. Pt's DGI score continues to indicate pt is at risk  for falls and pt's gait speed continues to be just below 2.56ft/sec-which indicates pt is not able to safely amb. in the community. Pt would continue to benefit from skilled PT to improve safety during functional mobility.    Rehab Potential Good   Clinical Impairments Affecting Rehab Potential HOH, fibromyalgia, DM with neuropathy in feet, hx of Colon CA 5 years ago, anxiety, TIA, HTN, kidney disease, DDD, chronic neck and back pain, spondylolysis and  spondylolithesis, glaucoma, diabetic gastroparesis, unstable angina, spinal stenosis   PT Frequency 2x / week   PT Duration 8 weeks   PT Treatment/Interventions ADLs/Self Care Home Management;Biofeedback;Canalith Repostioning;Gait training;Stair training;Functional mobility training;Therapeutic activities;Therapeutic exercise;Balance training;Patient/family education;Cognitive remediation;Neuromuscular re-education;Manual techniques;Vestibular   PT Next Visit Plan Reassess for B pBPPV, if indicated; add to balance/dizziness HEP as indicated and tolerated. Gait training and balance training to incr. vestibular input.    PT Home Exercise Plan SLS   Consulted and Agree with Plan of Care Patient;Other (Comment)   Family Member Consulted [name]-family friend      Patient will benefit from skilled therapeutic intervention in order to improve the following deficits and impairments:  Abnormal gait, Decreased cognition, Dizziness, Impaired sensation, Decreased balance, Decreased strength, Decreased mobility, Pain  Visit Diagnosis: BPPV (benign paroxysmal positional vertigo), bilateral  Dizziness and giddiness  Other abnormalities of gait and mobility     Problem List Patient Active Problem List   Diagnosis Date Noted  . Post concussion syndrome 07/02/2016  . Skull fracture ([place]) 02/03/2016  . Occipital fracture ([place]) 02/02/2016  . Lactic acidosis   . Ureteral calculus, right   . Nephrolithiasis 12/05/2014  . Elevated lactic acid level 12/05/2014  . Unstable angina ([place]) 04/19/2013  . History of GI bleed 04/19/2013  . Fibromyalgia 04/19/2013  . GERD (gastroesophageal reflux disease) 04/19/2013  . Coronary artery calcification seen on CAT scan 04/19/2013  . Bradycardia 04/19/2013  . History of anemia 12/02/2011  . History of colonic polyps 08/04/2011  . Diabetic gastroparesis ([place]) 08/04/2011  . Achalasia 08/04/2011  . Allergic rhinitis, cause unspecified 08/11/2010  . SPINAL STENOSIS  02/07/2008  . SPONDYLOLYSIS 02/07/2008  . SPONDYLOLITHESIS 02/07/2008    Miller,Jennifer L 09/12/2016, 1:19 PM  [facility] [address] [place], [place], [address] Phone: [phone]   Fax:  [phone]  Name: BRYAH OCHELTREE MRN: [id number] Date of Birth: 04/05/1941  Geoffry Paradise, PT,DPT 09/12/16 1:19 PM Phone: [phone] Fax: [phone]

## 2016-09-15 ENCOUNTER — Encounter: Payer: Medicare Other | Admitting: Physical Therapy

## 2016-09-17 ENCOUNTER — Encounter: Payer: Self-pay | Admitting: Physical Therapy

## 2016-09-17 ENCOUNTER — Ambulatory Visit: Payer: Medicare Other | Admitting: Physical Therapy

## 2016-09-17 DIAGNOSIS — R2689 Other abnormalities of gait and mobility: Secondary | ICD-10-CM

## 2016-09-17 DIAGNOSIS — R42 Dizziness and giddiness: Secondary | ICD-10-CM

## 2016-09-17 NOTE — Patient Instructions (Signed)
  Feet Partial Heel-Toe (Compliant Surface) Head Motion - Eyes Closed    Stand on compliant surface: pillows, folded blanket (you should not feel the floor through the layers). With right foot partially in front of the other. Close eyes and hold 30 seconds. Switch and put left foot in front, close eyes and hold 30 seconds. To make it harder, close eyes and move head slowly, up and down and then side to side. Repeat 3 times per session. Do 1 sessions per day.  Copyright  VHI. All rights reserved.   ANKLE: Plantarflexion, Bilateral - Standing    Stand by counter or back of chair with upright posture. Raise heels up as high as possible. Hold and slowly lower. _10__ reps per set, __1_ sets per day, __5_ days per week Hold onto a support.  Copyright  VHI. All rights reserved.

## 2016-09-17 NOTE — Therapy (Signed)
[facility] [address] [place], [place], [address] Phone: [phone]   Fax:  [phone]  Physical Therapy Treatment  Patient Details  Name: [name] MRN: [id number] Date of Birth: 01-09-1941 Referring Provider: Dr. Naaman Plummer  Encounter Date: 09/17/2016      PT End of Session - 09/17/16 1940    Visit Number 5   Number of Visits 17   Date for PT Re-Evaluation 10/20/16   Authorization Type G-code and progress note every 10th visit.    PT Start Time [phone]   PT Stop Time 0928   PT Time Calculation (min) 42 min   Equipment Utilized During Treatment --  min guard to S for safety   Activity Tolerance Patient tolerated treatment well   Behavior During Therapy [facility] for tasks assessed/performed      Past Medical History:  Diagnosis Date  . Anemia   . Anxiety   . Arthritis    "all over" (04/19/2013)  . Bleeding stomach ulcer 1980's  . Bloating   . Chronic back pain   . Chronic headaches   . Chronic heartburn   . Chronic neck pain   . Chronic pain   . Colon cancer ([place])   . DDD (degenerative disc disease), lumbar   . Diabetic peripheral neuropathy ([place])    "in my feet" (04/19/2013)  . Dizziness   . Dysphagia   . Family history of anesthesia complication    "daughter has PONV too" (04/19/2013)  . Fibromyalgia   . Gastroesophageal reflux   . Gastroparesis   . Glaucoma, bilateral   . Hiatal hernia    "had it before; had OR; got it again" (04/19/2013)  . History of blood transfusion 1980's   "w/bleeding stomach ulcer" (04/19/2013)  . Hypertension    "for diabetes; don't have high blood pressure" (04/19/2013)  . Migraine    "take RX for it qd" (04/19/2013)  . Nausea   . Pneumonia 04/2012  . PONV (postoperative nausea and vomiting)   . Renal insufficiency   . TIA (transient ischemic attack)    "3-4 before starting RX; none since" (04/19/2013)  . Type II diabetes mellitus ([place])   . Walking pneumonia  ~ 1966    Past Surgical History:  Procedure Laterality Date  . ABDOMINAL ADHESION SURGERY  ~ 2012   "repaired wrap where they did hiatal hernia OR too" 911/04/2013)  . ANTERIOR CERVICAL DISCECTOMY  ~ 2009   "only cleaned out arthritis and spurs" (04/19/2013)  . CATARACT EXTRACTION W/ INTRAOCULAR LENS IMPLANT Left 04/13/2013  . CHOLECYSTECTOMY  1980's  . COLON SURGERY    . COLONOSCOPY  10/11/2010  . COLONOSCOPY  11/23/2009  . COLONOSCOPY  07/28/2008   W/SNARE  . COLONOSCOPY  06/28/07  . COLONOSCOPY  05/10/07   W/POLYP  . COLONOSCOPY  12/28/00  . COLONOSCOPY N/A 06/29/2015   Procedure: COLONOSCOPY;  Surgeon: Rogene Houston, MD;  [facility];  Service: Endoscopy;  Laterality: N/A;  135  . COLONOSCOPY WITH ESOPHAGOGASTRODUODENOSCOPY (EGD) N/A 05/13/2013   Procedure: COLONOSCOPY WITH ESOPHAGOGASTRODUODENOSCOPY (EGD);  Surgeon: Rogene Houston, MD;  [facility];  Service: Endoscopy;  Laterality: N/A;  855  . HEMICOLECTOMY  2010   ZIEGLER  . HIATAL HERNIA REPAIR  1970's  . LEFT HEART CATHETERIZATION WITH CORONARY ANGIOGRAM N/A 04/19/2013   Procedure: LEFT HEART CATHETERIZATION WITH CORONARY ANGIOGRAM;  Surgeon: Peter M Martinique, MD;  Location: [facility] CATH LAB;  Service: Cardiovascular;  Laterality: N/A;  .  [place]   "had a broken neck" (04/19/2013)  . TONSILLECTOMY  ~ 1953  . UPPER GASTROINTESTINAL ENDOSCOPY  10/11/2010   EGD ED  . UPPER GASTROINTESTINAL ENDOSCOPY  11/23/2009  . UPPER GASTROINTESTINAL ENDOSCOPY  05/10/07   EGD ED  . UPPER GASTROINTESTINAL ENDOSCOPY  08/11/01   EGD ED  . VAGINAL HYSTERECTOMY      There were no vitals filed for this visit.      Subjective Assessment - 09/17/16 0849    Subjective States had a brief dizzy spell last night when lying down on her right side. Thinks it may be her sinuses (has allergies). No falls or LOB.    Patient is accompained by: Family member  [name]   Pertinent History HOH,  fibromyalgia, DM with neuropathy in feet, hx of Colon CA 5 years ago, anxiety, TIA, HTN, kidney disease, DDD, chronic neck and back pain, spondylolysis and spondylolithesis, glaucoma, diabetic gastroparesis, unstable angina, spinal stenosis   Patient Stated Goals Stop stumbling around and move without falling   Currently in Pain? Yes   Pain Score 8    Pain Location Neck   Pain Orientation Lower   Pain Descriptors / Indicators Sore   Pain Type Chronic pain   Pain Onset More than a month ago   Pain Frequency Constant                Vestibular Assessment - 09/17/16 0001      Symptom Behavior   Type of Dizziness "Funny feeling in head"   Frequency of Dizziness One time since last visit   Duration of Dizziness seconds   Aggravating Factors Comment  sit to rt side   Relieving Factors Rest     Positional Testing   Dix-Hallpike Dix-Hallpike Right;Dix-Hallpike Left     Dix-Hallpike Right   Dix-Hallpike Right Duration none   Dix-Hallpike Right Symptoms No nystagmus     Dix-Hallpike Left   Dix-Hallpike Left Duration none   Dix-Hallpike Left Symptoms No nystagmus                 [facility] Adult PT Treatment/Exercise - 09/17/16 0001      Bed Mobility   Bed Mobility --  assist during Hallpike-Dix maneuvers     Ambulation/Gait   Ambulation/Gait Assistance 5: Supervision;4: Min guard   Ambulation/Gait Assistance Details no stagger or drifting with gait with head turns   Ambulation Distance (Feet) 240 Feet   Assistive device Straight cane   Gait Pattern Step-through pattern;Decreased stride length   Ambulation Surface Level;Indoor     Exercises   Exercises Knee/Hip;Ankle     Knee/Hip Exercises: Standing   Heel Raises Both;Right;Left;3 sets;10 reps  UE support   Hip Abduction Stengthening;Right;Left;1 set;10 reps  bil UE support             Balance Exercises - 09/17/16 1935      Balance Exercises: Standing   Standing Eyes Opened Narrow base of support  (BOS);Wide (BOA);Head turns;Foam/compliant surface   Standing Eyes Closed Narrow base of support (BOS);Wide (BOA);Foam/compliant surface   Tandem Stance Eyes open;Upper extremity support 1;2 reps   SLS Eyes open;Upper extremity support 1;Solid surface;2 reps   Gait with Head Turns Forward;Upper extremity support  cane           PT Education - 09/17/16 1939    Education provided Yes   Education Details added to [employer]) Educated Patient;Child(ren)   Methods Explanation;Demonstration;Handout;Verbal cues   Comprehension Verbalized  understanding;Need further instruction          PT Short Term Goals - 08/21/16 1529      PT SHORT TERM GOAL #1   Title Pt will be IND In HEP to improve balance, dizziness, and strength. TARGET DATE FOR ALL STGS: 09/18/16   Status New     PT SHORT TERM GOAL #2   Title Pt will improve DGI score to >/=17/24 to decr. falls risk.    Status New     PT SHORT TERM GOAL #3   Title Pt will improve gait speed to >/=3.55ft/sec., with SPC, to safely amb in the community.    Status New     PT SHORT TERM GOAL #4   Title Pt will amb. 500' over even terrain, while performing head turns, with SPC at MOD I level and no incr. in dizziness to improve functional mobility.    Status New     PT SHORT TERM GOAL #5   Title Perform vestibular assessment and write goals as indicated.    Status New           PT Long Term Goals - 08/21/16 1533      PT LONG TERM GOAL #1   Title Pt will verbalize understanding of fall risk prevention strategies to decr. falls risk. TARGET DATE FOR ALL LTGS: 10/16/16   Status New     PT LONG TERM GOAL #2   Title Pt will improve DGI score to >/=20/24 to decr. falls risk.    Status New     PT LONG TERM GOAL #3   Title Pt will amb. 700' outdoors, over even and uneven surfaces, with SPC at MOD I level, while performing head turns to improve functional mobility.    Status New     PT LONG TERM GOAL #4   Title Pt will report HA  pain is </=7/10 at worst in order to improve quality of life.    Status New     PT LONG TERM GOAL #5   Title Write FOTO goal as indicated.    Status New               Plan - 09/17/16 1941    Clinical Impression Statement Patient with negative testing for BPPV and pt/daughter report she is nearly back to her baseline. Session focused on balance training and updating HEP. Patient making excellent progress.    Rehab Potential Good   Clinical Impairments Affecting Rehab Potential HOH, fibromyalgia, DM with neuropathy in feet, hx of Colon CA 5 years ago, anxiety, TIA, HTN, kidney disease, DDD, chronic neck and back pain, spondylolysis and spondylolithesis, glaucoma, diabetic gastroparesis, unstable angina, spinal stenosis   PT Frequency 2x / week   PT Duration 8 weeks   PT Treatment/Interventions ADLs/Self Care Home Management;Biofeedback;Canalith Repostioning;Gait training;Stair training;Functional mobility training;Therapeutic activities;Therapeutic exercise;Balance training;Patient/family education;Cognitive remediation;Neuromuscular re-education;Manual techniques;Vestibular   PT Next Visit Plan Assess DGI, gait velocity and compare to goals--? can d/c next visit (drives from [place]); add to balance/dizziness HEP as indicated and tolerated. Gait training and balance training to incr. vestibular input.    PT Home Exercise Plan SLS   Consulted and Agree with Plan of Care Patient;Family member/caregiver   Family Member Consulted daughters      Patient will benefit from skilled therapeutic intervention in order to improve the following deficits and impairments:  Abnormal gait, Decreased cognition, Dizziness, Impaired sensation, Decreased balance, Decreased strength, Decreased mobility, Pain  Visit Diagnosis: Dizziness and giddiness  Other abnormalities of gait and mobility     Problem List Patient Active Problem List   Diagnosis Date Noted  . Post concussion syndrome  07/02/2016  . Skull fracture ([place]) 02/03/2016  . Occipital fracture ([place]) 02/02/2016  . Lactic acidosis   . Ureteral calculus, right   . Nephrolithiasis 12/05/2014  . Elevated lactic acid level 12/05/2014  . Unstable angina ([place]) 04/19/2013  . History of GI bleed 04/19/2013  . Fibromyalgia 04/19/2013  . GERD (gastroesophageal reflux disease) 04/19/2013  . Coronary artery calcification seen on CAT scan 04/19/2013  . Bradycardia 04/19/2013  . History of anemia 12/02/2011  . History of colonic polyps 08/04/2011  . Diabetic gastroparesis ([place]) 08/04/2011  . Achalasia 08/04/2011  . Allergic rhinitis, cause unspecified 08/11/2010  . SPINAL STENOSIS 02/07/2008  . SPONDYLOLYSIS 02/07/2008  . SPONDYLOLITHESIS 02/07/2008    Rexanne Mano, PT 09/17/2016, 7:52 PM  [facility] [address] [place], [place], [address] Phone: [phone]   Fax:  [phone]  Name: ELVI LEVENTHAL MRN: [id number] Date of Birth: 1941/05/03

## 2016-09-19 ENCOUNTER — Ambulatory Visit: Payer: Medicare Other | Admitting: Physical Therapy

## 2016-09-19 ENCOUNTER — Encounter: Payer: Self-pay | Admitting: Physical Therapy

## 2016-09-19 DIAGNOSIS — R2689 Other abnormalities of gait and mobility: Secondary | ICD-10-CM

## 2016-09-19 DIAGNOSIS — R42 Dizziness and giddiness: Secondary | ICD-10-CM | POA: Diagnosis not present

## 2016-09-19 NOTE — Therapy (Signed)
[facility] [address] [place], [place], [address] Phone: [phone]   Fax:  [phone]  Physical Therapy Treatment  Patient Details  Name: [name] MRN: [id number] Date of Birth: 1941/02/10 Referring Provider: Dr. Naaman Plummer  Encounter Date: 09/19/2016      PT End of Session - 09/19/16 0751    Visit Number 6   Number of Visits 17   Date for PT Re-Evaluation 10/20/16   Authorization Type G-code and progress note every 10th visit.    PT Start Time [phone]   PT Stop Time 0840   PT Time Calculation (min) 41 min   Equipment Utilized During Treatment --  min guard to S for safety   Activity Tolerance Patient tolerated treatment well   Behavior During Therapy [facility] for tasks assessed/performed      Past Medical History:  Diagnosis Date  . Anemia   . Anxiety   . Arthritis    "all over" (04/19/2013)  . Bleeding stomach ulcer 1980's  . Bloating   . Chronic back pain   . Chronic headaches   . Chronic heartburn   . Chronic neck pain   . Chronic pain   . Colon cancer ([place])   . DDD (degenerative disc disease), lumbar   . Diabetic peripheral neuropathy ([place])    "in my feet" (04/19/2013)  . Dizziness   . Dysphagia   . Family history of anesthesia complication    "daughter has PONV too" (04/19/2013)  . Fibromyalgia   . Gastroesophageal reflux   . Gastroparesis   . Glaucoma, bilateral   . Hiatal hernia    "had it before; had OR; got it again" (04/19/2013)  . History of blood transfusion 1980's   "w/bleeding stomach ulcer" (04/19/2013)  . Hypertension    "for diabetes; don't have high blood pressure" (04/19/2013)  . Migraine    "take RX for it qd" (04/19/2013)  . Nausea   . Pneumonia 04/2012  . PONV (postoperative nausea and vomiting)   . Renal insufficiency   . TIA (transient ischemic attack)    "3-4 before starting RX; none since" (04/19/2013)  . Type II diabetes mellitus ([place])   . Walking pneumonia  ~ 1966    Past Surgical History:  Procedure Laterality Date  . ABDOMINAL ADHESION SURGERY  ~ 2012   "repaired wrap where they did hiatal hernia OR too" 911/04/2013)  . ANTERIOR CERVICAL DISCECTOMY  ~ 2009   "only cleaned out arthritis and spurs" (04/19/2013)  . CATARACT EXTRACTION W/ INTRAOCULAR LENS IMPLANT Left 04/13/2013  . CHOLECYSTECTOMY  1980's  . COLON SURGERY    . COLONOSCOPY  10/11/2010  . COLONOSCOPY  11/23/2009  . COLONOSCOPY  07/28/2008   W/SNARE  . COLONOSCOPY  06/28/07  . COLONOSCOPY  05/10/07   W/POLYP  . COLONOSCOPY  12/28/00  . COLONOSCOPY N/A 06/29/2015   Procedure: COLONOSCOPY;  Surgeon: Rogene Houston, MD;  [facility];  Service: Endoscopy;  Laterality: N/A;  135  . COLONOSCOPY WITH ESOPHAGOGASTRODUODENOSCOPY (EGD) N/A 05/13/2013   Procedure: COLONOSCOPY WITH ESOPHAGOGASTRODUODENOSCOPY (EGD);  Surgeon: Rogene Houston, MD;  [facility];  Service: Endoscopy;  Laterality: N/A;  855  . HEMICOLECTOMY  2010   ZIEGLER  . HIATAL HERNIA REPAIR  1970's  . LEFT HEART CATHETERIZATION WITH CORONARY ANGIOGRAM N/A 04/19/2013   Procedure: LEFT HEART CATHETERIZATION WITH CORONARY ANGIOGRAM;  Surgeon: Peter M Martinique, MD;  Location: [facility] CATH LAB;  Service: Cardiovascular;  Laterality: N/A;  .  POSTERIOR FUSION CERVICAL SPINE  1985   "had a broken neck" (04/19/2013)  . TONSILLECTOMY  ~ 1953  . UPPER GASTROINTESTINAL ENDOSCOPY  10/11/2010   EGD ED  . UPPER GASTROINTESTINAL ENDOSCOPY  11/23/2009  . UPPER GASTROINTESTINAL ENDOSCOPY  05/10/07   EGD ED  . UPPER GASTROINTESTINAL ENDOSCOPY  08/11/01   EGD ED  . VAGINAL HYSTERECTOMY      There were no vitals filed for this visit.      Subjective Assessment - 09/19/16 0749    Subjective Reports no dizziness. No headache right now. Denies significant Losses of Balance and feels she is at her baseline.    Patient is accompained by: Family member  [name]   Pertinent History HOH, fibromyalgia, DM with  neuropathy in feet, hx of Colon CA 5 years ago, anxiety, TIA, HTN, kidney disease, DDD, chronic neck and back pain, spondylolysis and spondylolithesis, glaucoma, diabetic gastroparesis, unstable angina, spinal stenosis   Patient Stated Goals Stop stumbling around and move without falling   Currently in Pain? Yes   Pain Score 5    Pain Location Shoulder   Pain Orientation Right;Left   Pain Descriptors / Indicators Sore   Pain Type Chronic pain   Pain Onset More than a month ago   Pain Frequency Intermittent            [facility] PT Assessment - 09/19/16 0001      Functional Gait  Assessment   Gait assessed  Yes   Gait Level Surface Walks 20 ft, slow speed, abnormal gait pattern, evidence for imbalance or deviates 10-15 in outside of the 12 in walkway width. Requires more than 7 sec to ambulate 20 ft.   Change in Gait Speed Able to smoothly change walking speed without loss of balance or gait deviation. Deviate no more than 6 in outside of the 12 in walkway width.   Gait with Horizontal Head Turns Performs head turns smoothly with slight change in gait velocity (eg, minor disruption to smooth gait path), deviates 6-10 in outside 12 in walkway width, or uses an assistive device.   Gait with Vertical Head Turns Performs head turns with no change in gait. Deviates no more than 6 in outside 12 in walkway width.   Gait and Pivot Turn Pivot turns safely in greater than 3 sec and stops with no loss of balance, or pivot turns safely within 3 sec and stops with mild imbalance, requires small steps to catch balance.   Step Over Obstacle Is able to step over one shoe box (4.5 in total height) but must slow down and adjust steps to clear box safely. May require verbal cueing.   Gait with Narrow Base of Support Ambulates less than 4 steps heel to toe or cannot perform without assistance.   Gait with Eyes Closed Walks 20 ft, slow speed, abnormal gait pattern, evidence for imbalance, deviates 10-15 in outside  12 in walkway width. Requires more than 9 sec to ambulate 20 ft.   Ambulating Backwards Walks 20 ft, uses assistive device, slower speed, mild gait deviations, deviates 6-10 in outside 12 in walkway width.   Steps Two feet to a stair, must use rail.   Total Score 16                     [facility] Adult PT Treatment/Exercise - 09/19/16 0001      Transfers   Transfers Sit to Stand;Stand to Sit   Sit to Stand 6: Modified independent (Device/Increase  time)   Stand to Sit 6: Modified independent (Device/Increase time)     Ambulation/Gait   Ambulation/Gait Assistance 6: Modified independent (Device/Increase time)   Ambulation/Gait Assistance Details walked indoor/outdoor (gravel, grass, mulch, uneven sidewalk) head turns searching left and right for items   Ambulation Distance (Feet) 700 Feet   Assistive device Straight cane   Gait Pattern Step-through pattern;Decreased stride length   Gait velocity 32.8 ft/ 9.96 sec= 3.29 ft/sec   Curb 6: Modified independent (Device/increase time)  x 2     Posture/Postural Control   Posture/Postural Control Postural limitations   Postural Limitations Forward head   Posture Comments s/p cervical fusion with limited neck ROM                PT Education - 09/19/16 1205    Education provided Yes   Education Details continue HEP for improving (or at least maintaining) balance   Person(s) Educated Patient;Child(ren)   Methods Explanation;Demonstration   Comprehension Verbalized understanding          PT Short Term Goals - 09/19/16 0840      PT SHORT TERM GOAL #1   Title Pt will be IND In HEP to improve balance, dizziness, and strength. TARGET DATE FOR ALL STGS: 09/18/16  (Met 09/19/16)   Status Achieved     PT SHORT TERM GOAL #2   Title Pt will improve DGI score to >/=17/24 to decr. falls risk.   09/19/16 16/30   Baseline 09/19/16    Status Not Met     PT SHORT TERM GOAL #3   Title Pt will improve gait speed to >/=3.46f/sec.,  with SPC, to safely amb in the community.  09/19/16 3.29 ft/sec   Status Not Met     PT SHORT TERM GOAL #4   Title Pt will amb. 500' over even terrain, while performing head turns, with SPC at MOD I level and no incr. in dizziness to improve functional mobility. 09/19/16 Met   Status Achieved     PT SHORT TERM GOAL #5   Title Perform vestibular assessment and write goals as indicated.    Baseline completed second and third visits   Status Achieved           PT Long Term Goals - 09/19/16 1209      PT LONG TERM GOAL #1   Title Pt will verbalize understanding of fall risk prevention strategies to decr. falls risk. TARGET DATE FOR ALL LTGS: 10/16/16    09/19/16 Met   Status Achieved     PT LONG TERM GOAL #2   Title Pt will improve DGI score to >/=20/24 to decr. falls risk.    Status Deferred     PT LONG TERM GOAL #3   Title Pt will amb. 700' outdoors, over even and uneven surfaces, with SPC at MOD I level, while performing head turns to improve functional mobility.   Met 09/19/16   Status Achieved     PT LONG TERM GOAL #4   Title Pt will report HA pain is </=7/10 at worst in order to improve quality of life.    Status Achieved     PT LONG TERM GOAL #5   Title Write FOTO goal as indicated.    Status Deferred               Plan - 09/19/16 0837    Clinical Impression Statement Patient has met  3 of 5 STGs and 3 of 5 LTGs (2 goals deferred--discharging prior  to original target date for LTGs) and both patient and daughter feel she is ready for discharge. Patient has been clear of vertigo on final 2 visits and denied dizziness throughout sessions. She feels the severe headaches have cleared and she is dealing with the "normal headaches I get from my neck."   Rehab Potential Good   Clinical Impairments Affecting Rehab Potential HOH, fibromyalgia, DM with neuropathy in feet, hx of Colon CA 5 years ago, anxiety, TIA, HTN, kidney disease, DDD, chronic neck and back pain, spondylolysis  and spondylolithesis, glaucoma, diabetic gastroparesis, unstable angina, spinal stenosis   PT Frequency --   PT Duration --   PT Treatment/Interventions ADLs/Self Care Home Management;Biofeedback;Canalith Repostioning;Gait training;Stair training;Functional mobility training;Therapeutic activities;Therapeutic exercise;Balance training;Patient/family education;Cognitive remediation;Neuromuscular re-education;Manual techniques;Vestibular   PT Home Exercise Plan SLS   Consulted and Agree with Plan of Care Patient;Family member/caregiver   Family Member Consulted daughters      Patient will benefit from skilled therapeutic intervention in order to improve the following deficits and impairments:  Abnormal gait, Decreased cognition, Dizziness, Impaired sensation, Decreased balance, Decreased strength, Decreased mobility, Pain  Visit Diagnosis: Dizziness and giddiness  Other abnormalities of gait and mobility       G-Codes - October 03, 2016 1214    Functional Assessment Tool Used (Outpatient Only) DGI: 16/24 and gait speed with SPC:  3.s9 ft/sec.   Functional Limitation Mobility: Walking and moving around   Mobility: Walking and Moving Around Goal Status [phone]) At least 1 percent but less than 20 percent impaired, limited or restricted   Mobility: Walking and Moving Around Discharge Status [phone]) At least 1 percent but less than 20 percent impaired, limited or restricted      Problem List Patient Active Problem List   Diagnosis Date Noted  . Post concussion syndrome 07/02/2016  . Skull fracture ([place]) 02/03/2016  . Occipital fracture ([place]) 02/02/2016  . Lactic acidosis   . Ureteral calculus, right   . Nephrolithiasis 12/05/2014  . Elevated lactic acid level 12/05/2014  . Unstable angina ([place]) 04/19/2013  . History of GI bleed 04/19/2013  . Fibromyalgia 04/19/2013  . GERD (gastroesophageal reflux disease) 04/19/2013  . Coronary artery calcification seen on CAT scan 04/19/2013  .  Bradycardia 04/19/2013  . History of anemia 12/02/2011  . History of colonic polyps 08/04/2011  . Diabetic gastroparesis ([place]) 08/04/2011  . Achalasia 08/04/2011  . Allergic rhinitis, cause unspecified 08/11/2010  . SPINAL STENOSIS 02/07/2008  . SPONDYLOLYSIS 02/07/2008  . SPONDYLOLITHESIS 02/07/2008   PHYSICAL THERAPY DISCHARGE SUMMARY  Visits from Start of Care: 6  Current functional level related to goals / functional outcomes: See above   Remaining deficits: Per FGA, remains a fall risk (scored 17/30; <22 indicates higher fall risk); pt using SPC for balance   Education / Equipment: Educated on [employer]  Plan: Patient agrees to discharge.  Patient goals were partially met. Patient is being discharged due to the physician's request.  ?????        Rexanne Mano, PT October 03, 2016, 12:18 PM  [facility] [address] [place], [place], [address] Phone: [phone]   Fax:  [phone]  Name: [name] MRN: [id number] Date of Birth: 06-10-1940

## 2016-09-29 LAB — HEMOGLOBIN AND HEMATOCRIT, BLOOD
HCT: 36 % (ref 35.0–45.0)
Hemoglobin: 11.6 g/dL — ABNORMAL LOW (ref 11.7–15.5)

## 2016-10-01 ENCOUNTER — Other Ambulatory Visit (INDEPENDENT_AMBULATORY_CARE_PROVIDER_SITE_OTHER): Payer: Self-pay | Admitting: *Deleted

## 2016-10-01 DIAGNOSIS — D508 Other iron deficiency anemias: Secondary | ICD-10-CM

## 2016-10-08 ENCOUNTER — Encounter: Payer: Medicare Other | Admitting: Physical Medicine & Rehabilitation

## 2016-10-08 ENCOUNTER — Encounter: Payer: Medicare Other | Attending: Physical Medicine & Rehabilitation | Admitting: Physical Medicine & Rehabilitation

## 2016-10-08 ENCOUNTER — Encounter: Payer: Self-pay | Admitting: Physical Medicine & Rehabilitation

## 2016-10-08 VITALS — BP 159/74 | HR 55

## 2016-10-08 DIAGNOSIS — Z833 Family history of diabetes mellitus: Secondary | ICD-10-CM | POA: Insufficient documentation

## 2016-10-08 DIAGNOSIS — M797 Fibromyalgia: Secondary | ICD-10-CM | POA: Insufficient documentation

## 2016-10-08 DIAGNOSIS — Z9181 History of falling: Secondary | ICD-10-CM | POA: Diagnosis not present

## 2016-10-08 DIAGNOSIS — G8929 Other chronic pain: Secondary | ICD-10-CM | POA: Insufficient documentation

## 2016-10-08 DIAGNOSIS — Z8249 Family history of ischemic heart disease and other diseases of the circulatory system: Secondary | ICD-10-CM | POA: Insufficient documentation

## 2016-10-08 DIAGNOSIS — Z85038 Personal history of other malignant neoplasm of large intestine: Secondary | ICD-10-CM | POA: Insufficient documentation

## 2016-10-08 DIAGNOSIS — R51 Headache: Secondary | ICD-10-CM | POA: Diagnosis not present

## 2016-10-08 DIAGNOSIS — H8110 Benign paroxysmal vertigo, unspecified ear: Secondary | ICD-10-CM | POA: Diagnosis not present

## 2016-10-08 DIAGNOSIS — N289 Disorder of kidney and ureter, unspecified: Secondary | ICD-10-CM | POA: Diagnosis not present

## 2016-10-08 DIAGNOSIS — Z87891 Personal history of nicotine dependence: Secondary | ICD-10-CM | POA: Insufficient documentation

## 2016-10-08 DIAGNOSIS — K3184 Gastroparesis: Secondary | ICD-10-CM | POA: Diagnosis not present

## 2016-10-08 DIAGNOSIS — F419 Anxiety disorder, unspecified: Secondary | ICD-10-CM | POA: Diagnosis not present

## 2016-10-08 DIAGNOSIS — E1142 Type 2 diabetes mellitus with diabetic polyneuropathy: Secondary | ICD-10-CM | POA: Diagnosis not present

## 2016-10-08 DIAGNOSIS — Z8673 Personal history of transient ischemic attack (TIA), and cerebral infarction without residual deficits: Secondary | ICD-10-CM | POA: Diagnosis not present

## 2016-10-08 DIAGNOSIS — R413 Other amnesia: Secondary | ICD-10-CM | POA: Diagnosis not present

## 2016-10-08 DIAGNOSIS — Z8051 Family history of malignant neoplasm of kidney: Secondary | ICD-10-CM | POA: Diagnosis not present

## 2016-10-08 DIAGNOSIS — G44309 Post-traumatic headache, unspecified, not intractable: Secondary | ICD-10-CM | POA: Diagnosis not present

## 2016-10-08 DIAGNOSIS — Z981 Arthrodesis status: Secondary | ICD-10-CM | POA: Diagnosis not present

## 2016-10-08 DIAGNOSIS — M5136 Other intervertebral disc degeneration, lumbar region: Secondary | ICD-10-CM | POA: Diagnosis not present

## 2016-10-08 DIAGNOSIS — E1143 Type 2 diabetes mellitus with diabetic autonomic (poly)neuropathy: Secondary | ICD-10-CM | POA: Diagnosis not present

## 2016-10-08 DIAGNOSIS — F0781 Postconcussional syndrome: Secondary | ICD-10-CM

## 2016-10-08 DIAGNOSIS — Z8711 Personal history of peptic ulcer disease: Secondary | ICD-10-CM | POA: Diagnosis not present

## 2016-10-08 DIAGNOSIS — K219 Gastro-esophageal reflux disease without esophagitis: Secondary | ICD-10-CM | POA: Diagnosis not present

## 2016-10-08 MED ORDER — TOPIRAMATE 100 MG PO TABS: 100.0000 mg | ORAL_TABLET | Freq: Two times a day (BID) | ORAL | 5 refills | Status: AC

## 2016-10-08 NOTE — Patient Instructions (Signed)
CHANGE CYMBALTA TO TWO TABLETS IN THE MORNING  TRY TO FIND SOME ACTIVITIES SUCH AS READING, PERHAPS RELAXIN MUSIC WITH HEADPHONES TO HELP YOUR MIND "RELAX" WHICH WILL HELP YOUR SLEEP.     RETURN TO DRIVING PLAN:  WITH THE SUPERVISION OF A LICENSED DRIVER, PLEASE DRIVE IN AN EMPTY PARKING LOT FOR AT LEAST 2-3 TRIALS TO TEST REACTION TIME, VISION, USE OF EQUIPMENT IN CAR, ETC.  IF SUCCESSFUL WITH THE PARKING LOT DRIVING, PROCEED TO SUPERVISED DRIVING TRIALS IN YOUR NEIGHBORHOOD STREETS AT LOW TRAFFIC TIMES TO TEST OBSERVATION TO TRAFFIC SIGNALS, REACTION TIME, ETC. PLEASE ATTEMPT AT LEAST 2-3 TRIALS IN YOUR NEIGHBORHOOD.  IF NEIGHBORHOOD DRIVING IS SUCCESSFUL, YOU MAY PROCEED TO DRIVING IN BUSIER AREAS IN YOUR COMMUNITY WITH SUPERVISION OF A LICENSED DRIVER. PLEASE ATTEMPT AT LEAST 4-5 TRIALS.  IF COMMUNITY DRIVING IS SUCCESSFUL, YOU MAY PROCEED TO DRIVING ALONE, DURING THE DAY TIME, IN NON-PEAK TRAFFIC TIMES. YOU SHOULD DRIVE NO FURTHER THAN 15 MINUTES IN ONE DIRECTION. PLEASE DO NOT DRIVE IF YOU FEEL FATIGUED OR UNDER THE INFLUENCE OF MEDICATION.

## 2016-10-08 NOTE — Progress Notes (Signed)
Subjective:    Patient ID: [name], female    DOB: 01-24-1941, 76 y.o.   MRN: [id number]  HPI   Mrs. Karam is here in follow up of her TBI/post-concussion syndrome.  Her course was complicated by intraperitoneal spread of botox from an injection which was intended to relax her pyloric sphincter. She has since recovered and is feeling better.   She began vestibular therapy for her BPPV which has improved her symptoms quite a bit. She is still participating in her HEP. She is at risk for fall (per baseline) but uses her walker or cane and has not had any problems  Her headaches are still present but she notes they have been less intense with the increased topamax. She has not had any issues tolerating the topamax. Headaches are usually wide spread from occiput to frontal areas  Sleep remains an issue. She has a hard time getting to sleep. "my mind races". She tries to read the bible but still cannot relax. Her daughter sleeps in the same room and has the tv on, but mrs. Meldrum states she cannot hear the tv. She takes cymbalta 60mg  BID for depression with the second dose before bed.      Pain Inventory Average Pain 7 Pain Right Now 7 My pain is sharp and aching  In the last 24 hours, has pain interfered with the following? General activity 0 Relation with others 0 Enjoyment of life 0 What TIME of day is your pain at its worst? any Sleep (in general) Fair  Pain is worse with: unsure Pain improves with: medication Relief from Meds: 3  Mobility use a cane ability to climb steps?  yes  Function retired  Neuro/Psych bladder control problems bowel control problems tingling anxiety loss of taste or smell  Prior Studies Any changes since last visit?  no  Physicians involved in your care Any changes since last visit?  no   Family History  Problem Relation Age of Onset  . Heart disease Mother   . Diabetes Mother   . Dementia Father   . Healthy Sister   .  Diabetes Brother   . Kidney cancer Brother   . Diabetes Brother   . Neuropathy Brother   . Diabetes Daughter   . Diabetes Daughter   . Diabetes Son    Social History   Social History  . Marital status: Widowed    Spouse name: N/A  . Number of children: N/A  . Years of education: N/A   Occupational History  .  Retired   Social History Main Topics  . Smoking status: Former Smoker    Packs/day: 1.50    Years: 15.00    Types: Cigarettes    Quit date: 03/17/1991  . Smokeless tobacco: Never Used     Comment: Patient states that it has ben greater than 20 years since she quit smoking  . Alcohol use No  . Drug use: No  . Sexual activity: No   Other Topics Concern  . Not on file   Social History Narrative   Patient lives at home alone.    Patient is retired.    Patient is widowed.    Patient has 4 children.   Patient has a 11th grade education.          Past Surgical History:  Procedure Laterality Date  . ABDOMINAL ADHESION SURGERY  ~ 2012   "repaired wrap where they did hiatal hernia OR too" 911/04/2013)  . ANTERIOR CERVICAL  DISCECTOMY  ~ 2009   "only cleaned out arthritis and spurs" (04/19/2013)  . CATARACT EXTRACTION W/ INTRAOCULAR LENS IMPLANT Left 04/13/2013  . CHOLECYSTECTOMY  1980's  . COLON SURGERY    . COLONOSCOPY  10/11/2010  . COLONOSCOPY  11/23/2009  . COLONOSCOPY  07/28/2008   W/SNARE  . COLONOSCOPY  06/28/07  . COLONOSCOPY  05/10/07   W/POLYP  . COLONOSCOPY  12/28/00  . COLONOSCOPY N/A 06/29/2015   Procedure: COLONOSCOPY;  Surgeon: Rogene Houston, MD;  [facility];  Service: Endoscopy;  Laterality: N/A;  135  . COLONOSCOPY WITH ESOPHAGOGASTRODUODENOSCOPY (EGD) N/A 05/13/2013   Procedure: COLONOSCOPY WITH ESOPHAGOGASTRODUODENOSCOPY (EGD);  Surgeon: Rogene Houston, MD;  Location: [facility];  Service: Endoscopy;  Laterality: N/A;  855  . HEMICOLECTOMY  2010   ZIEGLER  . HIATAL HERNIA REPAIR  1970's  . LEFT HEART CATHETERIZATION WITH  CORONARY ANGIOGRAM N/A 04/19/2013   Procedure: LEFT HEART CATHETERIZATION WITH CORONARY ANGIOGRAM;  Surgeon: Peter M Martinique, MD;  Location: [facility] CATH LAB;  Service: Cardiovascular;  Laterality: N/A;  . [place]   "had a broken neck" (04/19/2013)  . TONSILLECTOMY  ~ 1953  . UPPER GASTROINTESTINAL ENDOSCOPY  10/11/2010   EGD ED  . UPPER GASTROINTESTINAL ENDOSCOPY  11/23/2009  . UPPER GASTROINTESTINAL ENDOSCOPY  05/10/07   EGD ED  . UPPER GASTROINTESTINAL ENDOSCOPY  08/11/01   EGD ED  . VAGINAL HYSTERECTOMY     Past Medical History:  Diagnosis Date  . Anemia   . Anxiety   . Arthritis    "all over" (04/19/2013)  . Bleeding stomach ulcer 1980's  . Bloating   . Chronic back pain   . Chronic headaches   . Chronic heartburn   . Chronic neck pain   . Chronic pain   . Colon cancer ([place])   . DDD (degenerative disc disease), lumbar   . Diabetic peripheral neuropathy ([place])    "in my feet" (04/19/2013)  . Dizziness   . Dysphagia   . Family history of anesthesia complication    "daughter has PONV too" (04/19/2013)  . Fibromyalgia   . Gastroesophageal reflux   . Gastroparesis   . Glaucoma, bilateral   . Hiatal hernia    "had it before; had OR; got it again" (04/19/2013)  . History of blood transfusion 1980's   "w/bleeding stomach ulcer" (04/19/2013)  . Hypertension    "for diabetes; don't have high blood pressure" (04/19/2013)  . Migraine    "take RX for it qd" (04/19/2013)  . Nausea   . Pneumonia 04/2012  . PONV (postoperative nausea and vomiting)   . Renal insufficiency   . TIA (transient ischemic attack)    "3-4 before starting RX; none since" (04/19/2013)  . Type II diabetes mellitus ([place])   . Walking pneumonia ~ 1966   There were no vitals taken for this visit.  Opioid Risk Score:   Fall Risk Score:  `1  Depression screen PHQ 2/9  Depression screen PHQ 2/9 07/02/2016  Decreased Interest 0  Down, Depressed, Hopeless 0  PHQ - 2 Score 0    Altered sleeping 1  Tired, decreased energy 1  Change in appetite 0  Feeling bad or failure about yourself  0  Trouble concentrating 0  Moving slowly or fidgety/restless 0  Suicidal thoughts 0  PHQ-9 Score 2  Difficult doing work/chores Not difficult at all    Review of Systems  Constitutional: Negative.   HENT: Negative.  Eyes: Negative.   Respiratory: Negative.   Cardiovascular: Negative.   Gastrointestinal: Positive for abdominal pain, constipation, nausea and vomiting.  Endocrine: Negative.   Genitourinary: Negative.   Musculoskeletal: Negative.   Allergic/Immunologic: Negative.   Neurological: Negative.   Hematological: Bruises/bleeds easily.  Psychiatric/Behavioral: Negative.   All other systems reviewed and are negative.      Objective:   Physical Exam  General: Alert and oriented x 3, No apparent distress HEENT: Head is normocephalic, atraumatic, PERRLA, EOMI, sclera anicteric, oral mucosa pink and moist, dentition intact, ext ear canals clear,  Neck: Supple without JVD or lymphadenopathy Heart: RRR Chest: CTA B Abdomen: Soft, non-tender, non-distended, bowel sounds positive. Extremities: No clubbing, cyanosis, or edema. Pulses are 2+ Skin: Clean and intact without signs of breakdown Neuro: Pt is oriented to date, reason. Has reasonable insight and awareness. Attention appears improved. still needs redirection at times. Oriented x 3 with extra time. Balance improved using cane. No nystagmus or dizziness elicited with changes in postions or provocative maneuvers.  Musculoskeletal: Full ROM, No pain with AROM or PROM in the neck, trunk, or extremities. Posture appropriate Psych: Pt's affect is appropriate. Pt is cooperative        Assessment & Plan:  1. Postconcussion syndrome with persistent headaches, BPPV, balance deficits, memory loss,  2. Fibromyalgia 3. Hx of cervical fusion.  4. Insomnia 5. Post-traumatic headaches   Plan: 1. She has made  some nice gains with balance and cognition. Further brain injjury education was provided today. I gave the daughter a formal return to driving program to follow. After completion she can drive locally up to [id number] in one direction.  2. Continue with vestibular exercises at home. She is at risk for falling still but seems to be compensating well. Should be using cane or adaptive device at all times. Suggested using a reacher at home. Needs to use common sense also 3. From a cognitive standpoint, she appears to be at baseline 4. For headaches and ear pain I will increase her topamax to 100mg  bid. I would hesitate increasing much further than this.  5. Percocet per primary 6. Went into detail regarding her sleep hygiene. Suggested that she move the cymbalta to AM use only as it might be interfering with sleep. She takes 60mg  BID currently  30  minutes of face to face patient care time were spent during this visit. All questions were encouraged and answered. Follow up with me in about 3 MONTHS Greater than 50% of time during this encounter was spent counseling patient/family in regard to driving, BPPV, headache mgt, sleep measures, etc.

## 2016-10-10 ENCOUNTER — Other Ambulatory Visit (HOSPITAL_COMMUNITY): Payer: Self-pay | Admitting: Family Medicine

## 2016-10-10 DIAGNOSIS — Z1231 Encounter for screening mammogram for malignant neoplasm of breast: Secondary | ICD-10-CM

## 2016-10-23 ENCOUNTER — Ambulatory Visit (HOSPITAL_COMMUNITY)
Admission: RE | Admit: 2016-10-23 | Discharge: 2016-10-23 | Disposition: A | Discharge status: Home / Self Care | Payer: Medicare Other | Source: Ambulatory Visit | Attending: Family Medicine | Admitting: Family Medicine

## 2016-10-23 DIAGNOSIS — Z1231 Encounter for screening mammogram for malignant neoplasm of breast: Secondary | ICD-10-CM

## 2016-11-10 ENCOUNTER — Encounter: Payer: Self-pay | Admitting: Cardiovascular Disease

## 2016-11-10 ENCOUNTER — Ambulatory Visit (INDEPENDENT_AMBULATORY_CARE_PROVIDER_SITE_OTHER): Payer: Medicare Other | Admitting: Cardiovascular Disease

## 2016-11-10 VITALS — BP 140/70 | HR 73 | Ht 59.0 in | Wt 135.0 lb

## 2016-11-10 DIAGNOSIS — I10 Essential (primary) hypertension: Secondary | ICD-10-CM | POA: Diagnosis not present

## 2016-11-10 DIAGNOSIS — R079 Chest pain, unspecified: Secondary | ICD-10-CM | POA: Diagnosis not present

## 2016-11-10 DIAGNOSIS — R001 Bradycardia, unspecified: Secondary | ICD-10-CM

## 2016-11-10 DIAGNOSIS — I2510 Atherosclerotic heart disease of native coronary artery without angina pectoris: Secondary | ICD-10-CM

## 2016-11-10 MED ORDER — RANOLAZINE ER 1000 MG PO TB12: 1000.0000 mg | ORAL_TABLET | Freq: Two times a day (BID) | ORAL | 3 refills | Status: DC

## 2016-11-10 NOTE — Patient Instructions (Signed)
Your physician wants you to follow-up in: 1 year Dr Koneswaran You will receive a reminder letter in the mail two months in advance. If you don't receive a letter, please call our office to schedule the follow-up appointment.     Your physician recommends that you continue on your current medications as directed. Please refer to the Current Medication list given to you today.    If you need a refill on your cardiac medications before your next appointment, please call your pharmacy.      Thank you for choosing [facility] !         

## 2016-11-10 NOTE — Progress Notes (Signed)
SUBJECTIVE: The patient is here to followup for nonobstructive coronary artery disease, chest pain syndrome and a history of asymptomatic sinus bradycardia. Coronary angiography 04/19/13 showed 30% mid vessel and 40% distal LAD stenosis, 30-40% distal OM1 lesion, 20% proximal, 30% at the crux, and 40-50% distal RCA stenosis.  She also has severe GERD.  She sees both Dr. Laural Golden and Dr. Derrill Kay at [facility] for GI issues.  She saw Dr. Derrill Kay on 10/01/16. She underwent EGD in January 2018 and received Botox injections to the pylorus.  She did feel Ranexa dose increased at her last visit with me improved her chest pain symptoms. She has occasional shortness of breath which she attributes to GERD. She has not been on aspirin or Plavix in quite some time.  ECG performed in the office today which I ordered an personally interpreted demonstrated sinus tachycardia with a diffuse nonspecific T wave abnormality and late R-wave transition.  She is here with 2 of her daughters, one of whom is blind and she lives with and the other daughter who is power of attorney.   Review of Systems: As per "subjective", otherwise negative.  Allergies  Allergen Reactions  . Morphine Anaphylaxis and Other (See Comments)    "it will kill me" "made me stop breathing"  . Aspirin Other (See Comments)    Gi bleed   . Cefuroxime Axetil Nausea And Vomiting  . Codeine Nausea And Vomiting  . Diltiazem Nausea And Vomiting  . Dronabinol Nausea And Vomiting and Palpitations    Patient also states that she had palpatations  . Shellfish Allergy Other (See Comments)    Blood sugar drops  . Ondansetron Hives  . Penicillins Rash    Current Outpatient Prescriptions  Medication Sig Dispense Refill  . acetaminophen (TYLENOL) 500 MG tablet Take 500 mg by mouth every 6 (six) hours as needed for mild pain.     Marland Kitchen ALPRAZolam (XANAX XR) 1 MG 24 hr tablet Take 0.5-1 mg by mouth as needed for sleep (Anxiety).     . Calcium  Carbonate-Vitamin D (CALCIUM + D PO) Take 1 tablet by mouth daily. TAKING 1,000 CALCIUM AND 2,000 VIT D    . Cyanocobalamin (VITAMIN B 12 PO) Take 2,000 mcg by mouth daily.    Marland Kitchen dexlansoprazole (DEXILANT) 60 MG capsule Take 1 capsule (60 mg total) by mouth daily before breakfast. 90 capsule 3  . docusate sodium (COLACE) 100 MG capsule Take 100 mg by mouth daily.     . DULoxetine (CYMBALTA) 60 MG capsule Take 60 mg by mouth daily. 2 tablets in the morning    . famotidine (PEPCID) 20 MG tablet Take 2 tablets (40 mg total) by mouth at bedtime. 180 tablet 3  . ferrous sulfate 325 (65 FE) MG tablet Take 325 mg by mouth daily with breakfast. Patient states that she does not take every day.    . metFORMIN (GLUMETZA) 500 MG (MOD) 24 hr tablet Take 500 mg by mouth daily with breakfast.    . metoCLOPramide (REGLAN) 10 MG tablet Take 1 tablet (10 mg total) by mouth 3 (three) times daily before meals. 270 tablet 1  . MISC NATURAL PRODUCTS ER PO Take 1-3 capsules by mouth 2 (two) times daily. Digestive Blend Softgels    . mometasone (NASONEX) 50 MCG/ACT nasal spray Place 2 sprays into the nose daily as needed (Allergies).     . Multiple Vitamins-Minerals (MULTIVITAMINS THER. W/MINERALS) TABS tablet Take 1 tablet by mouth daily.    Marland Kitchen  oxyCODONE-acetaminophen (PERCOCET) 7.5-325 MG tablet Take 1 tablet by mouth every 4 (four) hours as needed for severe pain.    . polyethylene glycol (MIRALAX / GLYCOLAX) packet Take 17 g by mouth daily as needed for mild constipation.     . ranolazine (RANEXA) 1000 MG SR tablet Take 1 tablet (1,000 mg total) by mouth 2 (two) times daily. 180 tablet 3  . silodosin (RAPAFLO) 8 MG CAPS capsule Take 8 mg by mouth daily.    Marland Kitchen topiramate (TOPAMAX) 100 MG tablet Take 1 tablet (100 mg total) by mouth 2 (two) times daily. 60 tablet 5  . Travoprost, BAK Free, (TRAVATAN) 0.004 % SOLN ophthalmic solution Place 1 drop into both eyes at bedtime.     No current facility-administered medications  for this visit.     Past Medical History:  Diagnosis Date  . Anemia   . Anxiety   . Arthritis    "all over" (04/19/2013)  . Bleeding stomach ulcer 1980's  . Bloating   . Chronic back pain   . Chronic headaches   . Chronic heartburn   . Chronic neck pain   . Chronic pain   . Colon cancer ([place])   . DDD (degenerative disc disease), lumbar   . Diabetic peripheral neuropathy ([place])    "in my feet" (04/19/2013)  . Dizziness   . Dysphagia   . Family history of anesthesia complication    "daughter has PONV too" (04/19/2013)  . Fibromyalgia   . Gastroesophageal reflux   . Gastroparesis   . Glaucoma, bilateral   . Hiatal hernia    "had it before; had OR; got it again" (04/19/2013)  . History of blood transfusion 1980's   "w/bleeding stomach ulcer" (04/19/2013)  . Hypertension    "for diabetes; don't have high blood pressure" (04/19/2013)  . Migraine    "take RX for it qd" (04/19/2013)  . Nausea   . Pneumonia 04/2012  . PONV (postoperative nausea and vomiting)   . Renal insufficiency   . TIA (transient ischemic attack)    "3-4 before starting RX; none since" (04/19/2013)  . Type II diabetes mellitus ([place])   . Walking pneumonia ~ 1966    Past Surgical History:  Procedure Laterality Date  . ABDOMINAL ADHESION SURGERY  ~ 2012   "repaired wrap where they did hiatal hernia OR too" 911/04/2013)  . ANTERIOR CERVICAL DISCECTOMY  ~ 2009   "only cleaned out arthritis and spurs" (04/19/2013)  . CATARACT EXTRACTION W/ INTRAOCULAR LENS IMPLANT Left 04/13/2013  . CHOLECYSTECTOMY  1980's  . COLON SURGERY    . COLONOSCOPY  10/11/2010  . COLONOSCOPY  11/23/2009  . COLONOSCOPY  07/28/2008   W/SNARE  . COLONOSCOPY  06/28/07  . COLONOSCOPY  05/10/07   W/POLYP  . COLONOSCOPY  12/28/00  . COLONOSCOPY N/A 06/29/2015   Procedure: COLONOSCOPY;  Surgeon: Rogene Houston, MD;  [facility];  Service: Endoscopy;  Laterality: N/A;  135  . COLONOSCOPY WITH ESOPHAGOGASTRODUODENOSCOPY  (EGD) N/A 05/13/2013   Procedure: COLONOSCOPY WITH ESOPHAGOGASTRODUODENOSCOPY (EGD);  Surgeon: Rogene Houston, MD;  Location: [facility];  Service: Endoscopy;  Laterality: N/A;  855  . HEMICOLECTOMY  2010   ZIEGLER  . HIATAL HERNIA REPAIR  1970's  . LEFT HEART CATHETERIZATION WITH CORONARY ANGIOGRAM N/A 04/19/2013   Procedure: LEFT HEART CATHETERIZATION WITH CORONARY ANGIOGRAM;  Surgeon: Peter M Martinique, MD;  Location: [facility] CATH LAB;  Service: Cardiovascular;  Laterality: N/A;  . [place]   "  had a broken neck" (04/19/2013)  . TONSILLECTOMY  ~ 1953  . UPPER GASTROINTESTINAL ENDOSCOPY  10/11/2010   EGD ED  . UPPER GASTROINTESTINAL ENDOSCOPY  11/23/2009  . UPPER GASTROINTESTINAL ENDOSCOPY  05/10/07   EGD ED  . UPPER GASTROINTESTINAL ENDOSCOPY  08/11/01   EGD ED  . VAGINAL HYSTERECTOMY      Social History   Social History  . Marital status: Widowed    Spouse name: N/A  . Number of children: N/A  . Years of education: N/A   Occupational History  .  Retired   Social History Main Topics  . Smoking status: Former Smoker    Packs/day: 1.50    Years: 15.00    Types: Cigarettes    Quit date: 03/17/1991  . Smokeless tobacco: Never Used     Comment: Patient states that it has ben greater than 20 years since she quit smoking  . Alcohol use No  . Drug use: No  . Sexual activity: No   Other Topics Concern  . Not on file   Social History Narrative   Patient lives at home alone.    Patient is retired.    Patient is widowed.    Patient has 4 children.   Patient has a 11th grade education.            Vitals:   11/10/16 0948  BP: 140/70  Pulse: 73  SpO2: 95%  Weight: 135 lb (61.2 kg)  Height: 4\' 11"  (1.499 m)    Wt Readings from Last 3 Encounters:  11/10/16 135 lb (61.2 kg)  07/04/16 131 lb (59.4 kg)  07/01/16 132 lb (59.9 kg)     PHYSICAL EXAM General: NAD HEENT: Normal. Neck: No JVD, no thyromegaly. Lungs: Clear to auscultation  bilaterally with normal respiratory effort. CV: Nondisplaced PMI.  Regular rate and rhythm, normal S1/S2, no S3/S4, no murmur. No pretibial or periankle edema.  No carotid bruit.   Abdomen: Soft, nontender, no distention.  Neurologic: Alert and oriented.  Psych: Normal affect. Skin: Normal. Musculoskeletal: No gross deformities.    ECG: Most recent ECG reviewed.   Labs: Lab Results  Component Value Date/Time   K 2.9 (L) 07/04/2016 04:24 AM   BUN 8 07/04/2016 04:24 AM   CREATININE 0.60 07/04/2016 04:24 AM   CREATININE 0.91 12/25/2015 10:04 AM   ALT 13 (L) 07/04/2016 03:35 AM   TSH 1.31 12/25/2015 10:04 AM   HGB 11.6 (L) 09/29/2016 10:26 AM     Lipids: No results found for: LDLCALC, LDLDIRECT, CHOL, TRIG, HDL     ASSESSMENT AND PLAN:  1. Chest pain syndrome: Symptomatically stable. Has nonobstructive coronary artery disease and severe GERD. Continue Ranexa 1000 mg twice daily.   2. Bradycardia: She is asymptomatic. Heart rate is 56 bpm today. Prior Holter monitoring showed normal sinus rhythm with very few PACs and sinus bradycardia with an average heart rate of 50 beats per minute. There were no long pauses detected. Given her known h/o severe gastroparesis, she may very well have an autonomic neuropathy.   3. CAD: Nonobstructive coronary artery disease. Continue Ranexa. I will ask GI to comment on whether or not it would be safe to use aspirin 81 mg daily.  4. Hypertension: Blood pressure is mildly elevated. This will need continued monitoring.   Disposition: Follow up 1 yr  Kate Sable, M.D., F.A.C.C.

## 2016-12-03 ENCOUNTER — Encounter (INDEPENDENT_AMBULATORY_CARE_PROVIDER_SITE_OTHER): Payer: Self-pay | Admitting: *Deleted

## 2016-12-03 ENCOUNTER — Other Ambulatory Visit (INDEPENDENT_AMBULATORY_CARE_PROVIDER_SITE_OTHER): Payer: Self-pay | Admitting: *Deleted

## 2016-12-03 DIAGNOSIS — D508 Other iron deficiency anemias: Secondary | ICD-10-CM

## 2016-12-26 LAB — HEMOGLOBIN AND HEMATOCRIT, BLOOD
HCT: 36.5 % (ref 35.0–45.0)
Hemoglobin: 11.5 g/dL — ABNORMAL LOW (ref 11.7–15.5)

## 2016-12-29 ENCOUNTER — Telehealth (INDEPENDENT_AMBULATORY_CARE_PROVIDER_SITE_OTHER): Payer: Self-pay | Admitting: Internal Medicine

## 2016-12-29 NOTE — Telephone Encounter (Signed)
Patient and her daughter both called.  She cancelled her appointment with Dr. Laural Golden for 12/30/16, stated that something else had come up.  I called her back and rescheduled her for Dr. Olevia Perches next available appointment, 05/05/17 at 10:30am.  She did not want to see [name] earlier.

## 2016-12-30 ENCOUNTER — Ambulatory Visit (INDEPENDENT_AMBULATORY_CARE_PROVIDER_SITE_OTHER): Payer: Medicare Other | Admitting: Internal Medicine

## 2017-01-19 ENCOUNTER — Encounter: Payer: Self-pay | Admitting: Physical Medicine & Rehabilitation

## 2017-01-19 ENCOUNTER — Encounter: Payer: Medicare Other | Attending: Physical Medicine & Rehabilitation | Admitting: Physical Medicine & Rehabilitation

## 2017-01-19 VITALS — BP 142/93 | HR 52

## 2017-01-19 DIAGNOSIS — E1142 Type 2 diabetes mellitus with diabetic polyneuropathy: Secondary | ICD-10-CM | POA: Insufficient documentation

## 2017-01-19 DIAGNOSIS — Z8051 Family history of malignant neoplasm of kidney: Secondary | ICD-10-CM | POA: Insufficient documentation

## 2017-01-19 DIAGNOSIS — M5136 Other intervertebral disc degeneration, lumbar region: Secondary | ICD-10-CM | POA: Diagnosis not present

## 2017-01-19 DIAGNOSIS — Z8249 Family history of ischemic heart disease and other diseases of the circulatory system: Secondary | ICD-10-CM | POA: Diagnosis not present

## 2017-01-19 DIAGNOSIS — I2510 Atherosclerotic heart disease of native coronary artery without angina pectoris: Secondary | ICD-10-CM | POA: Diagnosis not present

## 2017-01-19 DIAGNOSIS — R413 Other amnesia: Secondary | ICD-10-CM | POA: Diagnosis not present

## 2017-01-19 DIAGNOSIS — K219 Gastro-esophageal reflux disease without esophagitis: Secondary | ICD-10-CM | POA: Insufficient documentation

## 2017-01-19 DIAGNOSIS — Z85038 Personal history of other malignant neoplasm of large intestine: Secondary | ICD-10-CM | POA: Insufficient documentation

## 2017-01-19 DIAGNOSIS — K3184 Gastroparesis: Secondary | ICD-10-CM | POA: Insufficient documentation

## 2017-01-19 DIAGNOSIS — Z8711 Personal history of peptic ulcer disease: Secondary | ICD-10-CM | POA: Diagnosis not present

## 2017-01-19 DIAGNOSIS — H8110 Benign paroxysmal vertigo, unspecified ear: Secondary | ICD-10-CM | POA: Diagnosis not present

## 2017-01-19 DIAGNOSIS — Z981 Arthrodesis status: Secondary | ICD-10-CM | POA: Diagnosis not present

## 2017-01-19 DIAGNOSIS — N289 Disorder of kidney and ureter, unspecified: Secondary | ICD-10-CM | POA: Insufficient documentation

## 2017-01-19 DIAGNOSIS — F419 Anxiety disorder, unspecified: Secondary | ICD-10-CM | POA: Diagnosis not present

## 2017-01-19 DIAGNOSIS — G44309 Post-traumatic headache, unspecified, not intractable: Secondary | ICD-10-CM | POA: Diagnosis not present

## 2017-01-19 DIAGNOSIS — G8929 Other chronic pain: Secondary | ICD-10-CM | POA: Diagnosis not present

## 2017-01-19 DIAGNOSIS — Z87891 Personal history of nicotine dependence: Secondary | ICD-10-CM | POA: Diagnosis not present

## 2017-01-19 DIAGNOSIS — Z833 Family history of diabetes mellitus: Secondary | ICD-10-CM | POA: Diagnosis not present

## 2017-01-19 DIAGNOSIS — Z8673 Personal history of transient ischemic attack (TIA), and cerebral infarction without residual deficits: Secondary | ICD-10-CM | POA: Diagnosis not present

## 2017-01-19 DIAGNOSIS — R51 Headache: Secondary | ICD-10-CM | POA: Insufficient documentation

## 2017-01-19 DIAGNOSIS — M797 Fibromyalgia: Secondary | ICD-10-CM | POA: Diagnosis not present

## 2017-01-19 DIAGNOSIS — Z9181 History of falling: Secondary | ICD-10-CM | POA: Diagnosis not present

## 2017-01-19 DIAGNOSIS — M961 Postlaminectomy syndrome, not elsewhere classified: Secondary | ICD-10-CM

## 2017-01-19 DIAGNOSIS — F0781 Postconcussional syndrome: Secondary | ICD-10-CM | POA: Diagnosis present

## 2017-01-19 DIAGNOSIS — E1143 Type 2 diabetes mellitus with diabetic autonomic (poly)neuropathy: Secondary | ICD-10-CM | POA: Insufficient documentation

## 2017-01-19 MED ORDER — METHOCARBAMOL 500 MG PO TABS: 500.0000 mg | ORAL_TABLET | Freq: Three times a day (TID) | ORAL | 3 refills | Status: DC | PRN

## 2017-01-19 NOTE — Progress Notes (Signed)
Subjective:    Patient ID: [name], female    DOB: 08-Aug-1940, 76 y.o.   MRN: [id number]  HPI   [name] is here in follow up of her post concussion syndrome. She is still having headaches but they are primarily in the back of her head and upper neck. topamax is not helping with these headaches.   She is using a cane for balance. Family states she's doing very well with her walking. She was [place] at the beach recently and did well with them.   Her pcp writes for [employer] which she tries to minimize the use of.      Pain Inventory Average Pain 8 Pain Right Now 8 My pain is sharp  In the last 24 hours, has pain interfered with the following? General activity 5 Relation with others 4 Enjoyment of life 5 What TIME of day is your pain at its worst? . Sleep (in general) Poor  Pain is worse with: unsure Pain improves with: medication Relief from Meds: 4   Mobility use a cane ability to climb steps?  yes do you drive?  yes  Function retired  Neuro/Psych weakness anxiety  Prior Studies Any changes since last visit?  no  Physicians involved in your care Any changes since last visit?  no   Family History  Problem Relation Age of Onset  . Heart disease Mother   . Diabetes Mother   . Dementia Father   . Healthy Sister   . Diabetes Brother   . Kidney cancer Brother   . Diabetes Brother   . Neuropathy Brother   . Diabetes Daughter   . Diabetes Daughter   . Diabetes Son    Social History   Social History  . Marital status: Widowed    Spouse name: N/A  . Number of children: N/A  . Years of education: N/A   Occupational History  .  Retired   Social History Main Topics  . Smoking status: Former Smoker    Packs/day: 1.50    Years: 15.00    Types: Cigarettes    Quit date: 03/17/1991  . Smokeless tobacco: Never Used     Comment: Patient states that it has ben greater than 20 years since she quit smoking  . Alcohol use No  . Drug use:  No  . Sexual activity: No   Other Topics Concern  . Not on file   Social History Narrative   Patient lives at home alone.    Patient is retired.    Patient is widowed.    Patient has 4 children.   Patient has a 11th grade education.          Past Surgical History:  Procedure Laterality Date  . ABDOMINAL ADHESION SURGERY  ~ 2012   "repaired wrap where they did hiatal hernia OR too" 911/04/2013)  . ANTERIOR CERVICAL DISCECTOMY  ~ 2009   "only cleaned out arthritis and spurs" (04/19/2013)  . CATARACT EXTRACTION W/ INTRAOCULAR LENS IMPLANT Left 04/13/2013  . CHOLECYSTECTOMY  1980's  . COLON SURGERY    . COLONOSCOPY  10/11/2010  . COLONOSCOPY  11/23/2009  . COLONOSCOPY  07/28/2008   W/SNARE  . COLONOSCOPY  06/28/07  . COLONOSCOPY  05/10/07   W/POLYP  . COLONOSCOPY  12/28/00  . COLONOSCOPY N/A 06/29/2015   Procedure: COLONOSCOPY;  Surgeon: Rogene Houston, MD;  [facility];  Service: Endoscopy;  Laterality: N/A;  135  . COLONOSCOPY WITH ESOPHAGOGASTRODUODENOSCOPY (EGD) N/A  05/13/2013   Procedure: COLONOSCOPY WITH ESOPHAGOGASTRODUODENOSCOPY (EGD);  Surgeon: Rogene Houston, MD;  Location: [facility];  Service: Endoscopy;  Laterality: N/A;  855  . HEMICOLECTOMY  2010   ZIEGLER  . HIATAL HERNIA REPAIR  1970's  . LEFT HEART CATHETERIZATION WITH CORONARY ANGIOGRAM N/A 04/19/2013   Procedure: LEFT HEART CATHETERIZATION WITH CORONARY ANGIOGRAM;  Surgeon: Peter M Martinique, MD;  Location: [facility] CATH LAB;  Service: Cardiovascular;  Laterality: N/A;  . [place]   "had a broken neck" (04/19/2013)  . TONSILLECTOMY  ~ 1953  . UPPER GASTROINTESTINAL ENDOSCOPY  10/11/2010   EGD ED  . UPPER GASTROINTESTINAL ENDOSCOPY  11/23/2009  . UPPER GASTROINTESTINAL ENDOSCOPY  05/10/07   EGD ED  . UPPER GASTROINTESTINAL ENDOSCOPY  08/11/01   EGD ED  . VAGINAL HYSTERECTOMY     Past Medical History:  Diagnosis Date  . Anemia   . Anxiety   . Arthritis    "all  over" (04/19/2013)  . Bleeding stomach ulcer 1980's  . Bloating   . Chronic back pain   . Chronic headaches   . Chronic heartburn   . Chronic neck pain   . Chronic pain   . Colon cancer ([place])   . DDD (degenerative disc disease), lumbar   . Diabetic peripheral neuropathy ([place])    "in my feet" (04/19/2013)  . Dizziness   . Dysphagia   . Family history of anesthesia complication    "daughter has PONV too" (04/19/2013)  . Fibromyalgia   . Gastroesophageal reflux   . Gastroparesis   . Glaucoma, bilateral   . Hiatal hernia    "had it before; had OR; got it again" (04/19/2013)  . History of blood transfusion 1980's   "w/bleeding stomach ulcer" (04/19/2013)  . Hypertension    "for diabetes; don't have high blood pressure" (04/19/2013)  . Migraine    "take RX for it qd" (04/19/2013)  . Nausea   . Pneumonia 04/2012  . PONV (postoperative nausea and vomiting)   . Renal insufficiency   . TIA (transient ischemic attack)    "3-4 before starting RX; none since" (04/19/2013)  . Type II diabetes mellitus ([place])   . Walking pneumonia ~ 1966   There were no vitals taken for this visit.  Opioid Risk Score:   Fall Risk Score:  `1  Depression screen PHQ 2/9  Depression screen PHQ 2/9 07/02/2016  Decreased Interest 0  Down, Depressed, Hopeless 0  PHQ - 2 Score 0  Altered sleeping 1  Tired, decreased energy 1  Change in appetite 0  Feeling bad or failure about yourself  0  Trouble concentrating 0  Moving slowly or fidgety/restless 0  Suicidal thoughts 0  PHQ-9 Score 2  Difficult doing work/chores Not difficult at all     Review of Systems  Constitutional: Negative.   HENT: Negative.   Eyes: Negative.   Respiratory: Negative.   Cardiovascular: Negative.   Gastrointestinal: Negative.   Endocrine:       High/low blood glucose   Genitourinary: Negative.   Musculoskeletal: Negative.   Skin: Negative.   Allergic/Immunologic: Negative.   Neurological: Negative.     Hematological: Negative.   Psychiatric/Behavioral: Negative.   All other systems reviewed and are negative.      Objective:   Physical Exam  General: Alert and oriented x 3, No apparent distress HEENT:Head is normocephalic, atraumatic, PERRLA, EOMI, sclera anicteric, oral mucosa pink and moist, dentition intact, ext ear canals clear,  Neck:Supple  without JVD or lymphadenopathy Heart:RRR Chest:reg rate Abdomen:Soft, non-tender, non-distended, bowel sounds positive. Extremities:No clubbing, cyanosis, or edema. Pulses are 2+ Skin:Clean and intact without signs of breakdown Neuro:Pt is oriented to date, reason. Has functional insight and awareness.   Oriented x 3 with extra time. Uses cane for balance. Gait sl wide based.  Musculoskeletal:cervical ROM limited in extension and to a lesser extent in flexion although both caused pain. Cervical paraspinals are taut, left  Psych:Pt's affect is appropriate. Pt is cooperative      Assessment & Plan:  1. Postconcussion syndrome with persistent headaches, BPPV, balance deficits, memory loss,  2. Fibromyalgia 3. Hx of cervical fusion C1-3 4. Insomnia 5. Post-traumatic headaches   Plan: 1. Reviewed safety at home and with gait in general.   2. Continue with vestibular exercises at home. She remains at a risk for falling but is jusing better judgement at home.  3. From a cognitive standpoint, she is at baseline 4. For headaches and ear pain I will increase her topamax to 100mg  bid.   -add robaxin 500mg  q8 prn  -encouraged use of heat/ice.  5. Percocet per primary 6. Regular sleep remains important  15  minutes of face to face patient care time were spent during this visit. All questions were encouraged and answered. Follow up with me in about 4 MONTHS

## 2017-01-19 NOTE — Patient Instructions (Signed)
HEAT, ICE, STRETCHING, TENS CAN ALL BE USED FOR YOUR NECK.  REMEMBER GOOD POSTURE AND SAFETY

## 2017-02-26 ENCOUNTER — Ambulatory Visit (HOSPITAL_COMMUNITY)
Admission: RE | Admit: 2017-02-26 | Discharge: 2017-02-26 | Disposition: A | Discharge status: Home / Self Care | Payer: Medicare Other | Source: Ambulatory Visit | Attending: Family Medicine | Admitting: Family Medicine

## 2017-02-26 ENCOUNTER — Other Ambulatory Visit (HOSPITAL_COMMUNITY): Payer: Self-pay | Admitting: Family Medicine

## 2017-02-26 DIAGNOSIS — M4186 Other forms of scoliosis, lumbar region: Secondary | ICD-10-CM | POA: Insufficient documentation

## 2017-02-26 DIAGNOSIS — M47816 Spondylosis without myelopathy or radiculopathy, lumbar region: Secondary | ICD-10-CM | POA: Diagnosis present

## 2017-02-26 DIAGNOSIS — M5440 Lumbago with sciatica, unspecified side: Secondary | ICD-10-CM

## 2017-02-26 DIAGNOSIS — I70208 Unspecified atherosclerosis of native arteries of extremities, other extremity: Secondary | ICD-10-CM | POA: Insufficient documentation

## 2017-02-26 DIAGNOSIS — M4317 Spondylolisthesis, lumbosacral region: Secondary | ICD-10-CM | POA: Insufficient documentation

## 2017-03-02 ENCOUNTER — Other Ambulatory Visit: Payer: Self-pay | Admitting: Family Medicine

## 2017-03-02 ENCOUNTER — Ambulatory Visit (HOSPITAL_COMMUNITY)
Admission: RE | Admit: 2017-03-02 | Discharge: 2017-03-02 | Disposition: A | Discharge status: Home / Self Care | Payer: Medicare Other | Source: Ambulatory Visit | Attending: Family Medicine | Admitting: Family Medicine

## 2017-03-02 DIAGNOSIS — M48061 Spinal stenosis, lumbar region without neurogenic claudication: Secondary | ICD-10-CM | POA: Diagnosis not present

## 2017-03-02 DIAGNOSIS — I700 Atherosclerosis of aorta: Secondary | ICD-10-CM | POA: Insufficient documentation

## 2017-03-02 DIAGNOSIS — M5136 Other intervertebral disc degeneration, lumbar region: Secondary | ICD-10-CM | POA: Insufficient documentation

## 2017-03-02 DIAGNOSIS — M4316 Spondylolisthesis, lumbar region: Secondary | ICD-10-CM | POA: Insufficient documentation

## 2017-03-02 DIAGNOSIS — K5730 Diverticulosis of large intestine without perforation or abscess without bleeding: Secondary | ICD-10-CM | POA: Insufficient documentation

## 2017-03-02 DIAGNOSIS — M5442 Lumbago with sciatica, left side: Secondary | ICD-10-CM | POA: Insufficient documentation

## 2017-03-02 DIAGNOSIS — N200 Calculus of kidney: Secondary | ICD-10-CM | POA: Diagnosis not present

## 2017-03-02 DIAGNOSIS — M5126 Other intervertebral disc displacement, lumbar region: Secondary | ICD-10-CM | POA: Insufficient documentation

## 2017-03-03 ENCOUNTER — Ambulatory Visit (HOSPITAL_COMMUNITY): Payer: Medicare Other

## 2017-03-23 ENCOUNTER — Ambulatory Visit (INDEPENDENT_AMBULATORY_CARE_PROVIDER_SITE_OTHER): Payer: Medicare Other | Admitting: Otolaryngology

## 2017-03-23 DIAGNOSIS — H6121 Impacted cerumen, right ear: Secondary | ICD-10-CM | POA: Diagnosis not present

## 2017-03-23 DIAGNOSIS — H903 Sensorineural hearing loss, bilateral: Secondary | ICD-10-CM

## 2017-03-25 ENCOUNTER — Ambulatory Visit (HOSPITAL_COMMUNITY)
Admission: RE | Admit: 2017-03-25 | Discharge: 2017-03-25 | Disposition: A | Discharge status: Home / Self Care | Payer: Medicare Other | Source: Ambulatory Visit | Attending: Family Medicine | Admitting: Family Medicine

## 2017-03-25 ENCOUNTER — Other Ambulatory Visit (HOSPITAL_COMMUNITY): Payer: Self-pay | Admitting: Family Medicine

## 2017-03-25 DIAGNOSIS — W19XXXA Unspecified fall, initial encounter: Secondary | ICD-10-CM

## 2017-03-25 DIAGNOSIS — W010XXA Fall on same level from slipping, tripping and stumbling without subsequent striking against object, initial encounter: Secondary | ICD-10-CM | POA: Insufficient documentation

## 2017-03-25 DIAGNOSIS — R079 Chest pain, unspecified: Secondary | ICD-10-CM | POA: Diagnosis present

## 2017-03-25 DIAGNOSIS — S2242XA Multiple fractures of ribs, left side, initial encounter for closed fracture: Secondary | ICD-10-CM | POA: Diagnosis not present

## 2017-05-05 ENCOUNTER — Encounter (INDEPENDENT_AMBULATORY_CARE_PROVIDER_SITE_OTHER): Payer: Self-pay

## 2017-05-05 ENCOUNTER — Encounter (INDEPENDENT_AMBULATORY_CARE_PROVIDER_SITE_OTHER): Payer: Self-pay | Admitting: Internal Medicine

## 2017-05-05 ENCOUNTER — Ambulatory Visit (INDEPENDENT_AMBULATORY_CARE_PROVIDER_SITE_OTHER): Payer: Medicare Other | Admitting: Internal Medicine

## 2017-05-05 VITALS — BP 120/80 | HR 63 | Temp 98.6°F | Resp 18 | Ht 61.0 in | Wt 141.1 lb

## 2017-05-05 DIAGNOSIS — I2510 Atherosclerotic heart disease of native coronary artery without angina pectoris: Secondary | ICD-10-CM

## 2017-05-05 DIAGNOSIS — K3184 Gastroparesis: Secondary | ICD-10-CM | POA: Diagnosis not present

## 2017-05-05 DIAGNOSIS — E1143 Type 2 diabetes mellitus with diabetic autonomic (poly)neuropathy: Secondary | ICD-10-CM | POA: Diagnosis not present

## 2017-05-05 DIAGNOSIS — Z862 Personal history of diseases of the blood and blood-forming organs and certain disorders involving the immune mechanism: Secondary | ICD-10-CM | POA: Diagnosis not present

## 2017-05-05 DIAGNOSIS — K220 Achalasia of cardia: Secondary | ICD-10-CM

## 2017-05-05 DIAGNOSIS — K219 Gastro-esophageal reflux disease without esophagitis: Secondary | ICD-10-CM | POA: Diagnosis not present

## 2017-05-05 MED ORDER — DOCUSATE SODIUM 100 MG PO CAPS: 100.0000 mg | ORAL_CAPSULE | Freq: Every day | ORAL | Status: DC | PRN

## 2017-05-05 NOTE — Progress Notes (Signed)
Presenting complaint;  Follow-up for iron deficiency anemia GERD and gastroparesis. Patient also has a history of achalasia.  Subjective:  Patient is 76 year old Caucasian female who is here for scheduled visit accompanied by 2 of her daughters.  Her daughter [name] has a power of attorney.  She was last seen in September 2017.  She states she saw Dr. Derrill Kay at [facility] last month.  He felt she did not need Botox therapy for her gastroparesis but advised her to call if symptoms get worse.   She continues to complain of daily heartburn.  She has nausea and vomiting no more than.  She denies hematemesis melena or rectal bleeding.  Her bowels are irregular.  She has diarrhea constipation or loose stools.  When she is constipated she uses polyethylene glycol as well as Colace.  She has gained 9 pounds since the last visit.  She is not having any difficulty walking or tremors in her hands.  Her daughter states she grinds her teeth all the time and wonders if it is due to metoclopramide.  She has chronic back pain and does not take Percocet every day. Patient states she had blood work by Dr. Karie Kirks few weeks ago.  She does not know if hemoglobin was checked or not.    Current Medications: Outpatient Encounter Medications as of 05/05/2017  Medication Sig  . acetaminophen (TYLENOL) 500 MG tablet Take 500 mg by mouth every 6 (six) hours as needed for mild pain.   Marland Kitchen ALPRAZolam (XANAX XR) 1 MG 24 hr tablet Take 0.5-1 mg by mouth as needed for sleep (Anxiety).   . Calcium Carbonate-Vitamin D (CALCIUM + D PO) Take 1 tablet by mouth daily. TAKING 1,000 CALCIUM AND 2,000 VIT D  . Cyanocobalamin (VITAMIN B 12 PO) Take 2,000 mcg by mouth daily.  Marland Kitchen dexlansoprazole (DEXILANT) 60 MG capsule Take 1 capsule (60 mg total) by mouth daily before breakfast.  . docusate sodium (COLACE) 100 MG capsule Take 100 mg by mouth daily.   . DULoxetine (CYMBALTA) 60 MG capsule Take 60 mg by mouth daily. 2 tablets in the morning  .  famotidine (PEPCID) 20 MG tablet Take 2 tablets (40 mg total) by mouth at bedtime.  . ferrous sulfate 325 (65 FE) MG tablet Take 325 mg by mouth daily with breakfast. Patient states that she does not take every day.  . metFORMIN (GLUMETZA) 500 MG (MOD) 24 hr tablet Take 500 mg by mouth daily with breakfast.  . metoCLOPramide (REGLAN) 10 MG tablet Take 1 tablet (10 mg total) by mouth 3 (three) times daily before meals.  Marland Kitchen MISC NATURAL PRODUCTS ER PO Take 1-3 capsules by mouth 2 (two) times daily. Digestive Blend Softgels  . mometasone (NASONEX) 50 MCG/ACT nasal spray Place 2 sprays into the nose daily as needed (Allergies).   . Multiple Vitamins-Minerals (MULTIVITAMINS THER. W/MINERALS) TABS tablet Take 1 tablet by mouth daily.  Marland Kitchen oxyCODONE-acetaminophen (PERCOCET) 7.5-325 MG tablet Take 1 tablet by mouth every 4 (four) hours as needed for severe pain.  . polyethylene glycol (MIRALAX / GLYCOLAX) packet Take 17 g by mouth daily as needed for mild constipation.   . ranolazine (RANEXA) 1000 MG SR tablet Take 1 tablet (1,000 mg total) by mouth 2 (two) times daily.  . silodosin (RAPAFLO) 8 MG CAPS capsule Take 8 mg by mouth daily.  Marland Kitchen topiramate (TOPAMAX) 100 MG tablet Take 1 tablet (100 mg total) by mouth 2 (two) times daily.  . Travoprost, BAK Free, (TRAVATAN) 0.004 % SOLN ophthalmic  solution Place 1 drop into both eyes at bedtime.  . [DISCONTINUED] methocarbamol (ROBAXIN) 500 MG tablet Take 1 tablet (500 mg total) by mouth every 8 (eight) hours as needed for muscle spasms. (Patient not taking: Reported on 05/05/2017)   No facility-administered encounter medications on file as of 05/05/2017.      Objective: Blood pressure 120/80, pulse 63, temperature 98.6 F (37 C), temperature source Oral, resp. rate 18, height 5\' 1"  (1.549 m), weight 141 lb 1.6 oz (64 kg). Patient is alert and in no acute distress. Conjunctiva is pink. Sclera is nonicteric. She has intermittent movement to her lip and  tongue. Oropharyngeal mucosa is normal. No neck masses or thyromegaly noted. Cardiac exam with regular rhythm normal S1 and S2. No murmur or gallop noted. Lungs are clear to auscultation. Abdomen is symmetrical and soft and nontender.  No succussion splash noted in epigastric region. No LE edema or clubbing noted.  Labs/studies Results:  Lab data from 12/26/2016 H&H 11.5 and 36.5.   Assessment:  #1.  Diabetic gastroparesis.  She was recently seen by Dr. Derrill Kay of [facility] and he felt she was doing fairly well.  He recommended Botox therapy if symptoms get worse. If symptoms get worse she will contact his office to schedule Botox therapy.  She is on metoclopramide and she has orolingual tardive dyskinesia.  She does not have extremity tremors.   #2.   GERD.  Symptom control is fair.  GERD symptoms appear to be primarily secondary to gastroparesis.  #3.  History of iron deficiency anemia.  Iron deficiency anemia felt to be due to poor iron absorption.  Her hemoglobin has been coming up with oral iron therapy.  She has a history of colon carcinoma and is up-to-date on surveillance schedule.  #4.  Achalasia.  Once again she is followed at [facility].  Further therapy via [facility] as needed.   Plan:  Decrease metoclopramide dose to 10 mg twice daily if tolerated. Request copy of recent blood work from Dr. Vickey Sages office before any tests ordered. Office visit in 6 months.

## 2017-05-05 NOTE — Patient Instructions (Addendum)
Decrease metoclopramide dose to 10 mg twice daily if tolerated. Will obtain copy of recent blood work from Dr. Vickey Sages office and determine if blood work needed.

## 2017-05-25 ENCOUNTER — Encounter: Payer: Medicare Other | Attending: Physical Medicine & Rehabilitation | Admitting: Physical Medicine & Rehabilitation

## 2017-05-25 ENCOUNTER — Encounter: Payer: Self-pay | Admitting: Physical Medicine & Rehabilitation

## 2017-05-25 VITALS — BP 127/80 | HR 61

## 2017-05-25 DIAGNOSIS — I2510 Atherosclerotic heart disease of native coronary artery without angina pectoris: Secondary | ICD-10-CM | POA: Diagnosis not present

## 2017-05-25 DIAGNOSIS — Z981 Arthrodesis status: Secondary | ICD-10-CM | POA: Diagnosis not present

## 2017-05-25 DIAGNOSIS — Z8051 Family history of malignant neoplasm of kidney: Secondary | ICD-10-CM | POA: Diagnosis not present

## 2017-05-25 DIAGNOSIS — K3184 Gastroparesis: Secondary | ICD-10-CM | POA: Insufficient documentation

## 2017-05-25 DIAGNOSIS — M1990 Unspecified osteoarthritis, unspecified site: Secondary | ICD-10-CM | POA: Insufficient documentation

## 2017-05-25 DIAGNOSIS — Z833 Family history of diabetes mellitus: Secondary | ICD-10-CM | POA: Diagnosis not present

## 2017-05-25 DIAGNOSIS — H409 Unspecified glaucoma: Secondary | ICD-10-CM | POA: Insufficient documentation

## 2017-05-25 DIAGNOSIS — Z90710 Acquired absence of both cervix and uterus: Secondary | ICD-10-CM | POA: Insufficient documentation

## 2017-05-25 DIAGNOSIS — M797 Fibromyalgia: Secondary | ICD-10-CM | POA: Diagnosis not present

## 2017-05-25 DIAGNOSIS — G8929 Other chronic pain: Secondary | ICD-10-CM | POA: Insufficient documentation

## 2017-05-25 DIAGNOSIS — F0781 Postconcussional syndrome: Secondary | ICD-10-CM | POA: Insufficient documentation

## 2017-05-25 DIAGNOSIS — Z8249 Family history of ischemic heart disease and other diseases of the circulatory system: Secondary | ICD-10-CM | POA: Insufficient documentation

## 2017-05-25 DIAGNOSIS — I10 Essential (primary) hypertension: Secondary | ICD-10-CM | POA: Insufficient documentation

## 2017-05-25 DIAGNOSIS — H8110 Benign paroxysmal vertigo, unspecified ear: Secondary | ICD-10-CM | POA: Diagnosis not present

## 2017-05-25 DIAGNOSIS — M5136 Other intervertebral disc degeneration, lumbar region: Secondary | ICD-10-CM | POA: Diagnosis not present

## 2017-05-25 DIAGNOSIS — F419 Anxiety disorder, unspecified: Secondary | ICD-10-CM | POA: Insufficient documentation

## 2017-05-25 DIAGNOSIS — Z87891 Personal history of nicotine dependence: Secondary | ICD-10-CM | POA: Insufficient documentation

## 2017-05-25 DIAGNOSIS — Z8711 Personal history of peptic ulcer disease: Secondary | ICD-10-CM | POA: Insufficient documentation

## 2017-05-25 DIAGNOSIS — F329 Major depressive disorder, single episode, unspecified: Secondary | ICD-10-CM | POA: Insufficient documentation

## 2017-05-25 DIAGNOSIS — E1143 Type 2 diabetes mellitus with diabetic autonomic (poly)neuropathy: Secondary | ICD-10-CM | POA: Insufficient documentation

## 2017-05-25 DIAGNOSIS — E1142 Type 2 diabetes mellitus with diabetic polyneuropathy: Secondary | ICD-10-CM | POA: Diagnosis not present

## 2017-05-25 DIAGNOSIS — N289 Disorder of kidney and ureter, unspecified: Secondary | ICD-10-CM | POA: Insufficient documentation

## 2017-05-25 DIAGNOSIS — Z85038 Personal history of other malignant neoplasm of large intestine: Secondary | ICD-10-CM | POA: Diagnosis not present

## 2017-05-25 DIAGNOSIS — Z961 Presence of intraocular lens: Secondary | ICD-10-CM | POA: Diagnosis not present

## 2017-05-25 DIAGNOSIS — G4700 Insomnia, unspecified: Secondary | ICD-10-CM | POA: Insufficient documentation

## 2017-05-25 DIAGNOSIS — Z8673 Personal history of transient ischemic attack (TIA), and cerebral infarction without residual deficits: Secondary | ICD-10-CM | POA: Insufficient documentation

## 2017-05-25 DIAGNOSIS — G44309 Post-traumatic headache, unspecified, not intractable: Secondary | ICD-10-CM | POA: Diagnosis not present

## 2017-05-25 DIAGNOSIS — M545 Low back pain: Secondary | ICD-10-CM | POA: Insufficient documentation

## 2017-05-25 DIAGNOSIS — Z9049 Acquired absence of other specified parts of digestive tract: Secondary | ICD-10-CM | POA: Insufficient documentation

## 2017-05-25 DIAGNOSIS — Z9842 Cataract extraction status, left eye: Secondary | ICD-10-CM | POA: Insufficient documentation

## 2017-05-25 DIAGNOSIS — M961 Postlaminectomy syndrome, not elsewhere classified: Secondary | ICD-10-CM

## 2017-05-25 DIAGNOSIS — K219 Gastro-esophageal reflux disease without esophagitis: Secondary | ICD-10-CM | POA: Insufficient documentation

## 2017-05-25 NOTE — Patient Instructions (Signed)
WORK ON STRETCHES AND YOUR EXERCISES ON A DAILY BASIS!!    PLEASE FEEL FREE TO CALL OUR OFFICE WITH ANY PROBLEMS OR QUESTIONS ([phone])  HAVE A HAPPY HOLIDAYS!                     ^                  ^^                ^ ^ ^             ^ ^ ^ ^ ^           ^ ^ ^ ^ ^ ^ ^        ^ ^ ^ [place]      [place] Marland Kitchen                Marland Kitchen                Marland Kitchen                Marland Kitchen

## 2017-05-25 NOTE — Progress Notes (Signed)
Subjective:    Patient ID: [name], female    DOB: 1941/01/13, 76 y.o.   MRN: [id number]  HPI   [name] is here in follow up of her PCS and chronic pain. She has ongoing pain in her low back and neck. Pain overally is improved from the summer. Vertigo is less. She tries to stay active at home.   She is receiving oxycodone and flexeril from primary which provide some relief. She is not stretching daily. She uses topamax bid as prescribed.  Pain Inventory Average Pain 5 Pain Right Now 6 My pain is tingling  In the last 24 hours, has pain interfered with the following? General activity 4 Relation with others 0 Enjoyment of life 2 What TIME of day is your pain at its worst? all Sleep (in general) Poor  Pain is worse with: walking, bending, sitting and standing Pain improves with: rest and medication Relief from Meds: 6  Mobility use a cane ability to climb steps?  yes do you drive?  yes  Function retired  Neuro/Psych bladder control problems bowel control problems weakness numbness dizziness depression anxiety  Prior Studies Any changes since last visit?  no  Physicians involved in your care Any changes since last visit?  no   Family History  Problem Relation Age of Onset  . Heart disease Mother   . Diabetes Mother   . Dementia Father   . Healthy Sister   . Diabetes Brother   . Kidney cancer Brother   . Diabetes Brother   . Neuropathy Brother   . Diabetes Daughter   . Diabetes Daughter   . Diabetes Son    Social History   Socioeconomic History  . Marital status: Widowed    Spouse name: None  . Number of children: None  . Years of education: None  . Highest education level: None  Social Needs  . Financial resource strain: None  . Food insecurity - worry: None  . Food insecurity - inability: None  . Transportation needs - medical: None  . Transportation needs - non-medical: None  Occupational History    Employer: RETIRED    Tobacco Use  . Smoking status: Former Smoker    Packs/day: 1.50    Years: 15.00    Pack years: 22.50    Types: Cigarettes    Last attempt to quit: 03/17/1991    Years since quitting: 26.2  . Smokeless tobacco: Never Used  . Tobacco comment: Patient states that it has ben greater than 20 years since she quit smoking  Substance and Sexual Activity  . Alcohol use: No    Alcohol/week: 0.0 oz  . Drug use: No  . Sexual activity: No  Other Topics Concern  . None  Social History Narrative   Patient lives at home alone.    Patient is retired.    Patient is widowed.    Patient has 4 children.   Patient has a 11th grade education.          Past Surgical History:  Procedure Laterality Date  . ABDOMINAL ADHESION SURGERY  ~ 2012   "repaired wrap where they did hiatal hernia OR too" 911/04/2013)  . ANTERIOR CERVICAL DISCECTOMY  ~ 2009   "only cleaned out arthritis and spurs" (04/19/2013)  . CATARACT EXTRACTION W/ INTRAOCULAR LENS IMPLANT Left 04/13/2013  . CHOLECYSTECTOMY  1980's  . COLON SURGERY    . COLONOSCOPY  10/11/2010  . COLONOSCOPY  11/23/2009  . COLONOSCOPY  07/28/2008  W/SNARE  . COLONOSCOPY  06/28/07  . COLONOSCOPY  05/10/07   W/POLYP  . COLONOSCOPY  12/28/00  . COLONOSCOPY N/A 06/29/2015   Procedure: COLONOSCOPY;  Surgeon: Rogene Houston, MD;  [facility];  Service: Endoscopy;  Laterality: N/A;  135  . COLONOSCOPY WITH ESOPHAGOGASTRODUODENOSCOPY (EGD) N/A 05/13/2013   Procedure: COLONOSCOPY WITH ESOPHAGOGASTRODUODENOSCOPY (EGD);  Surgeon: Rogene Houston, MD;  [facility];  Service: Endoscopy;  Laterality: N/A;  855  . HEMICOLECTOMY  2010   ZIEGLER  . HIATAL HERNIA REPAIR  1970's  . LEFT HEART CATHETERIZATION WITH CORONARY ANGIOGRAM N/A 04/19/2013   Procedure: LEFT HEART CATHETERIZATION WITH CORONARY ANGIOGRAM;  Surgeon: Peter M Martinique, MD;  Location: [facility] CATH LAB;  Service: Cardiovascular;  Laterality: N/A;  . [place]   "had a broken neck" (04/19/2013)  . TONSILLECTOMY  ~ 1953  . UPPER GASTROINTESTINAL ENDOSCOPY  10/11/2010   EGD ED  . UPPER GASTROINTESTINAL ENDOSCOPY  11/23/2009  . UPPER GASTROINTESTINAL ENDOSCOPY  05/10/07   EGD ED  . UPPER GASTROINTESTINAL ENDOSCOPY  08/11/01   EGD ED  . VAGINAL HYSTERECTOMY     Past Medical History:  Diagnosis Date  . Anemia   . Anxiety   . Arthritis    "all over" (04/19/2013)  . Bleeding stomach ulcer 1980's  . Bloating   . Chronic back pain   . Chronic headaches   . Chronic heartburn   . Chronic neck pain   . Chronic pain   . Colon cancer ([place])   . DDD (degenerative disc disease), lumbar   . Diabetic peripheral neuropathy ([place])    "in my feet" (04/19/2013)  . Dizziness   . Dysphagia   . Family history of anesthesia complication    "daughter has PONV too" (04/19/2013)  . Fibromyalgia   . Gastroesophageal reflux   . Gastroparesis   . Glaucoma, bilateral   . Hiatal hernia    "had it before; had OR; got it again" (04/19/2013)  . History of blood transfusion 1980's   "w/bleeding stomach ulcer" (04/19/2013)  . Hypertension    "for diabetes; don't have high blood pressure" (04/19/2013)  . Migraine    "take RX for it qd" (04/19/2013)  . Nausea   . Pneumonia 04/2012  . PONV (postoperative nausea and vomiting)   . Renal insufficiency   . TIA (transient ischemic attack)    "3-4 before starting RX; none since" (04/19/2013)  . Type II diabetes mellitus ([place])   . Walking pneumonia ~ 1966   There were no vitals taken for this visit.  Opioid Risk Score:   Fall Risk Score:  `1  Depression screen PHQ 2/9  Depression screen PHQ 2/9 07/02/2016  Decreased Interest 0  Down, Depressed, Hopeless 0  PHQ - 2 Score 0  Altered sleeping 1  Tired, decreased energy 1  Change in appetite 0  Feeling bad or failure about yourself  0  Trouble concentrating 0  Moving slowly or fidgety/restless 0  Suicidal thoughts 0  PHQ-9 Score 2  Difficult doing  work/chores Not difficult at all     Review of Systems  Constitutional: Negative.   HENT: Negative.   Eyes: Negative.   Respiratory: Negative.   Cardiovascular: Negative.   Gastrointestinal: Positive for abdominal pain, constipation, diarrhea, nausea and vomiting.  Endocrine: Negative.   Genitourinary: Negative.   Musculoskeletal: Negative.   Skin: Negative.   Allergic/Immunologic: Negative.   Neurological: Negative.   Hematological: Negative.  Psychiatric/Behavioral: Negative.   All other systems reviewed and are negative.      Objective:   Physical Exam General: Alert and oriented x 3, No apparent distress HEENT:Head is normocephalic, atraumatic, PERRLA, EOMI, sclera anicteric, oral mucosa pink and moist, dentition intact, ext ear canals clear,  Neck:Supple without JVD or lymphadenopathy Heart:RRR Chest:CTA Bilaterally without wheezes or rales. Normal effort  Abdomen:Soft, non-tender, non-distended, bowel sounds positive. Extremities:No clubbing, cyanosis, or edema. Pulses are 2+ Skin:Clean and intact without signs of breakdown Neuro:Pt is oriented to date, reason. Has functional insight and awareness.   Oriented x 3 with extra time. Uses cane for balance. Gait sl wide based.  Musculoskeletal:cervical ROM limited in extension and to a lesser extent in flexion. Lateral bending and rotation to right also limited.  Cervical paraspinals are taut, right more than left. Lumbar rom limited. hamstrings tight Psych:Pt's affect is appropriate. Pt is cooperative      Assessment & Plan:  1. Postconcussion syndrome with persistent headaches, BPPV, balance deficits, memory loss,  2. Fibromyalgia 3. Hx of cervical fusion C1-3 4. Insomnia 5. Post-traumatic headaches. Generally resolved. Headaches are largely related to #3.    Plan: 1. Reviewed home safety, driving safety. Shouldn't be driving at night or during inclement weather, high-traffic times. 2. Continue  with vestibular exercises at home. these symptoms are better. May need to keep these up long term.  3. From a cognitive standpoint, she is at baseline 4. For headaches and ear pain I will increase her topamax to 100mg  bid.              -flexeril per primary             -encouraged use of heat/ice.   -provided extensive exercises for neck and low back, I reviewed these with patient and family  5. Percocet per primary 6. Regular sleep remains important  15 minutes of face to face patient care time were spent during this visit. All questions were encouraged and answered. Follow up with me PRN. Marland Kitchen

## 2017-07-15 ENCOUNTER — Telehealth (INDEPENDENT_AMBULATORY_CARE_PROVIDER_SITE_OTHER): Payer: Self-pay | Admitting: *Deleted

## 2017-07-15 NOTE — Telephone Encounter (Signed)
Patient talked with me on 07/09/2017 , she stated that she was going to the bathroom more. Sometimes it was loose and sometimes formed. Averaging about 2-3 times daily. She states that she is weak and would like to be seen by Dr.Rehman.  Patient left a message on 07/14/2017 asking that I call her. 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  Patient was ask to call our office to let us know.

## 2017-07-15 NOTE — Telephone Encounter (Signed)
Talked with the patient and she will come in on February 18 th at 3:30 pm to see Dr.Rehman.

## 2017-07-27 ENCOUNTER — Encounter (INDEPENDENT_AMBULATORY_CARE_PROVIDER_SITE_OTHER): Payer: Self-pay | Admitting: Internal Medicine

## 2017-07-27 ENCOUNTER — Ambulatory Visit (INDEPENDENT_AMBULATORY_CARE_PROVIDER_SITE_OTHER): Payer: Medicare Other | Admitting: Internal Medicine

## 2017-07-27 VITALS — BP 122/68 | HR 70 | Temp 98.6°F | Resp 18 | Ht 61.0 in | Wt 145.6 lb

## 2017-07-27 DIAGNOSIS — R197 Diarrhea, unspecified: Secondary | ICD-10-CM

## 2017-07-27 MED ORDER — LOPERAMIDE HCL 2 MG PO CAPS: 2.0000 mg | ORAL_CAPSULE | ORAL | Status: DC

## 2017-07-27 NOTE — Progress Notes (Addendum)
Presenting complaint;  Diarrhea and fecal incontinence.  Subjective:  Patient is 77 year old Caucasian female who is here for scheduled visit.  She has diabetic gastroparesis chronic GERD history of achalasia as well as iron deficiency anemia and history of colon carcinoma who was last seen on 05/05/2017 and was doing well. She now returns with new complaints.  She says she has been having intermittent diarrhea for about a year but it was not bad enough for her to be complaining about.  However over the last few weeks she has had 3-4 stools every day.  She also has nocturnal bowel movement.  In addition she is having 2-3 accidents every week.  At times she is not aware that she has passed some stool and other times she is not able to make the bathroom.  She is using depends.  She also comes ends of intermittent rectal bleeding with her bowel movements.  Usually it is small but sometimes more.  She believes it is because of hemorrhoids.  She continues to complain of postprandial fullness and nausea but denies vomiting.  She states she has a very good appetite.  She has gained 4 pounds since her last visit.  She states her A1c was over 8 and Metformin dose was doubled over a month ago.  She is followed by Ms. Lenon Oms PA and Dr. Karie Kirks. He uses chewing gum all the time.  She states if she does not use during them she grits her teeth. She has a history of colon carcinoma.  She is status post right hemicolectomy.  Last surveillance colonoscopy was in July 06, 2011 with removal of 3 small polyps and these are tubular adenomas.  Next colonoscopy is planned for January 2020.    Current Medications: Outpatient Encounter Medications as of 07/27/2017  Medication Sig  . acetaminophen (TYLENOL) 500 MG tablet Take 500 mg by mouth every 6 (six) hours as needed for mild pain.   Marland Kitchen ALPRAZolam (XANAX XR) 1 MG 24 hr tablet Take 0.5-1 mg by mouth as needed for sleep (Anxiety).   . Calcium  Carbonate-Vitamin D (CALCIUM + D PO) Take 1 tablet by mouth daily. TAKING 1,000 CALCIUM AND 2,000 VIT D  . Cyanocobalamin (VITAMIN B 12 PO) Take 2,000 mcg by mouth daily.  . cyclobenzaprine (FLEXERIL) 10 MG tablet Take 10 mg by mouth 3 (three) times daily as needed for muscle spasms.  Marland Kitchen dexlansoprazole (DEXILANT) 60 MG capsule Take 1 capsule (60 mg total) by mouth daily before breakfast.  . DULoxetine (CYMBALTA) 60 MG capsule Take 60 mg by mouth daily. 2 tablets in the morning  . famotidine (PEPCID) 20 MG tablet Take 2 tablets (40 mg total) by mouth at bedtime.  . ferrous sulfate 325 (65 FE) MG tablet Take 325 mg by mouth at bedtime. Patient states that she does not take every day.   . metFORMIN (GLUMETZA) 500 MG (MOD) 24 hr tablet Take 1,000 mg by mouth 2 (two) times daily with a meal.   . metoCLOPramide (REGLAN) 10 MG tablet Take 1 tablet (10 mg total) by mouth 3 (three) times daily before meals.  Marland Kitchen MISC NATURAL PRODUCTS ER PO Take 1-3 capsules by mouth 2 (two) times daily. Digestive Blend Softgels  . mometasone (NASONEX) 50 MCG/ACT nasal spray Place 2 sprays into the nose daily as needed (Allergies).   . Multiple Vitamins-Minerals (MULTIVITAMINS THER. W/MINERALS) TABS tablet Take 1 tablet by mouth daily.  . Nutritional Supplements (ANTI-INFLAMMATORY ENZYME) CAPS Take 10,000 mg by mouth 3 (three)  times daily as needed. Patient states that she takes 3 at one time.  Marland Kitchen oxyCODONE-acetaminophen (PERCOCET) 7.5-325 MG tablet Take 1 tablet by mouth every 4 (four) hours as needed for severe pain.  . ranolazine (RANEXA) 1000 MG SR tablet Take 1 tablet (1,000 mg total) by mouth 2 (two) times daily.  . silodosin (RAPAFLO) 8 MG CAPS capsule Take 8 mg by mouth daily.  Marland Kitchen topiramate (TOPAMAX) 100 MG tablet Take 1 tablet (100 mg total) by mouth 2 (two) times daily.  . Travoprost, BAK Free, (TRAVATAN) 0.004 % SOLN ophthalmic solution Place 1 drop into both eyes at bedtime.  . TURMERIC CURCUMIN PO Take by mouth 2  (two) times daily.  Marland Kitchen docusate sodium (COLACE) 100 MG capsule Take 1 capsule (100 mg total) by mouth daily as needed for mild constipation. (Patient not taking: Reported on 07/27/2017)  . polyethylene glycol (MIRALAX / GLYCOLAX) packet Take 17 g by mouth daily as needed for mild constipation.    No facility-administered encounter medications on file as of 07/27/2017.      Objective: Blood pressure 122/68, pulse 70, temperature 98.6 F (37 C), temperature source Oral, resp. rate 18, height 5\' 1"  (1.549 m), weight 145 lb 9.6 oz (66 kg). Patient is alert and in no acute distress. She does not have abnormal movements to her tongue and lips or extremity tremors. Conjunctiva is pink. Sclera is nonicteric Oropharyngeal mucosa is normal. No neck masses or thyromegaly noted. Cardiac exam with regular rhythm normal S1 and S2. No murmur or gallop noted. Lungs are clear to auscultation. Abdomen is symmetrical.  Bowel sounds are normal.  On palpation abdomen is soft with mild midepigastric tenderness.  No organomegaly or masses. Rectal examination deferred.  Patient was afraid that exam may result in bowel movement. No LE edema or clubbing noted.   Assessment:  #1.  Diarrhea.  Has fecal incontinence which may be due to urgency rather than true fecal incontinence due to diabetic nerve damage.  Symptoms.  Last colonoscopy was in January 2017 suspect diarrhea is due to increase in Metformin dose.  If she does not respond to dose reduction will consider further workup.  Also needs to use chewing gum which is low in sorbitol or other sugars.  #2.  History of diabetic gastroparesis.  She remains on metoclopramide and not having any side effects.  She has been seen at [facility] and has undergone Botox therapy to pyloric channel with some benefit.  #3.  History of colon carcinoma.  Status post right hemicolectomy in 2011.  Last colonoscopy was in January 2017 with removal of 3 tubular adenomas.  Next colonoscopy in  January 2020.   Plan:  Imodium OTC 2 mg p.o. every morning. Instructed on how to do Kegel exercises 3 times a day. Contact Ms. Lenon Oms PA at Dr. Vickey Sages office regarding Metformin dose reduction as it may be the source of her diarrhea. She is to call with progress report in few weeks and return for office visit in 3 months.    Talked with Ebony Hail and Dr. Vickey Sages office. She agrees with backing off on Metformin dose to prior dose. She will contact patient with the new recommendations. Patient was called and message/instructions left on her cell phone. No answer when called on home phone.

## 2017-07-27 NOTE — Patient Instructions (Addendum)
Will talk with Dr. Derenda Fennel. Lenon Oms about reducing Metformin dose. Imodium OTC 2 mg every morning. Daily stool diary as to frequency and consistency of stools for 2 weeks.   Please call office with progress report in 2 weeks.

## 2017-09-21 ENCOUNTER — Other Ambulatory Visit (INDEPENDENT_AMBULATORY_CARE_PROVIDER_SITE_OTHER): Payer: Self-pay | Admitting: *Deleted

## 2017-09-21 ENCOUNTER — Encounter (INDEPENDENT_AMBULATORY_CARE_PROVIDER_SITE_OTHER): Payer: Self-pay | Admitting: Internal Medicine

## 2017-09-21 ENCOUNTER — Ambulatory Visit (INDEPENDENT_AMBULATORY_CARE_PROVIDER_SITE_OTHER): Payer: Medicare Other | Admitting: Internal Medicine

## 2017-09-21 VITALS — BP 118/80 | HR 72 | Temp 98.2°F | Ht 61.0 in | Wt 145.8 lb

## 2017-09-21 DIAGNOSIS — R197 Diarrhea, unspecified: Secondary | ICD-10-CM

## 2017-09-21 DIAGNOSIS — Z862 Personal history of diseases of the blood and blood-forming organs and certain disorders involving the immune mechanism: Secondary | ICD-10-CM

## 2017-09-21 DIAGNOSIS — R1013 Epigastric pain: Secondary | ICD-10-CM

## 2017-09-21 MED ORDER — LOPERAMIDE HCL 2 MG PO CAPS: 2.0000 mg | ORAL_CAPSULE | Freq: Two times a day (BID) | ORAL | Status: DC

## 2017-09-21 NOTE — Progress Notes (Signed)
Presenting complaint;  Diarrhea.  Database and subjective:  Patient is 77 year old Caucasian female who has diabetic gastroparesis chronic GERD as well as history of colon carcinoma whose last surveillance colonoscopy was in January 2017 who was seen about 2 months ago for diarrhea.  She did not have rectal bleeding fever or chills.  I felt diarrhea was secondary to Metformin.  She was advised to take Imodium.  She followed up with PCP and Metformin dose was halved. However she cannot tell any difference.  She continues to have 5-6 stools per day.  She is taking Imodium on most days which helps some.  On some days she has taken as many as 3 Imodium's.  She has urgency.  She has had multiple accidents.  She has had accidents during the day and night.  She is using diapers.  She denies rectal bleeding.  Her stool is dark because she is on p.o. iron.  She also complains of frequent heartburn intermittent nausea and burning epigastric pain.  She does not have a good appetite however she has not lost any weight.  She is on pain medication for back problems but she does not take it often.  She says she took Z-Pak about 3 months ago and she took another antibiotic 3 weeks ago for sinusitis.  She does not remember the name.  She says she has had diarrhea long before she went on antibiotics. She followed up with Dr. Derrill Kay of [facility] for gastroparesis.  He recommended gastroparesis diet which she is trying to follow.  She has undergone Botox therapy to pyloric channel in the past. She denies any side effects with metoclopramide.  Her daughter Karna Christmas who is accompanying her today has not noted any tremors.  She says she is always rubbing her lips with her tongue because of dryness.   Current Medications: Outpatient Encounter Medications as of 09/21/2017  Medication Sig  . acetaminophen (TYLENOL) 500 MG tablet Take 500 mg by mouth every 6 (six) hours as needed for mild pain.   Marland Kitchen ALPRAZolam (XANAX XR) 1 MG 24 hr tablet  Take 0.5-1 mg by mouth as needed for sleep (Anxiety).   . Calcium Carbonate-Vitamin D (CALCIUM + D PO) Take 1 tablet by mouth daily. TAKING 1,000 CALCIUM AND 2,000 VIT D  . Cyanocobalamin (VITAMIN B 12 PO) Take 2,000 mcg by mouth daily.  Marland Kitchen dexlansoprazole (DEXILANT) 60 MG capsule Take 1 capsule (60 mg total) by mouth daily before breakfast.  . DULoxetine (CYMBALTA) 60 MG capsule Take 60 mg by mouth daily. 2 tablets in the morning  . famotidine (PEPCID) 20 MG tablet Take 2 tablets (40 mg total) by mouth at bedtime.  . ferrous sulfate 325 (65 FE) MG tablet Take 325 mg by mouth at bedtime. Patient states that she does not take every day.   . Furosemide (LASIX PO) Take by mouth daily.  Marland Kitchen loperamide (IMODIUM) 2 MG capsule Take 1 capsule (2 mg total) by mouth every morning.  . metFORMIN (GLUMETZA) 500 MG (MOD) 24 hr tablet Take 1,000 mg by mouth 2 (two) times daily with a meal.   . metoCLOPramide (REGLAN) 10 MG tablet Take 1 tablet (10 mg total) by mouth 3 (three) times daily before meals.  Marland Kitchen MISC NATURAL PRODUCTS ER PO Take 1-3 capsules by mouth 2 (two) times daily. Digestive Blend Softgels  . mometasone (NASONEX) 50 MCG/ACT nasal spray Place 2 sprays into the nose daily as needed (Allergies).   . Multiple Vitamins-Minerals (MULTIVITAMINS THER. W/MINERALS) TABS tablet  Take 1 tablet by mouth daily.  . Nutritional Supplements (ANTI-INFLAMMATORY ENZYME) CAPS Take 10,000 mg by mouth 3 (three) times daily as needed. Patient states that she takes 3 at one time.  Marland Kitchen oxyCODONE-acetaminophen (PERCOCET) 7.5-325 MG tablet Take 1 tablet by mouth every 4 (four) hours as needed for severe pain.  . polyethylene glycol (MIRALAX / GLYCOLAX) packet Take 17 g by mouth daily as needed for mild constipation.   . ranolazine (RANEXA) 1000 MG SR tablet Take 1 tablet (1,000 mg total) by mouth 2 (two) times daily.  . silodosin (RAPAFLO) 8 MG CAPS capsule Take 8 mg by mouth daily.  Marland Kitchen topiramate (TOPAMAX) 100 MG tablet Take 1  tablet (100 mg total) by mouth 2 (two) times daily.  . Travoprost, BAK Free, (TRAVATAN) 0.004 % SOLN ophthalmic solution Place 1 drop into both eyes at bedtime.  . TURMERIC CURCUMIN PO Take by mouth 2 (two) times daily.  . cyclobenzaprine (FLEXERIL) 10 MG tablet Take 10 mg by mouth 3 (three) times daily as needed for muscle spasms.  Marland Kitchen docusate sodium (COLACE) 100 MG capsule Take 1 capsule (100 mg total) by mouth daily as needed for mild constipation. (Patient not taking: Reported on 07/27/2017)   No facility-administered encounter medications on file as of 09/21/2017.      Objective: Blood pressure 118/80, pulse 72, temperature 98.2 F (36.8 C), temperature source Oral, height 5\' 1"  (1.549 m), weight 145 lb 12.8 oz (66.1 kg). Patient is alert and in no acute distress. She has hearing impairment. She does not have abnormal movements to to lips or tongue. Conjunctiva is pink. Sclera is nonicteric Oropharyngeal mucosa is normal. No neck masses or thyromegaly noted. Cardiac exam with regular rhythm normal S1 and S2. No murmur or gallop noted. Lungs are clear to auscultation. Abdomen is symmetrical.  Bowel sounds are normal.  On palpation abdomen is soft with mild midepigastric tenderness.  No organomegaly or masses. No LE edema or clubbing noted.    Assessment:  #1.  Chronic diarrhea.  She has not noted any improvement with Metformin dose reduction.  She is not acutely ill.  She has taken 2 antibiotics in the last 3 months or so.  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  #2.  Epigastric pain.  She has had chronic pain possibly related to her gastroparesis.  Doubt peptic ulcer disease.  #3.  History of colon carcinoma.  Last surveillance exam was in January 2017 with removal of 3 tubular adenomas.  #4.  History of iron deficiency  anemia most likely due to impaired iron absorption secondary to chronic PPI therapy.  #5. Diabetic gastroparesis.  She remains on metoclopramide.  Symptom control suboptimal.   Plan:  Take loperamide OTC 2 mg p.o. twice daily.   GI pathogen panel. CBC and metabolic 7. Further recommendations to follow.

## 2017-09-21 NOTE — Patient Instructions (Signed)
Can take additional or third dose of Imodium if needed. Physician will call with results of blood and stool tests.

## 2017-09-23 LAB — CBC
HCT: 35 % (ref 35.0–45.0)
Hemoglobin: 11.5 g/dL — ABNORMAL LOW (ref 11.7–15.5)
MCH: 31 pg (ref 27.0–33.0)
MCHC: 32.9 g/dL (ref 32.0–36.0)
MCV: 94.3 fL (ref 80.0–100.0)
MPV: 12.2 fL (ref 7.5–12.5)
Platelets: 208 10*3/uL (ref 140–400)
RBC: 3.71 10*6/uL — ABNORMAL LOW (ref 3.80–5.10)
RDW: 12.1 % (ref 11.0–15.0)
WBC: 5.4 10*3/uL (ref 3.8–10.8)

## 2017-09-24 LAB — GASTROINTESTINAL PATHOGEN PANEL PCR
C. difficile Tox A/B, PCR: NOT DETECTED
Campylobacter, PCR: NOT DETECTED
Cryptosporidium, PCR: NOT DETECTED
E coli (ETEC) LT/ST PCR: NOT DETECTED
E coli (STEC) stx1/stx2, PCR: NOT DETECTED
E coli 0157, PCR: NOT DETECTED
Giardia lamblia, PCR: NOT DETECTED
Norovirus, PCR: NOT DETECTED
Rotavirus A, PCR: NOT DETECTED
Salmonella, PCR: NOT DETECTED
Shigella, PCR: NOT DETECTED

## 2017-09-28 ENCOUNTER — Telehealth (INDEPENDENT_AMBULATORY_CARE_PROVIDER_SITE_OTHER): Payer: Self-pay | Admitting: Internal Medicine

## 2017-09-28 NOTE — Telephone Encounter (Signed)
Talked with Ms. [name] and Dr. Vickey Sages office. She agrees with stopping metformin for the time being. Patient informed. She will do fasting blood glucose and has an appointment Dr. Artis Flock office on 09/30/2017. She is advised to call our office towards the end of next week regarding diarrhea. Unless diarrhea resolves off metformin we will proceed with diagnostic colonoscopy.

## 2017-10-27 ENCOUNTER — Ambulatory Visit (INDEPENDENT_AMBULATORY_CARE_PROVIDER_SITE_OTHER): Payer: Medicare Other | Admitting: Internal Medicine

## 2017-11-10 ENCOUNTER — Encounter: Payer: Self-pay | Admitting: Cardiovascular Disease

## 2017-11-10 ENCOUNTER — Ambulatory Visit (INDEPENDENT_AMBULATORY_CARE_PROVIDER_SITE_OTHER): Payer: Medicare Other | Admitting: Cardiovascular Disease

## 2017-11-10 VITALS — BP 148/74 | HR 64 | Ht 59.0 in | Wt 149.0 lb

## 2017-11-10 DIAGNOSIS — R001 Bradycardia, unspecified: Secondary | ICD-10-CM | POA: Diagnosis not present

## 2017-11-10 DIAGNOSIS — I10 Essential (primary) hypertension: Secondary | ICD-10-CM

## 2017-11-10 DIAGNOSIS — I2510 Atherosclerotic heart disease of native coronary artery without angina pectoris: Secondary | ICD-10-CM

## 2017-11-10 DIAGNOSIS — R079 Chest pain, unspecified: Secondary | ICD-10-CM | POA: Diagnosis not present

## 2017-11-10 DIAGNOSIS — N644 Mastodynia: Secondary | ICD-10-CM

## 2017-11-10 MED ORDER — RANOLAZINE ER 1000 MG PO TB12: 1000.0000 mg | ORAL_TABLET | Freq: Two times a day (BID) | ORAL | 3 refills | Status: DC

## 2017-11-10 NOTE — Addendum Note (Signed)
Addended by: Barbarann Ehlers A on: 11/10/2017 11:37 AM   Modules accepted: Orders

## 2017-11-10 NOTE — Patient Instructions (Addendum)
Your physician wants you to follow-up in:  1 year with Dr.Koneswaran You will receive a reminder letter in the mail two months in advance. If you don't receive a letter, please call our office to schedule the follow-up appointment.    Your physician recommends that you continue on your current medications as directed. Please refer to the Current Medication list given to you today.    If you need a refill on your cardiac medications before your next appointment, please call your pharmacy.      No lab work or tests ordered today.      Thank you for choosing [facility] !        

## 2017-11-10 NOTE — Progress Notes (Signed)
SUBJECTIVE: The patient is here to followup for nonobstructive coronary artery disease, chest pain syndrome and a history of asymptomatic sinus bradycardia. Coronary angiography 04/19/13 showed 30% mid vessel and 40% distal LAD stenosis, 30-40% distal OM1 lesion, 20% proximal, 30% at the crux, and 40-50% distal RCA stenosis.  She also has severe GERD.  She sees both Dr. Laural Golden and Dr. Derrill Kay at [facility] for GI issues.  ECG performed today which I personally reviewed demonstrates sinus bradycardia with old inferior infarct pattern and diffuse nonspecific T wave abnormalities, 51 bpm.  She has been having episodic retrosternal chest pains which she describes as a "squeezing sensation "with sudden release.  She feels it is similar to her GERD and esophageal pain.  Symptoms typically occur at rest.  She also describes some left breast tenderness.  She is due to have a mammogram.  I reviewed her last mammogram dated 10/24/2016 which appear to be normal.    Review of Systems: As per "subjective", otherwise negative.  Allergies  Allergen Reactions  . Botox [Onabotulinumtoxina]     Patient states that it became loose in her stomach , and it caused multiple problems.  . Morphine Anaphylaxis and Other (See Comments)    "it will kill me" "made me stop breathing"  . Aspirin Other (See Comments)    Gi bleed   . Cefuroxime Axetil Nausea And Vomiting  . Codeine Nausea And Vomiting  . Diltiazem Nausea And Vomiting  . Dronabinol Nausea And Vomiting and Palpitations    Patient also states that she had palpatations  . Shellfish Allergy Other (See Comments)    Blood sugar drops  . Ondansetron Hives  . Penicillins Rash    Current Outpatient Medications  Medication Sig Dispense Refill  . acetaminophen (TYLENOL) 500 MG tablet Take 500 mg by mouth every 6 (six) hours as needed for mild pain.     Marland Kitchen ALPRAZolam (XANAX XR) 1 MG 24 hr tablet Take 0.5-1 mg by mouth as needed for sleep (Anxiety).       . Calcium Carbonate-Vitamin D (CALCIUM + D PO) Take 1 tablet by mouth daily. TAKING 1,000 CALCIUM AND 2,000 VIT D    . Cyanocobalamin (VITAMIN B 12 PO) Take 2,000 mcg by mouth daily.    . cyclobenzaprine (FLEXERIL) 10 MG tablet Take 10 mg by mouth 3 (three) times daily as needed for muscle spasms.    Marland Kitchen dexlansoprazole (DEXILANT) 60 MG capsule Take 1 capsule (60 mg total) by mouth daily before breakfast. 90 capsule 3  . docusate sodium (COLACE) 100 MG capsule Take 1 capsule (100 mg total) by mouth daily as needed for mild constipation. 1 capsule   . DULoxetine (CYMBALTA) 60 MG capsule Take 60 mg by mouth daily. 2 tablets in the morning    . famotidine (PEPCID) 20 MG tablet Take 2 tablets (40 mg total) by mouth at bedtime. 180 tablet 3  . ferrous sulfate 325 (65 FE) MG tablet Take 325 mg by mouth at bedtime. Patient states that she does not take every day.     . Furosemide (LASIX PO) Take by mouth daily.    Marland Kitchen loperamide (IMODIUM) 2 MG capsule Take 1 capsule (2 mg total) by mouth 2 (two) times daily. 30 capsule   . metFORMIN (GLUMETZA) 500 MG (MOD) 24 hr tablet Take 500 mg by mouth 2 (two) times daily with a meal.     . metoCLOPramide (REGLAN) 10 MG tablet Take 1 tablet (10 mg total) by mouth  3 (three) times daily before meals. 270 tablet 1  . MISC NATURAL PRODUCTS ER PO Take 1-3 capsules by mouth 2 (two) times daily. Digestive Blend Softgels    . mometasone (NASONEX) 50 MCG/ACT nasal spray Place 2 sprays into the nose daily as needed (Allergies).     . Multiple Vitamins-Minerals (MULTIVITAMINS THER. W/MINERALS) TABS tablet Take 1 tablet by mouth daily.    . Nutritional Supplements (ANTI-INFLAMMATORY ENZYME) CAPS Take 10,000 mg by mouth 3 (three) times daily as needed. Patient states that she takes 3 at one time.    Marland Kitchen oxyCODONE-acetaminophen (PERCOCET) 7.5-325 MG tablet Take 1 tablet by mouth every 4 (four) hours as needed for severe pain.    . polyethylene glycol (MIRALAX / GLYCOLAX) packet Take 17  g by mouth daily as needed for mild constipation.     . ranolazine (RANEXA) 1000 MG SR tablet Take 1 tablet (1,000 mg total) by mouth 2 (two) times daily. 180 tablet 3  . silodosin (RAPAFLO) 8 MG CAPS capsule Take 8 mg by mouth daily.    Marland Kitchen topiramate (TOPAMAX) 100 MG tablet Take 1 tablet (100 mg total) by mouth 2 (two) times daily. 60 tablet 5  . Travoprost, BAK Free, (TRAVATAN) 0.004 % SOLN ophthalmic solution Place 1 drop into both eyes at bedtime.    . TURMERIC CURCUMIN PO Take by mouth 2 (two) times daily.     No current facility-administered medications for this visit.     Past Medical History:  Diagnosis Date  . Anemia   . Anxiety   . Arthritis    "all over" (04/19/2013)  . Bleeding stomach ulcer 1980's  . Bloating   . Chronic back pain   . Chronic headaches   . Chronic heartburn   . Chronic neck pain   . Chronic pain   . Colon cancer ([place])   . DDD (degenerative disc disease), lumbar   . Diabetic peripheral neuropathy ([place])    "in my feet" (04/19/2013)  . Dizziness   . Dysphagia   . Family history of anesthesia complication    "daughter has PONV too" (04/19/2013)  . Fibromyalgia   . Gastroesophageal reflux   . Gastroparesis   . Glaucoma, bilateral   . Hiatal hernia    "had it before; had OR; got it again" (04/19/2013)  . History of blood transfusion 1980's   "w/bleeding stomach ulcer" (04/19/2013)  . Hypertension    "for diabetes; don't have high blood pressure" (04/19/2013)  . Migraine    "take RX for it qd" (04/19/2013)  . Nausea   . Pneumonia 04/2012  . PONV (postoperative nausea and vomiting)   . Renal insufficiency   . TIA (transient ischemic attack)    "3-4 before starting RX; none since" (04/19/2013)  . Type II diabetes mellitus ([place])   . Walking pneumonia ~ 1966    Past Surgical History:  Procedure Laterality Date  . ABDOMINAL ADHESION SURGERY  ~ 2012   "repaired wrap where they did hiatal hernia OR too" 911/04/2013)  . ANTERIOR CERVICAL  DISCECTOMY  ~ 2009   "only cleaned out arthritis and spurs" (04/19/2013)  . CATARACT EXTRACTION W/ INTRAOCULAR LENS IMPLANT Left 04/13/2013  . CHOLECYSTECTOMY  1980's  . COLON SURGERY    . COLONOSCOPY  10/11/2010  . COLONOSCOPY  11/23/2009  . COLONOSCOPY  07/28/2008   W/SNARE  . COLONOSCOPY  06/28/07  . COLONOSCOPY  05/10/07   W/POLYP  . COLONOSCOPY  12/28/00  . COLONOSCOPY N/A 06/29/2015   Procedure: COLONOSCOPY;  Surgeon: Rogene Houston, MD;  Location: [facility];  Service: Endoscopy;  Laterality: N/A;  135  . COLONOSCOPY WITH ESOPHAGOGASTRODUODENOSCOPY (EGD) N/A 05/13/2013   Procedure: COLONOSCOPY WITH ESOPHAGOGASTRODUODENOSCOPY (EGD);  Surgeon: Rogene Houston, MD;  Location: [facility];  Service: Endoscopy;  Laterality: N/A;  855  . HEMICOLECTOMY  2010   ZIE[facility]ER  . HIATAL HERNIA REPAIR  1970's  . LEFT HEART CATHETERIZATION WITH CORONARY ANGIOGRAM N/A 04/19/2013   Procedure: LEFT HEART CATHETERIZATION WITH CORONARY ANGIOGRAM;  Surgeon: Peter M Martinique, MD;  Location: [facility] CATH LAB;  Service: Cardiovascular;  Laterality: N/A;  . [place]   "had a broken neck" (04/19/2013)  . TONSILLECTOMY  ~ 1953  . UPPER GASTROINTESTINAL ENDOSCOPY  10/11/2010   EGD ED  . UPPER GASTROINTESTINAL ENDOSCOPY  11/23/2009  . UPPER GASTROINTESTINAL ENDOSCOPY  05/10/07   EGD ED  . UPPER GASTROINTESTINAL ENDOSCOPY  08/11/01   EGD ED  . VAGINAL HYSTERECTOMY      Social History   Socioeconomic History  . Marital status: Widowed    Spouse name: Not on file  . Number of children: Not on file  . Years of education: Not on file  . Highest education level: Not on file  Occupational History    Employer: RETIRED  Social Needs  . Financial resource strain: Not on file  . Food insecurity:    Worry: Not on file    Inability: Not on file  . Transportation needs:    Medical: Not on file    Non-medical: Not on file  Tobacco Use  . Smoking status: Former Smoker     Packs/day: 1.50    Years: 15.00    Pack years: 22.50    Types: Cigarettes    Last attempt to quit: 03/17/1991    Years since quitting: 26.6  . Smokeless tobacco: Never Used  . Tobacco comment: Patient states that it has ben greater than 20 years since she quit smoking  Substance and Sexual Activity  . Alcohol use: No    Alcohol/week: 0.0 oz  . Drug use: No  . Sexual activity: Never  Lifestyle  . Physical activity:    Days per week: Not on file    Minutes per session: Not on file  . Stress: Not on file  Relationships  . Social connections:    Talks on phone: Not on file    Gets together: Not on file    Attends religious service: Not on file    Active member of club or organization: Not on file    Attends meetings of clubs or organizations: Not on file    Relationship status: Not on file  . Intimate partner violence:    Fear of current or ex partner: Not on file    Emotionally abused: Not on file    Physically abused: Not on file    Forced sexual activity: Not on file  Other Topics Concern  . Not on file  Social History Narrative   Patient lives at home alone.    Patient is retired.    Patient is widowed.    Patient has 4 children.   Patient has a 11th grade education.            Vitals:   11/10/17 1055  BP: (!) 148/74  Pulse: 64  SpO2: 98%  Weight: 149 lb (67.6 kg)  Height: 4\' 11"  (1.499 m)    Wt Readings from Last 3 Encounters:  11/10/17 149  lb (67.6 kg)  09/21/17 145 lb 12.8 oz (66.1 kg)  07/27/17 145 lb 9.6 oz (66 kg)     PHYSICAL EXAM General: NAD HEENT: Normal. Neck: No JVD, no thyromegaly. Lungs: Clear to auscultation bilaterally with normal respiratory effort. CV: Bradycardic, regular rhythm, normal S1/S2, no S3/S4, no murmur.  Positive left breast tenderness of upper inner quadrant.  No pretibial or periankle edema.  No carotid bruit.   Abdomen: Soft, nontender, no distention.  Neurologic: Alert and oriented.  Psych: Normal affect. Skin:  Normal. Musculoskeletal: No gross deformities.    ECG: Most recent ECG reviewed.   Labs: Lab Results  Component Value Date/Time   K 2.9 (L) 07/04/2016 04:24 AM   BUN 8 07/04/2016 04:24 AM   CREATININE 0.60 07/04/2016 04:24 AM   CREATININE 0.91 12/25/2015 10:04 AM   ALT 13 (L) 07/04/2016 03:35 AM   TSH 1.31 12/25/2015 10:04 AM   HGB 11.5 (L) 09/23/2017 09:26 AM     Lipids: No results found for: LDLCALC, LDLDIRECT, CHOL, TRIG, HDL     ASSESSMENT AND PLAN: 1. Chest pain syndrome: Symptomatically stable.  Current symptoms appear to be typical of esophageal spasm pain.  Has nonobstructive coronary artery disease and severe GERD. Continue Ranexa 1000 mg twice daily.  2. Bradycardia: She is asymptomatic. Heart rate is 64 bpm today when checked by nurse and 51 bpm by ECG. Prior Holter monitoring showed normal sinus rhythm with very few PACs and sinus bradycardia with an average heart rate of 50 beats per minute. There were no long pauses detected. Given her known h/o severe gastroparesis, she may very well have an autonomic neuropathy.   3. CAD: Nonobstructive coronary artery disease. Continue Ranexa.   4. Hypertension: Blood pressure is mildly elevated.  It was elevated at her last visit as well.  It was normal at her visit with GI on 09/21/2017.  This will need further monitoring to determine if medication titration is indicated.  5.  Left breast tenderness: Normal mammogram in May 2018.  Due to have another mammogram this year.     Disposition: Follow up 1 year   Kate Sable, M.D., F.A.C.C.

## 2017-11-17 ENCOUNTER — Ambulatory Visit (INDEPENDENT_AMBULATORY_CARE_PROVIDER_SITE_OTHER): Payer: Medicare Other | Admitting: Internal Medicine

## 2018-01-31 IMAGING — MG 2D DIGITAL SCREENING BILATERAL MAMMOGRAM WITH CAD AND ADJUNCT TO
8 series · 9 of 24 positions shown · non-contrast
Comparison: Previous exam(s).

CLINICAL DATA: Screening.

EXAM:
2D DIGITAL SCREENING BILATERAL MAMMOGRAM WITH CAD AND ADJUNCT TOMO

[L MLO]
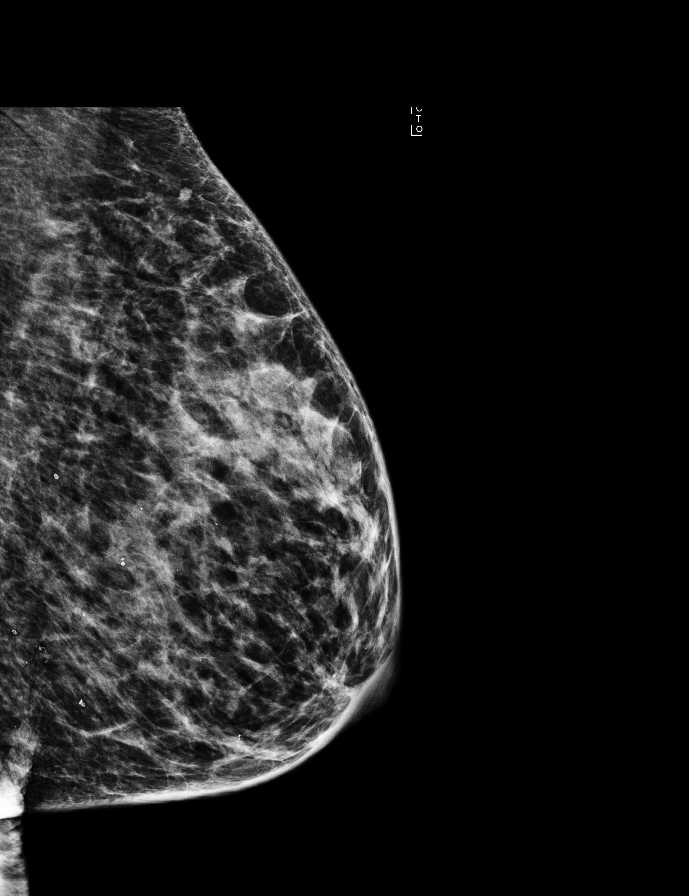

[L CC]
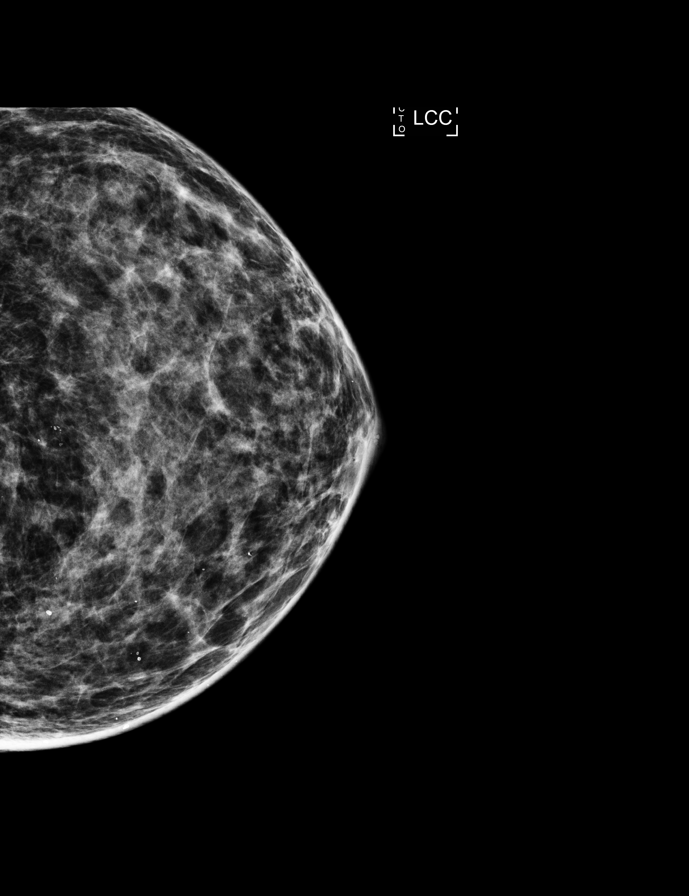

[R MLO]
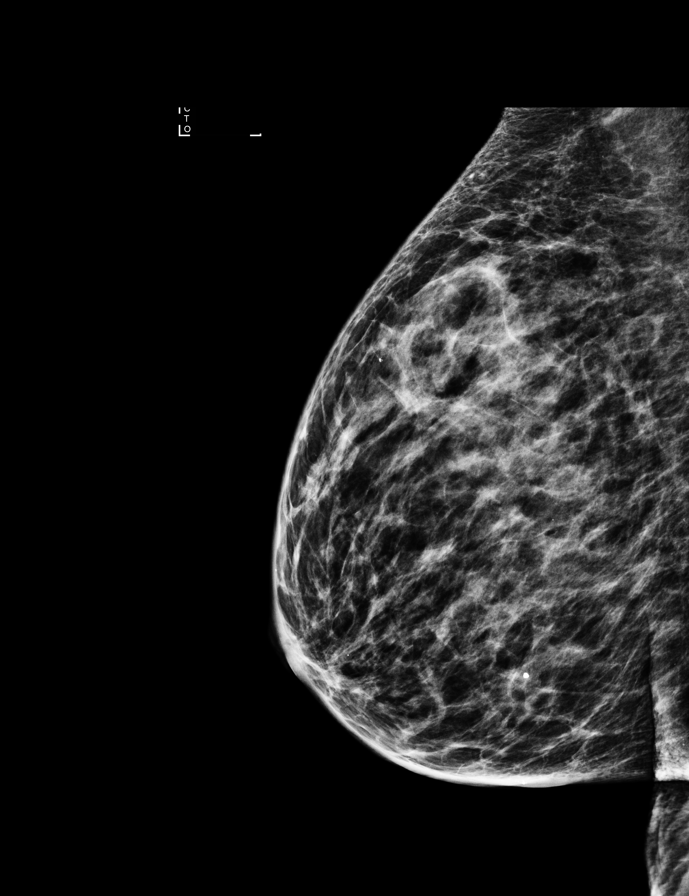

[R CC]
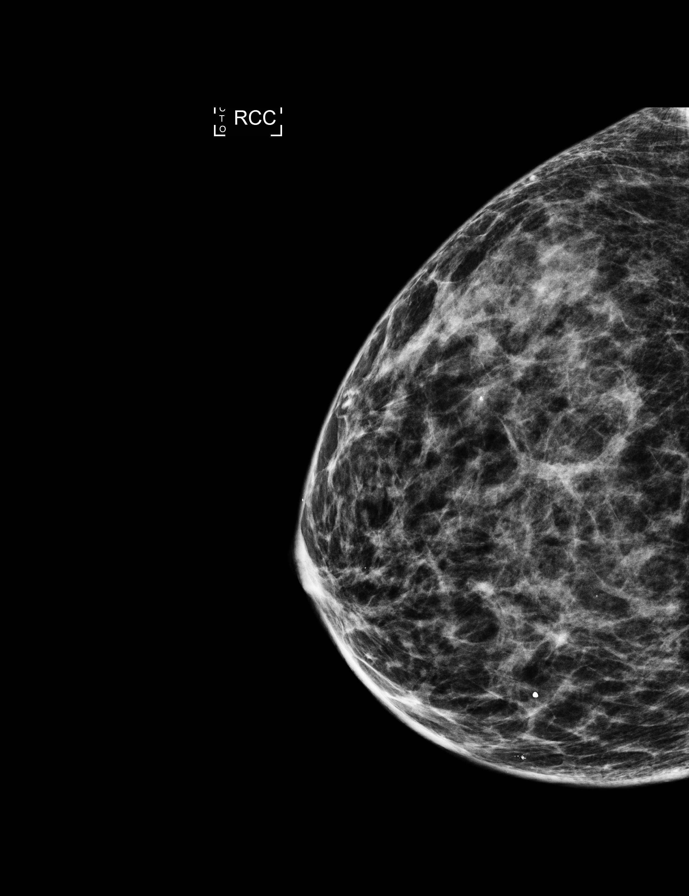

[L MLO tomo · 2 of 42 frames shown]
[frame 14/42]
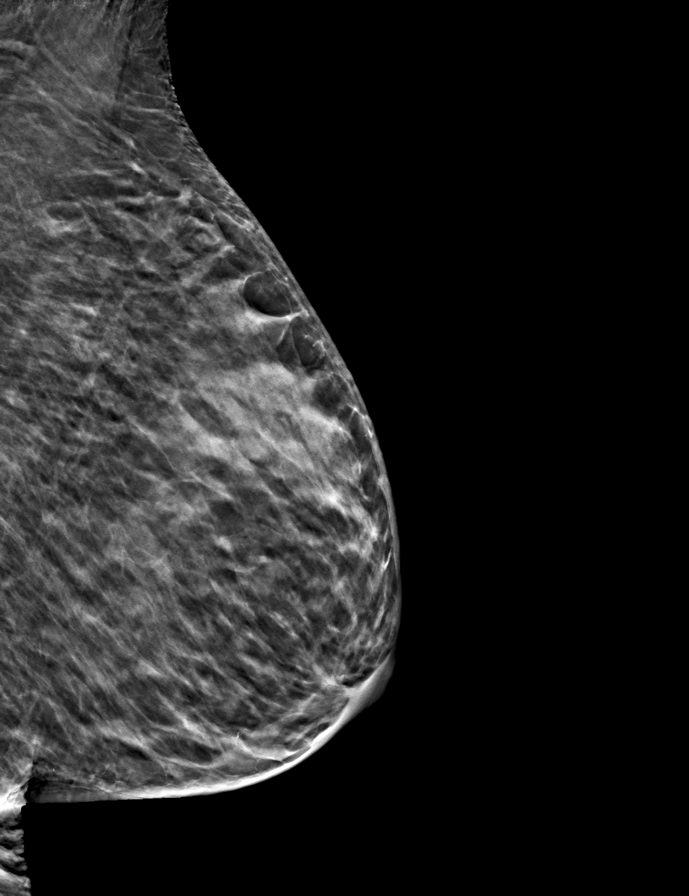
[frame 21/42]
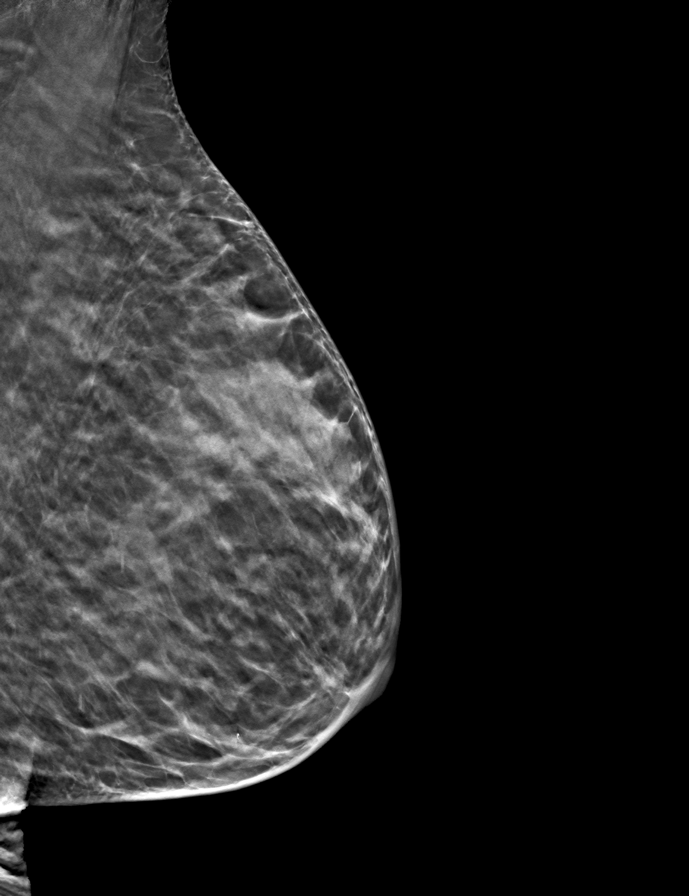

[R MLO tomo · tomo slice 21/41.0]
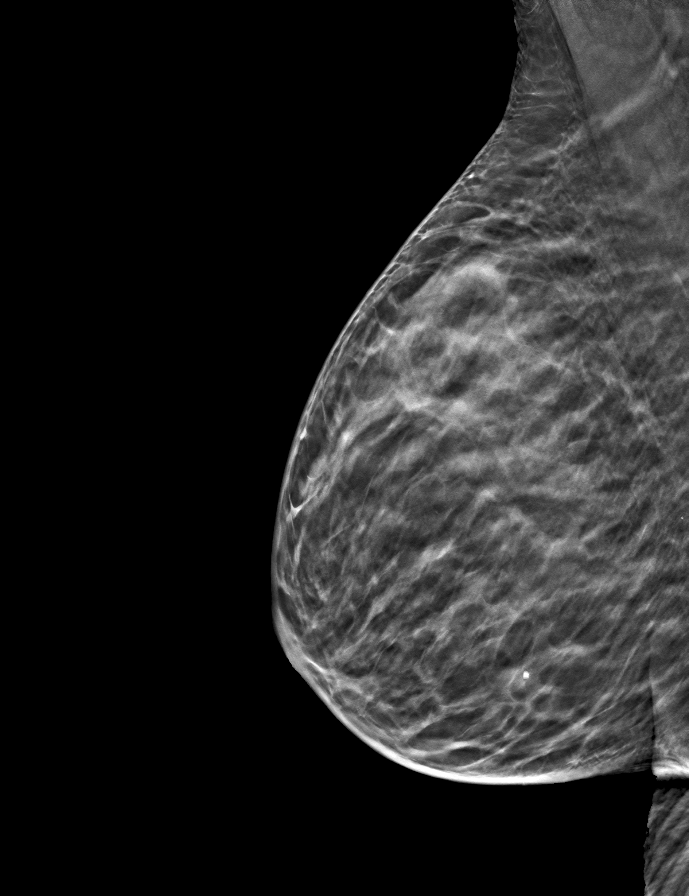

[L CC tomo · tomo slice 21/40.0]
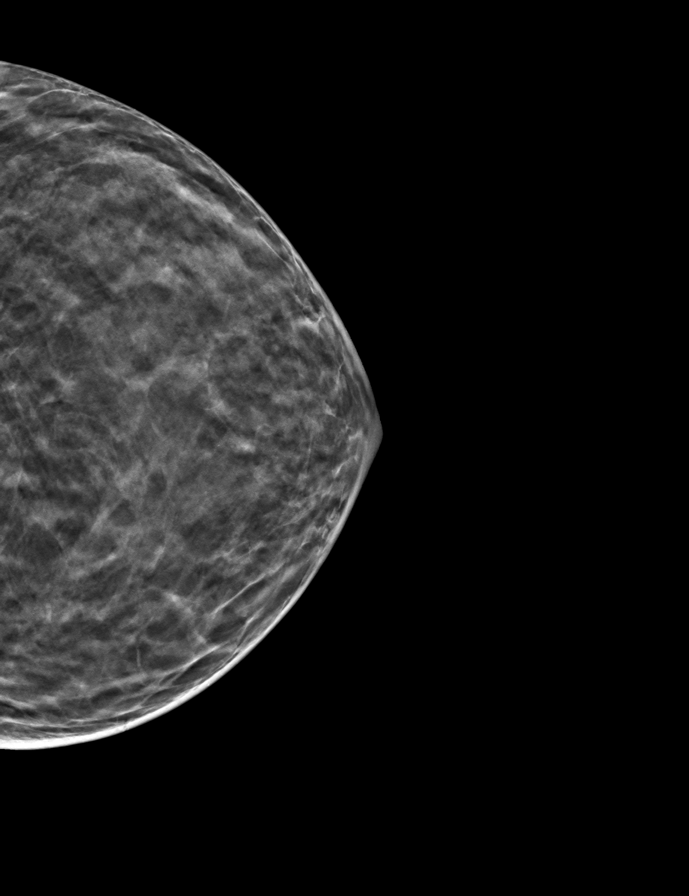

[R CC tomo · tomo slice 21/40.0]
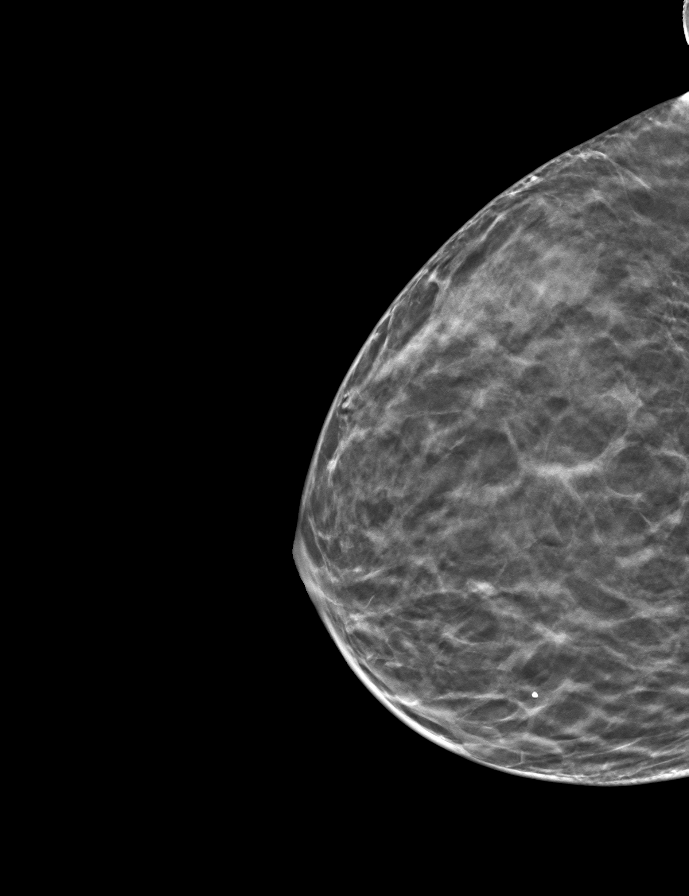

[9 of 24 positions shown; findings below may reference images not displayed]

ACR Breast Density Category c: The breast tissue is heterogeneously
dense, which may obscure small masses.
FINDINGS: There are no findings suspicious for malignancy. Images were
processed with CAD.
IMPRESSION: No mammographic evidence of malignancy. A result letter of this
screening mammogram will be mailed directly to the patient.

RECOMMENDATION:
Screening mammogram in one year. (Code:[id number])

BI-RADS CATEGORY  1: Negative.

## 2018-02-03 ENCOUNTER — Encounter: Payer: Self-pay | Admitting: Internal Medicine

## 2018-02-09 ENCOUNTER — Other Ambulatory Visit (HOSPITAL_COMMUNITY): Payer: Self-pay | Admitting: Family Medicine

## 2018-02-09 DIAGNOSIS — I2510 Atherosclerotic heart disease of native coronary artery without angina pectoris: Secondary | ICD-10-CM

## 2018-02-11 ENCOUNTER — Encounter: Payer: Self-pay | Admitting: Internal Medicine

## 2018-02-15 ENCOUNTER — Other Ambulatory Visit (HOSPITAL_COMMUNITY): Payer: Self-pay | Admitting: Nurse Practitioner

## 2018-02-15 DIAGNOSIS — K3184 Gastroparesis: Secondary | ICD-10-CM

## 2018-02-15 DIAGNOSIS — R7989 Other specified abnormal findings of blood chemistry: Secondary | ICD-10-CM

## 2018-02-15 DIAGNOSIS — Z8639 Personal history of other endocrine, nutritional and metabolic disease: Secondary | ICD-10-CM

## 2018-02-15 DIAGNOSIS — N289 Disorder of kidney and ureter, unspecified: Secondary | ICD-10-CM

## 2018-02-15 DIAGNOSIS — R799 Abnormal finding of blood chemistry, unspecified: Secondary | ICD-10-CM

## 2018-02-15 DIAGNOSIS — N23 Unspecified renal colic: Secondary | ICD-10-CM

## 2018-02-15 DIAGNOSIS — E1143 Type 2 diabetes mellitus with diabetic autonomic (poly)neuropathy: Secondary | ICD-10-CM

## 2018-02-22 ENCOUNTER — Ambulatory Visit (HOSPITAL_COMMUNITY)
Admission: RE | Admit: 2018-02-22 | Discharge: 2018-02-22 | Disposition: A | Discharge status: Home / Self Care | Payer: Medicare Other | Source: Ambulatory Visit | Attending: Family Medicine | Admitting: Family Medicine

## 2018-02-22 DIAGNOSIS — I2510 Atherosclerotic heart disease of native coronary artery without angina pectoris: Secondary | ICD-10-CM | POA: Insufficient documentation

## 2018-02-22 NOTE — Progress Notes (Signed)
*  PRELIMINARY RESULTS* Echocardiogram 2D Echocardiogram has been performed.  [name] 02/22/2018, 3:45 PM

## 2018-03-09 ENCOUNTER — Ambulatory Visit (INDEPENDENT_AMBULATORY_CARE_PROVIDER_SITE_OTHER): Payer: Medicare Other | Admitting: Internal Medicine

## 2018-03-11 ENCOUNTER — Encounter: Payer: Self-pay | Admitting: Internal Medicine

## 2018-03-15 ENCOUNTER — Encounter (INDEPENDENT_AMBULATORY_CARE_PROVIDER_SITE_OTHER): Payer: Self-pay | Admitting: Internal Medicine

## 2018-03-15 ENCOUNTER — Other Ambulatory Visit (INDEPENDENT_AMBULATORY_CARE_PROVIDER_SITE_OTHER): Payer: Self-pay | Admitting: Internal Medicine

## 2018-03-15 ENCOUNTER — Ambulatory Visit (INDEPENDENT_AMBULATORY_CARE_PROVIDER_SITE_OTHER): Payer: Medicare Other | Admitting: Internal Medicine

## 2018-03-15 VITALS — BP 124/70 | HR 60 | Temp 98.2°F | Resp 18 | Ht 59.0 in | Wt 146.5 lb

## 2018-03-15 DIAGNOSIS — K219 Gastro-esophageal reflux disease without esophagitis: Secondary | ICD-10-CM

## 2018-03-15 DIAGNOSIS — Z862 Personal history of diseases of the blood and blood-forming organs and certain disorders involving the immune mechanism: Secondary | ICD-10-CM

## 2018-03-15 DIAGNOSIS — K3184 Gastroparesis: Secondary | ICD-10-CM

## 2018-03-15 DIAGNOSIS — R197 Diarrhea, unspecified: Secondary | ICD-10-CM

## 2018-03-15 DIAGNOSIS — I2510 Atherosclerotic heart disease of native coronary artery without angina pectoris: Secondary | ICD-10-CM

## 2018-03-15 DIAGNOSIS — R1310 Dysphagia, unspecified: Secondary | ICD-10-CM

## 2018-03-15 DIAGNOSIS — R1319 Other dysphagia: Secondary | ICD-10-CM

## 2018-03-15 DIAGNOSIS — E1143 Type 2 diabetes mellitus with diabetic autonomic (poly)neuropathy: Secondary | ICD-10-CM

## 2018-03-15 NOTE — Progress Notes (Signed)
Presenting complaint;  Swallowing difficulty and persistent heartburn.  Database and subjective: Patient is 77 year old Caucasian female with multiple GI problems including chronic GERD status post antireflux surgery, achalasia as well as diabetic gastroparesis iron deficiency anemia and history of colon carcinoma who is here for scheduled visit.  Her last visit was about 6 months ago for diarrhea and work-up was negative and she responded to cholestyramine.  Prior to that she used to have constipation.  Patient states she is doing well with cholestyramine.  Her bowels move daily.  She generally passes soft to formed stool.  She has not taken MiraLAX in the past few months.  She says her stools remain black because she is on iron.  However she has not experienced any rectal bleeding.  She continues to complain of daily heartburn which is gotten worse lately.  She remains on dexlansoprazole.  She also complains of dysphagia to solids.  She says this symptom has gotten worse over the last 2 to 3 months.  She has most difficulty with bread and meat.  She denies weight loss.  She says she had blood work by Dr. Karie Kirks recently.  She has not been to see Dr. Derrill Kay lately.  He decided not to pursue with Botox injection to her pylorus anymore. Patient denies tremors or any other side effects with metoclopramide.  Current Medications: Outpatient Encounter Medications as of 03/15/2018  Medication Sig  . ALPRAZolam (XANAX XR) 1 MG 24 hr tablet Take 0.5-1 mg by mouth as needed for sleep (Anxiety).   . Calcium Carbonate-Vitamin D (CALCIUM + D PO) Take 1 tablet by mouth daily. TAKING 1,000 CALCIUM AND 2,000 VIT D  . cholestyramine (QUESTRAN) 4 g packet Take 4 g by mouth 2 (two) times daily.  . Cyanocobalamin (VITAMIN B 12 PO) Take 2,000 mcg by mouth daily.  . cyclobenzaprine (FLEXERIL) 10 MG tablet Take 10 mg by mouth 3 (three) times daily as needed for muscle spasms.  Marland Kitchen dexlansoprazole (DEXILANT) 60 MG capsule  Take 1 capsule (60 mg total) by mouth daily before breakfast.  . docusate sodium (COLACE) 100 MG capsule Take 1 capsule (100 mg total) by mouth daily as needed for mild constipation.  . DULoxetine (CYMBALTA) 60 MG capsule Take 60 mg by mouth daily. 2 tablets in the morning  . fenofibrate micronized (LOFIBRA) 134 MG capsule Take 134 mg by mouth daily before breakfast.  . metFORMIN (GLUMETZA) 500 MG (MOD) 24 hr tablet Take 500 mg by mouth 2 (two) times daily with a meal.   . metoCLOPramide (REGLAN) 10 MG tablet Take 1 tablet (10 mg total) by mouth 3 (three) times daily before meals.  . Multiple Vitamins-Minerals (MULTIVITAMINS THER. W/MINERALS) TABS tablet Take 1 tablet by mouth daily.  Marland Kitchen oxyCODONE-acetaminophen (PERCOCET) 7.5-325 MG tablet Take 1 tablet by mouth every 4 (four) hours as needed for severe pain.  . pantoprazole (PROTONIX) 40 MG tablet Take 40 mg by mouth daily.  . polyethylene glycol (MIRALAX / GLYCOLAX) packet Take 17 g by mouth daily as needed for mild constipation.   . ranolazine (RANEXA) 1000 MG SR tablet Take 1 tablet (1,000 mg total) by mouth 2 (two) times daily.  . silodosin (RAPAFLO) 8 MG CAPS capsule Take 8 mg by mouth daily.  Marland Kitchen topiramate (TOPAMAX) 100 MG tablet Take 1 tablet (100 mg total) by mouth 2 (two) times daily.  Marland Kitchen acetaminophen (TYLENOL) 500 MG tablet Take 500 mg by mouth every 6 (six) hours as needed for mild pain.   Marland Kitchen  mometasone (NASONEX) 50 MCG/ACT nasal spray Place 2 sprays into the nose daily as needed (Allergies).   . [DISCONTINUED] famotidine (PEPCID) 20 MG tablet Take 2 tablets (40 mg total) by mouth at bedtime. (Patient not taking: Reported on 03/15/2018)  . [DISCONTINUED] ferrous sulfate 325 (65 FE) MG tablet Take 325 mg by mouth at bedtime. Patient states that she does not take every day.   . [DISCONTINUED] Furosemide (LASIX PO) Take by mouth daily.  . [DISCONTINUED] loperamide (IMODIUM) 2 MG capsule Take 1 capsule (2 mg total) by mouth 2 (two) times daily.  (Patient not taking: Reported on 03/15/2018)  . [DISCONTINUED] MISC NATURAL PRODUCTS ER PO Take 1-3 capsules by mouth 2 (two) times daily. Digestive Blend Softgels  . [DISCONTINUED] Nutritional Supplements (ANTI-INFLAMMATORY ENZYME) CAPS Take 10,000 mg by mouth 3 (three) times daily as needed. Patient states that she takes 3 at one time.  . [DISCONTINUED] Travoprost, BAK Free, (TRAVATAN) 0.004 % SOLN ophthalmic solution Place 1 drop into both eyes at bedtime.  . [DISCONTINUED] TURMERIC CURCUMIN PO Take by mouth 2 (two) times daily.   No facility-administered encounter medications on file as of 03/15/2018.      Objective: Blood pressure 124/70, pulse 60, temperature 98.2 F (36.8 C), temperature source Oral, resp. rate 18, height 4\' 11"  (1.499 m), weight 146 lb 8 oz (66.5 kg). Patient is alert and in no acute distress. Conjunctiva is pink. Sclera is nonicteric Oropharyngeal mucosa is normal. She has upper and lower dentures in place. No neck masses or thyromegaly noted. Cardiac exam with regular rhythm normal S1 and S2. No murmur or gallop noted. Lungs are clear to auscultation. Abdomen is symmetrical.  Lower midline scar noted.  On palpation abdomen is soft.  There is mild tenderness in mid epigastrium and right lower quadrant.  No organomegaly or masses.  No succussion splash noted epigastric region. No LE edema or clubbing noted. Patient does not have facial lingual or extremity tremors.  Labs/studies Results: Lab data from 02/03/2018 WBC 6.0, H&H 11.4 and 34.2.  MCV 92.7 and platelet count 239K.  H&H was 11.5 and 35.0 on 09/23/2017  Assessment:  #1.  Esophageal dysphagia.  She has history of achalasia and prior to that she has undergone antireflux surgery for GERD.  It remains to be seen if dysphagia is due to one or the other.  #2.  Chronic GERD.  Symptoms are poorly controlled because of underlying esophageal motility disorder and diabetic gastroparesis.  #3.  History of iron  deficiency anemia.  She remains on p.o. iron and her H&H has just below normal.  She may have an element of anemia of chronic disease.  #4.  History of colon carcinoma.  She underwent right hemicolectomy in March 2010.  She is up-to-date on on surveillance.  Last colonoscopy was in January 2017.  #5. 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  She is doing well with cholestyramine.  #6.  Diabetic gastroparesis.  She has undergone Botox therapy to pyloric channel which has provided temporary relief.  She remains on metoclopramide and likely not experiencing any side effects.   Plan:  Barium pill esophagogram. She will continue dexlansoprazole, metoclopramide and cholestyramine as before. Office visit in 6 months.

## 2018-03-15 NOTE — Patient Instructions (Signed)
Physician will call with results of barium study and further recommendations.

## 2018-03-19 ENCOUNTER — Ambulatory Visit (HOSPITAL_COMMUNITY)
Admission: RE | Admit: 2018-03-19 | Discharge: 2018-03-19 | Disposition: A | Discharge status: Home / Self Care | Payer: Medicare Other | Source: Ambulatory Visit | Attending: Internal Medicine | Admitting: Internal Medicine

## 2018-03-19 DIAGNOSIS — R1310 Dysphagia, unspecified: Secondary | ICD-10-CM | POA: Insufficient documentation

## 2018-03-19 DIAGNOSIS — K449 Diaphragmatic hernia without obstruction or gangrene: Secondary | ICD-10-CM | POA: Insufficient documentation

## 2018-03-22 ENCOUNTER — Ambulatory Visit (INDEPENDENT_AMBULATORY_CARE_PROVIDER_SITE_OTHER): Payer: Medicare Other | Admitting: Otolaryngology

## 2018-03-22 DIAGNOSIS — H6121 Impacted cerumen, right ear: Secondary | ICD-10-CM

## 2018-03-22 DIAGNOSIS — H903 Sensorineural hearing loss, bilateral: Secondary | ICD-10-CM

## 2018-03-22 DIAGNOSIS — H838X3 Other specified diseases of inner ear, bilateral: Secondary | ICD-10-CM | POA: Diagnosis not present

## 2018-03-22 DIAGNOSIS — H9209 Otalgia, unspecified ear: Secondary | ICD-10-CM

## 2018-04-19 ENCOUNTER — Other Ambulatory Visit (HOSPITAL_COMMUNITY): Payer: Self-pay | Admitting: Plastic Surgery

## 2018-04-19 ENCOUNTER — Other Ambulatory Visit (HOSPITAL_COMMUNITY): Payer: Self-pay | Admitting: Nephrology

## 2018-04-19 DIAGNOSIS — N183 Chronic kidney disease, stage 3 unspecified: Secondary | ICD-10-CM

## 2018-04-30 ENCOUNTER — Ambulatory Visit (HOSPITAL_COMMUNITY)
Admission: RE | Admit: 2018-04-30 | Discharge: 2018-04-30 | Disposition: A | Discharge status: Home / Self Care | Payer: Medicare Other | Source: Ambulatory Visit | Attending: Plastic Surgery | Admitting: Plastic Surgery

## 2018-04-30 DIAGNOSIS — N183 Chronic kidney disease, stage 3 unspecified: Secondary | ICD-10-CM

## 2018-07-02 ENCOUNTER — Telehealth (INDEPENDENT_AMBULATORY_CARE_PROVIDER_SITE_OTHER): Payer: Self-pay | Admitting: *Deleted

## 2018-07-02 NOTE — Telephone Encounter (Signed)
Patient is on recall for TCS, she just had one on 2017 -- pease advise if due

## 2018-07-05 NOTE — Telephone Encounter (Signed)
TCS noted in recall for 06/2020 -- 5 yrs from last last in 2017

## 2018-07-05 NOTE — Telephone Encounter (Signed)
Last colonoscopy was 3 years ago with removal of small adenomas I agree with waiting 5 years from her last exam instead of 3.

## 2018-07-06 ENCOUNTER — Emergency Department (HOSPITAL_COMMUNITY): Payer: Medicare Other

## 2018-07-06 ENCOUNTER — Encounter (HOSPITAL_COMMUNITY): Payer: Self-pay | Admitting: Emergency Medicine

## 2018-07-06 ENCOUNTER — Other Ambulatory Visit: Payer: Self-pay

## 2018-07-06 ENCOUNTER — Emergency Department (HOSPITAL_COMMUNITY)
Admission: EM | Admit: 2018-07-06 | Discharge: 2018-07-06 | Disposition: A | Discharge status: Home / Self Care | Payer: Medicare Other | Attending: Emergency Medicine | Admitting: Emergency Medicine

## 2018-07-06 DIAGNOSIS — M542 Cervicalgia: Secondary | ICD-10-CM | POA: Diagnosis not present

## 2018-07-06 DIAGNOSIS — Z9049 Acquired absence of other specified parts of digestive tract: Secondary | ICD-10-CM | POA: Diagnosis not present

## 2018-07-06 DIAGNOSIS — Z85038 Personal history of other malignant neoplasm of large intestine: Secondary | ICD-10-CM | POA: Diagnosis not present

## 2018-07-06 DIAGNOSIS — Z87891 Personal history of nicotine dependence: Secondary | ICD-10-CM | POA: Diagnosis not present

## 2018-07-06 DIAGNOSIS — I10 Essential (primary) hypertension: Secondary | ICD-10-CM | POA: Diagnosis not present

## 2018-07-06 DIAGNOSIS — Z79899 Other long term (current) drug therapy: Secondary | ICD-10-CM | POA: Insufficient documentation

## 2018-07-06 DIAGNOSIS — F419 Anxiety disorder, unspecified: Secondary | ICD-10-CM | POA: Diagnosis not present

## 2018-07-06 DIAGNOSIS — E119 Type 2 diabetes mellitus without complications: Secondary | ICD-10-CM | POA: Diagnosis not present

## 2018-07-06 DIAGNOSIS — W0110XA Fall on same level from slipping, tripping and stumbling with subsequent striking against unspecified object, initial encounter: Secondary | ICD-10-CM | POA: Diagnosis not present

## 2018-07-06 DIAGNOSIS — R51 Headache: Secondary | ICD-10-CM | POA: Insufficient documentation

## 2018-07-06 DIAGNOSIS — M545 Low back pain: Secondary | ICD-10-CM | POA: Diagnosis not present

## 2018-07-06 DIAGNOSIS — Z8673 Personal history of transient ischemic attack (TIA), and cerebral infarction without residual deficits: Secondary | ICD-10-CM | POA: Diagnosis not present

## 2018-07-06 DIAGNOSIS — Z7984 Long term (current) use of oral hypoglycemic drugs: Secondary | ICD-10-CM | POA: Diagnosis not present

## 2018-07-06 DIAGNOSIS — W19XXXA Unspecified fall, initial encounter: Secondary | ICD-10-CM

## 2018-07-06 LAB — CBG MONITORING, ED: Glucose-Capillary: 62 mg/dL — ABNORMAL LOW (ref 70–99)

## 2018-07-06 NOTE — ED Provider Notes (Signed)
[facility] EMERGENCY DEPARTMENT Provider Note   CSN: [id number] Arrival date & time: 07/06/18  1245   History   Chief Complaint Chief Complaint  Patient presents with  . Fall    HPI [name] is a 78 y.o. female.  Patient presents after her daughter missed a step and tripped and then her daughter fell on top of her while walking in the driveway.  Patient states that she hit her buttocks and back of her head on the asphalt.  States she only hit her head because her daughter fell on top of her.  Patient reports that she is having some low back pain.  States that her head hurts on the area where it was hit but she is not having any vomiting or vision changes.  Patient denies any new neurologic symptoms.  Low back pain is only present with movement.  Patient is currently not on any anticoagulants.     Past Medical History:  Diagnosis Date  . Anemia   . Anxiety   . Arthritis    "all over" (04/19/2013)  . Bleeding stomach ulcer 1980's  . Bloating   . Chronic back pain   . Chronic headaches   . Chronic heartburn   . Chronic neck pain   . Chronic pain   . Colon cancer ([place])   . DDD (degenerative disc disease), lumbar   . Diabetic peripheral neuropathy ([place])    "in my feet" (04/19/2013)  . Dizziness   . Dysphagia   . Family history of anesthesia complication    "daughter has PONV too" (04/19/2013)  . Fibromyalgia   . Gastroesophageal reflux   . Gastroparesis   . Glaucoma, bilateral   . Hiatal hernia    "had it before; had OR; got it again" (04/19/2013)  . History of blood transfusion 1980's   "w/bleeding stomach ulcer" (04/19/2013)  . Hypertension    "for diabetes; don't have high blood pressure" (04/19/2013)  . Migraine    "take RX for it qd" (04/19/2013)  . Nausea   . Pneumonia 04/2012  . PONV (postoperative nausea and vomiting)   . Renal insufficiency   . TIA (transient ischemic attack)    "3-4 before starting RX; none since" (04/19/2013)  . Type II  diabetes mellitus ([place])   . Walking pneumonia ~ 1966    Patient Active Problem List   Diagnosis Date Noted  . Cervical post-laminectomy syndrome 01/19/2017  . Post-concussion headache 10/08/2016  . Post concussion syndrome 07/02/2016  . Skull fracture ([place]) 02/03/2016  . Occipital fracture ([place]) 02/02/2016  . Lactic acidosis   . Ureteral calculus, right   . Nephrolithiasis 12/05/2014  . Elevated lactic acid level 12/05/2014  . Unstable angina ([place]) 04/19/2013  . History of GI bleed 04/19/2013  . Fibromyalgia 04/19/2013  . GERD (gastroesophageal reflux disease) 04/19/2013  . Coronary artery calcification seen on CAT scan 04/19/2013  . Bradycardia 04/19/2013  . History of anemia 12/02/2011  . History of colonic polyps 08/04/2011  . Diabetic gastroparesis ([place]) 08/04/2011  . Achalasia 08/04/2011  . Allergic rhinitis, cause unspecified 08/11/2010  . SPINAL STENOSIS 02/07/2008  . SPONDYLOLYSIS 02/07/2008  . 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 ABDOMINAL ADHESION SURGERY  ~ 2012   "repaired wrap where they did hiatal hernia OR too" 911/04/2013)  . ANTERIOR CERVICAL DISCECTOMY  ~ 2009   "only cleaned out arthritis and spurs" (04/19/2013)  . CATARACT EXTRACTION W/ INTRAOCULAR LENS IMPLANT Left 04/13/2013  .  CHOLECYSTECTOMY  1980's  . COLON SURGERY    . COLONOSCOPY  10/11/2010  . COLONOSCOPY  11/23/2009  . COLONOSCOPY  07/28/2008   W/SNARE  . COLONOSCOPY  06/28/07  . COLONOSCOPY  05/10/07   W/POLYP  . COLONOSCOPY  12/28/00  . COLONOSCOPY N/A 06/29/2015   Procedure: COLONOSCOPY;  Surgeon: Rogene Houston, MD;  Location: [facility] ENDO SUITE;  Service: Endoscopy;  Laterality: N/A;  135  . COLONOSCOPY WITH ESOPHAGOGASTRODUODENOSCOPY (EGD) N/A 05/13/2013   Procedure: COLONOSCOPY WITH ESOPHAGOGASTRODUODENOSCOPY (EGD);  Surgeon: Rogene Houston, MD;  Location: [facility] ENDO SUITE;  Service: Endoscopy;  Laterality: N/A;  855  . HEMICOLECTOMY  2010    ZIEGLER  . HIATAL HERNIA REPAIR  1970's  . LEFT HEART CATHETERIZATION WITH CORONARY ANGIOGRAM N/A 04/19/2013   Procedure: LEFT HEART CATHETERIZATION WITH CORONARY ANGIOGRAM;  Surgeon: Peter M Martin[place], MD;  Location: [facility] CATH LAB;  Service: Cardiovascular;  Laterality: N/A;  . [place]   "had a broken neck" (04/19/2013)  . TONSILLECTOMY  ~ 1953  . UPPER GASTROINTESTINAL ENDOSCOPY  10/11/2010   EGD ED  . UPPER GASTROINTESTINAL ENDOSCOPY  11/23/2009  . UPPER GASTROINTESTINAL ENDOSCOPY  05/10/07   EGD ED  . UPPER GASTROINTESTINAL ENDOSCOPY  08/11/01   EGD ED  . VAGINAL HYSTERECTOMY       OB History    Gravida  4   Para  4   Term  4   Preterm      AB      Living        SAB      TAB      Ectopic      Multiple      Live Births              Home Medications    Prior to Admission medications   Medication Sig Start Date End Date Taking? Authorizing Provider  acetaminophen (TYLENOL) 500 MG tablet Take 500 mg by mouth every 6 (six) hours as needed for mild pain.     [provider]  ALPRAZolam (XANAX XR) 1 MG 24 hr tablet Take 0.5-1 mg by mouth as needed for sleep (Anxiety).     [provider]  Calcium Carbonate-Vitamin D (CALCIUM + D PO) Take 1 tablet by mouth daily. TAKING 1,000 CALCIUM AND 2,000 VIT D    [provider]  cholestyramine (QUESTRAN) 4 g packet Take 4 g by mouth 2 (two) times daily.    [provider]  Cyanocobalamin (VITAMIN B 12 PO) Take 2,000 mcg by mouth daily.    [provider]  cyclobenzaprine (FLEXERIL) 10 MG tablet Take 10 mg by mouth 3 (three) times daily as needed for muscle spasms.    [provider]  dexlansoprazole (DEXILANT) 60 MG capsule Take 1 capsule (60 mg total) by mouth daily before breakfast. 12/25/14   Rehman, Mechele Dawley, MD  docusate sodium (COLACE) 100 MG capsule Take 1 capsule (100 mg total) by mouth daily as needed for mild constipation. 05/05/17    Rehman, Mechele Dawley, MD  DULoxetine (CYMBALTA) 60 MG capsule Take 60 mg by mouth daily. 2 tablets in the morning    [provider]  fenofibrate micronized (LOFIBRA) 134 MG capsule Take 134 mg by mouth daily before breakfast.    [provider]  metFORMIN (GLUMETZA) 500 MG (MOD) 24 hr tablet Take 500 mg by mouth 2 (two) times daily with a meal.     [provider]  metoCLOPramide (REGLAN) 10 MG tablet Take 1 tablet (10 mg total) by mouth 3 (three) times daily before meals. 07/01/16   Rehman, Mechele Dawley, MD  mometasone (NASONEX) 50 MCG/ACT nasal spray Place 2 sprays into the nose daily as needed (Allergies).     [provider]  Multiple Vitamins-Minerals (MULTIVITAMINS THER. W/MINERALS) TABS tablet Take 1 tablet by mouth daily.    [provider]  oxyCODONE-acetaminophen (PERCOCET) 7.5-325 MG tablet Take 1 tablet by mouth every 4 (four) hours as needed for severe pain.    [provider]  pantoprazole (PROTONIX) 40 MG tablet Take 40 mg by mouth daily.    [provider]  polyethylene glycol (MIRALAX / GLYCOLAX) packet Take 17 g by mouth daily as needed for mild constipation.     [provider]  ranolazine (RANEXA) 1000 MG SR tablet Take 1 tablet (1,000 mg total) by mouth 2 (two) times daily. 11/10/17   Herminio Commons, MD  silodosin (RAPAFLO) 8 MG CAPS capsule Take 8 mg by mouth daily.    [provider]  topiramate (TOPAMAX) 100 MG tablet Take 1 tablet (100 mg total) by mouth 2 (two) times daily. 10/08/16   Meredith Staggers, MD    Family History Family History  Problem Relation Age of Onset  . Heart disease Mother   . Diabetes Mother   . Dementia Father   . Healthy Sister   . Diabetes Brother   . Kidney cancer Brother   . Diabetes Brother   . Neuropathy Brother   . Diabetes Daughter   . Diabetes Daughter   . Diabetes Son     Social History Social History   Tobacco Use  . Smoking status: Former Smoker     Packs/day: 1.50    Years: 15.00    Pack years: 22.50    Types: Cigarettes    Last attempt to quit: 03/17/1991    Years since quitting: 27.3  . Smokeless tobacco: Never Used  . Tobacco comment: Patient states that it has ben greater than 20 years since she quit smoking  Substance Use Topics  . Alcohol use: No    Alcohol/week: 0.0 standard drinks  . Drug use: No     Allergies   Botox [onabotulinumtoxina]; Morphine; Aspirin; Cefuroxime axetil; Codeine; Diltiazem; Dronabinol; Shellfish allergy; Ondansetron; and Penicillins   Review of Systems Review of Systems  Gastrointestinal: Negative for nausea and vomiting.  Genitourinary: Negative for dysuria.  Musculoskeletal: Positive for back pain and neck pain.  Neurological: Positive for headaches. Negative for dizziness, speech difficulty, weakness, light-headedness and numbness.  Hematological: Does not bruise/bleed easily.  All other systems reviewed and are negative.    Physical Exam Updated Vital Signs BP (!) 147/64   Pulse (!) 57   Temp 98 F (36.7 C) (Oral)   Resp 12   Ht 4\' 11"  (1.499 m)   Wt 68 kg   SpO2 97%   BMI 30.30 kg/m   Physical Exam Vitals signs and nursing note reviewed.  Constitutional:      Appearance: Normal appearance. She is normal weight.  HENT:     Head: Normocephalic and atraumatic.     Comments: No racoon eyes    Right Ear: External ear normal.     Left Ear: External ear normal.     Nose: Nose normal.     Mouth/Throat:     Mouth: Mucous membranes are moist.     Pharynx: Oropharynx is clear.  Eyes:     Extraocular  Movements: Extraocular movements intact.     Conjunctiva/sclera: Conjunctivae normal.     Pupils: Pupils are equal, round, and reactive to light.  Neck:     Comments: Cervical collar in place  Pulmonary:     Effort: Pulmonary effort is normal.     Breath sounds: Normal breath sounds.  Abdominal:     General: Abdomen is flat.     Palpations: Abdomen is soft.  Skin:     General: Skin is warm.     Capillary Refill: Capillary refill takes less than 2 seconds.  Neurological:     General: No focal deficit present.     Mental Status: She is alert and oriented to person, place, and time.     Cranial Nerves: No cranial nerve deficit.     Motor: No weakness.  Psychiatric:        Mood and Affect: Mood normal.        Behavior: Behavior normal.        Thought Content: Thought content normal.        Judgment: Judgment normal.    ED Treatments / Results  Labs (all labs ordered are listed, but only abnormal results are displayed) Labs Reviewed - No data to display  EKG None  Radiology Dg Lumbar Spine Complete  Result Date: 07/06/2018 CLINICAL DATA:  Lower back, buttock pain. Pt stated she tripped and fell today off sidewalk, landed on her bottom, stated her daughter fell on top of her. Stated chronic back pain, history of DDD, Colon cancer, DM. EXAM: LUMBAR SPINE - COMPLETE 4+ VIEW COMPARISON:  CT, 06/25/2018 FINDINGS: No fracture. 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 No other spondylolisthesis. Moderate loss of disc height at T12-L1. Mild loss of disc height at L1-L2 and L2-L3. Marked loss of disc height at L5-S1. Mild curvature of the lumbar spine to the left, apex at L2-L3. Bowel anastomosis staples are noted in the left mid abdomen. There is been a prior cholecystectomy. IMPRESSION: 1. No fracture or acute finding. 2. 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 3. Degenerative changes as described. Electronically Signed   By: Lajean Manes M.D.   On: 07/06/2018 14:46   Dg Pelvis 1-2 Views  Result Date: 07/06/2018 CLINICAL DATA:  Lower back, buttock pain. Pt stated she tripped and fell today off sidewalk, landed on her bottom, stated her daughter fell on top of her. Stated chronic back pain, history of DDD, Colon cancer, DM. EXAM: PELVIS - 1-2 VIEW COMPARISON:  CT,  06/25/2018 FINDINGS: No fracture. Stable benign bone island posteromedial right ilium. No other bone lesions. Hip joints, SI joints and symphysis pubis are normally spaced and aligned. Bones are demineralized. Soft tissues are unremarkable. IMPRESSION: 1. No fracture or acute finding. Electronically Signed   By: Lajean Manes M.D.   On: 07/06/2018 14:47   Ct Head Wo Contrast  Result Date: 07/06/2018 CLINICAL DATA:  Headache and neck pain after fall. EXAM: CT HEAD WITHOUT CONTRAST CT CERVICAL SPINE WITHOUT CONTRAST TECHNIQUE: Multidetector CT imaging of the head and cervical spine was performed following the standard protocol without intravenous contrast. Multiplanar CT image reconstructions of the cervical spine were also generated. COMPARISON:  CT head dated April 24, 2016. CT cervical spine dated February 02, 2016. FINDINGS: CT HEAD FINDINGS Brain: No evidence of acute infarction, hemorrhage, hydrocephalus, extra-axial collection or mass lesion/mass effect. Stable mild atrophy and chronic microvascular ischemic  changes. Vascular: No hyperdense vessel or unexpected calcification. Skull: Prior suboccipital craniectomy. Negative for fracture or focal lesion. Sinuses/Orbits: No acute finding. Other: None. CT CERVICAL SPINE FINDINGS Alignment: No traumatic malalignment. Unchanged 2 mm anterolisthesis at C7-T1. Skull base and vertebrae: No acute fracture. No primary bone lesion or focal pathologic process. Unchanged chronic type 2 dens fracture. Prior posterior C1-C3 fusion and C4-C7 ACDF. Soft tissues and spinal canal: No prevertebral fluid or swelling. No visible canal hematoma. Disc levels: Solid interbody fusion from C4-C7. Unchanged mild degenerative disc disease and moderate facet arthropathy at C7-T1. Upper chest: Patulous upper esophagus. Other: None. IMPRESSION: 1. No acute intracranial abnormality. Stable atrophy and chronic microvascular ischemic changes. 2. No acute cervical spine fracture. Stable  cervical fusion posteriorly at C1-C4 and anteriorly at C4-C7. Electronically Signed   By: Titus Dubin M.D.   On: 07/06/2018 14:36   Ct Cervical Spine Wo Contrast  Result Date: 07/06/2018 CLINICAL DATA:  Headache and neck pain after fall. EXAM: CT HEAD WITHOUT CONTRAST CT CERVICAL SPINE WITHOUT CONTRAST TECHNIQUE: Multidetector CT imaging of the head and cervical spine was performed following the standard protocol without intravenous contrast. Multiplanar CT image reconstructions of the cervical spine were also generated. COMPARISON:  CT head dated April 24, 2016. CT cervical spine dated February 02, 2016. FINDINGS: CT HEAD FINDINGS Brain: No evidence of acute infarction, hemorrhage, hydrocephalus, extra-axial collection or mass lesion/mass effect. Stable mild atrophy and chronic microvascular ischemic changes. Vascular: No hyperdense vessel or unexpected calcification. Skull: Prior suboccipital craniectomy. Negative for fracture or focal lesion. Sinuses/Orbits: No acute finding. Other: None. CT CERVICAL SPINE FINDINGS Alignment: No traumatic malalignment. Unchanged 2 mm anterolisthesis at C7-T1. Skull base and vertebrae: No acute fracture. No primary bone lesion or focal pathologic process. Unchanged chronic type 2 dens fracture. Prior posterior C1-C3 fusion and C4-C7 ACDF. Soft tissues and spinal canal: No prevertebral fluid or swelling. No visible canal hematoma. Disc levels: Solid interbody fusion from C4-C7. Unchanged mild degenerative disc disease and moderate facet arthropathy at C7-T1. Upper chest: Patulous upper esophagus. Other: None. IMPRESSION: 1. No acute intracranial abnormality. Stable atrophy and chronic microvascular ischemic changes. 2. No acute cervical spine fracture. Stable cervical fusion posteriorly at C1-C4 and anteriorly at C4-C7. Electronically Signed   By: Titus Dubin M.D.   On: 07/06/2018 14:36    Procedures Procedures (including critical care time)  Medications Ordered  in ED Medications - No data to display   Initial Impression / Assessment and Plan / ED Course  I have reviewed the triage vital signs and the nursing notes.  Pertinent labs & imaging results that were available during my care of the patient were reviewed by me and considered in my medical decision making (see chart for details).   78 yo female here after fall. Patient reporting that she hit the back of her head and now has headache with neck pain radiating to shoulders. Denies any open wounds or bleeding. Took 1,000 mg of home tylenol in ED for pain. Will obtain CT head w/o and CT cervical spine to r/o fx or bleed. Patient reporting she hit her tailbone and is complaining of low back pain, will obtain DG lumbar and DG pelvis to r/o fracture.   Imaging with no signs of acute fractures or bleed. Will discharge patient with tylenol for pain and follow up with her primary care doctor. Does report multiple recent falls over the past few months. Patient may benefit from physical therapy and management of polypharmacy. Given strict  return precautions.   [place] Aracelie Addis, DO PGY-2, [facility]   Final Clinical Impressions(s) / ED Diagnoses   Final diagnoses:  Fall    ED Discharge Orders    None       Brinna Divelbiss, [place], DO 07/06/18 1509    Elnora Morrison, MD 07/06/18 [phone]

## 2018-07-06 NOTE — ED Notes (Signed)
MD at bedside. 

## 2018-07-06 NOTE — ED Notes (Signed)
PT dizzy and unsteady on feet.  Notified Dr. Enid Derry and will check cbg and offer fluids.

## 2018-07-06 NOTE — ED Triage Notes (Signed)
Pt fell off sidewalk after tripping over her daughter that was walking beside her.  [address], landed on her buttocks and hit the back of her head.  C/o neck pain (pt reports chronic neck pain) also pain to buttocks.

## 2018-07-06 NOTE — Discharge Instructions (Addendum)
You do not appear to have any broken bones or bleeding from your fall. You may use tylenol and ice as needed for the pain. It will be important for you to follow up with your regular doctor to discuss any changes since the fall.   If you experience any trouble breathing, vomiting and unable to hold down any fluids, trouble with your walk, weakness or you have other concerns, don't hesitate to see your regular doctor or go to the ED after hours.

## 2018-07-26 ENCOUNTER — Encounter (HOSPITAL_COMMUNITY): Payer: Self-pay | Admitting: Emergency Medicine

## 2018-07-26 ENCOUNTER — Other Ambulatory Visit: Payer: Self-pay

## 2018-07-26 ENCOUNTER — Emergency Department (HOSPITAL_COMMUNITY)
Admission: EM | Admit: 2018-07-26 | Discharge: 2018-07-27 | Disposition: A | Discharge status: Home / Self Care | Payer: Medicare Other | Attending: Emergency Medicine | Admitting: Emergency Medicine

## 2018-07-26 ENCOUNTER — Emergency Department (HOSPITAL_COMMUNITY): Payer: Medicare Other

## 2018-07-26 DIAGNOSIS — Y929 Unspecified place or not applicable: Secondary | ICD-10-CM | POA: Insufficient documentation

## 2018-07-26 DIAGNOSIS — S0083XA Contusion of other part of head, initial encounter: Secondary | ICD-10-CM

## 2018-07-26 DIAGNOSIS — Z87891 Personal history of nicotine dependence: Secondary | ICD-10-CM | POA: Insufficient documentation

## 2018-07-26 DIAGNOSIS — E1143 Type 2 diabetes mellitus with diabetic autonomic (poly)neuropathy: Secondary | ICD-10-CM | POA: Diagnosis not present

## 2018-07-26 DIAGNOSIS — W010XXA Fall on same level from slipping, tripping and stumbling without subsequent striking against object, initial encounter: Secondary | ICD-10-CM | POA: Diagnosis not present

## 2018-07-26 DIAGNOSIS — Y939 Activity, unspecified: Secondary | ICD-10-CM | POA: Diagnosis not present

## 2018-07-26 DIAGNOSIS — Z7984 Long term (current) use of oral hypoglycemic drugs: Secondary | ICD-10-CM | POA: Diagnosis not present

## 2018-07-26 DIAGNOSIS — S62142A Displaced fracture of body of hamate [unciform] bone, left wrist, initial encounter for closed fracture: Secondary | ICD-10-CM | POA: Insufficient documentation

## 2018-07-26 DIAGNOSIS — Y999 Unspecified external cause status: Secondary | ICD-10-CM | POA: Insufficient documentation

## 2018-07-26 DIAGNOSIS — S0990XA Unspecified injury of head, initial encounter: Secondary | ICD-10-CM | POA: Diagnosis present

## 2018-07-26 DIAGNOSIS — I10 Essential (primary) hypertension: Secondary | ICD-10-CM | POA: Diagnosis not present

## 2018-07-26 DIAGNOSIS — Z79899 Other long term (current) drug therapy: Secondary | ICD-10-CM | POA: Insufficient documentation

## 2018-07-26 NOTE — ED Triage Notes (Signed)
Pt c/o pain to left hand and left face after fall yesterday on wooden porch

## 2018-07-26 NOTE — ED Provider Notes (Signed)
[facility] EMERGENCY DEPARTMENT Provider Note   CSN: [id number] Arrival date & time: 07/26/18  2026    History   Chief Complaint Chief Complaint  Patient presents with  . Fall    HPI [name] is a 78 y.o. female.     The history is provided by the patient and a relative. No language interpreter was used.  Fall  This is a new problem. The current episode started yesterday. The problem occurs constantly. Nothing aggravates the symptoms. Nothing relieves the symptoms. She has tried nothing for the symptoms. The treatment provided no relief.  Pt reports she fell yesterday and hit her head and her left hand.  Pt complains of pain.    Past Medical History:  Diagnosis Date  . Anemia   . Anxiety   . Arthritis    "all over" (04/19/2013)  . Bleeding stomach ulcer 1980's  . Bloating   . Chronic back pain   . Chronic headaches   . Chronic heartburn   . Chronic neck pain   . Chronic pain   . Colon cancer ([place])   . DDD (degenerative disc disease), lumbar   . Diabetic peripheral neuropathy ([place])    "in my feet" (04/19/2013)  . Dizziness   . Dysphagia   . Family history of anesthesia complication    "daughter has PONV too" (04/19/2013)  . Fibromyalgia   . Gastroesophageal reflux   . Gastroparesis   . Glaucoma, bilateral   . Hiatal hernia    "had it before; had OR; got it again" (04/19/2013)  . History of blood transfusion 1980's   "w/bleeding stomach ulcer" (04/19/2013)  . Hypertension    "for diabetes; don't have high blood pressure" (04/19/2013)  . Migraine    "take RX for it qd" (04/19/2013)  . Nausea   . Pneumonia 04/2012  . PONV (postoperative nausea and vomiting)   . Renal insufficiency   . TIA (transient ischemic attack)    "3-4 before starting RX; none since" (04/19/2013)  . Type II diabetes mellitus ([place])   . Walking pneumonia ~ 1966    Patient Active Problem List   Diagnosis Date Noted  . Cervical post-laminectomy syndrome 01/19/2017  .  Post-concussion headache 10/08/2016  . Post concussion syndrome 07/02/2016  . Skull fracture ([place]) 02/03/2016  . Occipital fracture ([place]) 02/02/2016  . Lactic acidosis   . Ureteral calculus, right   . Nephrolithiasis 12/05/2014  . Elevated lactic acid level 12/05/2014  . Unstable angina ([place]) 04/19/2013  . History of GI bleed 04/19/2013  . Fibromyalgia 04/19/2013  . GERD (gastroesophageal reflux disease) 04/19/2013  . Coronary artery calcification seen on CAT scan 04/19/2013  . Bradycardia 04/19/2013  . History of anemia 12/02/2011  . History of colonic polyps 08/04/2011  . Diabetic gastroparesis ([place]) 08/04/2011  . Achalasia 08/04/2011  . Allergic rhinitis, cause unspecified 08/11/2010  . SPINAL STENOSIS 02/07/2008  . SPONDYLOLYSIS 02/07/2008  . 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 ABDOMINAL ADHESION SURGERY  ~ 2012   "repaired wrap where they did hiatal hernia OR too" 911/04/2013)  . ANTERIOR CERVICAL DISCECTOMY  ~ 2009   "only cleaned out arthritis and spurs" (04/19/2013)  . CATARACT EXTRACTION W/ INTRAOCULAR LENS IMPLANT Left 04/13/2013  . CHOLECYSTECTOMY  1980's  . COLON SURGERY    . COLONOSCOPY  10/11/2010  . COLONOSCOPY  11/23/2009  . COLONOSCOPY  07/28/2008   W/SNARE  . COLONOSCOPY  06/28/07  . COLONOSCOPY  05/10/07  W/POLYP  . COLONOSCOPY  12/28/00  . COLONOSCOPY N/A 06/29/2015   Procedure: COLONOSCOPY;  Surgeon: Rogene Houston, MD;  Location: [facility] END[facility];  Service: Endoscopy;  Laterality: N/A;  135  . COLONOSCOPY WITH ESOPHAGOGASTRODUODENOSCOPY (EGD) N/A 05/13/2013   Procedure: COLONOSCOPY WITH ESOPHAGOGASTRODUODENOSCOPY (EGD);  Surgeon: Rogene Houston, MD;  Location: [facility] ENDO SUITE;  Service: Endoscopy;  Laterality: N/A;  855  . HEMICOLECTOMY  2010   ZIEGLER  . HIATAL HERNIA REPAIR  1970's  . LEFT HEART CATHETERIZATION WITH CORONARY ANGIOGRAM N/A 04/19/2013   Procedure: LEFT HEART CATHETERIZATION WITH CORONARY  ANGIOGRAM;  Surgeon: Peter M Martinique, MD;  Location: [facility] CATH LAB;  Service: Cardiovascular;  Laterality: N/A;  . [place]   "had a broken neck" (04/19/2013)  . TONSILLECTOMY  ~ 1953  . UPPER GASTROINTESTINAL ENDOSCOPY  10/11/2010   EGD ED  . UPPER GASTROINTESTINAL ENDOSCOPY  11/23/2009  . UPPER GASTROINTESTINAL ENDOSCOPY  05/10/07   EGD ED  . UPPER GASTROINTESTINAL ENDOSCOPY  08/11/01   EGD ED  . VAGINAL HYSTERECTOMY       OB History    Gravida  4   Para  4   Term  4   Preterm      AB      Living        SAB      TAB      Ectopic      Multiple      Live Births               Home Medications    Prior to Admission medications   Medication Sig Start Date End Date Taking? Authorizing Provider  acetaminophen (TYLENOL) 500 MG tablet Take 500 mg by mouth every 6 (six) hours as needed for mild pain.     [provider]  ALPRAZolam (XANAX XR) 1 MG 24 hr tablet Take 0.5-1 mg by mouth as needed for sleep (Anxiety).     [provider]  Calcium Carbonate-Vitamin D (CALCIUM + D PO) Take 1 tablet by mouth daily. TAKING 1,000 CALCIUM AND 2,000 VIT D    [provider]  cholestyramine (QUESTRAN) 4 g packet Take 4 g by mouth 2 (two) times daily.    [provider]  Cyanocobalamin (VITAMIN B 12 PO) Take 2,000 mcg by mouth daily.    [provider]  cyclobenzaprine (FLEXERIL) 10 MG tablet Take 10 mg by mouth 3 (three) times daily as needed for muscle spasms.    [provider]  dexlansoprazole (DEXILANT) 60 MG capsule Take 1 capsule (60 mg total) by mouth daily before breakfast. 12/25/14   Rehman, Mechele Dawley, MD  docusate sodium (COLACE) 100 MG capsule Take 1 capsule (100 mg total) by mouth daily as needed for mild constipation. 05/05/17   Rehman, Mechele Dawley, MD  DULoxetine (CYMBALTA) 60 MG capsule Take 60 mg by mouth daily. 2 tablets in the morning    [provider]  fenofibrate micronized  (LOFIBRA) 134 MG capsule Take 134 mg by mouth daily before breakfast.    [provider]  metFORMIN (GLUMETZA) 500 MG (MOD) 24 hr tablet Take 500 mg by mouth 2 (two) times daily with a meal.     [provider]  metoCLOPramide (REGLAN) 10 MG tablet Take 1 tablet (10 mg total) by mouth 3 (three) times daily before meals. 07/01/16   Rehman, Mechele Dawley, MD  mometasone (NASONEX) 50 MCG/ACT nasal spray Place 2 sprays into the  nose daily as needed (Allergies).     [provider]  Multiple Vitamins-Minerals (MULTIVITAMINS THER. W/MINERALS) TABS tablet Take 1 tablet by mouth daily.    [provider]  oxyCODONE-acetaminophen (PERCOCET) 7.5-325 MG tablet Take 1 tablet by mouth every 4 (four) hours as needed for severe pain.    [provider]  pantoprazole (PROTONIX) 40 MG tablet Take 40 mg by mouth daily.    [provider]  polyethylene glycol (MIRALAX / GLYCOLAX) packet Take 17 g by mouth daily as needed for mild constipation.     [provider]  ranolazine (RANEXA) 1000 MG SR tablet Take 1 tablet (1,000 mg total) by mouth 2 (two) times daily. 11/10/17   Herminio Commons, MD  silodosin (RAPAFLO) 8 MG CAPS capsule Take 8 mg by mouth daily.    [provider]  topiramate (TOPAMAX) 100 MG tablet Take 1 tablet (100 mg total) by mouth 2 (two) times daily. 10/08/16   Meredith Staggers, MD    Family History Family History  Problem Relation Age of Onset  . Heart disease Mother   . Diabetes Mother   . Dementia Father   . Healthy Sister   . Diabetes Brother   . Kidney cancer Brother   . Diabetes Brother   . Neuropathy Brother   . Diabetes Daughter   . Diabetes Daughter   . Diabetes Son     Social History Social History   Tobacco Use  . Smoking status: Former Smoker    Packs/day: 1.50    Years: 15.00    Pack years: 22.50    Types: Cigarettes    Last attempt to quit: 03/17/1991    Years since quitting: 27.3  . Smokeless  tobacco: Never Used  . Tobacco comment: Patient states that it has ben greater than 20 years since she quit smoking  Substance Use Topics  . Alcohol use: No    Alcohol/week: 0.0 standard drinks  . Drug use: No     Allergies   Botox [onabotulinumtoxina]; Morphine; Aspirin; Cefuroxime axetil; Codeine; Diltiazem; Dronabinol; Shellfish allergy; Ondansetron; and Penicillins   Review of Systems Review of Systems  Skin: Positive for color change.  All other systems reviewed and are negative.    Physical Exam Updated Vital Signs BP (!) 191/65 (BP Location: Right Arm)   Pulse (!) 58   Temp 98.2 F (36.8 C) (Oral)   Resp 18   Ht 4\' 11"  (1.499 m)   Wt 70.8 kg   SpO2 97%   BMI 31.51 kg/m   Physical Exam Vitals signs reviewed.  HENT:     Head:     Comments: Bruised area left forehead.  Abrasion left face    Nose: Nose normal.     Mouth/Throat:     Mouth: Mucous membranes are moist.  Neck:     Musculoskeletal: Normal range of motion.  Cardiovascular:     Rate and Rhythm: Normal rate.  Pulmonary:     Effort: Pulmonary effort is normal.  Abdominal:     General: Abdomen is flat.  Musculoskeletal:        General: Swelling and tenderness present.     Comments: Swollen tender left hand.   Skin:    General: Skin is warm.  Neurological:     General: No focal deficit present.     Mental Status: She is alert.  Psychiatric:        Mood and Affect: Mood normal.      ED Treatments /  Results  Labs (all labs ordered are listed, but only abnormal results are displayed) Labs Reviewed - No data to display  EKG None  Radiology No results found.  Procedures Procedures (including critical care time)  Medications Ordered in ED Medications - No data to display   Initial Impression / Assessment and Plan / ED Course  I have reviewed the triage vital signs and the nursing notes.  Pertinent labs & imaging results that were available during my care of the patient were  reviewed by me and considered in my medical decision making (see chart for details).       MDM  Xray shows possible hamate fracture, given the area of swelling I suspect acute fracture.   Pt placed in a splint.  Pt has Rx for 180 Percocet filled on 2/4.   Pt advised to follow up with Dr. Aline Brochure for recheck of hand injury in 1 week.   No further pain RX.    Final Clinical Impressions(s) / ED Diagnoses   Final diagnoses:  Contusion of face, initial encounter  Closed displaced fracture of hamate of left wrist, unspecified portion of hamate, initial encounter    ED Discharge Orders    None    An After Visit Summary was printed and given to the patient.    Fransico Meadow, [place] 07/27/18 0033    Hayden Rasmussen, MD 07/27/18 [phone]

## 2018-07-27 MED ORDER — ACETAMINOPHEN 325 MG PO TABS
650.0000 mg | ORAL_TABLET | Freq: Once | ORAL | Status: AC
  Administered 2018-07-27: 650 mg via ORAL
  Filled 2018-07-27: qty 2

## 2018-07-27 NOTE — Discharge Instructions (Signed)
Schedule to see the Orthopaedist for recheck in 1 week.

## 2018-07-28 ENCOUNTER — Ambulatory Visit (INDEPENDENT_AMBULATORY_CARE_PROVIDER_SITE_OTHER): Payer: Medicare Other | Admitting: Orthopedic Surgery

## 2018-07-28 VITALS — BP 143/85 | HR 63 | Ht 59.0 in | Wt 146.0 lb

## 2018-07-28 DIAGNOSIS — S62145A Nondisplaced fracture of body of hamate [unciform] bone, left wrist, initial encounter for closed fracture: Secondary | ICD-10-CM

## 2018-07-28 NOTE — Progress Notes (Signed)
NEW PATIENT OFFICE VISIT  Chief Complaint  Patient presents with  . Wrist Injury    ER follow up on left wrist fracture, DOI 07-25-18.    78 yo female pain right hand  3 days  Non radiating  Secondary to falling  Quality dull Rate mild - moderate    Review of Systems  Musculoskeletal: Positive for myalgias.  Skin: Positive for rash.  Neurological: Negative for tingling.     Past Medical History:  Diagnosis Date  . Anemia   . Anxiety   . Arthritis    "all over" (04/19/2013)  . Bleeding stomach ulcer 1980's  . Bloating   . Chronic back pain   . Chronic headaches   . Chronic heartburn   . Chronic neck pain   . Chronic pain   . Colon cancer ([place])   . DDD (degenerative disc disease), lumbar   . Diabetic peripheral neuropathy ([place])    "in my feet" (04/19/2013)  . Dizziness   . Dysphagia   . Family history of anesthesia complication    "daughter has PONV too" (04/19/2013)  . Fibromyalgia   . Gastroesophageal reflux   . Gastroparesis   . Glaucoma, bilateral   . Hiatal hernia    "had it before; had OR; got it again" (04/19/2013)  . History of blood transfusion 1980's   "w/bleeding stomach ulcer" (04/19/2013)  . Hypertension    "for diabetes; don't have high blood pressure" (04/19/2013)  . Migraine    "take RX for it qd" (04/19/2013)  . Nausea   . Pneumonia 04/2012  . PONV (postoperative nausea and vomiting)   . Renal insufficiency   . TIA (transient ischemic attack)    "3-4 before starting RX; none since" (04/19/2013)  . Type II diabetes mellitus ([place])   . Walking pneumonia ~ 1966    Past Surgical History:  Procedure Laterality Date  . ABDOMINAL ADHESION SURGERY  ~ 2012   "repaired wrap where they did hiatal hernia OR too" 911/04/2013)  . ANTERIOR CERVICAL DISCECTOMY  ~ 2009   "only cleaned out arthritis and spurs" (04/19/2013)  . CATARACT EXTRACTION W/ INTRAOCULAR LENS IMPLANT Left 04/13/2013  . CHOLECYSTECTOMY  1980's  . COLON SURGERY    .  COLONOSCOPY  10/11/2010  . COLONOSCOPY  11/23/2009  . COLONOSCOPY  07/28/2008   W/SNARE  . COLONOSCOPY  06/28/07  . COLONOSCOPY  05/10/07   W/POLYP  . COLONOSCOPY  12/28/00  . COLONOSCOPY N/A 06/29/2015   Procedure: COLONOSCOPY;  Surgeon: Rogene Houston, MD;  [facility]: [facility];  Service: Endoscopy;  Laterality: N/A;  135  . COLONOSCOPY WITH ESOPHAGOGASTRODUODENOSCOPY (EGD) N/A 05/13/2013   Procedure: COLONOSCOPY WITH ESOPHAGOGASTRODUODENOSCOPY (EGD);  Surgeon: Rogene Houston, MD;  Location: [facility];  Service: Endoscopy;  Laterality: N/A;  855  . HEMICOLECTOMY  2010   ZIEGLER  . HIATAL HERNIA REPAIR  1970's  . LEFT HEART CATHETERIZATION WITH CORONARY ANGIOGRAM N/A 04/19/2013   Procedure: LEFT HEART CATHETERIZATION WITH CORONARY ANGIOGRAM;  Surgeon: Peter M Martinique, MD;  Location: [facility] CATH LAB;  Service: Cardiovascular;  Laterality: N/A;  . [place]   "had a broken neck" (04/19/2013)  . TONSILLECTOMY  ~ 1953  . UPPER GASTROINTESTINAL ENDOSCOPY  10/11/2010   EGD ED  . UPPER GASTROINTESTINAL ENDOSCOPY  11/23/2009  . UPPER GASTROINTESTINAL ENDOSCOPY  05/10/07   EGD ED  . UPPER GASTROINTESTINAL ENDOSCOPY  08/11/01   EGD ED  . VAGINAL HYSTERECTOMY  Family History  Problem Relation Age of Onset  . Heart disease Mother   . Diabetes Mother   . Dementia Father   . Healthy Sister   . Diabetes Brother   . Kidney cancer Brother   . Diabetes Brother   . Neuropathy Brother   . Diabetes Daughter   . Diabetes Daughter   . Diabetes Son    Social History   Tobacco Use  . Smoking status: Former Smoker    Packs/day: 1.50    Years: 15.00    Pack years: 22.50    Types: Cigarettes    Last attempt to quit: 03/17/1991    Years since quitting: 27.3  . Smokeless tobacco: Never Used  . Tobacco comment: Patient states that it has ben greater than 20 years since she quit smoking  Substance Use Topics  . Alcohol use: No    Alcohol/week: 0.0 standard  drinks  . Drug use: No    Allergies  Allergen Reactions  . Botox [Onabotulinumtoxina]     Patient states that it became loose in her stomach , and it caused multiple problems.  . Morphine Anaphylaxis and Other (See Comments)    "it will kill me" "made me stop breathing"  . Aspirin Other (See Comments)    Gi bleed   . Cefuroxime Axetil Nausea And Vomiting  . Codeine Nausea And Vomiting  . Diltiazem Nausea And Vomiting  . Dronabinol Nausea And Vomiting and Palpitations    Patient also states that she had palpatations  . Shellfish Allergy Other (See Comments)    Blood sugar drops  . Ondansetron Hives  . Penicillins Rash    Did it involve swelling of the face/tongue/throat, SOB, or low BP? No Did it involve sudden or severe rash/hives, skin peeling, or any reaction on the inside of your mouth or nose? Yes Did you need to seek medical attention at a hospital or doctor's office? Yes When did it last happen?Over 10 years If all above answers are "NO", may proceed with cephalosporin use.     No outpatient medications have been marked as taking for the 07/28/18 encounter (Office Visit) with Carole Civil, MD.    BP (!) 143/85   Pulse 63   Ht 4\' 11"  (1.499 m)   Wt 146 lb (66.2 kg)   BMI 29.49 kg/m   Physical Exam Vitals signs reviewed.  Constitutional:      Appearance: Normal appearance. She is well-developed.  Neurological:     Mental Status: She is alert and oriented to person, place, and time.  Psychiatric:        Attention and Perception: Attention normal.        Mood and Affect: Mood and affect normal.        Speech: Speech normal.        Behavior: Behavior normal.        Thought Content: Thought content normal.        Judgment: Judgment normal.     Ortho Exam  Right hand no joint subluxation no tenderness muscle tone normal no atrophy or tremor skin intact sensation normal pulses excellent  Left hand tenderness over the hamate range of motion normal  instability none muscle tone normal no tremor skin rash up to the midforearm sensation normal pulses and color normal  MEDICAL DECISION SECTION  Xrays were done at [facility] 4 views left hand  My independent reading of xrays:  4 views of the left hand.  There is diffuse osteopenia  there is a dorsal fracture near the carpal bones at the carpometacarpal joint consistent with probable hamate fracture  Encounter Diagnosis  Name Primary?  . Closed nondisplaced fracture of body of hamate of left wrist, initial encounter Yes    PLAN: (Rx., injectx, surgery, frx, mri/ct) X-ray in 6 weeks wear splint first 3 weeks full-time then after 3 weeks can remove at night during the 6-week.  Can bathe with the brace off  No orders of the defined types were placed in this encounter.   Arther Abbott, MD  07/28/2018 3:39 PM

## 2018-09-06 DIAGNOSIS — S62145A Nondisplaced fracture of body of hamate [unciform] bone, left wrist, initial encounter for closed fracture: Secondary | ICD-10-CM | POA: Insufficient documentation

## 2018-09-08 ENCOUNTER — Ambulatory Visit (INDEPENDENT_AMBULATORY_CARE_PROVIDER_SITE_OTHER): Payer: Medicare Other

## 2018-09-08 ENCOUNTER — Encounter: Payer: Self-pay | Admitting: Orthopedic Surgery

## 2018-09-08 ENCOUNTER — Other Ambulatory Visit: Payer: Self-pay

## 2018-09-08 ENCOUNTER — Ambulatory Visit (INDEPENDENT_AMBULATORY_CARE_PROVIDER_SITE_OTHER): Payer: Medicare Other | Admitting: Orthopedic Surgery

## 2018-09-08 DIAGNOSIS — M5023 Other cervical disc displacement, cervicothoracic region: Secondary | ICD-10-CM | POA: Insufficient documentation

## 2018-09-08 DIAGNOSIS — S62145D Nondisplaced fracture of body of hamate [unciform] bone, left wrist, subsequent encounter for fracture with routine healing: Secondary | ICD-10-CM

## 2018-09-08 DIAGNOSIS — G245 Blepharospasm: Secondary | ICD-10-CM | POA: Insufficient documentation

## 2018-09-08 NOTE — Progress Notes (Signed)
Fracture care follow-up left wrist injury on February 16  Encounter Diagnosis  Name Primary?  . Closed nondisplaced fracture of body of hamate of left wrist with routine healing, subsequent encounter     Fracture care  X-ray shows resolution of the dorsal fracture probably of the hamate or capitate.  Clinical exam was normal  Patient was discharged with normal activities no restrictions follow-up as needed

## 2018-09-14 ENCOUNTER — Ambulatory Visit (INDEPENDENT_AMBULATORY_CARE_PROVIDER_SITE_OTHER): Payer: Medicare Other | Admitting: Internal Medicine

## 2018-09-27 ENCOUNTER — Encounter (INDEPENDENT_AMBULATORY_CARE_PROVIDER_SITE_OTHER): Payer: Self-pay | Admitting: Internal Medicine

## 2018-09-27 ENCOUNTER — Ambulatory Visit (INDEPENDENT_AMBULATORY_CARE_PROVIDER_SITE_OTHER): Payer: Medicare Other | Admitting: Internal Medicine

## 2018-09-27 ENCOUNTER — Other Ambulatory Visit: Payer: Self-pay

## 2018-09-27 VITALS — BP 176/87 | HR 57 | Temp 98.3°F | Resp 18 | Ht 59.0 in | Wt 151.0 lb

## 2018-09-27 DIAGNOSIS — R1310 Dysphagia, unspecified: Secondary | ICD-10-CM

## 2018-09-27 DIAGNOSIS — E1143 Type 2 diabetes mellitus with diabetic autonomic (poly)neuropathy: Secondary | ICD-10-CM

## 2018-09-27 DIAGNOSIS — K219 Gastro-esophageal reflux disease without esophagitis: Secondary | ICD-10-CM | POA: Diagnosis not present

## 2018-09-27 DIAGNOSIS — K3184 Gastroparesis: Secondary | ICD-10-CM | POA: Diagnosis not present

## 2018-09-27 DIAGNOSIS — R1319 Other dysphagia: Secondary | ICD-10-CM

## 2018-09-27 NOTE — Patient Instructions (Signed)
Esophagogastroduodenoscopy with esophageal dilation to be scheduled in near future. Please keep a written record on days when you take metoclopramide and on days when you fall down. Please find out if Zantac/ranitidine is not available in which case I will be glad to write a prescription. Please discuss with Dr. Karie Kirks about referral to a neurologist for falling episodes.

## 2018-09-27 NOTE — Progress Notes (Signed)
Presenting complaint;  Dysphagia heartburn and abdominal pain.  Database and subjective:  Patient is 78 year old Caucasian female with multiple medical problems who is here for scheduled visit accompanied by her daughter Jolayne Panther..  Patient was last seen on 03/15/2018.  During that visit she was complaining of dysphagia.  She underwent barium study which revealed somewhat dilated esophagus with esophageal dysmotility and barium pill passed from oral cavity to stomach without any delay.  Was decided to monitor this symptom. She says she is having swallowing difficulty virtually at every meal.  She regurgitates food all the time and just swallows it back.  She has at least 2-3 episodes a week when she vomits food.  She also complains of daily heartburn.  She says ranitidine was working very well along with dexlansoprazole but ranitidine has been taken off the market.  She is not taking dexlansoprazole in the morning and pantoprazole in the evening.  She also complains of burning pain primarily across her upper abdomen.  She says diarrhea has resolved.  She is taking cholestyramine once a day usually at night.  She has anywhere from 1-3 bowel movements per day and then she can go 2 or 3 days without a bowel movement.  She has Percocet prescription for headache shoulder neck pain but she does not take it often. She says her appetite is not good.  Her daughter Karna Christmas states that she eats at night.  She has gained 5 pounds since her last visit. She denies melena or rectal bleeding. Patient's last EGD was back in December 2014 revealing scant amount of food debris in the stomach patent pylorus and antral gastritis.  She also had lose fundal wrap and hyperplastic appearing polyps at gastric fundus.  Gastric biopsy revealed H. pylori gastritis and she was treated with Pylera.  Patient's daughter Karna Christmas states that she has had multiple falling episodes since she was last seen about 6 months ago.  She fell on January 2020  and was seen in the emergency room and had multiple imaging studies.  She fell again on 07/26/2018 when she was felt to have fracture to left dorsal hamate.  She was evaluated by Dr. Earl Lagos.  She had splint removed last week. Daughter states she has been having falling episodes for the last few years.  But lately it is been occurring very frequently.  She has not passed out with any of the spells.  One episode occurred when she and her daughter both fell because there cans and tangled.  On other occasions she states she has lost her balance.   Current Medications: Outpatient Encounter Medications as of 09/27/2018  Medication Sig  . acetaminophen (TYLENOL) 500 MG tablet Take 500 mg by mouth every 6 (six) hours as needed for mild pain.   Marland Kitchen ALPRAZolam (XANAX XR) 1 MG 24 hr tablet Take 0.5-1 mg by mouth as needed for sleep (Anxiety).   . Calcium Carbonate-Vitamin D (CALCIUM + D PO) Take 1 tablet by mouth daily. TAKING 1,000 CALCIUM AND 2,000 VIT D  . cholestyramine (QUESTRAN) 4 g packet Take 4 g by mouth at bedtime.   . Cyanocobalamin (VITAMIN B 12 PO) Take 2,000 mcg by mouth daily.  . cyclobenzaprine (FLEXERIL) 10 MG tablet Take 10 mg by mouth at bedtime. *May take up to three times daily as needed for muscle spasms  . dexlansoprazole (DEXILANT) 60 MG capsule Take 1 capsule (60 mg total) by mouth daily before breakfast.  . DULoxetine (CYMBALTA) 60 MG capsule Take 60 mg  by mouth every morning.   . fenofibrate micronized (LOFIBRA) 134 MG capsule Take 134 mg by mouth daily before breakfast.  . metFORMIN (GLUCOPHAGE) 500 MG tablet Take 500 mg by mouth 2 (two) times daily with a meal.  . metoCLOPramide (REGLAN) 10 MG tablet Take 1 tablet (10 mg total) by mouth 3 (three) times daily before meals.  . mometasone (NASONEX) 50 MCG/ACT nasal spray Place 2 sprays into the nose daily as needed (Allergies).   . Multiple Vitamins-Minerals (MULTIVITAMINS THER. W/MINERALS) TABS tablet Take 1 tablet by mouth  daily.  Marland Kitchen oxyCODONE-acetaminophen (PERCOCET) 7.5-325 MG tablet Take 1 tablet by mouth every 4 (four) hours as needed for severe pain.  . pantoprazole (PROTONIX) 40 MG tablet Take 40 mg by mouth daily.  . polyethylene glycol (MIRALAX / GLYCOLAX) packet Take 17 g by mouth daily as needed for mild constipation.   . ranolazine (RANEXA) 1000 MG SR tablet Take 1 tablet (1,000 mg total) by mouth 2 (two) times daily.  . silodosin (RAPAFLO) 8 MG CAPS capsule Take 8 mg by mouth at bedtime.   . topiramate (TOPAMAX) 100 MG tablet Take 1 tablet (100 mg total) by mouth 2 (two) times daily. (Patient taking differently: Take 100 mg by mouth at bedtime. )  . Travoprost, BAK Free, (TRAVATAN Z) 0.004 % SOLN ophthalmic solution Place 1 drop into both eyes at bedtime.    No facility-administered encounter medications on file as of 09/27/2018.      Objective: Blood pressure (!) 176/87, pulse (!) 57, temperature 98.3 F (36.8 C), temperature source Oral, resp. rate 18, height 4\' 11"  (1.499 m), weight 151 lb (68.5 kg). Patient is alert and in no acute distress. She does not have abnormal movements to her lip tongue or hands. Conjunctiva is pink. Sclera is nonicteric Oropharyngeal mucosa is normal. No neck masses or thyromegaly noted. Cardiac exam with regular rhythm normal S1 and S2. No murmur or gallop noted. Lungs are clear to auscultation. Abdomen is symmetrical.  She has right subcostal and lower midline scars.  Bowel sounds are normal.  She does not have succussion splash over her epigastric region.  Abdomen is soft and nontender with organomegaly or masses. No LE edema or clubbing noted.   Assessment:  #1.  Esophageal dysphagia.  She has history of achalasia as well as history of fundal wrap many years ago.  She may benefit from esophageal dilation and if that therapy does not work other options would be calcium channel blockers or Botox therapy.  #2.  Chronic GERD.  Symptoms are poorly controlled  because of esophageal dysmotility and impaired esophageal clearance.  Her daughter will try to find out if her ranitidine is now available in which case I will be glad to write a prescription.  #3.  Diabetic gastroparesis.  He has undergone multiple Botox sessions to pylorus.  She had significant pain when she had her last session at and [facility].  Dr. Derrill Kay has decided not to give her any more Botox. She is presently on metoclopramide.  She is not having any obvious side effects but I am concerned about her falling episodes.  #4.  Multiple falling episodes.  So far she has been lucky that she did not have any serious injury or fracture.  I do not believe her falling episodes are due to metoclopramide but this medication should not be taken on schedule until evaluation has been completed.   Plan:  Esophagogastroduodenoscopy with esophageal dilation under monitored anesthesia care in near future. Take  metoclopramide only on as-needed basis. Patient's daughter Karna Christmas advised to keep a record on days when she takes metoclopramide and also when she has falls. I recommended that Dr. Karie Kirks be contacted and see if she would benefit from neurologic consultation or physical therapy. Office visit in 3 months.

## 2018-09-29 ENCOUNTER — Other Ambulatory Visit (INDEPENDENT_AMBULATORY_CARE_PROVIDER_SITE_OTHER): Payer: Self-pay | Admitting: *Deleted

## 2018-09-29 DIAGNOSIS — R1310 Dysphagia, unspecified: Secondary | ICD-10-CM | POA: Insufficient documentation

## 2018-09-30 NOTE — Patient Instructions (Signed)
[name]  09/30/2018     @PREFPERIOPPHARMACY @   Your procedure is scheduled on  10/06/2018  Report to [facility] at  830  A.M.  Call this number if you have problems the morning of surgery:  [phone]   Remember:  Follow the diet instructions given to you by Dr Olevia Perches office.                      Take these medicines the morning of surgery with A SIP OF WATER  Xanax, dexilant, cymbalta, reglan, percocet( if needed), protonix.    Do not wear jewelry, make-up or nail polish.  Do not wear lotions, powders, or perfumes, or deodorant.  Do not shave 48 hours prior to surgery.  Men may shave face and neck.  Do not bring valuables to the hospital.  [facility] is not responsible for any belongings or valuables.  Contacts, dentures or bridgework may not be worn into surgery.  Leave your suitcase in the car.  After surgery it may be brought to your room.  For patients admitted to the hospital, discharge time will be determined by your treatment team.  Patients discharged the day of surgery will not be allowed to drive home.   Name and phone number of your driver:   family Special instructions:  DO NOT take any medications for diabetes the morning of your procedure.  Please read over the following fact sheets that you were given. Anesthesia Post-op Instructions and Care and Recovery After Surgery       Upper Endoscopy, Adult, Care After This sheet gives you information about how to care for yourself after your procedure. Your health care provider may also give you more specific instructions. If you have problems or questions, contact your health care provider. What can I expect after the procedure? After the procedure, it is common to have:  A sore throat.  Mild stomach pain or discomfort.  Bloating.  Nausea. Follow these instructions at home:   Follow instructions from your health care provider about what to eat or drink after your procedure.  Return  to your normal activities as told by your health care provider. Ask your health care provider what activities are safe for you.  Take over-the-counter and prescription medicines only as told by your health care provider.  Do not drive for 24 hours if you were given a sedative during your procedure.  Keep all follow-up visits as told by your health care provider. This is important. Contact a health care provider if you have:  A sore throat that lasts longer than one day.  Trouble swallowing. Get help right away if:  You vomit blood or your vomit looks like coffee grounds.  You have: ? A fever. ? Bloody, black, or tarry stools. ? A severe sore throat or you cannot swallow. ? Difficulty breathing. ? Severe pain in your chest or abdomen. Summary  After the procedure, it is common to have a sore throat, mild stomach discomfort, bloating, and nausea.  Do not drive for 24 hours if you were given a sedative during the procedure.  Follow instructions from your health care provider about what to eat or drink after your procedure.  Return to your normal activities as told by your health care provider. This information is not intended to replace advice given to you by your health care provider. Make sure you discuss any questions you have with your health care provider.  Document Released: 11/25/2011 Document Revised: 10/26/2017 Document Reviewed: 10/26/2017 Elsevier Interactive Patient Education  2019 [employer] Esophageal Dilatation Esophageal dilatation, also called esophageal dilation, is a procedure to widen or open (dilate) a blocked or narrowed part of the esophagus. The esophagus is the part of the body that moves food and liquid from the mouth to the stomach. You may need this procedure if:  You have a buildup of scar tissue in your esophagus that makes it difficult, painful, or impossible to swallow. This can be caused by gastroesophageal reflux disease (GERD).  You have  cancer of the esophagus.  There is a problem with how food moves through your esophagus. In some cases, you may need this procedure repeated at a later time to dilate the esophagus gradually. Tell a health care provider about:  Any allergies you have.  All medicines you are taking, including vitamins, herbs, eye drops, creams, and over-the-counter medicines.  Any problems you or family members have had with anesthetic medicines.  Any blood disorders you have.  Any surgeries you have had.  Any medical conditions you have.  Any antibiotic medicines you are required to take before dental procedures.  Whether you are pregnant or may be pregnant. What are the risks? Generally, this is a safe procedure. However, problems may occur, including:  Bleeding due to a tear in the lining of the esophagus.  A hole (perforation) in the esophagus. What happens before the procedure?  Follow instructions from your health care provider about eating or drinking restrictions.  Ask your health care provider about changing or stopping your regular medicines. This is especially important if you are taking diabetes medicines or blood thinners.  Plan to have someone take you home from the hospital or clinic.  Plan to have a responsible adult care for you for at least 24 hours after you leave the hospital or clinic. This is important. What happens during the procedure?  You may be given a medicine to help you relax (sedative).  A numbing medicine may be sprayed into the back of your throat, or you may gargle the medicine.  Your health care provider may perform the dilatation using various surgical instruments, such as: ? Simple dilators. This instrument is carefully placed in the esophagus to stretch it. ? Guided wire bougies. This involves using an endoscope to insert a wire into the esophagus. A dilator is passed over this wire to enlarge the esophagus. Then the wire is removed. ? Balloon  dilators. An endoscope with a small balloon at the end is inserted into the esophagus. The balloon is inflated to stretch the esophagus and open it up. The procedure may vary among health care providers and hospitals. What happens after the procedure?  Your blood pressure, heart rate, breathing rate, and blood oxygen level will be monitored until the medicines you were given have worn off.  Your throat may feel slightly sore and numb. This will improve slowly over time.  You will not be allowed to eat or drink until your throat is no longer numb.  When you are able to drink, urinate, and sit on the edge of the bed without nausea or dizziness, you may be able to return home. Follow these instructions at home:  Take over-the-counter and prescription medicines only as told by your health care provider.  Do not drive for 24 hours if you were given a sedative during your procedure.  You should have a responsible adult with you for 24 hours after the  procedure.  Follow instructions from your health care provider about any eating or drinking restrictions.  Do not use any products that contain nicotine or tobacco, such as cigarettes and e-cigarettes. If you need help quitting, ask your health care provider.  Keep all follow-up visits as told by your health care provider. This is important. Get help right away if you:  Have a fever.  Have chest pain.  Have pain that is not relieved by medication.  Have trouble breathing.  Have trouble swallowing.  Vomit blood. Summary  Esophageal dilatation, also called esophageal dilation, is a procedure to widen or open (dilate) a blocked or narrowed part of the esophagus.  Plan to have someone take you home from the hospital or clinic.  For this procedure, a numbing medicine may be sprayed into the back of your throat, or you may gargle the medicine.  Do not drive for 24 hours if you were given a sedative during your procedure. This  information is not intended to replace advice given to you by your health care provider. Make sure you discuss any questions you have with your health care provider. Document Released: 07/17/2005 Document Revised: 03/31/2017 Document Reviewed: 03/31/2017 Elsevier Interactive Patient Education  2019 [employer].

## 2018-10-01 ENCOUNTER — Encounter (HOSPITAL_COMMUNITY): Payer: Self-pay

## 2018-10-01 ENCOUNTER — Other Ambulatory Visit (INDEPENDENT_AMBULATORY_CARE_PROVIDER_SITE_OTHER): Payer: Self-pay | Admitting: Internal Medicine

## 2018-10-01 ENCOUNTER — Other Ambulatory Visit: Payer: Self-pay

## 2018-10-01 ENCOUNTER — Encounter (HOSPITAL_COMMUNITY)
Admission: RE | Admit: 2018-10-01 | Discharge: 2018-10-01 | Disposition: A | Discharge status: Home / Self Care | Payer: Medicare Other | Source: Ambulatory Visit | Attending: Internal Medicine | Admitting: Internal Medicine

## 2018-10-01 DIAGNOSIS — Z01812 Encounter for preprocedural laboratory examination: Secondary | ICD-10-CM | POA: Diagnosis present

## 2018-10-01 DIAGNOSIS — R1310 Dysphagia, unspecified: Secondary | ICD-10-CM

## 2018-10-01 LAB — CBC WITH DIFFERENTIAL/PLATELET
Abs Immature Granulocytes: 0.04 10*3/uL (ref 0.00–0.07)
Basophils Absolute: 0 10*3/uL (ref 0.0–0.1)
Basophils Relative: 0 %
Eosinophils Absolute: 0.1 10*3/uL (ref 0.0–0.5)
Eosinophils Relative: 2 %
HCT: 35.9 % — ABNORMAL LOW (ref 36.0–46.0)
Hemoglobin: 11.1 g/dL — ABNORMAL LOW (ref 12.0–15.0)
Immature Granulocytes: 1 %
Lymphocytes Relative: 23 %
Lymphs Abs: 1.9 10*3/uL (ref 0.7–4.0)
MCH: 31.1 pg (ref 26.0–34.0)
MCHC: 30.9 g/dL (ref 30.0–36.0)
MCV: 100.6 fL — ABNORMAL HIGH (ref 80.0–100.0)
Monocytes Absolute: 0.4 10*3/uL (ref 0.1–1.0)
Monocytes Relative: 5 %
Neutro Abs: 5.6 10*3/uL (ref 1.7–7.7)
Neutrophils Relative %: 69 %
Platelets: 216 10*3/uL (ref 150–400)
RBC: 3.57 MIL/uL — ABNORMAL LOW (ref 3.87–5.11)
RDW: 13.5 % (ref 11.5–15.5)
WBC: 8 10*3/uL (ref 4.0–10.5)
nRBC: 0 % (ref 0.0–0.2)

## 2018-10-01 LAB — BASIC METABOLIC PANEL
Anion gap: 12 (ref 5–15)
BUN: 9 mg/dL (ref 8–23)
CO2: 19 mmol/L — ABNORMAL LOW (ref 22–32)
Calcium: 9.5 mg/dL (ref 8.9–10.3)
Chloride: 104 mmol/L (ref 98–111)
Creatinine, Ser: 0.93 mg/dL (ref 0.44–1.00)
GFR calc Af Amer: >60 mL/min (ref >60)
GFR calc non Af Amer: 59 mL/min — ABNORMAL LOW (ref >60)
Glucose, Bld: 182 mg/dL — ABNORMAL HIGH (ref 70–99)
Potassium: 3.4 mmol/L — ABNORMAL LOW (ref 3.5–5.1)
Sodium: 135 mmol/L (ref 135–145)

## 2018-10-01 MED ORDER — RANITIDINE HCL 300 MG PO TABS: 300.0000 mg | ORAL_TABLET | Freq: Every day | ORAL | 3 refills | Status: DC

## 2018-10-06 ENCOUNTER — Other Ambulatory Visit: Payer: Self-pay

## 2018-10-06 ENCOUNTER — Ambulatory Visit (HOSPITAL_COMMUNITY)
Admission: RE | Admit: 2018-10-06 | Discharge: 2018-10-06 | Disposition: A | Discharge status: Home / Self Care | Payer: Medicare Other | Attending: Internal Medicine | Admitting: Internal Medicine

## 2018-10-06 ENCOUNTER — Encounter (HOSPITAL_COMMUNITY)
Admission: RE | Disposition: A | Discharge status: Home / Self Care | Payer: Self-pay | Source: Home / Self Care | Attending: Internal Medicine

## 2018-10-06 ENCOUNTER — Encounter (HOSPITAL_COMMUNITY): Payer: Self-pay | Admitting: Anesthesiology

## 2018-10-06 ENCOUNTER — Ambulatory Visit (HOSPITAL_COMMUNITY): Payer: Medicare Other | Admitting: Anesthesiology

## 2018-10-06 DIAGNOSIS — Z85038 Personal history of other malignant neoplasm of large intestine: Secondary | ICD-10-CM | POA: Insufficient documentation

## 2018-10-06 DIAGNOSIS — K228 Other specified diseases of esophagus: Secondary | ICD-10-CM | POA: Diagnosis not present

## 2018-10-06 DIAGNOSIS — Z79899 Other long term (current) drug therapy: Secondary | ICD-10-CM | POA: Diagnosis not present

## 2018-10-06 DIAGNOSIS — K210 Gastro-esophageal reflux disease with esophagitis: Secondary | ICD-10-CM | POA: Insufficient documentation

## 2018-10-06 DIAGNOSIS — F419 Anxiety disorder, unspecified: Secondary | ICD-10-CM | POA: Diagnosis not present

## 2018-10-06 DIAGNOSIS — Z87891 Personal history of nicotine dependence: Secondary | ICD-10-CM | POA: Insufficient documentation

## 2018-10-06 DIAGNOSIS — R1310 Dysphagia, unspecified: Secondary | ICD-10-CM

## 2018-10-06 DIAGNOSIS — H409 Unspecified glaucoma: Secondary | ICD-10-CM | POA: Insufficient documentation

## 2018-10-06 DIAGNOSIS — I10 Essential (primary) hypertension: Secondary | ICD-10-CM | POA: Diagnosis not present

## 2018-10-06 DIAGNOSIS — M1990 Unspecified osteoarthritis, unspecified site: Secondary | ICD-10-CM | POA: Diagnosis not present

## 2018-10-06 DIAGNOSIS — R1314 Dysphagia, pharyngoesophageal phase: Secondary | ICD-10-CM | POA: Insufficient documentation

## 2018-10-06 DIAGNOSIS — M797 Fibromyalgia: Secondary | ICD-10-CM | POA: Diagnosis not present

## 2018-10-06 DIAGNOSIS — E1142 Type 2 diabetes mellitus with diabetic polyneuropathy: Secondary | ICD-10-CM | POA: Insufficient documentation

## 2018-10-06 DIAGNOSIS — K317 Polyp of stomach and duodenum: Secondary | ICD-10-CM

## 2018-10-06 DIAGNOSIS — R1013 Epigastric pain: Secondary | ICD-10-CM | POA: Diagnosis present

## 2018-10-06 DIAGNOSIS — Z8673 Personal history of transient ischemic attack (TIA), and cerebral infarction without residual deficits: Secondary | ICD-10-CM | POA: Diagnosis not present

## 2018-10-06 DIAGNOSIS — G8929 Other chronic pain: Secondary | ICD-10-CM | POA: Insufficient documentation

## 2018-10-06 DIAGNOSIS — Z7984 Long term (current) use of oral hypoglycemic drugs: Secondary | ICD-10-CM | POA: Diagnosis not present

## 2018-10-06 HISTORY — PX: POLYPECTOMY: SHX5525

## 2018-10-06 HISTORY — PX: ESOPHAGOGASTRODUODENOSCOPY (EGD) WITH PROPOFOL: SHX5813

## 2018-10-06 LAB — GLUCOSE, CAPILLARY: Glucose-Capillary: 121 mg/dL — ABNORMAL HIGH (ref 70–99)

## 2018-10-06 SURGERY — ESOPHAGOGASTRODUODENOSCOPY (EGD) WITH PROPOFOL
Anesthesia: General

## 2018-10-06 MED ORDER — PROPOFOL 10 MG/ML IV BOLUS
INTRAVENOUS | Status: AC
  Filled 2018-10-06: qty 20

## 2018-10-06 MED ORDER — LACTATED RINGERS IV SOLN
INTRAVENOUS | Status: DC | PRN
  Administered 2018-10-06: 10:00:00 via INTRAVENOUS

## 2018-10-06 MED ORDER — KETAMINE HCL 10 MG/ML IJ SOLN
INTRAMUSCULAR | Status: DC | PRN
  Administered 2018-10-06: 10 mg via INTRAVENOUS

## 2018-10-06 MED ORDER — PROPOFOL 10 MG/ML IV BOLUS
INTRAVENOUS | Status: DC | PRN
  Administered 2018-10-06: 20 mg via INTRAVENOUS
  Administered 2018-10-06: 10 mg via INTRAVENOUS

## 2018-10-06 MED ORDER — PROPOFOL 500 MG/50ML IV EMUL
INTRAVENOUS | Status: DC | PRN
  Administered 2018-10-06: 150 ug/kg/min via INTRAVENOUS

## 2018-10-06 MED ORDER — KETAMINE HCL 50 MG/5ML IJ SOSY
PREFILLED_SYRINGE | INTRAMUSCULAR | Status: AC
  Filled 2018-10-06: qty 5

## 2018-10-06 MED ORDER — PROPOFOL 10 MG/ML IV BOLUS
INTRAVENOUS | Status: AC
  Filled 2018-10-06: qty 40

## 2018-10-06 NOTE — Discharge Instructions (Signed)
° °  Upper Endoscopy, Adult, Care After This sheet gives you information about how to care for yourself after your procedure. Your health care provider may also give you more specific instructions. If you have problems or questions, contact your health care provider. What can I expect after the procedure? After the procedure, it is common to have:  A sore throat.  Mild stomach pain or discomfort.  Bloating.  Nausea. Follow these instructions at home:   Follow instructions from your health care provider about what to eat or drink after your procedure.  Return to your normal activities as told by your health care provider. Ask your health care provider what activities are safe for you.  Take over-the-counter and prescription medicines only as told by your health care provider.  Do not drive for 24 hours if you were given a sedative during your procedure.  Keep all follow-up visits as told by your health care provider. This is important. Contact a health care provider if you have:  A sore throat that lasts longer than one day.  Trouble swallowing. Get help right away if:  You vomit blood or your vomit looks like coffee grounds.  You have: ? A fever. ? Bloody, black, or tarry stools. ? A severe sore throat or you cannot swallow. ? Difficulty breathing. ? Severe pain in your chest or abdomen. Summary  After the procedure, it is common to have a sore throat, mild stomach discomfort, bloating, and nausea.  Do not drive for 24 hours if you were given a sedative during the procedure.  Follow instructions from your health care provider about what to eat or drink after your procedure.  Return to your normal activities as told by your health care provider. This information is not intended to replace advice given to you by your health care provider. Make sure you discuss any questions you have with your health care provider. Document Released: 11/25/2011 Document Revised:  10/26/2017 Document Reviewed: 10/26/2017 Elsevier Interactive Patient Education  2019 [employer].       No aspirin or NSAIDs for 24 hours. Resume usual medications as before. Resume diet as before. No driving for 24 hours. Physician will call with biopsy results.

## 2018-10-06 NOTE — Transfer of Care (Signed)
Immediate Anesthesia Transfer of Care Note  Patient: [name]  Procedure(s) Performed: ESOPHAGOGASTRODUODENOSCOPY (EGD) WITH PROPOFOL (N/A ) POLYPECTOMY  Patient Location: PACU  Anesthesia Type:General  Level of Consciousness: awake and alert   Airway & Oxygen Therapy: Patient Spontanous Breathing  Post-op Assessment: Report given to RN  Post vital signs: Reviewed and stable  Last Vitals:  Vitals Value Taken Time  BP    Temp    Pulse    Resp    SpO2      Last Pain:  Vitals:   10/06/18 1058  TempSrc:   PainSc: 0-No pain      Patients Stated Pain Goal: 5 (20/25/42 7062)  Complications: No apparent anesthesia complications

## 2018-10-06 NOTE — Anesthesia Postprocedure Evaluation (Signed)
Anesthesia Post Note  Patient: [name]  Procedure(s) Performed: ESOPHAGOGASTRODUODENOSCOPY (EGD) WITH PROPOFOL (N/A ) POLYPECTOMY  Patient location during evaluation: PACU Anesthesia Type: General Level of consciousness: awake and alert and oriented Pain management: pain level controlled Vital Signs Assessment: post-procedure vital signs reviewed and stable Respiratory status: spontaneous breathing Cardiovascular status: blood pressure returned to baseline and stable Postop Assessment: no apparent nausea or vomiting Anesthetic complications: no     Last Vitals:  Vitals:   10/06/18 0855  BP: (!) 176/76  Pulse: 67  Resp: 18  SpO2: 98%    Last Pain:  Vitals:   10/06/18 1058  TempSrc:   PainSc: 0-No pain                 Tecia Cinnamon

## 2018-10-06 NOTE — Op Note (Signed)
[facility] Patient Name: [name] Procedure Date: 10/06/2018 10:22 AM MRN: [id number] Date of Birth: 1941/01/02 Attending MD: Hildred Laser , MD CSN: [id number] Age: 78 Admit Type: Outpatient Procedure:                Upper GI endoscopy Indications:              Esophageal dysphagia Providers:                Hildred Laser, MD, Charlsie Quest. Theda Sers RN, RN, Aram Candela Referring MD:             Newt Minion, MD Medicines:                Propofol per Anesthesia Complications:            No immediate complications. Estimated Blood Loss:     Estimated blood loss was minimal. Procedure:                Pre-Anesthesia Assessment:                           - Prior to the procedure, a History and Physical                            was performed, and patient medications and                            allergies were reviewed. The patient's tolerance of                            previous anesthesia was also reviewed. The risks                            and benefits of the procedure and the sedation                            options and risks were discussed with the patient.                            All questions were answered, and informed consent                            was obtained. Prior Anticoagulants: The patient has                            taken no previous anticoagulant or antiplatelet                            agents. ASA Grade Assessment: III - A patient with                            severe systemic disease. After reviewing the risks  and benefits, the patient was deemed in                            satisfactory condition to undergo the procedure.                           After obtaining informed consent, the endoscope was                            passed under direct vision. Throughout the                            procedure, the patient's blood pressure, pulse, and                            oxygen  saturations were monitored continuously. The                            [id number]-[id number] ([id number]) scope was introduced through the                            and advanced to the second part of duodenum. The                            upper GI endoscopy was accomplished without                            difficulty. The patient tolerated the procedure                            well. Scope In: 11:00:43 AM Scope Out: 11:10:56 AM Total Procedure Duration: 0 hours 10 minutes 13 seconds  Findings:      The lumen of the mid esophagus was mildly dilated.      The proximal esophagus and mid esophagus were normal.      LA Grade B (one or more mucosal breaks greater than 5 mm, not extending       between the tops of two mucosal folds) esophagitis with no bleeding was       found 31 to 33 cm from the incisors.      The Z-line was irregular and was found 33 cm from the incisors. It was       wide open.      A medium amount of food (residue) was found in the gastric body and in       the gastric antrum.      Two 5 to 6 mm sessile polyps with no stigmata of recent bleeding were       found in the gastric fundus. Biopsies were taken with a cold forceps for       histology.      The exam of the stomach was otherwise normal.      The duodenal bulb and second portion of the duodenum were normal. Impression:               - Dilation in the mid esophagus.                           -  Normal proximal esophagus and mid esophagus.                           - LA Grade B reflux esophagitis.                           - Z-line irregular, 33 cm from the incisors.                           - A medium amount of food (residue) in the stomach.                            Pylorus patent.                           - Two gastric polyps. Biopsied.                           - Normal duodenal bulb and second portion of the                            duodenum.                           Comment: GEJ was patent and not  dilated. Moderate Sedation:      Per Anesthesia Care Recommendation:           - Patient has a contact number available for                            emergencies. The signs and symptoms of potential                            delayed complications were discussed with the                            patient. Return to normal activities tomorrow.                            Written discharge instructions were provided to the                            patient.                           - Diabetic (ADA) diet and gastroparesis diet today.                           - Continue present medications.                           - No aspirin, ibuprofen, naproxen, or other                            non-steroidal anti-inflammatory drugs for 1 day.                           -  Await pathology results. Procedure Code(s):        --- Professional ---                           [phone], Esophagogastroduodenoscopy, flexible,                            transoral; with biopsy, single or multiple Diagnosis Code(s):        --- Professional ---                           K22.8, Other specified diseases of esophagus                           K21.0, Gastro-esophageal reflux disease with                            esophagitis                           K31.7, Polyp of stomach and duodenum                           R13.14, Dysphagia, pharyngoesophageal phase CPT copyright 2019 American Medical Association. All rights reserved. The codes documented in this report are preliminary and upon coder review may  be revised to meet current compliance requirements. Hildred Laser, MD Hildred Laser, MD 10/06/2018 11:22:07 AM This report has been signed electronically. Number of Addenda: 0

## 2018-10-06 NOTE — Anesthesia Preprocedure Evaluation (Signed)
Anesthesia Evaluation  Patient identified by MRN, date of birth, ID band Patient awake    Reviewed: Allergy & Precautions, NPO status , Patient's Chart, lab work & pertinent test results  History of Anesthesia Complications (+) PONV and Family history of anesthesia reaction  Airway Mallampati: II  TM Distance: >3 FB Neck ROM: Full    Dental no notable dental hx.    Pulmonary pneumonia, resolved, former smoker,  States occ inhaler use for S. Allergies  States last ~ 7 days prior    Pulmonary exam normal breath sounds clear to auscultation       Cardiovascular Exercise Tolerance: Poor hypertension, Pt. on medications + angina with exertion + CAD  Normal cardiovascular examII Rhythm:Regular Rate:Normal     Neuro/Psych  Headaches, Anxiety Dementia TIA Neuromuscular disease negative psych ROS   GI/Hepatic Neg liver ROS, hiatal hernia, PUD, GERD  Medicated and Controlled,  Endo/Other  negative endocrine ROSdiabetes  Renal/GU Renal InsufficiencyRenal disease  negative genitourinary   Musculoskeletal  (+) Arthritis , Osteoarthritis,  Fibromyalgia -  Abdominal   Peds negative pediatric ROS (+)  Hematology negative hematology ROS (+) anemia ,   Anesthesia Other Findings   Reproductive/Obstetrics negative OB ROS                             Anesthesia Physical Anesthesia Plan  ASA: III  Anesthesia Plan: General   Post-op Pain Management:    Induction: Intravenous  PONV Risk Score and Plan:   Airway Management Planned: Nasal Cannula and Simple Face Mask  Additional Equipment:   Intra-op Plan:   Post-operative Plan:   Informed Consent: I have reviewed the patients History and Physical, chart, labs and discussed the procedure including the risks, benefits and alternatives for the proposed anesthesia with the patient or authorized representative who has indicated his/her understanding  and acceptance.     Dental advisory given  Plan Discussed with: CRNA  Anesthesia Plan Comments: (Full PPE use planned  GA planned -GETA as needed d/w pt -WTP after Q&A Case deemed appropriate by Dr. Laural Golden in light of current COVID concerns )        Anesthesia Quick Evaluation

## 2018-10-06 NOTE — H&P (Signed)
[name] is an 78 y.o. female.   Chief Complaint: Patient is here for EGD and ED. HPI: Patient is 78 year old Caucasian female with multiple medical problems who has been experiencing progressive dysphagia to solids as well as liquids.  She has a history of remote antireflux surgery and few years ago she had myotomy for achalasia.  She also has a history of gastroparesis and has undergone Botox therapy to pylorus.  She was seen in the office last week.  She also complains of epigastric pain. She is having dysphagia symptoms every day she is undergoing repeat EGD.  Past Medical History:  Diagnosis Date  . Anemia   . Anxiety   . Arthritis    "all over" (04/19/2013)  . Bleeding stomach ulcer 1980's  . Bloating   . Chronic back pain   . Chronic headaches   . Chronic heartburn   . Chronic neck pain   . Chronic pain   . Colon cancer ([place])   . DDD (degenerative disc disease), lumbar   . Diabetic peripheral neuropathy ([place])    "in my feet" (04/19/2013)  . Dizziness   . Dysphagia   . Family history of anesthesia complication    "daughter has PONV too" (04/19/2013)  . Fibromyalgia   . Gastroesophageal reflux   . Gastroparesis   . Glaucoma, bilateral   . Hiatal hernia    "had it before; had OR; got it again" (04/19/2013)  . History of blood transfusion 1980's   "w/bleeding stomach ulcer" (04/19/2013)  . Hypertension    "for diabetes; don't have high blood pressure" (04/19/2013)  . Migraine    "take RX for it qd" (04/19/2013)  . Nausea   . Pneumonia 04/2012  . PONV (postoperative nausea and vomiting)   . Renal insufficiency   . TIA (transient ischemic attack)    "3-4 before starting RX; none since" (04/19/2013)  . Type II diabetes mellitus ([place])   . Walking pneumonia ~ 1966    Past Surgical History:  Procedure Laterality Date  . ABDOMINAL ADHESION SURGERY  ~ 2012   "repaired wrap where they did hiatal hernia OR too" 911/04/2013)  . ANTERIOR CERVICAL DISCECTOMY  ~ 2009    "only cleaned out arthritis and spurs" (04/19/2013)  . CATARACT EXTRACTION W/ INTRAOCULAR LENS IMPLANT Left 04/13/2013  . CHOLECYSTECTOMY  1980's  . COLON SURGERY    . COLONOSCOPY  10/11/2010  . COLONOSCOPY  11/23/2009  . COLONOSCOPY  07/28/2008   W/SNARE  . COLONOSCOPY  06/28/07  . COLONOSCOPY  05/10/07   W/POLYP  . COLONOSCOPY  12/28/00  . COLONOSCOPY N/A 06/29/2015   Procedure: COLONOSCOPY;  Surgeon: Rogene Houston, MD;  Location: [facility];  Service: Endoscopy;  Laterality: N/A;  135  . COLONOSCOPY WITH ESOPHAGOGASTRODUODENOSCOPY (EGD) N/A 05/13/2013   Procedure: COLONOSCOPY WITH ESOPHAGOGASTRODUODENOSCOPY (EGD);  Surgeon: Rogene Houston, MD;  Location: [facility];  Service: Endoscopy;  Laterality: N/A;  855  . HEMICOLECTOMY  2010   ZIEGLER  . HIATAL HERNIA REPAIR  1970's  . LEFT HEART CATHETERIZATION WITH CORONARY ANGIOGRAM N/A 04/19/2013   Procedure: LEFT HEART CATHETERIZATION WITH CORONARY ANGIOGRAM;  Surgeon: Peter M Martinique, MD;  Location: [facility];  Service: Cardiovascular;  Laterality: N/A;  . [place]   "had a broken neck" (04/19/2013)  . TONSILLECTOMY  ~ 1953  . UPPER GASTROINTESTINAL ENDOSCOPY  10/11/2010   EGD ED  . UPPER GASTROINTESTINAL ENDOSCOPY  11/23/2009  . UPPER GASTROINTESTINAL ENDOSCOPY  05/10/07   EGD ED  . UPPER GASTROINTESTINAL ENDOSCOPY  08/11/01   EGD ED  . VAGINAL HYSTERECTOMY      Family History  Problem Relation Age of Onset  . Heart disease Mother   . Diabetes Mother   . Dementia Father   . Healthy Sister   . Diabetes Brother   . Kidney cancer Brother   . Diabetes Brother   . Neuropathy Brother   . Diabetes Daughter   . Diabetes Daughter   . Diabetes Son    Social History:  reports that she quit smoking about 27 years ago. Her smoking use included cigarettes. She has a 22.50 pack-year smoking history. She has never used smokeless tobacco. She reports that she does not drink alcohol or use  drugs.  Allergies:  Allergies  Allergen Reactions  . Botox [Onabotulinumtoxina]     Patient states that it became loose in her stomach , and it caused multiple problems.  . Morphine Anaphylaxis and Other (See Comments)    "it will kill me" "made me stop breathing"  . Aspirin Other (See Comments)    Gi bleed   . Cefuroxime Axetil Nausea And Vomiting  . Codeine Nausea And Vomiting  . Diltiazem Nausea And Vomiting  . Dronabinol Nausea And Vomiting and Palpitations    Patient also states that she had palpatations  . Shellfish Allergy Other (See Comments)    Blood sugar drops  . Ondansetron Hives  . Penicillins Rash    Did it involve swelling of the face/tongue/throat, SOB, or low BP? No Did it involve sudden or severe rash/hives, skin peeling, or any reaction on the inside of your mouth or nose? Yes Did you need to seek medical attention at a hospital or doctor's office? Yes When did it last happen?Over 10 years If all above answers are "NO", may proceed with cephalosporin use.     Medications Prior to Admission  Medication Sig Dispense Refill  . Calcium Carbonate-Vitamin D (CALCIUM + D PO) Take 1 tablet by mouth daily. TAKING 1,000 CALCIUM AND 2,000 VIT D    . cholestyramine (QUESTRAN) 4 g packet Take 4 g by mouth at bedtime.     . Cyanocobalamin (VITAMIN B 12 PO) Take 2,000 mcg by mouth daily.    Marland Kitchen dexlansoprazole (DEXILANT) 60 MG capsule Take 1 capsule (60 mg total) by mouth daily before breakfast. 90 capsule 3  . DULoxetine (CYMBALTA) 60 MG capsule Take 60 mg by mouth every morning.     . fenofibrate micronized (LOFIBRA) 134 MG capsule Take 134 mg by mouth daily before breakfast.    . metFORMIN (GLUCOPHAGE) 500 MG tablet Take 500 mg by mouth 2 (two) times daily with a meal.    . metoCLOPramide (REGLAN) 10 MG tablet Take 1 tablet (10 mg total) by mouth 3 (three) times daily before meals. (Patient taking differently: Take 10 mg by mouth 3 (three) times daily as needed. ) 270  tablet 1  . mometasone (NASONEX) 50 MCG/ACT nasal spray Place 2 sprays into the nose daily as needed (Allergies).     . Multiple Vitamins-Minerals (MULTIVITAMINS THER. W/MINERALS) TABS tablet Take 1 tablet by mouth daily.    . ranitidine (ZANTAC) 300 MG tablet Take 1 tablet (300 mg total) by mouth at bedtime. 90 tablet 3  . ranolazine (RANEXA) 1000 MG SR tablet Take 1 tablet (1,000 mg total) by mouth 2 (two) times daily. 180 tablet 3  . silodosin (RAPAFLO) 8 MG CAPS capsule Take 8 mg by  mouth at bedtime.     . topiramate (TOPAMAX) 100 MG tablet Take 1 tablet (100 mg total) by mouth 2 (two) times daily. (Patient taking differently: Take 100 mg by mouth at bedtime. ) 60 tablet 5  . Travoprost, BAK Free, (TRAVATAN Z) 0.004 % SOLN ophthalmic solution Place 1 drop into both eyes at bedtime.     Marland Kitchen acetaminophen (TYLENOL) 500 MG tablet Take 500 mg by mouth every 6 (six) hours as needed for mild pain.     Marland Kitchen ALPRAZolam (XANAX XR) 1 MG 24 hr tablet Take 0.5-1 mg by mouth as needed for sleep (Anxiety).     . cyclobenzaprine (FLEXERIL) 10 MG tablet Take 10 mg by mouth at bedtime. *May take up to three times daily as needed for muscle spasms    . oxyCODONE-acetaminophen (PERCOCET) 7.5-325 MG tablet Take 1 tablet by mouth every 4 (four) hours as needed for severe pain.    . polyethylene glycol (MIRALAX / GLYCOLAX) packet Take 17 g by mouth daily as needed for mild constipation.       Results for orders placed or performed during the hospital encounter of 10/06/18 (from the past 48 hour(s))  Glucose, capillary     Status: Abnormal   Collection Time: 10/06/18  9:11 AM  Result Value Ref Range   Glucose-Capillary 121 (H) 70 - 99 mg/dL   No results found.  ROS  Blood pressure (!) 176/76, pulse 67, resp. rate 18, SpO2 98 %. Physical Exam  Constitutional: She appears well-developed and well-nourished.  HENT:  Mouth/Throat: Oropharynx is clear and moist.  Eyes: Conjunctivae are normal. No scleral icterus.   Neck: No thyromegaly present.  Cardiovascular: Normal rate, regular rhythm and normal heart sounds.  No murmur heard. Respiratory: Effort normal and breath sounds normal.  GI:  Abdomen is symmetrical with right subcostal and upper midline scars.  She has mild midepigastric tenderness.  Abdomen however is soft.  No organomegaly or masses.  Musculoskeletal:        General: No edema.  Lymphadenopathy:    She has no cervical adenopathy.  Neurological: She is alert.  Skin: Skin is warm and dry.     Assessment/Plan Esophageal dysphagia. History of achalasia as above. EGD with ED.  Hildred Laser, MD 10/06/2018, 10:50 AM

## 2018-10-08 ENCOUNTER — Encounter (HOSPITAL_COMMUNITY): Payer: Self-pay | Admitting: Internal Medicine

## 2018-10-14 ENCOUNTER — Other Ambulatory Visit (INDEPENDENT_AMBULATORY_CARE_PROVIDER_SITE_OTHER): Payer: Self-pay | Admitting: Internal Medicine

## 2018-10-14 MED ORDER — FAMOTIDINE 40 MG PO TABS: 40.0000 mg | ORAL_TABLET | Freq: Every day | ORAL | 5 refills | Status: DC

## 2018-12-31 ENCOUNTER — Other Ambulatory Visit: Payer: Self-pay

## 2018-12-31 ENCOUNTER — Other Ambulatory Visit (HOSPITAL_COMMUNITY)
Admission: RE | Admit: 2018-12-31 | Discharge: 2018-12-31 | Disposition: A | Discharge status: Home / Self Care | Payer: Medicare Other | Source: Ambulatory Visit | Attending: Internal Medicine | Admitting: Internal Medicine

## 2018-12-31 DIAGNOSIS — Z1159 Encounter for screening for other viral diseases: Secondary | ICD-10-CM | POA: Diagnosis present

## 2019-01-01 LAB — SARS CORONAVIRUS 2 (TAT 6-24 HRS): SARS Coronavirus 2: NEGATIVE

## 2019-01-04 ENCOUNTER — Other Ambulatory Visit: Payer: Self-pay

## 2019-01-04 ENCOUNTER — Encounter (INDEPENDENT_AMBULATORY_CARE_PROVIDER_SITE_OTHER): Payer: Self-pay | Admitting: Internal Medicine

## 2019-01-04 ENCOUNTER — Ambulatory Visit (INDEPENDENT_AMBULATORY_CARE_PROVIDER_SITE_OTHER): Payer: Medicare Other | Admitting: Internal Medicine

## 2019-01-04 VITALS — BP 147/80 | HR 66 | Temp 98.4°F | Resp 18 | Ht 59.0 in | Wt 140.6 lb

## 2019-01-04 DIAGNOSIS — K3184 Gastroparesis: Secondary | ICD-10-CM | POA: Diagnosis not present

## 2019-01-04 DIAGNOSIS — K220 Achalasia of cardia: Secondary | ICD-10-CM | POA: Diagnosis not present

## 2019-01-04 DIAGNOSIS — E1143 Type 2 diabetes mellitus with diabetic autonomic (poly)neuropathy: Secondary | ICD-10-CM

## 2019-01-04 DIAGNOSIS — K219 Gastro-esophageal reflux disease without esophagitis: Secondary | ICD-10-CM

## 2019-01-04 NOTE — Progress Notes (Signed)
Presenting complaint;  Follow-up for GERD gastroparesis as well as achalasia.  Database and subjective:  Patient is 78 year old Caucasian female with chronic GERD, history of achalasia, diabetic gastroparesis as well as history of diarrhea who was last seen on 09/27/2018 and she was not doing well.  She was also complaining of falling episodes.  I recommended that she use metoclopramide only when absolutely necessary.  She underwent EGD on 10/06/2018 revealing patulous GE junction and moderate amount of food debris in the stomach.  Patient was therefore referred to Dr. Derrill Kay of [facility].  She has seen them but has not yet undergone any procedure because of ongoing COVID-19 pandemic. I will also recommended follow-up with the neurologist but she has not yet talked with Dr. Karie Kirks.  Patient is here for scheduled visit accompanied by her daughter Karna Christmas.  She has seen Dr. Derrill Kay but has not undergone endoscopic evaluation.  She is scheduled for the procedure on 01/06/2019 and again on 01/07/2019.  It is my understanding that she will have a Botox of pyloric channel and may have balloon dilation/Botox for achalasia as well. Patient remains with frequent heartburn.  She states dexlansoprazole dose was doubled by Dr. Derrill Kay but new prescription has not arrived yet.  She remains with frequent postprandial regurgitation and she also spits of food frequently.  She has not had what she would consider to be vomiting.  She is presently still vomiting on liquids and soupy fluids.  She has lost 11 pounds in the last 13 weeks.  Her daughter states she choked while she was eating [place] fries today.  She remains with intermittent diarrhea.  She has anywhere from 1-4 stools per day.  She is not having accidents like she was having before.  She has not used metoclopramide since her last visit but she is still having falling episodes.  She has had 4 episodes in the last 3 months. She did have coronary testing last Friday and was  negative.  Report was sent to Dr. Derrill Kay.   Current Medications: Outpatient Encounter Medications as of 01/04/2019  Medication Sig  . acetaminophen (TYLENOL) 500 MG tablet Take 500 mg by mouth every 6 (six) hours as needed for mild pain.   Marland Kitchen ALPRAZolam (XANAX XR) 1 MG 24 hr tablet Take 0.5-1 mg by mouth as needed for sleep (Anxiety).   . Cyanocobalamin (VITAMIN B 12 PO) Take 2,000 mcg by mouth daily.  . cyclobenzaprine (FLEXERIL) 10 MG tablet Take 10 mg by mouth at bedtime. *May take up to three times daily as needed for muscle spasms  . dexlansoprazole (DEXILANT) 60 MG capsule Take 1 capsule (60 mg total) by mouth daily before breakfast.  . DULoxetine (CYMBALTA) 60 MG capsule Take 60 mg by mouth every morning.   . famotidine (PEPCID) 40 MG tablet Take 1 tablet (40 mg total) by mouth at bedtime.  . metFORMIN (GLUCOPHAGE) 500 MG tablet Take 500 mg by mouth 2 (two) times daily with a meal.  . metoCLOPramide (REGLAN) 10 MG tablet Take 1 tablet (10 mg total) by mouth 3 (three) times daily before meals. (Patient taking differently: Take 10 mg by mouth 3 (three) times daily as needed. )  . mometasone (NASONEX) 50 MCG/ACT nasal spray Place 2 sprays into the nose daily as needed (Allergies).   Marland Kitchen oxyCODONE-acetaminophen (PERCOCET) 7.5-325 MG tablet Take 1 tablet by mouth every 4 (four) hours as needed for severe pain.  . polyethylene glycol (MIRALAX / GLYCOLAX) packet Take 17 g by mouth daily as needed  for mild constipation.   . ranolazine (RANEXA) 1000 MG SR tablet Take 1 tablet (1,000 mg total) by mouth 2 (two) times daily.  Marland Kitchen topiramate (TOPAMAX) 100 MG tablet Take 1 tablet (100 mg total) by mouth 2 (two) times daily. (Patient taking differently: Take 100 mg by mouth at bedtime. )  . Travoprost, BAK Free, (TRAVATAN Z) 0.004 % SOLN ophthalmic solution Place 1 drop into both eyes at bedtime.   . cholestyramine (QUESTRAN) 4 g packet Take 4 g by mouth at bedtime.   . silodosin (RAPAFLO) 8 MG CAPS capsule  Take 8 mg by mouth at bedtime.   . [DISCONTINUED] Calcium Carbonate-Vitamin D (CALCIUM + D PO) Take 1 tablet by mouth daily. TAKING 1,000 CALCIUM AND 2,000 VIT D  . [DISCONTINUED] fenofibrate micronized (LOFIBRA) 134 MG capsule Take 134 mg by mouth daily before breakfast.  . [DISCONTINUED] Multiple Vitamins-Minerals (MULTIVITAMINS THER. W/MINERALS) TABS tablet Take 1 tablet by mouth daily.   No facility-administered encounter medications on file as of 01/04/2019.      Objective: Blood pressure (!) 147/80, pulse 66, temperature 98.4 F (36.9 C), temperature source Oral, resp. rate 18, height 4\' 11"  (1.499 m), weight 140 lb 9.6 oz (63.8 kg). Patient is alert and wearing a mask. She has hearing impairment. Conjunctiva is pink. Sclera is nonicteric Oropharyngeal mucosa is normal. No neck masses or thyromegaly noted. Cardiac exam with regular rhythm normal S1 and S2. No murmur or gallop noted. Lungs are clear to auscultation. Abdomen is symmetrical soft with mild midepigastric tenderness.  No organomegaly or masses. No LE edema or clubbing noted.   Assessment:  #1.  Diabetic gastroparesis.  I feel most of her symptoms are secondary to gastroparesis.  We have backed off metoclopramide because of falling episodes.  It was really not helping a great deal to begin with.  She seems to have responded to Botox injection to pylorus although she did experience pain when she underwent last session couple of years ago.  Given progression and severity of her symptoms this therapy would be appropriate as recommended by Dr. Derrill Kay.  #2.  History of achalasia.  She is having intermittent dysphagia.  She is to undergo reevaluation at [facility]  #3.  Falling episodes.  These episodes could be related to topiramate.  No benefit noted on stopping metoclopramide. Once again I have asked patient's daughter to talk with Dr. Karie Kirks about referral to a neurologist.  #4. weight loss.  Weight loss appears to be due to  ongoing symptoms and she is not getting enough calories as she is on a limited diet.  Hopefully this will improve with endoscopic intervention that is planned for later this week at [facility].  Plan:  Patient encouraged to increase calorie intake.  She should be able to accomplish this with full liquids. We will plan to see her back in 3 months or earlier if necessary.

## 2019-01-04 NOTE — Patient Instructions (Addendum)
Weight check in 1 month Need to increase calorie intake as tolerated.

## 2019-01-12 ENCOUNTER — Ambulatory Visit (INDEPENDENT_AMBULATORY_CARE_PROVIDER_SITE_OTHER): Payer: Medicare Other | Admitting: Cardiovascular Disease

## 2019-01-12 ENCOUNTER — Encounter: Payer: Self-pay | Admitting: Cardiovascular Disease

## 2019-01-12 ENCOUNTER — Other Ambulatory Visit: Payer: Self-pay

## 2019-01-12 VITALS — BP 138/66 | HR 75 | Temp 97.1°F | Ht 59.0 in | Wt 140.0 lb

## 2019-01-12 DIAGNOSIS — R001 Bradycardia, unspecified: Secondary | ICD-10-CM | POA: Diagnosis not present

## 2019-01-12 DIAGNOSIS — I2510 Atherosclerotic heart disease of native coronary artery without angina pectoris: Secondary | ICD-10-CM | POA: Diagnosis not present

## 2019-01-12 DIAGNOSIS — I10 Essential (primary) hypertension: Secondary | ICD-10-CM

## 2019-01-12 DIAGNOSIS — R079 Chest pain, unspecified: Secondary | ICD-10-CM | POA: Diagnosis not present

## 2019-01-12 NOTE — Progress Notes (Signed)
SUBJECTIVE: The patient presents for routine follow-up for nonobstructive coronary artery disease, chest pain syndrome, and a history of asymptomatic sinus bradycardia.  Coronary angiography 04/19/13 showed 30% mid vessel and 40% distal LAD stenosis, 30-40% distal OM1 lesion, 20% proximal, 30% at the crux, and 40-50% distal RCA stenosis.  She also has problems with achalasia and diabetic gastroparesis and follows with GI, both Dr. Laural Golden and Dr. Derrill Kay at [facility] .  She is here with her daughter, Karna Christmas, who is also her POA.  The patient denies exertional chest pain and dyspnea.  She has some chronic left leg swelling and does have a compression stocking for this.  She knows to keep her leg elevated while at home.  She denies palpitations.  She has been out of Ranexa for the past 3 days and has not noticed a difference.   Review of Systems: As per "subjective", otherwise negative.  Allergies  Allergen Reactions  . Botox [Onabotulinumtoxina]     Patient states that it became loose in her stomach , and it caused multiple problems.  . Morphine Anaphylaxis and Other (See Comments)    "it will kill me" "made me stop breathing"  . Aspirin Other (See Comments)    Gi bleed   . Cefuroxime Axetil Nausea And Vomiting  . Codeine Nausea And Vomiting  . Diltiazem Nausea And Vomiting  . Dronabinol Nausea And Vomiting and Palpitations    Patient also states that she had palpatations  . Shellfish Allergy Other (See Comments)    Blood sugar drops  . Ondansetron Hives  . Penicillins Rash    Did it involve swelling of the face/tongue/throat, SOB, or low BP? No Did it involve sudden or severe rash/hives, skin peeling, or any reaction on the inside of your mouth or nose? Yes Did you need to seek medical attention at a hospital or doctor's office? Yes When did it last happen?Over 10 years If all above answers are "NO", may proceed with cephalosporin use.     Current Outpatient Medications   Medication Sig Dispense Refill  . acetaminophen (TYLENOL) 500 MG tablet Take 500 mg by mouth every 6 (six) hours as needed for mild pain.     Marland Kitchen ALPRAZolam (XANAX XR) 1 MG 24 hr tablet Take 0.5-1 mg by mouth as needed for sleep (Anxiety).     . cholestyramine (QUESTRAN) 4 g packet Take 4 g by mouth at bedtime.     . Cyanocobalamin (VITAMIN B 12 PO) Take 2,000 mcg by mouth daily.    . cyclobenzaprine (FLEXERIL) 10 MG tablet Take 10 mg by mouth at bedtime. *May take up to three times daily as needed for muscle spasms    . dexlansoprazole (DEXILANT) 60 MG capsule Take 1 capsule (60 mg total) by mouth daily before breakfast. 90 capsule 3  . dexlansoprazole (DEXILANT) 60 MG capsule Take by mouth.    . DULoxetine (CYMBALTA) 60 MG capsule Take 60 mg by mouth every morning.     . famotidine (PEPCID) 40 MG tablet Take 1 tablet (40 mg total) by mouth at bedtime. 30 tablet 5  . metFORMIN (GLUCOPHAGE) 500 MG tablet Take 500 mg by mouth 2 (two) times daily with a meal.    . metoCLOPramide (REGLAN) 10 MG tablet Take 1 tablet (10 mg total) by mouth 3 (three) times daily before meals. (Patient taking differently: Take 10 mg by mouth 3 (three) times daily as needed. ) 270 tablet 1  . metoCLOPramide (REGLAN) 10 MG tablet  Take by mouth.    . mometasone (NASONEX) 50 MCG/ACT nasal spray Place 2 sprays into the nose daily as needed (Allergies).     Marland Kitchen oxyCODONE-acetaminophen (PERCOCET) 7.5-325 MG tablet Take 1 tablet by mouth every 4 (four) hours as needed for severe pain.    . polyethylene glycol (MIRALAX / GLYCOLAX) packet Take 17 g by mouth daily as needed for mild constipation.     . ranolazine (RANEXA) 1000 MG SR tablet Take 1 tablet (1,000 mg total) by mouth 2 (two) times daily. 180 tablet 3  . silodosin (RAPAFLO) 8 MG CAPS capsule Take 8 mg by mouth at bedtime.     . topiramate (TOPAMAX) 100 MG tablet Take 1 tablet (100 mg total) by mouth 2 (two) times daily. (Patient taking differently: Take 100 mg by mouth at  bedtime. ) 60 tablet 5  . Travoprost, BAK Free, (TRAVATAN Z) 0.004 % SOLN ophthalmic solution Place 1 drop into both eyes at bedtime.      No current facility-administered medications for this visit.     Past Medical History:  Diagnosis Date  . Anemia   . Anxiety   . Arthritis    "all over" (04/19/2013)  . Bleeding stomach ulcer 1980's  . Bloating   . Chronic back pain   . Chronic headaches   . Chronic heartburn   . Chronic neck pain   . Chronic pain   . Colon cancer ([place])   . DDD (degenerative disc disease), lumbar   . Diabetic peripheral neuropathy ([place])    "in my feet" (04/19/2013)  . Dizziness   . Dysphagia   . Family history of anesthesia complication    "daughter has PONV too" (04/19/2013)  . Fibromyalgia   . Gastroesophageal reflux   . Gastroparesis   . Glaucoma, bilateral   . Hiatal hernia    "had it before; had OR; got it again" (04/19/2013)  . History of blood transfusion 1980's   "w/bleeding stomach ulcer" (04/19/2013)  . Hypertension    "for diabetes; don't have high blood pressure" (04/19/2013)  . Migraine    "take RX for it qd" (04/19/2013)  . Nausea   . Pneumonia 04/2012  . PONV (postoperative nausea and vomiting)   . Renal insufficiency   . TIA (transient ischemic attack)    "3-4 before starting RX; none since" (04/19/2013)  . Type II diabetes mellitus ([place])   . Walking pneumonia ~ 1966    Past Surgical History:  Procedure Laterality Date  . ABDOMINAL ADHESION SURGERY  ~ 2012   "repaired wrap where they did hiatal hernia OR too" 911/04/2013)  . ANTERIOR CERVICAL DISCECTOMY  ~ 2009   "only cleaned out arthritis and spurs" (04/19/2013)  . CATARACT EXTRACTION W/ INTRAOCULAR LENS IMPLANT Left 04/13/2013  . CHOLECYSTECTOMY  1980's  . COLON SURGERY    . COLONOSCOPY  10/11/2010  . COLONOSCOPY  11/23/2009  . COLONOSCOPY  07/28/2008   W/SNARE  . COLONOSCOPY  06/28/07  . COLONOSCOPY  05/10/07   W/POLYP  . COLONOSCOPY  12/28/00  . COLONOSCOPY N/A  06/29/2015   Procedure: COLONOSCOPY;  Surgeon: Rogene Houston, MD;  [facility];  Service: Endoscopy;  Laterality: N/A;  135  . COLONOSCOPY WITH ESOPHAGOGASTRODUODENOSCOPY (EGD) N/A 05/13/2013   Procedure: COLONOSCOPY WITH ESOPHAGOGASTRODUODENOSCOPY (EGD);  Surgeon: Rogene Houston, MD;  [facility];  Service: Endoscopy;  Laterality: N/A;  855  . ESOPHAGOGASTRODUODENOSCOPY (EGD) WITH PROPOFOL N/A 10/06/2018   Procedure: ESOPHAGOGASTRODUODENOSCOPY (EGD) WITH PROPOFOL;  Surgeon:  Rogene Houston, MD;  [facility];  Service: Endoscopy;  Laterality: N/A;  . HEMICOLECTOMY  2010   ZIEGLER  . HIATAL HERNIA REPAIR  1970's  . LEFT HEART CATHETERIZATION WITH CORONARY ANGIOGRAM N/A 04/19/2013   Procedure: LEFT HEART CATHETERIZATION WITH CORONARY ANGIOGRAM;  Surgeon: Peter M Martinique, MD;  Location: [facility] CATH LAB;  Service: Cardiovascular;  Laterality: N/A;  . POLYPECTOMY  10/06/2018   Procedure: POLYPECTOMY;  Surgeon: Rogene Houston, MD;  [facility];  Service: Endoscopy;;  gastric  . [place]   "had a broken neck" (04/19/2013)  . TONSILLECTOMY  ~ 1953  . UPPER GASTROINTESTINAL ENDOSCOPY  10/11/2010   EGD ED  . UPPER GASTROINTESTINAL ENDOSCOPY  11/23/2009  . UPPER GASTROINTESTINAL ENDOSCOPY  05/10/07   EGD ED  . UPPER GASTROINTESTINAL ENDOSCOPY  08/11/01   EGD ED  . VAGINAL HYSTERECTOMY      Social History   Socioeconomic History  . Marital status: Widowed    Spouse name: Not on file  . Number of children: Not on file  . Years of education: Not on file  . Highest education level: Not on file  Occupational History    Employer: RETIRED  Social Needs  . Financial resource strain: Not on file  . Food insecurity    Worry: Not on file    Inability: Not on file  . Transportation needs    Medical: Not on file    Non-medical: Not on file  Tobacco Use  . Smoking status: Former Smoker    Packs/day: 1.50    Years: 15.00     Pack years: 22.50    Types: Cigarettes    Quit date: 03/17/1991    Years since quitting: 27.8  . Smokeless tobacco: Never Used  . Tobacco comment: Patient states that it has ben greater than 20 years since she quit smoking  Substance and Sexual Activity  . Alcohol use: No    Alcohol/week: 0.0 standard drinks  . Drug use: No  . Sexual activity: Never  Lifestyle  . Physical activity    Days per week: Not on file    Minutes per session: Not on file  . Stress: Not on file  Relationships  . Social Herbalist on phone: Not on file    Gets together: Not on file    Attends religious service: Not on file    Active member of club or organization: Not on file    Attends meetings of clubs or organizations: Not on file    Relationship status: Not on file  . Intimate partner violence    Fear of current or ex partner: Not on file    Emotionally abused: Not on file    Physically abused: Not on file    Forced sexual activity: Not on file  Other Topics Concern  . Not on file  Social History Narrative   Patient lives at home alone.    Patient is retired.    Patient is widowed.    Patient has 4 children.   Patient has a 11th grade education.            Vitals:   01/12/19 1302  BP: 138/66  Temp: (!) 97.1 F (36.2 C)  SpO2: 96%  Weight: 140 lb (63.5 kg)  Height: 4\' 11"  (1.499 m)  HR: 75 bpm  Wt Readings from Last 3 Encounters:  01/12/19 140 lb (63.5 kg)  01/04/19 140 lb 9.6  oz (63.8 kg)  10/01/18 (P) 150 lb 15.9 oz (68.5 kg)     PHYSICAL EXAM General: NAD HEENT: Normal. Neck: No JVD, no thyromegaly. Lungs: Clear to auscultation bilaterally with normal respiratory effort. CV: Regular rate and rhythm, normal S1/S2, no S3/S4, no murmur.  Trace left leg edema.  Abdomen: Soft, no distention.  Neurologic: Alert and oriented.  Psych: Normal affect. Skin: Normal. Musculoskeletal: No gross deformities.    ECG: Reviewed above under Subjective   Labs: Lab  Results  Component Value Date/Time   K 3.4 (L) 10/01/2018 10:21 AM   BUN 9 10/01/2018 10:21 AM   CREATININE 0.93 10/01/2018 10:21 AM   CREATININE 0.91 12/25/2015 10:04 AM   ALT 13 (L) 07/04/2016 03:35 AM   TSH 1.31 12/25/2015 10:04 AM   HGB 11.1 (L) 10/01/2018 10:21 AM     Lipids: No results found for: LDLCALC, LDLDIRECT, CHOL, TRIG, HDL     ASSESSMENT AND PLAN: 1. Chest pain syndrome:Symptomatically stable. She has nonobstructive coronary artery disease and severe GERD with achalasia and diabetic gastroparesis.  She has been out of Ranexa for the past 3 days and has not had an exacerbation of symptoms.  I will discontinue this for the time being.  2. Bradycardia:She is asymptomatic. Heart rate is 75 bpm today. Prior Holter monitoring showed normal sinus rhythm with very few PACs and sinus bradycardia with an average heart rate of 50 beats per minute. There were no long pauses detected. Given her known h/o severe gastroparesis, she may very well have an autonomic neuropathy.   3. XOV:ANVBTYOMAYOKHT coronary artery disease. She has been out of Ranexa for the past 3 days and has not had an exacerbation of symptoms.  I will discontinue this for the time being.  4. Hypertension: Reasonably controlled.  No changes.    Disposition: Follow up 1 year   Kate Sable, M.D., F.A.C.C.

## 2019-01-12 NOTE — Patient Instructions (Signed)
Medication Instructions:  Your physician recommends that you continue on your current medications as directed. Please refer to the Current Medication list given to you today.  Stop Ranexa  If you need a refill on your cardiac medications before your next appointment, please call your pharmacy.   Lab work: NONE  If you have labs (blood work) drawn today and your tests are completely normal, you will receive your results only by: Marland Kitchen MyChart Message (if you have MyChart) OR . A paper copy in the mail If you have any lab test that is abnormal or we need to change your treatment, we will call you to review the results.  Testing/Procedures: NONE   Follow-Up: At [facility], you and your health needs are our priority.  As part of our continuing mission to provide you with exceptional heart care, we have created designated Provider Care Teams.  These Care Teams include your primary Cardiologist (physician) and Advanced Practice Providers (APPs -  Physician Assistants and Nurse Practitioners) who all work together to provide you with the care you need, when you need it. You will need a follow up appointment in 1 years.  Please call our office 2 months in advance to schedule this appointment.  You may see No primary care provider on file. or one of the following Advanced Practice Providers on your designated Care Team:   [place], PA-C [facility]) . Ermalinda Barrios, PA-C ([place])  Any Other Special Instructions Will Be Listed Below (If Applicable). Thank you for choosing [facility]!

## 2019-02-02 ENCOUNTER — Telehealth (INDEPENDENT_AMBULATORY_CARE_PROVIDER_SITE_OTHER): Payer: Self-pay | Admitting: *Deleted

## 2019-02-02 NOTE — Telephone Encounter (Signed)
Patient presented to the office for a weight check. She weighed in @ 141.6 lbs , at her OV on 01/12/2019 she weighed 140 lbs.  Patient states that since she went to [facility] and saw the doctor, got the Botox in her stomach, she is sicker. Every time she eats she is real nauseated. Says that she feels just sicker.  Patient advised that this would be addressed with Dr.Rehman and we would be in contact with her.

## 2019-02-02 NOTE — Telephone Encounter (Signed)
Per Dr.Rehman - patient's daughter should call [facility] and let Dr.Koch know what she is experiencing. I called and left a message on [name]'s voicemail.

## 2019-03-28 ENCOUNTER — Ambulatory Visit (INDEPENDENT_AMBULATORY_CARE_PROVIDER_SITE_OTHER): Payer: Medicare Other | Admitting: Otolaryngology

## 2019-03-28 ENCOUNTER — Other Ambulatory Visit: Payer: Self-pay

## 2019-03-28 DIAGNOSIS — J342 Deviated nasal septum: Secondary | ICD-10-CM | POA: Diagnosis not present

## 2019-03-28 DIAGNOSIS — H903 Sensorineural hearing loss, bilateral: Secondary | ICD-10-CM

## 2019-03-28 DIAGNOSIS — H6123 Impacted cerumen, bilateral: Secondary | ICD-10-CM | POA: Diagnosis not present

## 2019-03-28 DIAGNOSIS — J343 Hypertrophy of nasal turbinates: Secondary | ICD-10-CM | POA: Diagnosis not present

## 2019-03-28 DIAGNOSIS — J310 Chronic rhinitis: Secondary | ICD-10-CM | POA: Diagnosis not present

## 2019-04-12 ENCOUNTER — Encounter (INDEPENDENT_AMBULATORY_CARE_PROVIDER_SITE_OTHER): Payer: Self-pay

## 2019-04-12 ENCOUNTER — Ambulatory Visit (INDEPENDENT_AMBULATORY_CARE_PROVIDER_SITE_OTHER): Payer: Medicare Other | Admitting: Internal Medicine

## 2019-05-19 ENCOUNTER — Other Ambulatory Visit (INDEPENDENT_AMBULATORY_CARE_PROVIDER_SITE_OTHER): Payer: Self-pay | Admitting: Internal Medicine

## 2020-01-17 ENCOUNTER — Other Ambulatory Visit: Payer: Self-pay | Admitting: Oral Surgery

## 2020-01-17 DIAGNOSIS — M26622 Arthralgia of left temporomandibular joint: Secondary | ICD-10-CM

## 2020-02-07 ENCOUNTER — Other Ambulatory Visit: Payer: Self-pay

## 2020-02-07 ENCOUNTER — Ambulatory Visit (HOSPITAL_COMMUNITY)
Admission: RE | Admit: 2020-02-07 | Discharge: 2020-02-07 | Disposition: A | Discharge status: Home / Self Care | Payer: Medicare Other | Source: Ambulatory Visit | Attending: Oral Surgery | Admitting: Oral Surgery

## 2020-02-07 DIAGNOSIS — M26622 Arthralgia of left temporomandibular joint: Secondary | ICD-10-CM | POA: Diagnosis not present

## 2020-04-02 ENCOUNTER — Ambulatory Visit (INDEPENDENT_AMBULATORY_CARE_PROVIDER_SITE_OTHER): Payer: Medicare Other | Admitting: Gastroenterology

## 2020-04-03 ENCOUNTER — Ambulatory Visit (INDEPENDENT_AMBULATORY_CARE_PROVIDER_SITE_OTHER): Payer: Medicare Other | Admitting: Internal Medicine

## 2020-04-16 ENCOUNTER — Ambulatory Visit (HOSPITAL_COMMUNITY)
Admission: RE | Admit: 2020-04-16 | Discharge: 2020-04-16 | Disposition: A | Discharge status: Home / Self Care | Payer: Medicare Other | Source: Ambulatory Visit | Attending: Family Medicine | Admitting: Family Medicine

## 2020-04-16 ENCOUNTER — Other Ambulatory Visit: Payer: Self-pay

## 2020-04-16 ENCOUNTER — Other Ambulatory Visit (HOSPITAL_COMMUNITY): Payer: Self-pay | Admitting: Family Medicine

## 2020-04-16 DIAGNOSIS — R059 Cough, unspecified: Secondary | ICD-10-CM

## 2020-04-16 DIAGNOSIS — Z9189 Other specified personal risk factors, not elsewhere classified: Secondary | ICD-10-CM | POA: Insufficient documentation

## 2020-04-19 ENCOUNTER — Ambulatory Visit (INDEPENDENT_AMBULATORY_CARE_PROVIDER_SITE_OTHER): Payer: Medicare Other | Admitting: Gastroenterology

## 2020-04-20 ENCOUNTER — Encounter (HOSPITAL_COMMUNITY): Admission: RE | Payer: Self-pay | Source: Home / Self Care

## 2020-04-20 ENCOUNTER — Ambulatory Visit (HOSPITAL_COMMUNITY): Admission: RE | Admit: 2020-04-20 | Payer: Medicare Other | Source: Home / Self Care | Admitting: Oral Surgery

## 2020-04-20 SURGERY — DENTAL RESTORATION/EXTRACTIONS
Anesthesia: Monitor Anesthesia Care

## 2020-05-14 ENCOUNTER — Ambulatory Visit (INDEPENDENT_AMBULATORY_CARE_PROVIDER_SITE_OTHER): Payer: Medicare Other | Admitting: Gastroenterology

## 2020-05-14 ENCOUNTER — Telehealth (INDEPENDENT_AMBULATORY_CARE_PROVIDER_SITE_OTHER): Payer: Self-pay

## 2020-05-14 NOTE — Telephone Encounter (Signed)
noted 

## 2020-06-05 ENCOUNTER — Encounter (INDEPENDENT_AMBULATORY_CARE_PROVIDER_SITE_OTHER): Payer: Self-pay | Admitting: *Deleted

## 2020-08-21 NOTE — Progress Notes (Unsigned)
Cardiology Office Note   Date:  08/22/2020   ID:  [name], DOB 1941-02-23, MRN [id number]  PCP:  Remi Haggard, FNP  Cardiologist:   Dorris Carnes, MD    Patient presents for f/u of CAD    History of Present Illness: [name] is a 80 y.o. female with a history of CAD   Cath in 2014:   LAD  30% mid; 40% distal; OM1 30 to 40%;  RCA 40 to 50% distal.   She also has a hx of achalasia and diabetic gastroparesis and DM.    She was followed by [facility] in the past   Last seen in cardiology in Aug 2020  The pt is here with her daughter   Hard of hearing     In NOv she was diagnosed with COVID   Daughter says she developed a pneumonia  Never hospitalized   Has nebulizer The pt does says she has some SOB / tightness  Worse after COVID    She also gets chest tightness  On and off   Nebulizer helps some     NOt associated with actiivy    She does have a signif GI history as noted   Having signif problems with food (used to get Botox; follows in [place])           No outpatient medications have been marked as taking for the 08/22/20 encounter (Office Visit) with Fay Records, MD.     Allergies:   Botox [onabotulinumtoxina], Morphine, Aspirin, Cefuroxime axetil, Codeine, Diltiazem, Dronabinol, Shellfish allergy, Ondansetron, and Penicillins   Past Medical History:  Diagnosis Date  . Anemia   . Anxiety   . Arthritis    "all over" (04/19/2013)  . Bleeding stomach ulcer 1980's  . Bloating   . Chronic back pain   . Chronic headaches   . Chronic heartburn   . Chronic neck pain   . Chronic pain   . Colon cancer ([place])   . DDD (degenerative disc disease), lumbar   . Diabetic peripheral neuropathy ([place])    "in my feet" (04/19/2013)  . Dizziness   . Dysphagia   . Family history of anesthesia complication    "daughter has PONV too" (04/19/2013)  . Fibromyalgia   . Gastroesophageal reflux   . Gastroparesis   . Glaucoma, bilateral   . Hiatal hernia    "had it before;  had OR; got it again" (04/19/2013)  . History of blood transfusion 1980's   "w/bleeding stomach ulcer" (04/19/2013)  . Hypertension    "for diabetes; don't have high blood pressure" (04/19/2013)  . Migraine    "take RX for it qd" (04/19/2013)  . Nausea   . Pneumonia 04/2012  . PONV (postoperative nausea and vomiting)   . Renal insufficiency   . TIA (transient ischemic attack)    "3-4 before starting RX; none since" (04/19/2013)  . Type II diabetes mellitus ([place])   . Walking pneumonia ~ 1966    Past Surgical History:  Procedure Laterality Date  . ABDOMINAL ADHESION SURGERY  ~ 2012   "repaired wrap where they did hiatal hernia OR too" 911/04/2013)  . ANTERIOR CERVICAL DISCECTOMY  ~ 2009   "only cleaned out arthritis and spurs" (04/19/2013)  . CATARACT EXTRACTION W/ INTRAOCULAR LENS IMPLANT Left 04/13/2013  . CHOLECYSTECTOMY  1980's  . COLON SURGERY    . COLONOSCOPY  10/11/2010  . COLONOSCOPY  11/23/2009  . COLONOSCOPY  07/28/2008   W/SNARE  .  COLONOSCOPY  06/28/07  . COLONOSCOPY  05/10/07   W/POLYP  . COLONOSCOPY  12/28/00  . COLONOSCOPY N/A 06/29/2015   Procedure: COLONOSCOPY;  Surgeon: Rogene Houston, MD;  [facility];  Service: Endoscopy;  Laterality: N/A;  135  . COLONOSCOPY WITH ESOPHAGOGASTRODUODENOSCOPY (EGD) N/A 05/13/2013   Procedure: COLONOSCOPY WITH ESOPHAGOGASTRODUODENOSCOPY (EGD);  Surgeon: Rogene Houston, MD;  [facility];  Service: Endoscopy;  Laterality: N/A;  855  . ESOPHAGOGASTRODUODENOSCOPY (EGD) WITH PROPOFOL N/A 10/06/2018   Procedure: ESOPHAGOGASTRODUODENOSCOPY (EGD) WITH PROPOFOL;  Surgeon: Rogene Houston, MD;  [facility];  Service: Endoscopy;  Laterality: N/A;  . HEMICOLECTOMY  2010   ZIEGLER  . HIATAL HERNIA REPAIR  1970's  . LEFT HEART CATHETERIZATION WITH CORONARY ANGIOGRAM N/A 04/19/2013   Procedure: LEFT HEART CATHETERIZATION WITH CORONARY ANGIOGRAM;  Surgeon: Peter M Martinique, MD;  Location: [facility] CATH LAB;   Service: Cardiovascular;  Laterality: N/A;  . POLYPECTOMY  10/06/2018   Procedure: POLYPECTOMY;  Surgeon: Rogene Houston, MD;  [facility];  Service: Endoscopy;;  gastric  . [place]   "had a broken neck" (04/19/2013)  . TONSILLECTOMY  ~ 1953  . UPPER GASTROINTESTINAL ENDOSCOPY  10/11/2010   EGD ED  . UPPER GASTROINTESTINAL ENDOSCOPY  11/23/2009  . UPPER GASTROINTESTINAL ENDOSCOPY  05/10/07   EGD ED  . UPPER GASTROINTESTINAL ENDOSCOPY  08/11/01   EGD ED  . VAGINAL HYSTERECTOMY       Social History:  The patient  reports that she quit smoking about 29 years ago. Her smoking use included cigarettes. She has a 22.50 pack-year smoking history. She has never used smokeless tobacco. She reports that she does not drink alcohol and does not use drugs.   Family History:  The patient's family history includes Dementia in her father; Diabetes in her brother, brother, daughter, daughter, mother, and son; Healthy in her sister; Heart disease in her mother; Kidney cancer in her brother; Neuropathy in her brother.    ROS:  Please see the history of present illness. All other systems are reviewed and  Negative to the above problem except as noted.    PHYSICAL EXAM: VS:  BP (!) 156/82   Pulse 82   Ht 4\' 11"  (1.499 m)   Wt 130 lb (59 kg)   SpO2 97%   BMI 26.26 kg/m   GEN: Thin 79 yo  in no acute distress  HEENT: normal  Neck: no JVD, carotid bruits, Cardiac: RRR; no murmurs,  Noo edema  Respiratory:  clear to auscultation bilaterally, normal work of breathing GI: soft, nontender, nondistended, + BS  No hepatomegaly  MS: no deformity Moving all extremities   Skin: warm and dry, no rash Neuro:  Strength and sensation are intact Psych: euthymic mood, full affect   EKG:  EKG is ordered today.  NSR 82 bpm   Poor R wave progression  Poss anterolateral MI (old)  Lipid Panel No results found for: CHOL, TRIG, HDL, CHOLHDL, VLDL, LDLCALC, LDLDIRECT     Wt Readings from Last 3 Encounters:  08/22/20 130 lb (59 kg)  01/12/19 140 lb (63.5 kg)  01/04/19 140 lb 9.6 oz (63.8 kg)      ASSESSMENT AND PLAN:  1  CAD  Pt with known mild CAD by cath in 2014  Now with some SOB (worse since fall) and also some Chest pains (hx of GI problems as well)  Confusing   I am not convinced  angina but need to evaluate WOuld recomm echo to eval LVEF / wall motion   If normal would recomm Lexiscan myovue to r/o ischemia     If LVEF down consider L heart cath Has not tolerated ASA due to GI upset  (severe)    2  HL  Will get lipids from Dr Bernette Redbird office  If not done in past year would repeat  3  HTN   BP is high today  She says she is in a lot of pain   Hx TMJ   Has a bad HA    I have asked her to follow with BP cuff/ keep log at home   Present when comes in again later this spring  Was on BP meds in the past   None now  4  CKD   Followed in renal clinic    Need to keep BP in check  5 GI  Pt followed in [place]   I encouraged her to continue to keep in contact given problems       Current medicines are reviewed at length with the patient today.  The patient does not have concerns regarding medicines.  Signed, Dorris Carnes, MD  08/22/2020 9:51 AM    [facility] [address], [place], [place]  [address] Phone: [phone]; Fax: [phone]

## 2020-08-22 ENCOUNTER — Encounter: Payer: Self-pay | Admitting: Internal Medicine

## 2020-08-22 ENCOUNTER — Ambulatory Visit (INDEPENDENT_AMBULATORY_CARE_PROVIDER_SITE_OTHER): Payer: Medicare Other | Admitting: Internal Medicine

## 2020-08-22 ENCOUNTER — Other Ambulatory Visit: Payer: Self-pay

## 2020-08-22 VITALS — BP 156/82 | HR 82 | Ht 59.0 in | Wt 130.0 lb

## 2020-08-22 DIAGNOSIS — I2510 Atherosclerotic heart disease of native coronary artery without angina pectoris: Secondary | ICD-10-CM

## 2020-08-22 DIAGNOSIS — R0602 Shortness of breath: Secondary | ICD-10-CM

## 2020-08-22 NOTE — Patient Instructions (Addendum)
Medication Instructions:  Your physician recommends that you continue on your current medications as directed. Please refer to the Current Medication list given to you today.    *If you need a refill on your cardiac medications before your next appointment, please call your pharmacy*   Lab Work: To be done the day of you Echo    If you have labs (blood work) drawn today and your tests are completely normal, you will receive your results only by: Marland Kitchen MyChart Message (if you have MyChart) OR . A paper copy in the mail If you have any lab test that is abnormal or we need to change your treatment, we will call you to review the results.   Testing/Procedures: Your physician has requested that you have an echocardiogram. Echocardiography is a painless test that uses sound waves to create images of your heart. It provides your doctor with information about the size and shape of your heart and how well your heart's chambers and valves are working. This procedure takes approximately one hour. There are no restrictions for this procedure.    Follow-Up: At [facility], you and your health needs are our priority.  As part of our continuing mission to provide you with exceptional heart care, we have created designated Provider Care Teams.  These Care Teams include your primary Cardiologist (physician) and Advanced Practice Providers (APPs -  Physician Assistants and Nurse Practitioners) who all work together to provide you with the care you need, when you need it.  We recommend signing up for the patient portal called "MyChart".  Sign up information is provided on this After Visit Summary.  MyChart is used to connect with patients for Virtual Visits (Telemedicine).  Patients are able to view lab/test results, encounter notes, upcoming appointments, etc.  Non-urgent messages can be sent to your provider as well.   To learn more about what you can do with MyChart, go to [email or web address].    Your  next appointment:   1 month(s)  The format for your next appointment:   In Person  Provider:   Dorris Carnes, MD   Other Instructions Thank you for choosing [facility]!

## 2020-09-10 ENCOUNTER — Ambulatory Visit (HOSPITAL_COMMUNITY): Admission: RE | Admit: 2020-09-10 | Payer: Medicare Other | Source: Ambulatory Visit

## 2020-09-25 ENCOUNTER — Telehealth: Payer: Self-pay | Admitting: Internal Medicine

## 2020-09-25 DIAGNOSIS — Z01818 Encounter for other preprocedural examination: Secondary | ICD-10-CM

## 2020-09-25 DIAGNOSIS — R079 Chest pain, unspecified: Secondary | ICD-10-CM

## 2020-09-25 NOTE — Telephone Encounter (Signed)
What dental office are you calling from? Dr Diona Browner DMD 1. What is your office phone number? [phone]  2. What is your fax number [phone]   3. What type of procedure is the patient having performed? Extraction 14 teeth with alveoloplasty , bilateral TMJ arthrocentesis   4. What date is procedure scheduled or is the patient there now? TBD  (if the patient is at the dentist's office question goes to their cardiologist if he/she is in the office.  If not, question should go to the DOD).   5. What is your question (ex. Antibiotics prior to procedure, holding medication-we need to know how long dentist wants pt to hold med)?  Provider wants ECHO results, clearance for procedure with proposed anesthesia , cardiac risk assessment

## 2020-09-26 NOTE — Telephone Encounter (Signed)
During the last visit with Dr. Harrington Challenger, she recommended a echocardiogram.  Given the extensive nature of the upcoming dental procedure, Dr. Harrington Challenger would you recommend complete echocardiogram before given final clearance?  Note, patient was a no-show for the last echocardiogram.  I attempted to call the patient to discuss her current symptoms, and she did not pick up the phone.  I left her message for her to call back and speak to the on-call preop APP oh today.

## 2020-09-26 NOTE — Telephone Encounter (Signed)
I would recomm starting with a complete TTE   PT with SOB

## 2020-09-27 NOTE — Telephone Encounter (Signed)
Message sent to [facility] scheduling to reach out to the pt and reschedule echo for pre op per Dr. Harrington Challenger. Pt no showed 09/10/20 for pre op echo.

## 2020-09-27 NOTE — Telephone Encounter (Signed)
Per Dr. Harrington Challenger, patient will need to complete echocardiogram before cardiac clearance can be assessed.  Patient was a no-show for the last echocardiogram.

## 2020-09-27 NOTE — Telephone Encounter (Signed)
Drema Dallas, [place], [place], CMA; Earvin Hansen [facility] Scheduling Good morning. I called the patient/[name](her daughter). She said Dr. Darryll Capers in [place] had previously did Echo and that is why the patient did not repeat. She will request they forward copy of results to Dr. Harrington Challenger.        Our office will await for echo to be sent to our office to be scanned into pt's chart.

## 2020-09-28 NOTE — Telephone Encounter (Signed)
[name] called in returning [place] call. Karna Christmas states she is going to reach out to the mother's pcp on Monday.She will have them re-fax the echo results.Please advise

## 2020-10-09 NOTE — Telephone Encounter (Signed)
S/w PCP office and they are faxing over today recent echo and carotid reports to fax # [phone]. Once received will have med rec scan in for pre op clearance and Dr. Harrington Challenger review.

## 2020-10-09 NOTE — Telephone Encounter (Signed)
Echo not yet scanned in. Please reach out to the PCP for fax of echo results. Please let me know if you can get these. I can have an in-office PA review records and determine preop clearance prior to sending them to be scanned in order to save time. Please fax to both [facility] and [facility].

## 2020-10-10 NOTE — Telephone Encounter (Signed)
Our office has received echo and carotid results from PCP. Echo results are needed for pre op clearance. I will have HIM Dept scan results into pt's chart today for pre op and MD to review for pre op clearance.

## 2020-10-11 NOTE — Telephone Encounter (Signed)
See notes from Pre op provider today, echo and carotid results have been reviewed. Pre op provider has left message for pt to call back to s/w pre op provider before final clearance is given.

## 2020-10-11 NOTE — Telephone Encounter (Signed)
   Name: ELAYSHA BEVARD  DOB: 06-19-1940  MRN: [id number]   Primary Cardiologist: Dorris Carnes, MD  Left voicemail again to call back for ongoing preop assessment.   Abigail Butts, PA-C 10/11/2020, 2:27 PM

## 2020-10-11 NOTE — Telephone Encounter (Signed)
Patient's daughter is returning call.

## 2020-10-11 NOTE — Telephone Encounter (Signed)
   Name: GLINDA NATZKE  DOB: 05-Jul-1940  MRN: [id number]   Primary Cardiologist: None  Chart reviewed as part of pre-operative protocol coverage.   Echocardiogram and carotid dopplers scanned and reviewed. Echo with EF 51%, no RWMA, and no significant valvular abnormalities. Carotid dopplers showed mild bilateral disease. Overall reassuring work-up.   Attempted to call patient for ongoing preop assessment; left voicemail.  Abigail Butts, PA-C 10/11/2020, 9:52 AM

## 2020-10-15 NOTE — Telephone Encounter (Signed)
Dr. Harrington Challenger reviewed echocardiogram and recommended lexiscan myoview to rule out ischemia as etiology of previously reported shortness of breath and chest tightness.   Will route her note to determine whether this testing needs to be completed prior to teeth extraction and TMJ athrocentesis.  Loel Dubonnet, NP

## 2020-10-15 NOTE — Telephone Encounter (Signed)
Yes, please set up for myovue

## 2020-10-16 ENCOUNTER — Encounter: Payer: Self-pay | Admitting: *Deleted

## 2020-10-16 NOTE — Addendum Note (Signed)
Addended by: Michae Kava on: 10/16/2020 02:21 PM   Modules accepted: Orders

## 2020-10-16 NOTE — Telephone Encounter (Signed)
Pre-op covering staff, Dr. Harrington Challenger recommended a Lexiscan Myoview at her last visit with the patient and would like this to be done before dental extractions. Can you please call patient and help schedule this?  Thank you!

## 2020-10-16 NOTE — Telephone Encounter (Signed)
I s/w pt's daughter Con Memos [facility]) in regards to pt will need a Lexiscan before she can be cleared for dental procedure. Advised I will place order for Myoview and will send message to scheduler to set up Myoview for pre op clearance. Con Memos thanked me for the call and the help.

## 2020-10-16 NOTE — Telephone Encounter (Signed)
Lexiscan order has been placed as well as the Attestation has been placed for Dr. Harrington Challenger to sign.

## 2020-10-17 ENCOUNTER — Telehealth: Payer: Self-pay

## 2020-10-17 ENCOUNTER — Telehealth: Payer: Self-pay | Admitting: *Deleted

## 2020-10-17 ENCOUNTER — Other Ambulatory Visit: Payer: Self-pay | Admitting: Internal Medicine

## 2020-10-17 DIAGNOSIS — R079 Chest pain, unspecified: Secondary | ICD-10-CM

## 2020-10-17 NOTE — Telephone Encounter (Signed)
This procedure has been fully reviewed with the patient and written informed consent has been obtained.

## 2020-10-17 NOTE — Telephone Encounter (Signed)
Detailed instructions left on the patient's answering machine. Asked to call back with any questions. S.Damarcus Reggio EMTP 

## 2020-10-17 NOTE — Addendum Note (Signed)
Addended by: Fay Records on: 10/17/2020 09:04 PM   Modules accepted: Orders

## 2020-10-17 NOTE — Addendum Note (Signed)
Addended by: Michae Kava on: 10/17/2020 08:20 AM   Modules accepted: Orders

## 2020-10-17 NOTE — Telephone Encounter (Signed)
-----   Message from Rodman Key, RN sent at 10/16/2020  5:54 PM EDT ----- [name] patient.

## 2020-10-17 NOTE — Telephone Encounter (Signed)
Will forward attestation to MD to sign.

## 2020-10-23 ENCOUNTER — Ambulatory Visit (HOSPITAL_COMMUNITY): Payer: Medicare Other | Attending: Cardiology

## 2020-10-23 ENCOUNTER — Other Ambulatory Visit: Payer: Self-pay

## 2020-10-23 DIAGNOSIS — Z01810 Encounter for preprocedural cardiovascular examination: Secondary | ICD-10-CM

## 2020-10-23 DIAGNOSIS — Z01818 Encounter for other preprocedural examination: Secondary | ICD-10-CM | POA: Diagnosis present

## 2020-10-23 DIAGNOSIS — R079 Chest pain, unspecified: Secondary | ICD-10-CM | POA: Diagnosis not present

## 2020-10-23 LAB — MYOCARDIAL PERFUSION IMAGING
LV dias vol: 49 mL (ref 46–106)
LV sys vol: 18 mL
Peak HR: 85 {beats}/min
Rest HR: 64 {beats}/min
SDS: 1
SRS: 6
SSS: 7
TID: 1.08

## 2020-10-23 MED ORDER — REGADENOSON 0.4 MG/5ML IV SOLN
0.4000 mg | Freq: Once | INTRAVENOUS | Status: AC
  Administered 2020-10-23: 0.4 mg via INTRAVENOUS

## 2020-10-23 MED ORDER — TECHNETIUM TC 99M TETROFOSMIN IV KIT
10.2000 | PACK | Freq: Once | INTRAVENOUS | Status: AC | PRN
  Administered 2020-10-23: 10.2 via INTRAVENOUS
  Filled 2020-10-23: qty 11

## 2020-10-23 MED ORDER — TECHNETIUM TC 99M TETROFOSMIN IV KIT
32.5000 | PACK | Freq: Once | INTRAVENOUS | Status: AC | PRN
  Administered 2020-10-23: 32.5 via INTRAVENOUS
  Filled 2020-10-23: qty 33

## 2020-10-24 NOTE — Telephone Encounter (Signed)
Myoview completed yesterday. Will send message to pre op pool to see if pt has been cleared.

## 2020-10-24 NOTE — Telephone Encounter (Signed)
Notes faxed to surgeon. This phone note will be removed from the preop pool. Richardson Dopp, PA-C  10/24/2020 12:44 PM

## 2020-10-24 NOTE — Telephone Encounter (Signed)
   Primary Cardiologist: Dorris Carnes, MD  Chart reviewed as part of pre-operative protocol coverage.   80 y.o. 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 Harrington Challenger.  Stress test is low risk.   Recommendations: . Therefore, based on ACC/AHA guidelines, the patient is at acceptable risk for the planned procedure without further cardiovascular testing.    Please call with questions. Richardson Dopp, PA-C 10/24/2020, 12:39 PM

## 2020-10-31 ENCOUNTER — Telehealth: Payer: Self-pay | Admitting: Internal Medicine

## 2020-10-31 NOTE — Telephone Encounter (Signed)
Clearance for dental procedure faxed to Dr. Lupita Leash office on 10/24/20.  Please see if pt is calling about this request.  If she needs a different procedure/surgery, will need a new clearance request.  If for dental procedure as described, make sure we are sending to correct provider and I can resend it.  Richardson Dopp, PA-C    10/31/2020 1:01 PM

## 2020-10-31 NOTE — Telephone Encounter (Signed)
Pt's daughter is returning call in regards to Ms. Folse's recent Stress test results. Please Advise  [name]/Daughter [phone]

## 2020-10-31 NOTE — Telephone Encounter (Signed)
Informed of result. Aware I will forward to pre op to readdress clearance need. Dtr [facility]) states this needs to be done stat d/t urgency for pt to have dental procedure performed.  Will route to [facility] for pre-op needs

## 2020-10-31 NOTE — Telephone Encounter (Signed)
Returned call to pt daughter, Karna Christmas, [place] regarding message below.  I left a detailed message that surgical clearance has been sent to Dr. Lupita Leash office for dental procedure and if she was calling about a different surgery, to call us back.

## 2020-12-03 ENCOUNTER — Encounter (HOSPITAL_COMMUNITY): Payer: Self-pay | Admitting: Oral Surgery

## 2020-12-03 NOTE — Progress Notes (Signed)
Cardiologist: Dorris Carnes, MD  EKG: 08/22/20 CXR: 04/16/20 ECHO: 08/28/20 Stress Test: 10/23/20 Cardiac Cath: 2014  Fasting Blood Sugar- 115-120 Checks Blood Sugar__1_ times a week  ASA/Blood Thinners: No  OSA/CPAP: No  Covid test not needed  Anesthesia Review: Yes, cardiac history  Patient denies shortness of breath, fever, cough, and chest pain at PAT appointment.  Patient verbalized understanding of instructions provided today at the PAT appointment.  Patient asked to review instructions at home and day of surgery.

## 2020-12-03 NOTE — H&P (Signed)
HISTORY AND PHYSICAL  [name] is a 80 y.o. female patient with CC: Painful teeth. Painful TMJ.  No diagnosis found.  Past Medical History:  Diagnosis Date   Anemia    Anxiety    Arthritis    "all over" (04/19/2013)   Bleeding stomach ulcer 1980's   Bloating    Chronic back pain    Chronic headaches    Chronic heartburn    Chronic neck pain    Chronic pain    Colon cancer (HCC)    DDD (degenerative disc disease), lumbar    Diabetic peripheral neuropathy ([place])    "in my feet" (04/19/2013)   Dizziness    Dysphagia    Family history of anesthesia complication    "daughter has PONV too" (04/19/2013)   Fibromyalgia    Gastroesophageal reflux    Gastroparesis    Glaucoma, bilateral    Hiatal hernia    "had it before; had OR; got it again" (04/19/2013)   History of blood transfusion 1980's   "w/bleeding stomach ulcer" (04/19/2013)   Hypertension    "for diabetes; don't have high blood pressure" (04/19/2013)   Migraine    "take RX for it qd" (04/19/2013)   Nausea    Pneumonia 04/2012   PONV (postoperative nausea and vomiting)    Renal insufficiency    TIA (transient ischemic attack)    "3-4 before starting RX; none since" (04/19/2013)   Type II diabetes mellitus ([place])    Walking pneumonia ~ 1966    No current facility-administered medications for this encounter.   Current Outpatient Medications  Medication Sig Dispense Refill   acetaminophen (TYLENOL) 500 MG tablet Take 500 mg by mouth every 6 (six) hours as needed for mild pain.      ALPRAZolam (XANAX XR) 1 MG 24 hr tablet Take 0.5-1 mg by mouth as needed for sleep (Anxiety).      cholestyramine (QUESTRAN) 4 g packet Take 4 g by mouth at bedtime.      Cyanocobalamin (VITAMIN B 12 PO) Take 2,000 mcg by mouth daily.     cyclobenzaprine (FLEXERIL) 10 MG tablet Take 10 mg by mouth at bedtime. *May take up to three times daily as needed for muscle spasms     dexlansoprazole (DEXILANT) 60 MG capsule Take 1 capsule  (60 mg total) by mouth daily before breakfast. 90 capsule 3   DULoxetine (CYMBALTA) 60 MG capsule Take 60 mg by mouth every morning.      famotidine (PEPCID) 40 MG tablet TAKE 1 TABLET BY MOUTH AT BEDTIME. 30 tablet 3   metFORMIN (GLUCOPHAGE) 500 MG tablet Take 500 mg by mouth 2 (two) times daily with a meal.     metoCLOPramide (REGLAN) 10 MG tablet Take 1 tablet (10 mg total) by mouth 3 (three) times daily before meals. (Patient taking differently: Take 10 mg by mouth 3 (three) times daily as needed. ) 270 tablet 1   metoCLOPramide (REGLAN) 10 MG tablet Take by mouth.     mometasone (NASONEX) 50 MCG/ACT nasal spray Place 2 sprays into the nose daily as needed (Allergies).      oxyCODONE-acetaminophen (PERCOCET) 7.5-325 MG tablet Take 1 tablet by mouth every 4 (four) hours as needed for severe pain.     polyethylene glycol (MIRALAX / GLYCOLAX) packet Take 17 g by mouth daily as needed for mild constipation.      silodosin (RAPAFLO) 8 MG CAPS capsule Take 8 mg by mouth at bedtime.      topiramate (  TOPAMAX) 100 MG tablet Take 1 tablet (100 mg total) by mouth 2 (two) times daily. (Patient taking differently: Take 100 mg by mouth at bedtime. ) 60 tablet 5   Travoprost, BAK Free, (TRAVATAN Z) 0.004 % SOLN ophthalmic solution Place 1 drop into both eyes at bedtime.      Allergies  Allergen Reactions   Botox [Onabotulinumtoxina]     Patient states that it became loose in her stomach , and it caused multiple problems.   Morphine Anaphylaxis and Other (See Comments)    "it will kill me" "made me stop breathing"   Aspirin Other (See Comments)    Gi bleed    Cefuroxime Axetil Nausea And Vomiting   Codeine Nausea And Vomiting   Diltiazem Nausea And Vomiting   Dronabinol Nausea And Vomiting and Palpitations   Shellfish Allergy Other (See Comments)    Blood sugar drops   Ondansetron Hives   Penicillins Rash    Did it involve swelling of the face/tongue/throat, SOB, or low BP? No Did it involve  sudden or severe rash/hives, skin peeling, or any reaction on the inside of your mouth or nose? Yes Did you need to seek medical attention at a hospital or doctor's office? Yes When did it last happen?  Over 10 years  If all above answers are "NO", may proceed with cephalosporin use.    Active Problems:   * No active hospital problems. *  Vitals: There were no vitals taken for this visit. Lab results:No results found for this or any previous visit (from the past 6 hour(s)). Radiology Results: No results found. General appearance: alert, cooperative, and no distress Head: Normocephalic, without obvious abnormality, atraumatic Eyes: negative Nose: Nares normal. Septum midline. Mucosa normal. No drainage or sinus tenderness. Throat: Multiple carious teeth. Bilateral TMJ clicking and popping. No trismus. Pharynx clear.  Neck: no adenopathy and supple, symmetrical, trachea midline Resp: clear to auscultation bilaterally Cardio: regular rate and rhythm, S1, S2 normal, no murmur, click, rub or gallop  Assessment: Multiple non-restorable teeth secondary to dental caries. TMJ osteoarthritis.  Plan: Multiple dental extractions with alveoloplasty. TMJ arthrocentesis.GA. Day surgery.   Diona Browner 12/03/2020

## 2020-12-03 NOTE — Progress Notes (Signed)
Anesthesia Chart Review:  Pt is a same day work up   Case: [id number] Date/Time: 12/04/20 1020   Procedure: DENTAL RESTORATION/EXTRACTIONS   Anesthesia type: General   Pre-op diagnosis: NON RESTORABLE   Location: [facility] OR ROOM 04 / [facility]   Surgeons: Diona Browner, DMD       DISCUSSION: Pt is 80 years old with hx CAD (mild by 2014 cath), HTN, DM, CKD, asthma    PROVIDERS: - PCP is Remi Haggard, FNP - Cardiologist is Dorris Carnes, MD. Last office visit 08/22/20 who cleared pt for surgery after low risk stress test, reassuring echo  LABS: Will be obtained day of surgery    IMAGES: CXR 04/17/20 (in the context of +COVID):  - Left greater than right patchy airspace opacities, compatible with multifocal pneumonia and reported COVID diagnosis.  - Recommend follow-up to resolution.    EKG 08/22/20: NSR 82 bpm   Poor R wave progression  Poss anterolateral MI (old)   CV: Nuclear stress test 10/23/20:  1. Fixed inferolateral perfusion defect with normal wall motion in this area, consistent with artifact.  There is significant extracardiac radiotracer activity adjacent to inferolateral wall, likely causing artifact 2. Low risk study  Echo 08/28/20:  1.  LVH.  Estimated LVEF 51%.  LV diastolic dysfunction present. 2.  LA cavity normal in size. 3.  RA cavity normal in size. 4.  RV cavity normal in size with normal RV function. 5.  Normal trileaflet aortic valve with no regurgitation 6.  Normal mitral valve with no regurgitation 7.  Trace tricuspid valve regurgitation 8.  Normal pulmonic valve with no regurgitation 9.  Aortic root normal 10.  Pulmonary artery normal 11.  IVC normal with respiratory variation 12.  No evidence of thrombus 13.  No evidence of pericardial effusion  Carotid duplex 08/28/20 (report found on pg 3-4 of echo report from 08/28/20):  - Right: 1-39% stenosis at the bulb and ICA.  Minimal to mild plaque noted. - Left: 1-39% stenosis at the bulb and ICA.  Minimal to  mild plaque noted.  Cardiac cath 04/19/13:  Left mainstem: Normal Left anterior descending (LAD): 30% disease in the mid vessel. 40% distally. Left circumflex (LCx): 30-40% distal OM1. Otherwise mild irregularities. Right coronary artery (RCA): 20% disease proximally, 30% at the crux, 40-50% distally. Left ventriculography: Left ventricular systolic function is normal, LVEF is estimated at 55-65%, there is no significant mitral regurgitation Final Conclusions:   1. Nonobstructive CAD 2. Normal LV function   Past Medical History:  Diagnosis Date   Anemia    Anxiety    Arthritis    "all over" (04/19/2013)   Asthma    Bleeding stomach ulcer 02/08/1979   Bloating    Chronic back pain    Chronic headaches    Chronic heartburn    Chronic neck pain    Chronic pain    Colon cancer (HCC)    DDD (degenerative disc disease), lumbar    Diabetic peripheral neuropathy ([place])    "in my feet" (04/19/2013)   Dizziness    Dysphagia    Family history of anesthesia complication    "daughter has PONV too" (04/19/2013)   Fibromyalgia    Gastroesophageal reflux    Gastroparesis    Glaucoma, bilateral    Hiatal hernia    "had it before; had OR; got it again" (04/19/2013)   History of blood transfusion 02/08/1979   "w/bleeding stomach ulcer" (04/19/2013)   History of kidney stones  Hypertension    "for diabetes; don't have high blood pressure" (04/19/2013)   Migraine    "take RX for it qd" (04/19/2013)   Nausea    Pneumonia 04/09/2012   PONV (postoperative nausea and vomiting)    Renal insufficiency    TIA (transient ischemic attack)    "3-4 before starting RX; none since" (04/19/2013)   Type II diabetes mellitus ([place])    Walking pneumonia ~ 1966    Past Surgical History:  Procedure Laterality Date   ABDOMINAL ADHESION SURGERY  ~ 2012   "repaired wrap where they did hiatal hernia OR too" 911/04/2013)   ANTERIOR CERVICAL DISCECTOMY  ~ 2009   "only cleaned out arthritis and  spurs" (04/19/2013)   CATARACT EXTRACTION W/ INTRAOCULAR LENS IMPLANT Left 04/13/2013   CHOLECYSTECTOMY  1980's   COLON SURGERY     COLONOSCOPY  10/11/2010   COLONOSCOPY  11/23/2009   COLONOSCOPY  07/28/2008   W/SNARE   COLONOSCOPY  06/28/07   COLONOSCOPY  05/10/07   W/POLYP   COLONOSCOPY  12/28/00   COLONOSCOPY N/A 06/29/2015   Procedure: COLONOSCOPY;  Surgeon: Rogene Houston, MD;  [facility];  Service: Endoscopy;  Laterality: N/A;  135   COLONOSCOPY WITH ESOPHAGOGASTRODUODENOSCOPY (EGD) N/A 05/13/2013   Procedure: COLONOSCOPY WITH ESOPHAGOGASTRODUODENOSCOPY (EGD);  Surgeon: Rogene Houston, MD;  [facility]: [facility];  Service: Endoscopy;  Laterality: N/A;  855   ESOPHAGOGASTRODUODENOSCOPY (EGD) WITH PROPOFOL N/A 10/06/2018   Procedure: ESOPHAGOGASTRODUODENOSCOPY (EGD) WITH PROPOFOL;  Surgeon: Rogene Houston, MD;  [facility]: [facility];  Service: Endoscopy;  Laterality: N/A;   HEMICOLECTOMY  2010   ZIEGLER   HIATAL HERNIA REPAIR  1970's   LEFT HEART CATHETERIZATION WITH CORONARY ANGIOGRAM N/A 04/19/2013   Procedure: LEFT HEART CATHETERIZATION WITH CORONARY ANGIOGRAM;  Surgeon: Peter M Martinique, MD;  Location: [facility] CATH LAB;  Service: Cardiovascular;  Laterality: N/A;   POLYPECTOMY  10/06/2018   Procedure: POLYPECTOMY;  Surgeon: Rogene Houston, MD;  Location: [facility];  Service: Endoscopy;;  gastric   [place]   "had a broken neck" (04/19/2013)   TONSILLECTOMY  ~ [place]  10/11/2010   EGD ED   UPPER GASTROINTESTINAL ENDOSCOPY  11/23/2009   UPPER GASTROINTESTINAL ENDOSCOPY  05/10/07   EGD ED   UPPER GASTROINTESTINAL ENDOSCOPY  08/11/01   EGD ED   VAGINAL HYSTERECTOMY      MEDICATIONS: No current facility-administered medications for this encounter.    acetaminophen (TYLENOL) 500 MG tablet   ALPRAZolam (XANAX XR) 1 MG 24 hr tablet   cholestyramine (QUESTRAN) 4 g packet   Cyanocobalamin (VITAMIN B  12 PO)   cyclobenzaprine (FLEXERIL) 10 MG tablet   dexlansoprazole (DEXILANT) 60 MG capsule   DULoxetine (CYMBALTA) 60 MG capsule   famotidine (PEPCID) 40 MG tablet   metFORMIN (GLUCOPHAGE) 500 MG tablet   metoCLOPramide (REGLAN) 10 MG tablet   metoCLOPramide (REGLAN) 10 MG tablet   mometasone (NASONEX) 50 MCG/ACT nasal spray   oxyCODONE-acetaminophen (PERCOCET) 7.5-325 MG tablet   polyethylene glycol (MIRALAX / GLYCOLAX) packet   silodosin (RAPAFLO) 8 MG CAPS capsule   topiramate (TOPAMAX) 100 MG tablet   Travoprost, BAK Free, (TRAVATAN Z) 0.004 % SOLN ophthalmic solution    If labs acceptable day of surgery, I anticipate pt can proceed with surgery as scheduled.  Willeen Cass, PhD, FNP-BC [facility]/Anesthesiology Phone: [phone] 12/03/2020 3:09 PM'

## 2020-12-03 NOTE — Anesthesia Preprocedure Evaluation (Addendum)
Anesthesia Evaluation  Patient identified by MRN, date of birth, ID band Patient awake    Reviewed: Allergy & Precautions, NPO status , Patient's Chart, lab work & pertinent test results  History of Anesthesia Complications (+) PONV  Airway Mallampati: II  TM Distance: >3 FB Neck ROM: Full    Dental  (+) Poor Dentition   Pulmonary asthma , former smoker,    Pulmonary exam normal breath sounds clear to auscultation       Cardiovascular hypertension, Pt. on medications + angina + CAD  Normal cardiovascular exam Rhythm:Regular Rate:Normal     Neuro/Psych  Headaches, Anxiety Dementia TIA Neuromuscular disease negative psych ROS   GI/Hepatic Neg liver ROS, GERD  ,  Endo/Other  negative endocrine ROSdiabetes, Type 2  Renal/GU Renal InsufficiencyRenal disease  negative genitourinary   Musculoskeletal  (+) Arthritis , Osteoarthritis,  Fibromyalgia -  Abdominal   Peds negative pediatric ROS (+)  Hematology negative hematology ROS (+) anemia ,   Anesthesia Other Findings   Reproductive/Obstetrics negative OB ROS                            Anesthesia Physical Anesthesia Plan  ASA: 3  Anesthesia Plan: General   Post-op Pain Management:    Induction: Intravenous  PONV Risk Score and Plan: 4 or greater and Ondansetron, Dexamethasone, Midazolam, Droperidol and Treatment may vary due to age or medical condition  Airway Management Planned: Nasal ETT  Additional Equipment:   Intra-op Plan:   Post-operative Plan: Extubation in OR  Informed Consent: I have reviewed the patients History and Physical, chart, labs and discussed the procedure including the risks, benefits and alternatives for the proposed anesthesia with the patient or authorized representative who has indicated his/her understanding and acceptance.     Dental advisory given  Plan Discussed with: CRNA  Anesthesia Plan  Comments: (See APP note by Durel Salts, FNP )       Anesthesia Quick Evaluation

## 2020-12-04 ENCOUNTER — Encounter (HOSPITAL_COMMUNITY): Payer: Self-pay | Admitting: Oral Surgery

## 2020-12-04 ENCOUNTER — Other Ambulatory Visit: Payer: Self-pay

## 2020-12-04 ENCOUNTER — Ambulatory Visit (HOSPITAL_COMMUNITY): Payer: Medicare Other | Admitting: Emergency Medicine

## 2020-12-04 ENCOUNTER — Ambulatory Visit (HOSPITAL_COMMUNITY)
Admission: RE | Admit: 2020-12-04 | Discharge: 2020-12-04 | Disposition: A | Discharge status: Home / Self Care | Payer: Medicare Other | Attending: Oral Surgery | Admitting: Oral Surgery

## 2020-12-04 ENCOUNTER — Encounter (HOSPITAL_COMMUNITY)
Admission: RE | Disposition: A | Discharge status: Home / Self Care | Payer: Self-pay | Source: Home / Self Care | Attending: Oral Surgery

## 2020-12-04 DIAGNOSIS — Z880 Allergy status to penicillin: Secondary | ICD-10-CM | POA: Insufficient documentation

## 2020-12-04 DIAGNOSIS — K029 Dental caries, unspecified: Secondary | ICD-10-CM | POA: Insufficient documentation

## 2020-12-04 DIAGNOSIS — Z885 Allergy status to narcotic agent status: Secondary | ICD-10-CM | POA: Diagnosis not present

## 2020-12-04 DIAGNOSIS — Z886 Allergy status to analgesic agent status: Secondary | ICD-10-CM | POA: Insufficient documentation

## 2020-12-04 DIAGNOSIS — Z79899 Other long term (current) drug therapy: Secondary | ICD-10-CM | POA: Diagnosis not present

## 2020-12-04 DIAGNOSIS — Z8673 Personal history of transient ischemic attack (TIA), and cerebral infarction without residual deficits: Secondary | ICD-10-CM | POA: Diagnosis not present

## 2020-12-04 DIAGNOSIS — M26603 Bilateral temporomandibular joint disorder, unspecified: Secondary | ICD-10-CM | POA: Diagnosis not present

## 2020-12-04 DIAGNOSIS — Z7984 Long term (current) use of oral hypoglycemic drugs: Secondary | ICD-10-CM | POA: Diagnosis not present

## 2020-12-04 HISTORY — DX: Personal history of urinary calculi: Z87.442

## 2020-12-04 HISTORY — PX: TOOTH EXTRACTION: SHX859

## 2020-12-04 HISTORY — DX: Unspecified asthma, uncomplicated: J45.909

## 2020-12-04 LAB — BASIC METABOLIC PANEL
Anion gap: 15 (ref 5–15)
BUN: 7 mg/dL — ABNORMAL LOW (ref 8–23)
CO2: 23 mmol/L (ref 22–32)
Calcium: 9.1 mg/dL (ref 8.9–10.3)
Chloride: 102 mmol/L (ref 98–111)
Creatinine, Ser: 0.8 mg/dL (ref 0.44–1.00)
GFR, Estimated: >60 mL/min (ref >60)
Glucose, Bld: 172 mg/dL — ABNORMAL HIGH (ref 70–99)
Potassium: 3.4 mmol/L — ABNORMAL LOW (ref 3.5–5.1)
Sodium: 140 mmol/L (ref 135–145)

## 2020-12-04 LAB — CBC
HCT: 40.9 % (ref 36.0–46.0)
Hemoglobin: 12.4 g/dL (ref 12.0–15.0)
MCH: 30.1 pg (ref 26.0–34.0)
MCHC: 30.3 g/dL (ref 30.0–36.0)
MCV: 99.3 fL (ref 80.0–100.0)
Platelets: 223 10*3/uL (ref 150–400)
RBC: 4.12 MIL/uL (ref 3.87–5.11)
RDW: 13.9 % (ref 11.5–15.5)
WBC: 7.4 10*3/uL (ref 4.0–10.5)
nRBC: 0 % (ref 0.0–0.2)

## 2020-12-04 LAB — GLUCOSE, CAPILLARY
Glucose-Capillary: 178 mg/dL — ABNORMAL HIGH (ref 70–99)
Glucose-Capillary: 131 mg/dL — ABNORMAL HIGH (ref 70–99)
Glucose-Capillary: 167 mg/dL — ABNORMAL HIGH (ref 70–99)

## 2020-12-04 SURGERY — DENTAL RESTORATION/EXTRACTIONS
Anesthesia: General | Laterality: Bilateral

## 2020-12-04 MED ORDER — DEXAMETHASONE SODIUM PHOSPHATE 10 MG/ML IJ SOLN
INTRAMUSCULAR | Status: DC | PRN
  Administered 2020-12-04: 20 mg

## 2020-12-04 MED ORDER — PROPOFOL 10 MG/ML IV BOLUS
INTRAVENOUS | Status: DC | PRN
  Administered 2020-12-04: 120 mg via INTRAVENOUS

## 2020-12-04 MED ORDER — LIDOCAINE 2% (20 MG/ML) 5 ML SYRINGE
INTRAMUSCULAR | Status: DC | PRN
  Administered 2020-12-04: 60 mg via INTRAVENOUS

## 2020-12-04 MED ORDER — PHENYLEPHRINE 40 MCG/ML (10ML) SYRINGE FOR IV PUSH (FOR BLOOD PRESSURE SUPPORT)
PREFILLED_SYRINGE | INTRAVENOUS | Status: AC
  Filled 2020-12-04: qty 10

## 2020-12-04 MED ORDER — BUPIVACAINE-EPINEPHRINE 0.5% -1:200000 IJ SOLN
INTRAMUSCULAR | Status: AC
  Filled 2020-12-04: qty 1

## 2020-12-04 MED ORDER — ORAL CARE MOUTH RINSE: 15.0000 mL | Freq: Once | OROMUCOSAL | Status: AC

## 2020-12-04 MED ORDER — ROCURONIUM BROMIDE 10 MG/ML (PF) SYRINGE
PREFILLED_SYRINGE | INTRAVENOUS | Status: AC
  Filled 2020-12-04: qty 10

## 2020-12-04 MED ORDER — DROPERIDOL 2.5 MG/ML IJ SOLN
INTRAMUSCULAR | Status: AC
  Filled 2020-12-04: qty 2

## 2020-12-04 MED ORDER — ONDANSETRON HCL 4 MG/2ML IJ SOLN: INTRAMUSCULAR | Status: DC | PRN

## 2020-12-04 MED ORDER — OXYCODONE HCL 5 MG PO TABS: 5.0000 mg | ORAL_TABLET | Freq: Once | ORAL | Status: DC | PRN

## 2020-12-04 MED ORDER — FENTANYL CITRATE (PF) 250 MCG/5ML IJ SOLN
INTRAMUSCULAR | Status: AC
  Filled 2020-12-04: qty 5

## 2020-12-04 MED ORDER — OXYCODONE HCL 5 MG PO TABS: 5.0000 mg | ORAL_TABLET | ORAL | 0 refills | Status: DC | PRN

## 2020-12-04 MED ORDER — SODIUM CHLORIDE 0.9 % IR SOLN
Status: DC | PRN
  Administered 2020-12-04: 1000 mL

## 2020-12-04 MED ORDER — ONDANSETRON HCL 4 MG/2ML IJ SOLN
INTRAMUSCULAR | Status: AC
  Filled 2020-12-04: qty 2

## 2020-12-04 MED ORDER — EPHEDRINE SULFATE-NACL 50-0.9 MG/10ML-% IV SOSY
PREFILLED_SYRINGE | INTRAVENOUS | Status: DC | PRN
  Administered 2020-12-04: 10 mg via INTRAVENOUS
  Administered 2020-12-04 (×2): 15 mg via INTRAVENOUS

## 2020-12-04 MED ORDER — PROMETHAZINE HCL 25 MG/ML IJ SOLN
6.2500 mg | INTRAMUSCULAR | Status: DC | PRN
Start: 2020-12-04 — End: 2020-12-04

## 2020-12-04 MED ORDER — OXYCODONE HCL 5 MG/5ML PO SOLN: 5.0000 mg | Freq: Once | ORAL | Status: DC | PRN

## 2020-12-04 MED ORDER — HYDROMORPHONE HCL 1 MG/ML IJ SOLN: 0.2500 mg | INTRAMUSCULAR | Status: DC | PRN

## 2020-12-04 MED ORDER — ROCURONIUM BROMIDE 10 MG/ML (PF) SYRINGE
PREFILLED_SYRINGE | INTRAVENOUS | Status: DC | PRN
  Administered 2020-12-04: 60 mg via INTRAVENOUS

## 2020-12-04 MED ORDER — PHENYLEPHRINE HCL-NACL 10-0.9 MG/250ML-% IV SOLN
INTRAVENOUS | Status: DC | PRN
  Administered 2020-12-04: 60 ug/min via INTRAVENOUS

## 2020-12-04 MED ORDER — OXYMETAZOLINE HCL 0.05 % NA SOLN
NASAL | Status: DC | PRN
  Administered 2020-12-04: 1

## 2020-12-04 MED ORDER — DEXAMETHASONE SODIUM PHOSPHATE 10 MG/ML IJ SOLN
INTRAMUSCULAR | Status: AC
  Filled 2020-12-04: qty 1

## 2020-12-04 MED ORDER — PHENYLEPHRINE 40 MCG/ML (10ML) SYRINGE FOR IV PUSH (FOR BLOOD PRESSURE SUPPORT)
PREFILLED_SYRINGE | INTRAVENOUS | Status: DC | PRN
  Administered 2020-12-04: 80 ug via INTRAVENOUS
  Administered 2020-12-04: 120 ug via INTRAVENOUS

## 2020-12-04 MED ORDER — FENTANYL CITRATE (PF) 250 MCG/5ML IJ SOLN
INTRAMUSCULAR | Status: DC | PRN
  Administered 2020-12-04: 150 ug via INTRAVENOUS

## 2020-12-04 MED ORDER — OXYMETAZOLINE HCL 0.05 % NA SOLN
NASAL | Status: AC
  Filled 2020-12-04: qty 30

## 2020-12-04 MED ORDER — LIDOCAINE 2% (20 MG/ML) 5 ML SYRINGE
INTRAMUSCULAR | Status: AC
  Filled 2020-12-04: qty 5

## 2020-12-04 MED ORDER — LACTATED RINGERS IV SOLN: INTRAVENOUS | Status: DC

## 2020-12-04 MED ORDER — DEXAMETHASONE SODIUM PHOSPHATE 10 MG/ML IJ SOLN
INTRAMUSCULAR | Status: DC | PRN
  Administered 2020-12-04: 5 mg via INTRAVENOUS

## 2020-12-04 MED ORDER — PROPOFOL 10 MG/ML IV BOLUS
INTRAVENOUS | Status: AC
  Filled 2020-12-04: qty 20

## 2020-12-04 MED ORDER — 0.9 % SODIUM CHLORIDE (POUR BTL) OPTIME
TOPICAL | Status: DC | PRN
  Administered 2020-12-04: 1000 mL

## 2020-12-04 MED ORDER — DROPERIDOL 2.5 MG/ML IJ SOLN
INTRAMUSCULAR | Status: DC | PRN
  Administered 2020-12-04: .625 mg via INTRAVENOUS

## 2020-12-04 MED ORDER — BUPIVACAINE-EPINEPHRINE (PF) 0.5% -1:200000 IJ SOLN
INTRAMUSCULAR | Status: DC | PRN
  Administered 2020-12-04: 10 mL via PERINEURAL
  Administered 2020-12-04: 8 mL via PERINEURAL

## 2020-12-04 MED ORDER — SUGAMMADEX SODIUM 200 MG/2ML IV SOLN
INTRAVENOUS | Status: DC | PRN
  Administered 2020-12-04: 200 mg via INTRAVENOUS

## 2020-12-04 MED ORDER — CLINDAMYCIN PHOSPHATE 600 MG/50ML IV SOLN
600.0000 mg | INTRAVENOUS | Status: AC
  Administered 2020-12-04: 11:00:00 600 mg via INTRAVENOUS
  Filled 2020-12-04: qty 50

## 2020-12-04 MED ORDER — CHLORHEXIDINE GLUCONATE 0.12 % MT SOLN
15.0000 mL | Freq: Once | OROMUCOSAL | Status: AC
  Administered 2020-12-04: 09:00:00 15 mL via OROMUCOSAL
  Filled 2020-12-04: qty 15

## 2020-12-04 SURGICAL SUPPLY — 40 items
BLADE SURG 15 STRL LF DISP TIS (BLADE) ×1 IMPLANT
BLADE SURG 15 STRL SS (BLADE) ×3
BUR CROSS CUT FISSURE 1.6 (BURR) ×2 IMPLANT
BUR CROSS CUT FISSURE 1.6MM (BURR) ×1
BUR EGG ELITE 4.0 (BURR) ×2 IMPLANT
BUR EGG ELITE 4.0MM (BURR) ×1
CANISTER SUCT 3000ML PPV (MISCELLANEOUS) ×3 IMPLANT
COVER SURGICAL LIGHT HANDLE (MISCELLANEOUS) ×3 IMPLANT
COVER WAND RF STERILE (DRAPES) IMPLANT
DECANTER SPIKE VIAL GLASS SM (MISCELLANEOUS) ×3 IMPLANT
DRAPE U-SHAPE 76X120 STRL (DRAPES) ×3 IMPLANT
GAUZE PACKING FOLDED 2  STR (GAUZE/BANDAGES/DRESSINGS) ×3
GAUZE PACKING FOLDED 2 STR (GAUZE/BANDAGES/DRESSINGS) ×1 IMPLANT
GLOVE SURG ENC MOIS LTX SZ6.5 (GLOVE) IMPLANT
GLOVE SURG ENC MOIS LTX SZ7 (GLOVE) IMPLANT
GLOVE SURG ENC MOIS LTX SZ8 (GLOVE) ×3 IMPLANT
GLOVE SURG UNDER POLY LF SZ6.5 (GLOVE) IMPLANT
GLOVE SURG UNDER POLY LF SZ7 (GLOVE) IMPLANT
GOWN STRL REUS W/ TWL LRG LVL3 (GOWN DISPOSABLE) ×1 IMPLANT
GOWN STRL REUS W/ TWL XL LVL3 (GOWN DISPOSABLE) ×1 IMPLANT
GOWN STRL REUS W/TWL LRG LVL3 (GOWN DISPOSABLE) ×3
GOWN STRL REUS W/TWL XL LVL3 (GOWN DISPOSABLE) ×3
IV NS 1000ML (IV SOLUTION) ×3
IV NS 1000ML BAXH (IV SOLUTION) ×1 IMPLANT
KIT BASIN OR (CUSTOM PROCEDURE TRAY) ×3 IMPLANT
KIT TURNOVER KIT B (KITS) ×3 IMPLANT
NDL HYPO 25GX1X1/2 BEV (NEEDLE) ×2 IMPLANT
NDL SPNL 22GX3.5 QUINCKE BK (NEEDLE) IMPLANT
NEEDLE HYPO 25GX1X1/2 BEV (NEEDLE) ×6 IMPLANT
NEEDLE SPNL 22GX3.5 QUINCKE BK (NEEDLE) ×12 IMPLANT
NS IRRIG 1000ML POUR BTL (IV SOLUTION) ×3 IMPLANT
PAD ARMBOARD 7.5X6 YLW CONV (MISCELLANEOUS) ×3 IMPLANT
SLEEVE IRRIGATION ELITE 7 (MISCELLANEOUS) ×3 IMPLANT
SPONGE SURGIFOAM ABS GEL 12-7 (HEMOSTASIS) IMPLANT
SUT CHROMIC 3 0 PS 2 (SUTURE) ×5 IMPLANT
SYR BULB IRRIG 60ML STRL (SYRINGE) ×3 IMPLANT
SYR CONTROL 10ML LL (SYRINGE) ×3 IMPLANT
TRAY ENT MC OR (CUSTOM PROCEDURE TRAY) ×3 IMPLANT
TUBING IRRIGATION (MISCELLANEOUS) ×3 IMPLANT
YANKAUER SUCT BULB TIP NO VENT (SUCTIONS) ×3 IMPLANT

## 2020-12-04 NOTE — Anesthesia Postprocedure Evaluation (Signed)
Anesthesia Post Note  Patient: [name]  Procedure(s) Performed: BILATERAL TEMPOROMANDIBULAR JOINT ARTHROCENTESIS; DENTAL EXTRACTION TEETH #3,6,7,8,9,10,11,12,19,23,24,25,26,32 WITH ALVEOLOPLASTY (Bilateral)     Patient location during evaluation: PACU Anesthesia Type: General Level of consciousness: awake and alert Pain management: pain level controlled Vital Signs Assessment: post-procedure vital signs reviewed and stable Respiratory status: spontaneous breathing, nonlabored ventilation and respiratory function stable Cardiovascular status: blood pressure returned to baseline and stable Postop Assessment: no apparent nausea or vomiting Anesthetic complications: no   No notable events documented.  Last Vitals:  Vitals:   12/04/20 1220 12/04/20 1232  BP: (!) 143/75 (!) 158/69  Pulse: 68 68  Resp: 14   Temp:  (!) 36.1 C  SpO2: 98% 98%    Last Pain:  Vitals:   12/04/20 1232  PainSc: 0-No pain                 Lynda Rainwater

## 2020-12-04 NOTE — Transfer of Care (Signed)
Immediate Anesthesia Transfer of Care Note  Patient: [name]  Procedure(s) Performed: BILATERAL TEMPOROMANDIBULAR JOINT ARTHROCENTESIS; DENTAL EXTRACTION TEETH #3,6,7,8,9,10,11,12,19,23,24,25,26,32 WITH ALVEOLOPLASTY (Bilateral)  Patient Location: PACU  Anesthesia Type:General  Level of Consciousness: awake and alert   Airway & Oxygen Therapy: Patient Spontanous Breathing and Patient connected to face mask oxygen  Post-op Assessment: Report given to RN and Post -op Vital signs reviewed and stable  Post vital signs: Reviewed and stable  Last Vitals:  Vitals Value Taken Time  BP 157/70 12/04/20 1153  Temp    Pulse 70 12/04/20 1155  Resp 12 12/04/20 1155  SpO2 100 % 12/04/20 1155  Vitals shown include unvalidated device data.  Last Pain:  Vitals:   12/04/20 0826  PainSc: 7       Patients Stated Pain Goal: 5 (82/95/62 1308)  Complications: No notable events documented.

## 2020-12-04 NOTE — Op Note (Signed)
NAME: TAMARAH, BHULLAR MEDICAL RECORD NO: [id number] ACCOUNT NO: [id number] DATE OF BIRTH: [birth year] FACILITY: [facility] LOCATION: [facility] PHYSICIAN: Gae Bon, DDS  Operative Report   DATE OF PROCEDURE: 12/04/2020  PREOPERATIVE DIAGNOSES:  Dental caries, nonrestorable teeth numbers 3, 6, 7, 8, 9, 10, 11, 12, 19, 23, 24, 25, 26,  32.  Bilateral temporomandibular joint disk displacement.  POSTOPERATIVE DIAGNOSES:  Dental caries, nonrestorable teeth numbers 3, 6, 7, 8, 9, 10, 11, 12, 19, 23, 24, 25, 26, 28, 32.  Bilateral temporomandibular joint disk displacement.  PROCEDURES:  Bilateral temporomandibular joint arthrocentesis.  Dental extraction teeth numbers 3, 6, 7, 8, 9, 10, 11, 12, 19, 23, 24, 25, 26, 28, 32.  Alveoplasty right and left maxilla and mandible.  SURGEON:  Gae Bon, DDS  ANESTHESIA:  General, nasal intubation.  Attending was Dr. Candida Peeling.  DESCRIPTION OF PROCEDURE:  The patient was taken to the operating room and placed on the table in supine position.  General anesthesia was administered, and a nasal endotracheal tube was placed and secured.  The eyes were protected, and the patient was  draped for surgery.  Timeout was performed.  The patient was prepped and draped for the procedure.  The TMJ arthrocentesis was performed first.  The right side was operated first.  The joint was palpated, and local anesthesia was injected 0.5% Marcaine  with 1:200,000 epinephrine into the joint space itself.  A second 21-gauge needle was introduced into the joint anteriorly to the first, and then normal saline was irrigated into the joint.  Good outflow was not obtained.  The needles were repositioned  multiple times, but a good outflow could not be achieved.  At this point, Marcaine was injected into the joint 1 mL and Decadron 5 mL was injected into the joint, and the needles were withdrawn.  The left side was operated next. Similar technique was  performed using local  anesthesia Marcaine to inject into the joint capsule.  Then, a second needle was placed anterior and inferior to the first needle 21-gauge and then irrigation was set up and good outflow was achieved.  30 mL was irrigated through  the joint.  Then, Marcaine was infiltrated into the joint 1 mL and Decadron 10 mg.  Then, attention was turned to the oral cavity.  The posterior pharynx was suctioned.  A throat pack was placed.  2% lidocaine with 1:100,000 epinephrine was infiltrated  in an inferior alveolar block on the right and left sides and buccal and palatal infiltration of the maxilla around the maxillary teeth to be removed.  A bite block was placed on the right side of the mouth.  A sweetheart retractor was used to retract  the tongue.  The left side was operated first.  A 15 blade was used to make an incision around tooth number 19 in the gingival sulcus and then around teeth numbers 23, 24, 25, and 26.  The periosteum was reflected from around these teeth.  Teeth were  elevated.  Tooth number 19 fractured upon attempted removal with a dental forceps, so the Stryker handpiece was used with a fissure bur to section the tooth and remove the roots individually.  Then, in the anterior mandible, teeth numbers 23, 24, 25, and  26 were removed with dental forceps.  The sockets were curetted.  The periosteum was reflected.  The alveolar bone in the anterior mandible was irregular in contour owing to periodontal disease.  Alveoplasty was performed using the rongeur and  the  egg-shaped bur followed by the bone file.  Then, the area was irrigated and closed with 3-0 chromic as was the area of tooth number 19.  Then, the left maxilla was operated first.  An incision was made around teeth numbers 12, 11, 10, 9, 8, and 7.  The  periosteum was reflected from around these teeth.  The teeth were elevated and removed with dental forceps.  The sockets were curetted and irrigated.  The periosteum was reflected to expose the  alveolar crest.  There were multiple undercuts and contour  deformities.  Alveoplasty was performed using the egg-shaped bur under irrigation and Stryker handpiece followed by the bone file.  Then, this area was irrigated and closed with 3-0 chromic.  Attention was then turned to the right mandible.  An incision  was made beginning at tooth number 32 drawn forward to tooth number 28.  The periosteum was reflected.  Tooth number 32 required removal of bone and sectioning prior to removal with the 301 elevator and the rongeurs.  Tooth number 28 was mobile and was  removed with the Ash forceps.  The periosteum was reflected to expose the alveolar crest.  Alveoplasty was performed using the egg-shaped bur and the bone file.  Then, in the right maxilla, teeth numbers 3 and 6 were removed using the 15 blade to make a  full-thickness incision around the teeth and then crestal incision joining the two teeth together.  The periosteum was reflected.  The teeth were elevated.  Tooth 3 required sectioning and removal of roots individually with the rongeur.  Then, periosteum  was reflected and alveoplasty was performed using egg bur followed by the bone file.  Then, areas were irrigated and closed with 3-0 chromic.  The oral cavity was then irrigated and suctioned.  The throat pack was removed.  The patient was left under  the care of anesthesia for extubation and transport to recovery room with plans for discharge home through day surgery.  ESTIMATED BLOOD LOSS:  Minimal.  COMPLICATIONS:  None.  SPECIMENS:  None.   [facility] D: 12/04/2020 11:52:13 am T: 12/04/2020 1:45:00 pm  JOB: [id number]/ [id number]

## 2020-12-04 NOTE — H&P (Signed)
H&P documentation  -History and Physical Reviewed  -Patient has been re-examined  -No change in the plan of care  [name]  

## 2020-12-04 NOTE — Anesthesia Procedure Notes (Signed)
Procedure Name: Intubation Date/Time: 12/04/2020 10:38 AM Performed by: Myna Bright, CRNA Pre-anesthesia Checklist: Patient identified, Emergency Drugs available, Patient being monitored and Suction available Patient Re-evaluated:Patient Re-evaluated prior to induction Oxygen Delivery Method: Circle system utilized Preoxygenation: Pre-oxygenation with 100% oxygen Induction Type: IV induction Ventilation: Mask ventilation without difficulty Laryngoscope Size: Mac and 3 Grade View: Grade II Nasal Tubes: Left, Magill forceps- large, utilized and Nasal prep performed Tube size: 6.0 mm Number of attempts: 1 Placement Confirmation: positive ETCO2, ETT inserted through vocal cords under direct vision and breath sounds checked- equal and bilateral Tube secured with: Tape Dental Injury: Bloody posterior oropharynx  Comments: Easy mask airway. DL x 1 with MAC 3. Grade II view. Attempted to place nasal Dwyane Luo in right nostril, but met resistance and switched to left nostril. Scant blood noted in oropharynx, but otherwise atraumatic nasal intubation.

## 2020-12-04 NOTE — Op Note (Signed)
12/04/2020  11:44 AM  PATIENT:  Cathren Laine  80 y.o. female  PRE-OPERATIVE DIAGNOSIS:  DENTAL CARIES, NON-RESTORABLE TEETH # 3, 6, 7, 8, 9, 10, 11, 12, 19, 23, 24, 25, 26, 32, BILATERAL TEMPOROMANDIBULAR JOINT DISC DISPLACEMENT  POST-OPERATIVE DIAGNOSIS:  SAME  PROCEDURE:  Procedure(s): BILATERAL TEMPOROMANDIBULAR JOINT ARTHROCENTESIS; DENTAL EXTRACTION TEETH # 3, 6, 7, 8, 9, 10, 11, 12, 19, 23, 24, 25, 26, 32, WITH ALVEOLOPLASTY RIGHT AND LEFT MAXILLA AND MANDIBLE  SURGEON:  Surgeon(s): Diona Browner, DMD  ANESTHESIA:   local and general  EBL:  minimal  DRAINS: none   SPECIMEN:  No Specimen  COUNTS:  YES  PLAN OF CARE: Discharge to home after PACU  PATIENT DISPOSITION:  PACU - hemodynamically stable.   PROCEDURE DETAILS: Dictation #[id number]  Gae Bon, DMD 12/04/2020 11:44 AM

## 2020-12-05 ENCOUNTER — Encounter (HOSPITAL_COMMUNITY): Payer: Self-pay | Admitting: Oral Surgery

## 2020-12-11 ENCOUNTER — Encounter (INDEPENDENT_AMBULATORY_CARE_PROVIDER_SITE_OTHER): Payer: Self-pay | Admitting: Internal Medicine

## 2020-12-11 ENCOUNTER — Other Ambulatory Visit: Payer: Self-pay

## 2020-12-11 ENCOUNTER — Ambulatory Visit (INDEPENDENT_AMBULATORY_CARE_PROVIDER_SITE_OTHER): Payer: Medicare Other | Admitting: Internal Medicine

## 2020-12-11 VITALS — BP 150/78 | HR 67 | Temp 97.7°F | Ht 59.0 in | Wt 131.9 lb

## 2020-12-11 DIAGNOSIS — K220 Achalasia of cardia: Secondary | ICD-10-CM | POA: Diagnosis not present

## 2020-12-11 DIAGNOSIS — Z85038 Personal history of other malignant neoplasm of large intestine: Secondary | ICD-10-CM

## 2020-12-11 DIAGNOSIS — I2510 Atherosclerotic heart disease of native coronary artery without angina pectoris: Secondary | ICD-10-CM

## 2020-12-11 DIAGNOSIS — K2210 Ulcer of esophagus without bleeding: Secondary | ICD-10-CM | POA: Diagnosis not present

## 2020-12-11 DIAGNOSIS — E1143 Type 2 diabetes mellitus with diabetic autonomic (poly)neuropathy: Secondary | ICD-10-CM

## 2020-12-11 DIAGNOSIS — K3184 Gastroparesis: Secondary | ICD-10-CM

## 2020-12-11 MED ORDER — DEXLANSOPRAZOLE 60 MG PO CPDR: 60.0000 mg | DELAYED_RELEASE_CAPSULE | Freq: Every day | ORAL | 3 refills | Status: DC

## 2020-12-11 MED ORDER — FAMOTIDINE 40 MG PO TABS: 40.0000 mg | ORAL_TABLET | Freq: Every day | ORAL | 3 refills | Status: DC

## 2020-12-11 NOTE — Progress Notes (Signed)
Presenting complaint;  Follow-up for GERD, achalasia gastroparesis and history of colon cancer.  Database and subjective:  Patient is 80 year old Caucasian female who is here for scheduled visit.  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  She is accompanied by her niece Estill Bamberg.  She was last seen by me on 01/04/2019.  Following that visit she was reevaluated by Dr. Milderd Meager of [facility].  She underwent esophagogastroduodenoscopy with Botox to pylorus as  on 01/06/2019.  She had high-resolution esophageal manometry on 01/16/2019 revealing Small sliding hiatal hernia GE junction was felt to be patulous.  She had food debris in the stomach. Premature and ineffective swallows absent bolus clearance as measured by impedance.   Manometry 01/16/2019: Impressions There is a small hiatal hernia present. The function of the lower esophageal sphincter is within normal limits. There are an elevated number of premature and ineffective swallows. Bolus clearance as measured by impedance is absent. 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 Would correlate with pH/Impedance testing if clinically indicated. Interpretation / Findings LESP: 13.8 mmHg, NL 13-43 mmHg IRP: 9.3 mmHg, NL < 13mHg ESOPHAGEAL MOTILITY: 50% Weak, 10% Failed, 60% Ineffective, 70% Premature, 40% Intact. UESP: 16.5 mmHg, NL 34-104 mmHg BOLUS CLEARANCE: 0% clearance by impedance DCI: measure @ 804.3 mmHg-cm-s, NL 680 571 7129 mmHg-cm-s  Dr. KDerrill Kayfelt that Botox therapy did not help and recommended no more injections. Patient does not feel well.  She has dysphagia almost every day.  She has regurgitation almost daily.  She spits up small amount of food.  She has nausea virtually every day.  She feels dexlansoprazole is providing some relief as far as heartburn is concerned.   She does not have every day.  Her appetite is fair.  She has not lost any weight since her visit almost 2 years ago.  Her bowels move daily or every other day.  She takes Senokot tablet maybe 3 times a week.  She denies melena or rectal bleeding.  She says her iron was discontinued by her primary care physician because her H&H was normal.  He has history of iron deficiency anemia. She complains of pain in her mouth.  She had multiple teeth removed.  She is taking pain medication every 4 hours.  She has a history of colon carcinoma.  She had a right hemicolectomy in January 2010. Last colonoscopy was in January 2017 with removal of 3 small tubular adenomas.   Current Medications: Outpatient Encounter Medications as of 12/11/2020  Medication Sig   acetaminophen (TYLENOL) 500 MG tablet Take 500 mg by mouth every 6 (six) hours as needed for mild pain.    ALPRAZolam (XANAX XR) 1 MG 24 hr tablet Take 0.5-1 mg by mouth as needed for sleep (Anxiety).    dexlansoprazole (DEXILANT) 60 MG capsule Take 1 capsule (60 mg total) by mouth daily before breakfast.   DULoxetine (CYMBALTA) 60 MG capsule Take 60 mg by mouth every morning.    famotidine (PEPCID) 40 MG tablet TAKE 1 TABLET BY MOUTH AT BEDTIME. (Patient taking differently: Take 40 mg by mouth daily.)   metFORMIN (GLUCOPHAGE) 500 MG tablet Take 500 mg by mouth daily.   Multiple Vitamin (MULTIVITAMIN WITH MINERALS) TABS tablet Take 1 tablet by mouth daily.   oxyCODONE (OXY IR/ROXICODONE) 5 MG immediate release tablet Take 1 tablet (5 mg total) by mouth every 4 (four) hours as  needed.   oxyCODONE-acetaminophen (PERCOCET) 7.5-325 MG tablet Take 1 tablet by mouth every 4 (four) hours as needed for severe pain.   polyvinyl alcohol (LIQUIFILM TEARS) 1.4 % ophthalmic solution Place 1 drop into both eyes 3 (three) times daily as needed for dry eyes.   pregabalin (LYRICA) 50 MG capsule Take 50 mg by mouth 3 (three) times daily as needed (nerve pain).   silodosin  (RAPAFLO) 8 MG CAPS capsule Take 8 mg by mouth at bedtime.    topiramate (TOPAMAX) 100 MG tablet Take 1 tablet (100 mg total) by mouth 2 (two) times daily.   Travoprost, BAK Free, (TRAVATAN) 0.004 % SOLN ophthalmic solution Place 1 drop into both eyes at bedtime.    No facility-administered encounter medications on file as of 12/11/2020.     Objective: Blood pressure (!) 150/78, pulse 67, temperature 97.7 F (36.5 C), temperature source Oral, height 4' 11" (1.499 m), weight 131 lb 14.4 oz (59.8 kg). Patient is alert and in no acute distress. She has hearing impairment. She is wearing a mask. Conjunctiva is pink. Sclera is nonicteric Oropharyngeal mucosa is normal. She has 3 remaining teeth at left lower jaw. No neck masses or thyromegaly noted. Cardiac exam with regular rhythm normal S1 and S2. No murmur or gallop noted. Lungs are clear to auscultation. Abdomen is symmetrical.  She has upper midline scar.  Bowel sounds are normal.  On palpation abdomen is soft with mild midepigastric tenderness.  No organomegaly or masses. No LE edema or clubbing noted.  Labs/studies Results:   CBC Latest Ref Rng & Units 12/04/2020 10/01/2018 09/23/2017  WBC 4.0 - 10.5 K/uL 7.4 8.0 5.4  Hemoglobin 12.0 - 15.0 g/dL 12.4 11.1(L) 11.5(L)  Hematocrit 36.0 - 46.0 % 40.9 35.9(L) 35.0  Platelets 150 - 400 K/uL 223 216 208    CMP Latest Ref Rng & Units 12/04/2020 10/01/2018 07/04/2016  Glucose 70 - 99 mg/dL 172(H) 182(H) 254(H)  BUN 8 - 23 mg/dL 7(L) 9 8  Creatinine 0.44 - 1.00 mg/dL 0.80 0.93 0.60  Sodium 135 - 145 mmol/L 140 135 134(L)  Potassium 3.5 - 5.1 mmol/L 3.4(L) 3.4(L) 2.9(L)  Chloride 98 - 111 mmol/L 102 104 97(L)  CO2 22 - 32 mmol/L 23 19(L) -  Calcium 8.9 - 10.3 mg/dL 9.1 9.5 -  Total Protein 6.5 - 8.1 g/dL - - 6.9  Total Bilirubin 0.3 - 1.2 mg/dL - - 0.8  Alkaline Phos 38 - 126 U/L - - 36(L)  AST 15 - 41 U/L - - 12(L)  ALT 14 - 54 U/L - - 13(L)    Hepatic Function Latest Ref Rng & Units  07/04/2016 12/25/2015 12/05/2014  Total Protein 6.5 - 8.1 g/dL 6.9 6.3 6.9  Albumin 3.5 - 5.0 g/dL 3.8 3.7 3.8  AST 15 - 41 U/L 12(L) 9(L) 14(L)  ALT 14 - 54 U/L 13(L) 9 13(L)  Alk Phosphatase 38 - 126 U/L 36(L) 38 39  Total Bilirubin 0.3 - 1.2 mg/dL 0.8 0.4 0.4  Bilirubin, Direct 0.1 - 0.5 mg/dL 0.2 - -    Esophageal manometry results as above.  Assessment:  #1.  Esophageal dysphagia.  He has chronic dysphagia.  She has a history of achalasia status post laparoscopic Heller's myotomy in February 2013.  High-resolution esophageal manometry in August 2021 revealed normal LES pressure but very poor or absent esophageal peristalsis which is felt To be the cause of her dysphagia.  She has a history of erosive reflux esophagitis(EGD April 2020) and therefore  could have developed esophageal stricture.  #2.  Chronic GERD.  Fair symptom control with long-acting PPI and famotidine.  Gastroparesis making management of GERD symptoms difficult.  #3.  Diabetic gastroparesis.  Pain medication is not helping.  She has received multiple Botox injections to pylorus which seem to help initially but not anymore.  Last injection was in July 2021 at [facility].  Dr. Derrill Kay recommended no further injections.  She has taken metoclopramide in the past which was discontinued because of tremors and history of falling episodes but she also has peripheral neuropathy.  #4.  History of colon carcinoma.  She is about 80 years old since her surgery.  Last colonoscopy was in January 2017 with removal of 3 tubular adenomas.  She is due for surveillance colonoscopy.   Plan:  New prescription given for dexlansoprazole 60 mg p.o. every morning 90 doses with 3 refills. New prescription also given for famotidine 40 mg daily at bedtime.  90 doses with 3 refills. Will proceed with esophagogastroduodenoscopy with possible esophageal dilation and surveillance colonoscopy in August 2022. Office visit in 6 months.

## 2020-12-11 NOTE — Patient Instructions (Addendum)
Esophagogastroduodenoscopy with possible esophageal dilation and surveillance colonoscopy to be scheduled in 6 to 7 weeks.  I got a papers I sent it to wound iron understanding that

## 2020-12-12 ENCOUNTER — Other Ambulatory Visit (INDEPENDENT_AMBULATORY_CARE_PROVIDER_SITE_OTHER): Payer: Self-pay

## 2020-12-12 ENCOUNTER — Telehealth (INDEPENDENT_AMBULATORY_CARE_PROVIDER_SITE_OTHER): Payer: Self-pay

## 2020-12-12 DIAGNOSIS — K220 Achalasia of cardia: Secondary | ICD-10-CM

## 2020-12-12 DIAGNOSIS — K2210 Ulcer of esophagus without bleeding: Secondary | ICD-10-CM

## 2020-12-12 DIAGNOSIS — Z85038 Personal history of other malignant neoplasm of large intestine: Secondary | ICD-10-CM

## 2020-12-12 MED ORDER — PEG 3350-KCL-NA BICARB-NACL 420 G PO SOLR
4000.0000 mL | ORAL | 0 refills | Status: DC
Start: 2020-12-12 — End: 2021-01-09

## 2020-12-12 NOTE — Telephone Encounter (Signed)
[name], CMA  

## 2020-12-14 ENCOUNTER — Encounter (INDEPENDENT_AMBULATORY_CARE_PROVIDER_SITE_OTHER): Payer: Self-pay

## 2020-12-17 ENCOUNTER — Encounter (INDEPENDENT_AMBULATORY_CARE_PROVIDER_SITE_OTHER): Payer: Self-pay

## 2020-12-24 ENCOUNTER — Encounter (HOSPITAL_COMMUNITY): Payer: Medicare Other

## 2021-01-02 NOTE — Patient Instructions (Signed)
[name]  01/02/2021     '@PREFPERIOPPHARMACY'$ @   Your procedure is scheduled on  01/09/2021.   Report to [name] at   0800 A.M.   Call this number if you have problems the morning of surgery:  [phone]   Remember:  Follow the diet and prep instructions given to you by the office.    Take these medicines the morning of surgery with A SIP OF WATER                    dexilant, cymbalta, pepecid, oxycodne (if needed), lyrica, topamax.     Do not wear jewelry, make-up or nail polish.  Do not wear lotions, powders, or perfumes, or deodorant.  Do not shave 48 hours prior to surgery.  Men may shave face and neck.  Do not bring valuables to the hospital.  [name] is not responsible for any belongings or valuables.  Contacts, dentures or bridgework may not be worn into surgery.  Leave your suitcase in the car.  After surgery it may be brought to your room.  For patients admitted to the hospital, discharge time will be determined by your treatment team.  Patients discharged the day of surgery will not be allowed to drive home and must have someone with them for 24 hours.    Special instructions:    DO NOT smoke tobacco or vape for 24 hours.  Please read over the following fact sheets that you were given. Anesthesia Post-op Instructions and Care and Recovery After Surgery   Upper Endoscopy, Adult, Care After This sheet gives you information about how to care for yourself after your procedure. Your health care provider may also give you more specific instructions. If you have problems or questions, contact your health careprovider. What can I expect after the procedure? After the procedure, it is common to have: A sore throat. Mild stomach pain or discomfort. Bloating. Nausea. Follow these instructions at home:  Follow instructions from your health care provider about what to eat or drink after your procedure. Return to your normal activities as told by  your health care provider. Ask your health care provider what activities are safe for you. Take over-the-counter and prescription medicines only as told by your health care provider. If you were given a sedative during the procedure, it can affect you for several hours. Do not drive or operate machinery until your health care provider says that it is safe. Keep all follow-up visits as told by your health care provider. This is important. Contact a health care provider if you have: A sore throat that lasts longer than one day. Trouble swallowing. Get help right away if: You vomit blood or your vomit looks like coffee grounds. You have: A fever. Bloody, black, or tarry stools. A severe sore throat or you cannot swallow. Difficulty breathing. Severe pain in your chest or abdomen. Summary After the procedure, it is common to have a sore throat, mild stomach discomfort, bloating, and nausea. If you were given a sedative during the procedure, it can affect you for several hours. Do not drive or operate machinery until your health care provider says that it is safe. Follow instructions from your health care provider about what to eat or drink after your procedure. Return to your normal activities as told by your health care provider. This information is not intended to replace advice given to you by your health care provider. Make sure you  discuss any questions you have with your healthcare provider. Document Revised: 05/24/2019 Document Reviewed: 10/26/2017 Elsevier Patient Education  2022 [place].  https://www.asge.org/home/for-patients/patient-information/understanding-eso-dilation-updated">  Esophageal Dilatation Esophageal dilatation, also called esophageal dilation, is a procedure to widen or open a blocked or narrowed part of the esophagus. The esophagus is the part of the body that moves food and liquid from the mouth to the stomach. You may need this procedure if: You have a  buildup of scar tissue in your esophagus that makes it difficult, painful, or impossible to swallow. This can be caused by gastroesophageal reflux disease (GERD). You have cancer of the esophagus. There is a problem with how food moves through your esophagus. In some cases, you may need this procedure repeated at a later time to dilatethe esophagus gradually. Tell a health care provider about: Any allergies you have. All medicines you are taking, including vitamins, herbs, eye drops, creams, and over-the-counter medicines. Any problems you or family members have had with anesthetic medicines. Any blood disorders you have. Any surgeries you have had. Any medical conditions you have. Any antibiotic medicines you are required to take before dental procedures. Whether you are pregnant or may be pregnant. What are the risks? Generally, this is a safe procedure. However, problems may occur, including: Bleeding due to a tear in the lining of the esophagus. A hole, or perforation, in the esophagus. What happens before the procedure? Ask your health care provider about: Changing or stopping your regular medicines. This is especially important if you are taking diabetes medicines or blood thinners. Taking medicines such as aspirin and ibuprofen. These medicines can thin your blood. Do not take these medicines unless your health care provider tells you to take them. Taking over-the-counter medicines, vitamins, herbs, and supplements. Follow instructions from your health care provider about eating or drinking restrictions. Plan to have a responsible adult take you home from the hospital or clinic. Plan to have a responsible adult care for you for the time you are told after you leave the hospital or clinic. This is important. What happens during the procedure? You may be given a medicine to help you relax (sedative). A numbing medicine may be sprayed into the back of your throat, or you may gargle the  medicine. Your health care provider may perform the dilatation using various surgical instruments, such as: Simple dilators. This instrument is carefully placed in the esophagus to stretch it. Guided wire bougies. This involves using an endoscope to insert a wire into the esophagus. A dilator is passed over this wire to enlarge the esophagus. Then the wire is removed. Balloon dilators. An endoscope with a small balloon is inserted into the esophagus. The balloon is inflated to stretch the esophagus and open it up. The procedure may vary among health care providers and hospitals. What can I expect after the procedure? Your blood pressure, heart rate, breathing rate, and blood oxygen level will be monitored until you leave the hospital or clinic. Your throat may feel slightly sore and numb. This will get better over time. You will not be allowed to eat or drink until your throat is no longer numb. When you are able to drink, urinate, and sit on the edge of the bed without nausea or dizziness, you may be able to return home. Follow these instructions at home: Take over-the-counter and prescription medicines only as told by your health care provider. If you were given a sedative during the procedure, it can affect you for several hours.  Do not drive or operate machinery until your health care provider says that it is safe. Plan to have a responsible adult care for you for the time you are told. This is important. Follow instructions from your health care provider about any eating or drinking restrictions. Do not use any products that contain nicotine or tobacco, such as cigarettes, e-cigarettes, and chewing tobacco. If you need help quitting, ask your health care provider. Keep all follow-up visits. This is important. Contact a health care provider if: You have a fever. You have pain that is not relieved by medicine. Get help right away if: You have chest pain. You have trouble breathing. You  have trouble swallowing. You vomit blood. You have black, tarry, or bloody stools. These symptoms may represent a serious problem that is an emergency. Do not wait to see if the symptoms will go away. Get medical help right away. Call your local emergency services (911 in the [place] Do not drive yourself to the hospital. Summary Esophageal dilatation, also called esophageal dilation, is a procedure to widen or open a blocked or narrowed part of the esophagus. Plan to have a responsible adult take you home from the hospital or clinic. For this procedure, a numbing medicine may be sprayed into the back of your throat, or you may gargle the medicine. Do not drive or operate machinery until your health care provider says that it is safe. This information is not intended to replace advice given to you by your health care provider. Make sure you discuss any questions you have with your healthcare provider. Document Revised: 10/12/2019 Document Reviewed: 10/12/2019 Elsevier Patient Education  [place].       Colonoscopy, Adult, Care After This sheet gives you information about how to care for yourself after your procedure. Your health care provider may also give you more specific instructions. If you have problems or questions, contact your health careprovider. What can I expect after the procedure? After the procedure, it is common to have: A small amount of blood in your stool for 24 hours after the procedure. Some gas. Mild cramping or bloating of your abdomen. Follow these instructions at home: Eating and drinking  Drink enough fluid to keep your urine pale yellow. Follow instructions from your health care provider about eating or drinking restrictions. Resume your normal diet as instructed by your health care provider. Avoid heavy or fried foods that are hard to digest.  Activity Rest as told by your health care provider. Avoid sitting for a long time without moving. Get up to  take short walks every 1-2 hours. This is important to improve blood flow and breathing. Ask for help if you feel weak or unsteady. Return to your normal activities as told by your health care provider. Ask your health care provider what activities are safe for you. Managing cramping and bloating  Try walking around when you have cramps or feel bloated. Apply heat to your abdomen as told by your health care provider. Use the heat source that your health care provider recommends, such as a moist heat pack or a heating pad. Place a towel between your skin and the heat source. Leave the heat on for 20-30 minutes. Remove the heat if your skin turns bright red. This is especially important if you are unable to feel pain, heat, or cold. You may have a greater risk of getting burned.  General instructions If you were given a sedative during the procedure, it can affect you  for several hours. Do not drive or operate machinery until your health care provider says that it is safe. For the first 24 hours after the procedure: Do not sign important documents. Do not drink alcohol. Do your regular daily activities at a slower pace than normal. Eat soft foods that are easy to digest. Take over-the-counter and prescription medicines only as told by your health care provider. Keep all follow-up visits as told by your health care provider. This is important. Contact a health care provider if: You have blood in your stool 2-3 days after the procedure. Get help right away if you have: More than a small spotting of blood in your stool. Large blood clots in your stool. Swelling of your abdomen. Nausea or vomiting. A fever. Increasing pain in your abdomen that is not relieved with medicine. Summary After the procedure, it is common to have a small amount of blood in your stool. You may also have mild cramping and bloating of your abdomen. If you were given a sedative during the procedure, it can affect you  for several hours. Do not drive or operate machinery until your health care provider says that it is safe. Get help right away if you have a lot of blood in your stool, nausea or vomiting, a fever, or increased pain in your abdomen. This information is not intended to replace advice given to you by your health care provider. Make sure you discuss any questions you have with your healthcare provider. Document Revised: 05/20/2019 Document Reviewed: 12/20/2018 Elsevier Patient Education  [place] After This sheet gives you information about how to care for yourself after your procedure. Your health care provider may also give you more specific instructions. If you have problems or questions, contact your health careprovider. What can I expect after the procedure? After the procedure, it is common to have: Tiredness. Forgetfulness about what happened after the procedure. Impaired judgment for important decisions. Nausea or vomiting. Some difficulty with balance. Follow these instructions at home: For the time period you were told by your health care provider:     Rest as needed. Do not participate in activities where you could fall or become injured. Do not drive or use machinery. Do not drink alcohol. Do not take sleeping pills or medicines that cause drowsiness. Do not make important decisions or sign legal documents. Do not take care of children on your own. Eating and drinking Follow the diet that is recommended by your health care provider. Drink enough fluid to keep your urine pale yellow. If you vomit: Drink water, juice, or soup when you can drink without vomiting. Make sure you have little or no nausea before eating solid foods. General instructions Have a responsible adult stay with you for the time you are told. It is important to have someone help care for you until you are awake and alert. Take over-the-counter and prescription  medicines only as told by your health care provider. If you have sleep apnea, surgery and certain medicines can increase your risk for breathing problems. Follow instructions from your health care provider about wearing your sleep device: Anytime you are sleeping, including during daytime naps. While taking prescription pain medicines, sleeping medicines, or medicines that make you drowsy. Avoid smoking. Keep all follow-up visits as told by your health care provider. This is important. Contact a health care provider if: You keep feeling nauseous or you keep vomiting. You feel light-headed. You are still sleepy or having  trouble with balance after 24 hours. You develop a rash. You have a fever. You have redness or swelling around the IV site. Get help right away if: You have trouble breathing. You have new-onset confusion at home. Summary For several hours after your procedure, you may feel tired. You may also be forgetful and have poor judgment. Have a responsible adult stay with you for the time you are told. It is important to have someone help care for you until you are awake and alert. Rest as told. Do not drive or operate machinery. Do not drink alcohol or take sleeping pills. Get help right away if you have trouble breathing, or if you suddenly become confused. This information is not intended to replace advice given to you by your health care provider. Make sure you discuss any questions you have with your healthcare provider. Document Revised: 02/09/2020 Document Reviewed: 04/28/2019 Elsevier Patient Education  2022 [employer].

## 2021-01-04 ENCOUNTER — Other Ambulatory Visit: Payer: Self-pay

## 2021-01-04 ENCOUNTER — Encounter (HOSPITAL_COMMUNITY): Payer: Self-pay

## 2021-01-04 ENCOUNTER — Encounter (HOSPITAL_COMMUNITY)
Admission: RE | Admit: 2021-01-04 | Discharge: 2021-01-04 | Disposition: A | Discharge status: Home / Self Care | Payer: Medicare Other | Source: Ambulatory Visit | Attending: Internal Medicine | Admitting: Internal Medicine

## 2021-01-09 ENCOUNTER — Ambulatory Visit (HOSPITAL_COMMUNITY)
Admission: RE | Admit: 2021-01-09 | Discharge: 2021-01-09 | Disposition: A | Discharge status: Home / Self Care | Payer: Medicare Other | Attending: Internal Medicine | Admitting: Internal Medicine

## 2021-01-09 ENCOUNTER — Ambulatory Visit (HOSPITAL_COMMUNITY): Payer: Medicare Other | Admitting: Anesthesiology

## 2021-01-09 ENCOUNTER — Encounter (HOSPITAL_COMMUNITY): Payer: Self-pay | Admitting: Internal Medicine

## 2021-01-09 ENCOUNTER — Encounter (HOSPITAL_COMMUNITY)
Admission: RE | Disposition: A | Discharge status: Home / Self Care | Payer: Self-pay | Source: Home / Self Care | Attending: Internal Medicine

## 2021-01-09 DIAGNOSIS — D123 Benign neoplasm of transverse colon: Secondary | ICD-10-CM | POA: Insufficient documentation

## 2021-01-09 DIAGNOSIS — K644 Residual hemorrhoidal skin tags: Secondary | ICD-10-CM | POA: Insufficient documentation

## 2021-01-09 DIAGNOSIS — Z79899 Other long term (current) drug therapy: Secondary | ICD-10-CM | POA: Diagnosis not present

## 2021-01-09 DIAGNOSIS — K2210 Ulcer of esophagus without bleeding: Secondary | ICD-10-CM

## 2021-01-09 DIAGNOSIS — K317 Polyp of stomach and duodenum: Secondary | ICD-10-CM | POA: Diagnosis not present

## 2021-01-09 DIAGNOSIS — Z87891 Personal history of nicotine dependence: Secondary | ICD-10-CM | POA: Insufficient documentation

## 2021-01-09 DIAGNOSIS — Z7984 Long term (current) use of oral hypoglycemic drugs: Secondary | ICD-10-CM | POA: Diagnosis not present

## 2021-01-09 DIAGNOSIS — Z85038 Personal history of other malignant neoplasm of large intestine: Secondary | ICD-10-CM | POA: Diagnosis not present

## 2021-01-09 DIAGNOSIS — R110 Nausea: Secondary | ICD-10-CM | POA: Insufficient documentation

## 2021-01-09 DIAGNOSIS — Z888 Allergy status to other drugs, medicaments and biological substances status: Secondary | ICD-10-CM | POA: Diagnosis not present

## 2021-01-09 DIAGNOSIS — Z1211 Encounter for screening for malignant neoplasm of colon: Secondary | ICD-10-CM | POA: Diagnosis not present

## 2021-01-09 DIAGNOSIS — Q438 Other specified congenital malformations of intestine: Secondary | ICD-10-CM | POA: Insufficient documentation

## 2021-01-09 DIAGNOSIS — Z08 Encounter for follow-up examination after completed treatment for malignant neoplasm: Secondary | ICD-10-CM | POA: Diagnosis not present

## 2021-01-09 DIAGNOSIS — Z880 Allergy status to penicillin: Secondary | ICD-10-CM | POA: Insufficient documentation

## 2021-01-09 DIAGNOSIS — K319 Disease of stomach and duodenum, unspecified: Secondary | ICD-10-CM | POA: Diagnosis not present

## 2021-01-09 DIAGNOSIS — Z8673 Personal history of transient ischemic attack (TIA), and cerebral infarction without residual deficits: Secondary | ICD-10-CM | POA: Insufficient documentation

## 2021-01-09 DIAGNOSIS — K2970 Gastritis, unspecified, without bleeding: Secondary | ICD-10-CM | POA: Diagnosis not present

## 2021-01-09 DIAGNOSIS — K229 Disease of esophagus, unspecified: Secondary | ICD-10-CM | POA: Diagnosis not present

## 2021-01-09 DIAGNOSIS — Z91013 Allergy to seafood: Secondary | ICD-10-CM | POA: Insufficient documentation

## 2021-01-09 DIAGNOSIS — K220 Achalasia of cardia: Secondary | ICD-10-CM

## 2021-01-09 DIAGNOSIS — Z9049 Acquired absence of other specified parts of digestive tract: Secondary | ICD-10-CM | POA: Insufficient documentation

## 2021-01-09 DIAGNOSIS — R1314 Dysphagia, pharyngoesophageal phase: Secondary | ICD-10-CM | POA: Insufficient documentation

## 2021-01-09 DIAGNOSIS — Z886 Allergy status to analgesic agent status: Secondary | ICD-10-CM | POA: Insufficient documentation

## 2021-01-09 DIAGNOSIS — Z9889 Other specified postprocedural states: Secondary | ICD-10-CM

## 2021-01-09 DIAGNOSIS — Z881 Allergy status to other antibiotic agents status: Secondary | ICD-10-CM | POA: Diagnosis not present

## 2021-01-09 DIAGNOSIS — Z885 Allergy status to narcotic agent status: Secondary | ICD-10-CM | POA: Diagnosis not present

## 2021-01-09 DIAGNOSIS — K5730 Diverticulosis of large intestine without perforation or abscess without bleeding: Secondary | ICD-10-CM | POA: Diagnosis not present

## 2021-01-09 HISTORY — PX: BIOPSY: SHX5522

## 2021-01-09 HISTORY — PX: POLYPECTOMY: SHX5525

## 2021-01-09 HISTORY — PX: ESOPHAGOGASTRODUODENOSCOPY (EGD) WITH PROPOFOL: SHX5813

## 2021-01-09 HISTORY — PX: COLONOSCOPY WITH PROPOFOL: SHX5780

## 2021-01-09 LAB — KOH PREP

## 2021-01-09 LAB — GLUCOSE, CAPILLARY
Glucose-Capillary: 176 mg/dL — ABNORMAL HIGH (ref 70–99)
Glucose-Capillary: 182 mg/dL — ABNORMAL HIGH (ref 70–99)

## 2021-01-09 LAB — HM COLONOSCOPY

## 2021-01-09 SURGERY — COLONOSCOPY WITH PROPOFOL
Anesthesia: General

## 2021-01-09 MED ORDER — PHENYLEPHRINE 40 MCG/ML (10ML) SYRINGE FOR IV PUSH (FOR BLOOD PRESSURE SUPPORT)
PREFILLED_SYRINGE | INTRAVENOUS | Status: AC
  Filled 2021-01-09: qty 10

## 2021-01-09 MED ORDER — LACTATED RINGERS IV SOLN
INTRAVENOUS | Status: DC
  Administered 2021-01-09: 1000 mL via INTRAVENOUS

## 2021-01-09 MED ORDER — EPHEDRINE 5 MG/ML INJ
INTRAVENOUS | Status: AC
  Filled 2021-01-09: qty 5

## 2021-01-09 MED ORDER — EPHEDRINE SULFATE-NACL 50-0.9 MG/10ML-% IV SOSY
PREFILLED_SYRINGE | INTRAVENOUS | Status: DC | PRN
  Administered 2021-01-09 (×3): 5 mg via INTRAVENOUS

## 2021-01-09 MED ORDER — NYSTATIN 100000 UNIT/ML MT SUSP: 5.0000 mL | Freq: Four times a day (QID) | OROMUCOSAL | 0 refills | Status: DC

## 2021-01-09 MED ORDER — PROPOFOL 10 MG/ML IV BOLUS
INTRAVENOUS | Status: DC | PRN
  Administered 2021-01-09: 70 mg via INTRAVENOUS
  Administered 2021-01-09: 40 mg via INTRAVENOUS

## 2021-01-09 MED ORDER — LIDOCAINE HCL (CARDIAC) PF 100 MG/5ML IV SOSY
PREFILLED_SYRINGE | INTRAVENOUS | Status: DC | PRN
  Administered 2021-01-09: 50 mg via INTRAVENOUS

## 2021-01-09 MED ORDER — STERILE WATER FOR IRRIGATION IR SOLN
Status: DC | PRN
  Administered 2021-01-09: 200 mL

## 2021-01-09 MED ORDER — PROPOFOL 500 MG/50ML IV EMUL
INTRAVENOUS | Status: DC | PRN
  Administered 2021-01-09: 100 ug/kg/min via INTRAVENOUS

## 2021-01-09 MED ORDER — PHENYLEPHRINE 40 MCG/ML (10ML) SYRINGE FOR IV PUSH (FOR BLOOD PRESSURE SUPPORT)
PREFILLED_SYRINGE | INTRAVENOUS | Status: DC | PRN
  Administered 2021-01-09: 80 ug via INTRAVENOUS

## 2021-01-09 NOTE — Discharge Instructions (Addendum)
No aspirin or NSAIDs for 24 hours. Resume usual medications and diet as before. Mycostatin suspension 500,000 units swish and swallow 4 times a day until prescription runs out. No driving for 24 hours. Physician will call with biopsy results.

## 2021-01-09 NOTE — Anesthesia Procedure Notes (Signed)
Date/Time: 01/09/2021 8:23 AM Performed by: Orlie Dakin, CRNA Pre-anesthesia Checklist: Patient identified, Emergency Drugs available, Suction available and Patient being monitored Patient Re-evaluated:Patient Re-evaluated prior to induction Oxygen Delivery Method: Nasal cannula Induction Type: IV induction Placement Confirmation: positive ETCO2

## 2021-01-09 NOTE — Transfer of Care (Signed)
Immediate Anesthesia Transfer of Care Note  Patient: [name]  Procedure(s) Performed: COLONOSCOPY WITH PROPOFOL ESOPHAGOGASTRODUODENOSCOPY (EGD) WITH PROPOFOL BIOPSY POLYPECTOMY  Patient Location: Short Stay  Anesthesia Type:General  Level of Consciousness: awake, alert  and oriented  Airway & Oxygen Therapy: Patient Spontanous Breathing  Post-op Assessment: Report given to RN and Post -op Vital signs reviewed and stable  Post vital signs: Reviewed and stable  Last Vitals:  Vitals Value Taken Time  BP    Temp    Pulse    Resp    SpO2      Last Pain:  Vitals:   01/09/21 0803  TempSrc: Oral  PainSc: 2       Patients Stated Pain Goal: 6 ([id number])  Complications: No notable events documented.

## 2021-01-09 NOTE — Op Note (Signed)
[facility] Patient Name: [name] Procedure Date: 01/09/2021 8:08 AM MRN: [id number] Date of Birth: 11-12-1940 Attending MD: Hildred Laser , MD CSN: [id number] Age: 80 Admit Type: Outpatient Procedure:                Upper GI endoscopy Indications:              Esophageal dysphagia, Nausea Providers:                Hildred Laser, MD, Lurline Del, RN, Kristine L.                            Risa Grill, Technician Referring MD:             Jordan Likes. Lavena Bullion, NP Medicines:                Propofol per Anesthesia Complications:            No immediate complications. Estimated Blood Loss:     Estimated blood loss was minimal. Procedure:                Pre-Anesthesia Assessment:                           - Prior to the procedure, a History and Physical                            was performed, and patient medications and                            allergies were reviewed. The patient's tolerance of                            previous anesthesia was also reviewed. The risks                            and benefits of the procedure and the sedation                            options and risks were discussed with the patient.                            All questions were answered, and informed consent                            was obtained. Prior Anticoagulants: The patient has                            taken no previous anticoagulant or antiplatelet                            agents. ASA Grade Assessment: III - A patient with                            severe systemic disease. After reviewing the risks  and benefits, the patient was deemed in                            satisfactory condition to undergo the procedure.                           After obtaining informed consent, the endoscope was                            passed under direct vision. Throughout the                            procedure, the patient's blood pressure, pulse, and                             oxygen saturations were monitored continuously. The                            [id number]-[id number]) scope was introduced through the                            mouth, and advanced to the second part of duodenum.                            The upper GI endoscopy was accomplished without                            difficulty. The patient tolerated the procedure                            well. Scope In: 8:22:47 AM Scope Out: 8:40:26 AM Total Procedure Duration: 0 hours 17 minutes 39 seconds  Findings:      The hypopharynx was normal.      The proximal esophagus was normal. Mid and distal esophagus somewhat       tortuous.      Patchy, white plaques were found in the distal esophagus. Cells for       cytology/ KOH were obtained by brushing.      Localized mucosal changes characterized by an abnormal appearance       suspicious for Barrett's were found in the distal esophagus. Biopsies       were taken with a cold forceps for histology. The pathology specimen was       placed into Bottle Number 2.      The Z-line was irregular and was found 33 cm from the incisors.      Diffuse mild inflammation characterized by congestion (edema) and       erythema was found in the gastric antrum and in the prepyloric region of       the stomach. Biopsies were taken with a cold forceps for histology. The       pathology specimen was placed into Bottle Number 1.      Evidence of a fundoplication was found in the gastric fundus. The wrap       appeared loose. Small polyp noted at cardia. Appeared to be hyperplastic       polyp. This polyp was  left alone.      The exam of the stomach was otherwise normal.      The duodenal bulb and second portion of the duodenum were normal. Impression:               - Normal hypopharynx.                           - Normal proximal esophagus.                           - Esophageal plaques were found, suspicious for                            candidiasis. Cells for  cytology obtained.                           - Abnormal (rule out Barrett's esophagus) mucosa in                            the esophagus. Biopsied.                           - Z-line irregular, 33 cm from the incisors.                           - Small hyperplastic appearing polyp in gastric                            fundus. Was left alone.                           - Gastritis. Biopsied.                           - A fundoplication was found. The wrap appears                            loose.                           - Normal duodenal bulb and second portion of the                            duodenum.                           Comment: Esophagus was not dilated. Moderate Sedation:      Per Anesthesia Care Recommendation:           - Patient has a contact number available for                            emergencies. The signs and symptoms of potential                            delayed complications were discussed with the  patient. Return to normal activities tomorrow.                            Written discharge instructions were provided to the                            patient.                           - Resume previous diet today.                           - Continue present medications.                           - Nystatin suspension 400,000 units PO QID for 10                            days.                           - Await pathology results. Procedure Code(s):        --- Professional ---                           [phone], Esophagogastroduodenoscopy, flexible,                            transoral; with biopsy, single or multiple Diagnosis Code(s):        --- Professional ---                           K22.9, Disease of esophagus, unspecified                           K22.8, Other specified diseases of esophagus                           K29.70, Gastritis, unspecified, without bleeding                           Z98.890, Other specified postprocedural  states                           R13.14, Dysphagia, pharyngoesophageal phase                           R11.0, Nausea CPT copyright 2019 American Medical Association. All rights reserved. The codes documented in this report are preliminary and upon coder review may  be revised to meet current compliance requirements. Hildred Laser, MD Hildred Laser, MD 01/09/2021 9:17:30 AM This report has been signed electronically. Number of Addenda: 0

## 2021-01-09 NOTE — Anesthesia Preprocedure Evaluation (Addendum)
Anesthesia Evaluation  Patient identified by MRN, date of birth, ID band Patient awake    Reviewed: Allergy & Precautions, NPO status , Patient's Chart, lab work & pertinent test results  History of Anesthesia Complications (+) PONV, Family history of anesthesia reaction and history of anesthetic complications  Airway Mallampati: II  TM Distance: >3 FB Neck ROM: Full   Comment: Chronic neck pain ACDF Dental  (+) Dental Advisory Given, Missing   Pulmonary asthma , pneumonia, former smoker,    Pulmonary exam normal breath sounds clear to auscultation       Cardiovascular Exercise Tolerance: Good + angina + CAD  Normal cardiovascular exam Rhythm:Regular Rate:Normal  10/24/20 stress test There was no ST segment deviation noted during stress. The left ventricular ejection fraction is normal (55-65%). Nuclear stress EF: 62%. Defect 1: There is a medium defect of moderate severity present in the basal inferolateral, mid inferolateral, apical inferior and apical lateral location. Findings consistent with prior myocardial infarction vs artifact This is a low risk study.   1. Fixed inferolateral perfusion defect with normal wall motion in this area, consistent with artifact.  There is significant extracardiac radiotracer activity adjacent to inferolateral wall, likely causing artifact 2. Low risk study     Neuro/Psych  Headaches, PSYCHIATRIC DISORDERS Anxiety Dementia Post concussion syndrome  TIA Neuromuscular disease    GI/Hepatic hiatal hernia, PUD, GERD  Medicated,  Endo/Other  diabetes, Well Controlled, Type 2, Oral Hypoglycemic Agents  Renal/GU Renal InsufficiencyRenal disease     Musculoskeletal  (+) Arthritis  (chronic back pain, neck pain, ACDF), Fibromyalgia -  Abdominal   Peds  Hematology  (+) anemia ,   Anesthesia Other Findings   Reproductive/Obstetrics                             Anesthesia Physical Anesthesia Plan  ASA: 3  Anesthesia Plan: General   Post-op Pain Management:    Induction: Intravenous  PONV Risk Score and Plan: Propofol infusion  Airway Management Planned: Nasal Cannula and Natural Airway  Additional Equipment:   Intra-op Plan:   Post-operative Plan:   Informed Consent: I have reviewed the patients History and Physical, chart, labs and discussed the procedure including the risks, benefits and alternatives for the proposed anesthesia with the patient or authorized representative who has indicated his/her understanding and acceptance.     Dental advisory given  Plan Discussed with: CRNA and Surgeon  Anesthesia Plan Comments:        Anesthesia Quick Evaluation

## 2021-01-09 NOTE — H&P (Signed)
[name] is an 80 y.o. female.   Chief Complaint: Patient is here for esophagogastroduodenoscopy, possible esophageal dilation and colonoscopy. HPI: Patient is 80 year old Caucasian female who has a history of achalasia as well as gastroparesis history of colon carcinoma and adenomas who is here for for endoscopic evaluation.  She states her swallowing difficulty has worsened.  She has daily nausea.  Heartburn is well controlled with therapy.  Lately she has not had any vomiting spell.  Her appetite is is fair.  She is also undergoing surveillance colonoscopy.  She is status post right hemicolectomy in January 2010.  Last colonoscopy was in January 2017 with removal of 3 small tubular adenomas.  She denies change in bowel habits or rectal bleeding. She does not take anticoagulants or aspirin. Family history is negative for CRC.  Past Medical History:  Diagnosis Date   Anemia    Anxiety    Arthritis    "all over" (04/19/2013)   Asthma    Bleeding stomach ulcer 02/08/1979   Bloating    Chronic back pain    Chronic headaches    Chronic heartburn    Chronic neck pain    Chronic pain    Colon cancer (HCC)    DDD (degenerative disc disease), lumbar    Diabetic peripheral neuropathy ([place])    "in my feet" (04/19/2013)   Dizziness    Dysphagia    Family history of anesthesia complication    "daughter has PONV too" (04/19/2013)   Fibromyalgia    Gastroesophageal reflux    Gastroparesis    Glaucoma, bilateral    Hiatal hernia    "had it before; had OR; got it again" (04/19/2013)   History of blood transfusion 02/08/1979   "w/bleeding stomach ulcer" (04/19/2013)   History of kidney stones    Migraine    "take RX for it qd" (04/19/2013)   Nausea    Pneumonia 04/09/2012   PONV (postoperative nausea and vomiting)    Renal insufficiency    TIA (transient ischemic attack)    "3-4 before starting RX; none since" (04/19/2013)   Type II diabetes mellitus ([place])    Walking pneumonia ~  1966    Past Surgical History:  Procedure Laterality Date   ABDOMINAL ADHESION SURGERY  ~ 2012   "repaired wrap where they did hiatal hernia OR too" 911/04/2013)   ANTERIOR CERVICAL DISCECTOMY  ~ 2009   "only cleaned out arthritis and spurs" (04/19/2013)   CATARACT EXTRACTION W/ INTRAOCULAR LENS IMPLANT Left 04/13/2013   CHOLECYSTECTOMY  1980's   COLON SURGERY     COLONOSCOPY  10/11/2010   COLONOSCOPY  11/23/2009   COLONOSCOPY  07/28/2008   W/SNARE   COLONOSCOPY  06/28/07   COLONOSCOPY  05/10/07   W/POLYP   COLONOSCOPY  12/28/00   COLONOSCOPY N/A 06/29/2015   Procedure: COLONOSCOPY;  Surgeon: Rogene Houston, MD;  [facility];  Service: Endoscopy;  Laterality: N/A;  135   COLONOSCOPY WITH ESOPHAGOGASTRODUODENOSCOPY (EGD) N/A 05/13/2013   Procedure: COLONOSCOPY WITH ESOPHAGOGASTRODUODENOSCOPY (EGD);  Surgeon: Rogene Houston, MD;  [facility]: [facility];  Service: Endoscopy;  Laterality: N/A;  855   ESOPHAGOGASTRODUODENOSCOPY (EGD) WITH PROPOFOL N/A 10/06/2018   Procedure: ESOPHAGOGASTRODUODENOSCOPY (EGD) WITH PROPOFOL;  Surgeon: Rogene Houston, MD;  [facility];  Service: Endoscopy;  Laterality: N/A;   HEMICOLECTOMY  2010   ZIEGLER   HIATAL HERNIA REPAIR  1970's   LEFT HEART CATHETERIZATION WITH CORONARY ANGIOGRAM N/A 04/19/2013   Procedure: LEFT  HEART CATHETERIZATION WITH CORONARY ANGIOGRAM;  Surgeon: Peter M Martinique, MD;  Location: [facility] CATH LAB;  Service: Cardiovascular;  Laterality: N/A;   POLYPECTOMY  10/06/2018   Procedure: POLYPECTOMY;  Surgeon: Rogene Houston, MD;  Location: [facility];  Service: Endoscopy;;  gastric   [place]   "had a broken neck" (04/19/2013)   TONSILLECTOMY  ~ [place] Bilateral 12/04/2020   Procedure: BILATERAL TEMPOROMANDIBULAR JOINT ARTHROCENTESIS; DENTAL EXTRACTION TEETH #3,6,7,8,9,10,11,12,19,23,24,25,26,32 WITH ALVEOLOPLASTY;  Surgeon: Diona Browner, DMD;  Location: [place];   Service: Oral Surgery;  Laterality: Bilateral;   UPPER GASTROINTESTINAL ENDOSCOPY  10/11/2010   EGD ED   UPPER GASTROINTESTINAL ENDOSCOPY  11/23/2009   UPPER GASTROINTESTINAL ENDOSCOPY  05/10/07   EGD ED   UPPER GASTROINTESTINAL ENDOSCOPY  08/11/01   EGD ED   VAGINAL HYSTERECTOMY      Family History  Problem Relation Age of Onset   Heart disease Mother    Diabetes Mother    Dementia Father    Healthy Sister    Diabetes Brother    Kidney cancer Brother    Diabetes Brother    Neuropathy Brother    Diabetes Daughter    Diabetes Daughter    Diabetes Son    Social History:  reports that she quit smoking about 29 years ago. Her smoking use included cigarettes. She has a 22.50 pack-year smoking history. She has never used smokeless tobacco. She reports that she does not drink alcohol and does not use drugs.  Allergies:  Allergies  Allergen Reactions   Botox [Onabotulinumtoxina]     Patient states that it became loose in her stomach , and it caused multiple problems.   Morphine Anaphylaxis and Other (See Comments)    "it will kill me" "made me stop breathing"   Aspirin Other (See Comments)    Gi bleed    Cefuroxime Axetil Nausea And Vomiting   Codeine Nausea And Vomiting   Diltiazem Nausea And Vomiting   Dronabinol Nausea And Vomiting and Palpitations   Shellfish Allergy Other (See Comments)    Blood sugar drops   Ondansetron Hives   Penicillins Rash    Did it involve swelling of the face/tongue/throat, SOB, or low BP? No Did it involve sudden or severe rash/hives, skin peeling, or any reaction on the inside of your mouth or nose? Yes Did you need to seek medical attention at a hospital or doctor's office? Yes When did it last happen?  Over 10 years  If all above answers are "NO", may proceed with cephalosporin use.     Medications Prior to Admission  Medication Sig Dispense Refill   acetaminophen (TYLENOL) 500 MG tablet Take 500 mg by mouth every 6 (six) hours as  needed for mild pain.      ALPRAZolam (XANAX XR) 1 MG 24 hr tablet Take 0.5-1 mg by mouth as needed for sleep (Anxiety).      dexlansoprazole (DEXILANT) 60 MG capsule Take 1 capsule (60 mg total) by mouth daily before breakfast. 90 capsule 3   DULoxetine (CYMBALTA) 60 MG capsule Take 60 mg by mouth every morning.      famotidine (PEPCID) 40 MG tablet Take 1 tablet (40 mg total) by mouth at bedtime. 90 tablet 3   metFORMIN (GLUCOPHAGE) 500 MG tablet Take 500 mg by mouth daily.     Multiple Vitamin (MULTIVITAMIN WITH MINERALS) TABS tablet Take 1 tablet by mouth daily.     oxyCODONE-acetaminophen (PERCOCET) 7.5-325 MG  tablet Take 1 tablet by mouth every 4 (four) hours as needed for severe pain.     polyethylene glycol-electrolytes (TRILYTE) 420 g solution Take 4,000 mLs by mouth as directed. 4000 mL 0   polyvinyl alcohol (LIQUIFILM TEARS) 1.4 % ophthalmic solution Place 1 drop into both eyes 3 (three) times daily as needed for dry eyes.     pregabalin (LYRICA) 50 MG capsule Take 50 mg by mouth 3 (three) times daily as needed (nerve pain).     silodosin (RAPAFLO) 8 MG CAPS capsule Take 8 mg by mouth at bedtime.      topiramate (TOPAMAX) 100 MG tablet Take 1 tablet (100 mg total) by mouth 2 (two) times daily. 60 tablet 5   Travoprost, BAK Free, (TRAVATAN) 0.004 % SOLN ophthalmic solution Place 1 drop into both eyes at bedtime.       Results for orders placed or performed during the hospital encounter of 01/09/21 (from the past 48 hour(s))  Glucose, capillary     Status: Abnormal   Collection Time: 01/09/21  7:59 AM  Result Value Ref Range   Glucose-Capillary 176 (H) 70 - 99 mg/dL    Comment: Glucose reference range applies only to samples taken after fasting for at least 8 hours.   No results found.  Review of Systems  Blood pressure (!) 169/73, pulse (!) 56, temperature 98.2 F (36.8 C), temperature source Oral, resp. rate 18, SpO2 100 %. Physical Exam HENT:     Mouth/Throat:     Mouth:  Mucous membranes are moist.     Pharynx: Oropharynx is clear.  Eyes:     General: No scleral icterus.    Conjunctiva/sclera: Conjunctivae normal.  Cardiovascular:     Rate and Rhythm: Normal rate and regular rhythm.     Heart sounds: Normal heart sounds. No murmur heard. Pulmonary:     Effort: Pulmonary effort is normal.     Breath sounds: Normal breath sounds.  Abdominal:     Comments: Abdomen is symmetrical with upper midline and right subcostal scars.  On palpation abdomen is soft and nontender with organomegaly or masses.  Musculoskeletal:        General: No swelling.     Cervical back: Neck supple.  Lymphadenopathy:     Cervical: No cervical adenopathy.  Skin:    General: Skin is warm and dry.  Neurological:     Mental Status: She is alert.     Assessment/Plan  Esophageal dysphagia and chronic nausea. History of colon carcinoma. Diagnostic esophagogastroduodenoscopy with possible esophageal dilation and surveillance colonoscopy.  Hildred Laser, MD 01/09/2021, 8:12 AM

## 2021-01-09 NOTE — Op Note (Signed)
[facility] Patient Name: [name] Procedure Date: 01/09/2021 8:43 AM MRN: [id number] Date of Birth: 1940-11-06 Attending MD: Hildred Laser , MD CSN: [id number] Age: 80 Admit Type: Outpatient Procedure:                Colonoscopy Indications:              High risk colon cancer surveillance: Personal                            history of colon cancer Providers:                Hildred Laser, MD, Lurline Del, RN, Kristine L.                            Risa Grill, Technician Referring MD:             Jordan Likes. Lavena Bullion, NP Medicines:                Propofol per Anesthesia Complications:            No immediate complications. Estimated Blood Loss:     Estimated blood loss was minimal. Procedure:                Pre-Anesthesia Assessment:                           - Prior to the procedure, a History and Physical                            was performed, and patient medications and                            allergies were reviewed. The patient's tolerance of                            previous anesthesia was also reviewed. The risks                            and benefits of the procedure and the sedation                            options and risks were discussed with the patient.                            All questions were answered, and informed consent                            was obtained. Prior Anticoagulants: The patient has                            taken no previous anticoagulant or antiplatelet                            agents. ASA Grade Assessment: III - A patient with  severe systemic disease. After reviewing the risks                            and benefits, the patient was deemed in                            satisfactory condition to undergo the procedure.                           After obtaining informed consent, the colonoscope                            was passed under direct vision. Throughout the                             procedure, the patient's blood pressure, pulse, and                            oxygen saturations were monitored continuously. The                            [id number]-[id number] scope was introduced through                            the anus and advanced to the the ileocolonic                            anastomosis. The colonoscopy was performed without                            difficulty. The patient tolerated the procedure                            well. The quality of the bowel preparation was                            good. The terminal ileum and the rectum were                            photographed. The terminal ileum and the rectum                            were photographed. Scope In: 8:46:23 AM Scope Out: 9:04:04 AM Scope Withdrawal Time: 0 hours 14 minutes 6 seconds  Total Procedure Duration: 0 hours 17 minutes 41 seconds  Findings:      Skin tags were found on perianal exam.      The terminal ileum appeared normal.      Scattered diverticula were found in the transverse colon.      Two flat polyps were found in the transverse colon. The polyps were 4 to       6 mm in size. These polyps were removed with a cold snare. Resection and       retrieval were complete. The pathology specimen was placed into Bottle  Number 3.      The exam was otherwise normal throughout the examined colon.      External hemorrhoids were found during retroflexion. The hemorrhoids       were medium-sized. Impression:               - Perianal skin tags found on perianal exam.                           - The examined portion of the ileum was normal.                           - Diverticulosis in the transverse colon.                           - Two 4 to 6 mm polyps, removed with a cold snare.                            Resected and retrieved.                           - External hemorrhoids. Moderate Sedation:      Per Anesthesia Care Recommendation:           - Patient has a contact  number available for                            emergencies. The signs and symptoms of potential                            delayed complications were discussed with the                            patient. Return to normal activities tomorrow.                            Written discharge instructions were provided to the                            patient.                           - Resume previous diet today.                           - Continue present medications.                           - No aspirin, ibuprofen, naproxen, or other                            non-steroidal anti-inflammatory drugs for 1 day.                           - Await pathology results.                           -  Repeat colonoscopy in 5 years for surveillance. Procedure Code(s):        --- Professional ---                           [phone], Colonoscopy, flexible; with removal of                            tumor(s), polyp(s), or other lesion(s) by snare                            technique Diagnosis Code(s):        --- Professional ---                           K64.4, Residual hemorrhoidal skin tags                           Z85.038, Personal history of other malignant                            neoplasm of large intestine                           K63.5, Polyp of colon                           K57.30, Diverticulosis of large intestine without                            perforation or abscess without bleeding CPT copyright 2019 [employer]. All rights reserved. The codes documented in this report are preliminary and upon coder review may  be revised to meet current compliance requirements. Hildred Laser, MD Hildred Laser, MD 01/09/2021 9:27:02 AM This report has been signed electronically. Number of Addenda: 0

## 2021-01-09 NOTE — Anesthesia Postprocedure Evaluation (Signed)
Anesthesia Post Note  Patient: [name]  Procedure(s) Performed: COLONOSCOPY WITH PROPOFOL ESOPHAGOGASTRODUODENOSCOPY (EGD) WITH PROPOFOL BIOPSY POLYPECTOMY  Patient location during evaluation: Phase II Anesthesia Type: General Level of consciousness: awake and alert and oriented Pain management: pain level controlled Vital Signs Assessment: post-procedure vital signs reviewed and stable Respiratory status: spontaneous breathing and respiratory function stable Cardiovascular status: blood pressure returned to baseline and stable Postop Assessment: no apparent nausea or vomiting Anesthetic complications: no   No notable events documented.   Last Vitals:  Vitals:   01/09/21 0803 01/09/21 0908  BP: (!) 169/73 (!) 104/55  Pulse: (!) 56 (!) 59  Resp: 18 (!) 100  Temp: 36.8 C 36.7 C  SpO2: 100% 100%    Last Pain:  Vitals:   01/09/21 0908  TempSrc: Oral  PainSc: 0-No pain                 Axie Hayne C Elisabet Gutzmer

## 2021-01-10 LAB — SURGICAL PATHOLOGY

## 2021-01-15 ENCOUNTER — Encounter (INDEPENDENT_AMBULATORY_CARE_PROVIDER_SITE_OTHER): Payer: Self-pay | Admitting: *Deleted

## 2021-01-17 ENCOUNTER — Encounter (HOSPITAL_COMMUNITY): Payer: Self-pay | Admitting: Internal Medicine

## 2021-02-25 ENCOUNTER — Telehealth (INDEPENDENT_AMBULATORY_CARE_PROVIDER_SITE_OTHER): Payer: Self-pay | Admitting: *Deleted

## 2021-02-25 NOTE — Telephone Encounter (Signed)
[name] called and states she had a procedure a few weeks ago and some samples were taken. She states she did not get results. I looked it up and let [name] know that dr Laural Golden gave results to her daughter [name] and I read her the results. I sent a copy of the message to [name] because [name] states she never heard from her.   Per Dr Laural Golden: Biopsy results reviewed with patient's daughter [name] Biopsy from distal esophagus negative for Barrett's.  Gastric biopsy negative for H. pylori and both polyps are tubular adenomas. Patient's daughters says that she has been bleeding intermittently from her hemorrhoids and interested in surgical treatment. Her hemorrhoids appear mainly to be external.  Not a candidate for banding. Will ask Ms. Tammy Kaufman, NP for her input.   Report to PCP. Next colonoscopy not due until 5 years from now.  [name] states about 2 days ago she started having a burning pain in esophagus and in between breast.  Does not know how to describe the pain. Nothing makes it worse and sometimes it doesn't last long and sometimes it last for awhile. States mylanta helps it sometimes and sometimes not. Advised [name] that dr Laural Golden is out of office today and she should call her pcp. She states she will call pcp today for recommendations.

## 2021-02-25 NOTE — Telephone Encounter (Signed)
Pt called and states she did not received biopsy results. I looked in her chart and let pt know that dr Laural Golden spoke with her daughter [name] and I read her the results below. I'm not sure that [name] received a copy of this message so I will resend now.  Pt states she has not heard back.   Per Dr Laural Golden: Biopsy results reviewed with patient's daughter [name] Biopsy from distal esophagus negative for Barrett's.  Gastric biopsy negative for H. pylori and both polyps are tubular adenomas. Patient's daughters says that she has been bleeding intermittently from her hemorrhoids and interested in surgical treatment. Her hemorrhoids appear mainly to be external.  Not a candidate for banding. Will ask Ms. Tammy Kaufman, NP for her input.   Report to PCP. Next colonoscopy not due until 5 years from now.

## 2021-02-26 ENCOUNTER — Encounter: Payer: Self-pay | Admitting: Internal Medicine

## 2021-02-26 NOTE — Telephone Encounter (Signed)
Left message to return call 

## 2021-02-26 NOTE — Telephone Encounter (Signed)
[name], I am happy to see her in the office. I can do an anoscopy at time of visit and see if any obvious internal hemorrhoids amenable to banding. Sometimes smaller ones are not as noticeable during colonoscopy. With bleeding, she clinically sounds to internal component. Other differentials including anal fissure, but without pain, this would be less likely.   [name]: please arrange office visit for hemorrhoid banding.   [name]: please let her know I can see her in the office, and our office will arrange.

## 2021-02-26 NOTE — Telephone Encounter (Signed)
Pt notified of appt with anna boone on 1/31 and that she was added to cancellation list. Pt verbalized understanding. Pt also states she was unable to get in touch with pcp about the pain in esophagus and chest she was having. States she is not having that pain today. Advised pt to go to ED if chest pain returns. Per dr Laural Golden could be heart related since mylanta does not always help and pt verbalzied understanding if pain returns she should go to ED.

## 2021-06-18 ENCOUNTER — Ambulatory Visit (INDEPENDENT_AMBULATORY_CARE_PROVIDER_SITE_OTHER): Payer: Medicare Other | Admitting: Internal Medicine

## 2021-06-18 ENCOUNTER — Encounter (INDEPENDENT_AMBULATORY_CARE_PROVIDER_SITE_OTHER): Payer: Self-pay | Admitting: Internal Medicine

## 2021-06-18 ENCOUNTER — Other Ambulatory Visit: Payer: Self-pay

## 2021-06-18 VITALS — BP 142/83 | HR 60 | Temp 97.8°F | Ht 59.0 in | Wt 137.8 lb

## 2021-06-18 DIAGNOSIS — E1143 Type 2 diabetes mellitus with diabetic autonomic (poly)neuropathy: Secondary | ICD-10-CM

## 2021-06-18 DIAGNOSIS — K3184 Gastroparesis: Secondary | ICD-10-CM

## 2021-06-18 DIAGNOSIS — K219 Gastro-esophageal reflux disease without esophagitis: Secondary | ICD-10-CM

## 2021-06-18 MED ORDER — LOPERAMIDE HCL 2 MG PO CAPS: 2.0000 mg | ORAL_CAPSULE | Freq: Every day | ORAL | Status: DC

## 2021-06-18 NOTE — Patient Instructions (Signed)
Take Imodium/loperamide 2 mg by mouth daily with breakfast.  If this medication makes you constipated then drop dose to half a tablet or 1 mg with breakfast daily. Notify if swallowing difficulty worsens. Will request copy of recent blood work from Dr. Toya Smothers office before any tests ordered.

## 2021-06-18 NOTE — Progress Notes (Signed)
Presenting complaint;  Follow for chronic GERD and gastroparesis. History of dysphagia and colon cancer.  Database and subjective:  Patient is 81 year old Caucasian female who is here for scheduled visit accompanied by her daughter Karna Christmas.  She was last seen on 12/11/2020. She has multiple GI problems which include history of colon carcinoma, status post right hemicolectomy March 2010 chronic GERD, history of achalasia status post laparoscopic Heller's myotomy in February 2013, history of iron deficiency anemia possibly due to impaired iron iron absorption. She also has a history of H. pylori gastritis for which she was treated and December 2014. Last EGD and colonoscopy were in August 2022. EGD revealed Candida esophagitis but no evidence of spastic segment or stricture.  Biopsy from distal and ruled out Barrett's esophagus.  Gastric biopsy was negative for H. pylori. 2 small polyps were removed at colonoscopy and these were tubular adenomas.  Patient remains with frequent nausea but rare vomiting.  She has daily heartburn in spite of taking dexlansoprazole in the morning and famotidine at bedtime.  She eats small meals.  She has gained 6 pounds since her last visit 6 months ago.  She remains with dysphagia but has not had an episode of food impaction recently.  She recently has not been treated for pneumonia or bronchitis.  She continues to complain of bloating.  She remains with loose stools.  Her bowels do not move daily when she does her loose and she may have 3-4.  Today she has had 3 stools.  She has noted blood on the tissue but no frank bleeding.  She has hemorrhoids for which she underwent ultra therapy several years ago and she is scheduled to undergo hemorrhoidal banding by Ms. Roseanne Kaufman, NP on 07/09/2021. She takes pain medication once or twice a month and no more. Daughter says she gets regular blood work at Dr. Toya Smothers office and her last H&H was normal.   Current  Medications: Outpatient Encounter Medications as of 06/18/2021  Medication Sig   acetaminophen (TYLENOL) 500 MG tablet Take 500 mg by mouth every 6 (six) hours as needed for mild pain.    ALPRAZolam (XANAX XR) 1 MG 24 hr tablet Take 0.5-1 mg by mouth as needed for sleep (Anxiety).    dexlansoprazole (DEXILANT) 60 MG capsule Take 1 capsule (60 mg total) by mouth daily before breakfast.   DULoxetine (CYMBALTA) 60 MG capsule Take 60 mg by mouth every morning.    famotidine (PEPCID) 40 MG tablet Take 1 tablet (40 mg total) by mouth at bedtime.   lisinopril (ZESTRIL) 2.5 MG tablet Take 2.5 mg by mouth daily.   metFORMIN (GLUCOPHAGE) 500 MG tablet Take 500 mg by mouth daily.   Multiple Vitamin (MULTIVITAMIN WITH MINERALS) TABS tablet Take 1 tablet by mouth daily.   oxyCODONE-acetaminophen (PERCOCET) 7.5-325 MG tablet Take 1 tablet by mouth every 4 (four) hours as needed for severe pain.   polyvinyl alcohol (LIQUIFILM TEARS) 1.4 % ophthalmic solution Place 1 drop into both eyes 3 (three) times daily as needed for dry eyes.   silodosin (RAPAFLO) 8 MG CAPS capsule Take 8 mg by mouth at bedtime.    topiramate (TOPAMAX) 100 MG tablet Take 1 tablet (100 mg total) by mouth 2 (two) times daily.   Travoprost, BAK Free, (TRAVATAN) 0.004 % SOLN ophthalmic solution Place 1 drop into both eyes at bedtime.    [DISCONTINUED] nystatin (MYCOSTATIN) 100000 UNIT/ML suspension Take 5 mLs (500,000 Units total) by mouth 4 (four) times daily. (Patient not taking: Reported  on 06/18/2021)   [DISCONTINUED] pregabalin (LYRICA) 50 MG capsule Take 50 mg by mouth 3 (three) times daily as needed (nerve pain). (Patient not taking: Reported on 06/18/2021)   No facility-administered encounter medications on file as of 06/18/2021.     Objective: Blood pressure (!) 142/83, pulse 60, temperature 97.8 F (36.6 C), temperature source Oral, height $[id number]'4\' 11"'[id number]$  (1.499 m), weight 137 lb 12.8 oz (62.5 kg). Patient is alert and in no acute  distress. She has severe hearing impairment. Conjunctiva is pink. Sclera is nonicteric Oropharyngeal mucosa is normal. No neck masses or thyromegaly noted. Cardiac exam with regular rhythm normal S1 and S2. No murmur or gallop noted. Lungs are clear to auscultation. Abdomen is symmetrical.  She has upper midline and right subcostal scars.  On palpation abdomen is soft and nontender with organomegaly or masses. No LE edema or clubbing noted.  Labs/studies Results:   CBC Latest Ref Rng & Units 12/04/2020 10/01/2018 09/23/2017  WBC 4.0 - 10.5 K/uL 7.4 8.0 5.4  Hemoglobin 12.0 - 15.0 g/dL 12.4 11.1(L) 11.5(L)  Hematocrit 36.0 - 46.0 % 40.9 35.9(L) 35.0  Platelets 150 - 400 K/uL 223 216 208    CMP Latest Ref Rng & Units 12/04/2020 10/01/2018 07/04/2016  Glucose 70 - 99 mg/dL 172(H) 182(H) 254(H)  BUN 8 - 23 mg/dL 7(L) 9 8  Creatinine 0.44 - 1.00 mg/dL 0.80 0.93 0.60  Sodium 135 - 145 mmol/L 140 135 134(L)  Potassium 3.5 - 5.1 mmol/L 3.4(L) 3.4(L) 2.9(L)  Chloride 98 - 111 mmol/L 102 104 97(L)  CO2 22 - 32 mmol/L 23 19(L) -  Calcium 8.9 - 10.3 mg/dL 9.1 9.5 -  Total Protein 6.5 - 8.1 g/dL - - 6.9  Total Bilirubin 0.3 - 1.2 mg/dL - - 0.8  Alkaline Phos 38 - 126 U/L - - 36(L)  AST 15 - 41 U/L - - 12(L)  ALT 14 - 54 U/L - - 13(L)    Hepatic Function Latest Ref Rng & Units 07/04/2016 12/25/2015 12/05/2014  Total Protein 6.5 - 8.1 g/dL 6.9 6.3 6.9  Albumin 3.5 - 5.0 g/dL 3.8 3.7 3.8  AST 15 - 41 U/L 12(L) 9(L) 14(L)  ALT 14 - 54 U/L 13(L) 9 13(L)  Alk Phosphatase 38 - 126 U/L 36(L) 38 39  Total Bilirubin 0.3 - 1.2 mg/dL 0.8 0.4 0.4  Bilirubin, Direct 0.1 - 0.5 mg/dL 0.2 - -    CBC from 05/10/2021(Dr.Bhutani)  WBC 5.4, H&H 11.8 and 36.5.  MCV 92.4 and platelet count 228K  Assessment:  #1.  Diabetic gastroparesis.  She developed orolingual dyskinesia with metoclopramide which had to be discontinued.  She did notice some relief with domperidone but then stopped working.  Botox pyloric  channel helped but last session resulted in severe pain and therefore that therapy was discontinued([facility]).  She also was started on erythromycin several years ago and it did not work.  She is presently on dietary measures.  #2.  Chronic GERD.  Symptom control is fair primarily resulting from gastroparesis.  #3.  Esophageal dysphagia.  She has achalasia status post Heller's myotomy over 9 years ago.  EGD last year did not reveal recurrence or stricture.  In March 2013.  #4.  History of colon carcinoma.  She is status post right hemicolectomy in March 2010.  She is up-to-date on surveillance colonoscopy.  May consider In August 2027 but then she may not since she would be 81 year old [place].  #5.  Recurrent hematochezia secondary to hemorrhoids.  She  has scheduled for hemorrhoidal banding on 07/09/2021 by Ms. Roseanne Kaufman, NP.   Plan:  Take Imodium/loperamide OTC 2 mg by mouth daily with breakfast.  If she becomes constipated she can drop dose to 1 mg daily. Patient will call if swallowing difficulty worsens. Office visit in 4 months.

## 2021-07-01 ENCOUNTER — Encounter (INDEPENDENT_AMBULATORY_CARE_PROVIDER_SITE_OTHER): Payer: Self-pay

## 2021-07-09 ENCOUNTER — Other Ambulatory Visit: Payer: Self-pay

## 2021-07-09 ENCOUNTER — Encounter: Payer: Self-pay | Admitting: Gastroenterology

## 2021-07-09 ENCOUNTER — Ambulatory Visit (INDEPENDENT_AMBULATORY_CARE_PROVIDER_SITE_OTHER): Payer: Medicare Other | Admitting: Gastroenterology

## 2021-07-09 VITALS — BP 150/77 | HR 66 | Temp 97.1°F | Ht 59.0 in | Wt 135.4 lb

## 2021-07-09 DIAGNOSIS — K642 Third degree hemorrhoids: Secondary | ICD-10-CM | POA: Diagnosis not present

## 2021-07-09 NOTE — Patient Instructions (Signed)
We will see you back in a few weeks for hemorrhoid banding.   Avoid straining, limit constipation.   Further recommendations to follow!  It was a pleasure to see you today. I want to create trusting relationships with patients to provide genuine, compassionate, and quality care. I value your feedback. If you receive a survey regarding your visit,  I greatly appreciate you taking time to fill this out.   Annitta Needs, PhD, ANP-BC [facility]

## 2021-07-09 NOTE — Progress Notes (Signed)
[place] Banding Note:   [name] is an 81 y.o. female presenting today for consideration of hemorrhoid banding. She has a history of colon cancer s/p hemicolectomy in March 2010. Recent colonoscopy Aug 2022 with normal TI, scattered diverticula, tubular adenomas, and external hemorrhoids. She has bleeding, pressure, itching, burning, leaking, and soiling.    The patient presents with symptomatic grade 2-3 hemorrhoids, unresponsive to maximal medical therapy, requesting rubber band ligation of her hemorrhoidal disease. All risks, benefits, and alternative forms of therapy were described and informed consent was obtained.  In the left lateral decubitus position anoscopic examination was attempted but visualization was difficult as liquid stool obscured view. I was able to see at least the left lateral column prominent, but I was not able to visualize remaining 2 columns.   The decision was made to band the left lateral internal hemorrhoid, and the [place] was used to perform band ligation without complication. Digital anorectal examination was then performed to assure proper positioning of the band, and to adjust the banded tissue as required. The patient was discharged home without pain or other issues. Dietary and behavioral recommendations were given and (if necessary prescriptions were given), along with follow-up instructions. The patient will return in followup and possible additional banding as required.  No complications were encountered and the patient tolerated the procedure well.   Annitta Needs, PhD, ANP-BC [facility]

## 2021-08-15 ENCOUNTER — Other Ambulatory Visit: Payer: Self-pay

## 2021-08-15 ENCOUNTER — Ambulatory Visit (INDEPENDENT_AMBULATORY_CARE_PROVIDER_SITE_OTHER): Payer: Medicare Other | Admitting: Gastroenterology

## 2021-08-15 ENCOUNTER — Encounter: Payer: Self-pay | Admitting: Gastroenterology

## 2021-08-15 VITALS — BP 140/70 | Temp 96.9°F | Ht 59.0 in | Wt 138.8 lb

## 2021-08-15 DIAGNOSIS — K642 Third degree hemorrhoids: Secondary | ICD-10-CM | POA: Diagnosis not present

## 2021-08-15 MED ORDER — HYDROCORTISONE (PERIANAL) 2.5 % EX CREA: 1.0000 "application " | TOPICAL_CREAM | Freq: Two times a day (BID) | CUTANEOUS | 1 refills | Status: DC

## 2021-08-15 NOTE — Patient Instructions (Signed)
I have sent a cream to [facility] to use instead of the preparation H. Use this twice a day per rectum! ? ?We will see you back for further banding! ? ?I enjoyed seeing you again today! As you know, I value our relationship and want to provide genuine, compassionate, and quality care. I welcome your feedback. If you receive a survey regarding your visit,  I greatly appreciate you taking time to fill this out. See you next time! ? ?Annitta Needs, PhD, ANP-BC ?[facility]  ? ?

## 2021-08-15 NOTE — Progress Notes (Signed)
? ? ?  [place] BANDING PROCEDURE NOTE ? ?[name] is an 81 y.o. female presenting today for consideration of hemorrhoid banding.  She has a history of colon cancer s/p hemicolectomy in March 2010. Recent colonoscopy Aug 2022 with normal TI, scattered diverticula, tubular adenomas, and external hemorrhoids. She has bleeding, pressure, itching, burning, leaking, and soiling. She has had left lateral banding thus far. Some improvement in symptoms.  ? ? ?The patient presents with symptomatic grade 2-3 hemorrhoids, unresponsive to maximal medical therapy, requesting rubber band ligation of her hemorrhoidal disease. All risks, benefits, and alternative forms of therapy were described and informed consent was obtained. ? ? ?The decision was made to band the right posterior internal hemorrhoid, and the [place] was used to perform band ligation without complication. Digital anorectal examination was then performed to assure proper positioning of the band, and to adjust the banded tissue as required. The patient was discharged home without pain or other issues. Dietary and behavioral recommendations were given, along with follow-up instructions. The patient will return in several weeks for followup and possible additional banding as required. ? ?No complications were encountered and the patient tolerated the procedure well.  ? ?Annitta Needs, PhD, ANP-BC ?[facility]  ? ?

## 2021-08-27 ENCOUNTER — Encounter: Payer: Self-pay | Admitting: Internal Medicine

## 2021-09-05 ENCOUNTER — Encounter: Payer: Medicare Other | Admitting: Gastroenterology

## 2021-10-10 ENCOUNTER — Encounter: Payer: Self-pay | Admitting: Gastroenterology

## 2021-10-10 ENCOUNTER — Other Ambulatory Visit: Payer: Self-pay | Admitting: Gastroenterology

## 2021-10-10 ENCOUNTER — Ambulatory Visit (INDEPENDENT_AMBULATORY_CARE_PROVIDER_SITE_OTHER): Payer: Medicare Other | Admitting: Gastroenterology

## 2021-10-10 VITALS — BP 138/80 | HR 59 | Temp 98.2°F | Ht 59.0 in | Wt 139.0 lb

## 2021-10-10 DIAGNOSIS — K642 Third degree hemorrhoids: Secondary | ICD-10-CM | POA: Diagnosis not present

## 2021-10-10 LAB — CBC WITH DIFFERENTIAL/PLATELET
Absolute Monocytes: 414 cells/uL (ref 200–950)
Basophils Absolute: 63 cells/uL (ref 0–200)
Basophils Relative: 0.7 %
Eosinophils Absolute: 144 cells/uL (ref 15–500)
Eosinophils Relative: 1.6 %
HCT: 38.7 % (ref 35.0–45.0)
Hemoglobin: 12.6 g/dL (ref 11.7–15.5)
Lymphs Abs: 3132 cells/uL (ref 850–3900)
MCH: 31 pg (ref 27.0–33.0)
MCHC: 32.6 g/dL (ref 32.0–36.0)
MCV: 95.1 fL (ref 80.0–100.0)
MPV: 12.7 fL — ABNORMAL HIGH (ref 7.5–12.5)
Monocytes Relative: 4.6 %
Neutro Abs: 5247 cells/uL (ref 1500–7800)
Neutrophils Relative %: 58.3 %
Platelets: 207 10*3/uL (ref 140–400)
RBC: 4.07 10*6/uL (ref 3.80–5.10)
RDW: 12.8 % (ref 11.0–15.0)
Total Lymphocyte: 34.8 %
WBC: 9 10*3/uL (ref 3.8–10.8)

## 2021-10-10 NOTE — Patient Instructions (Signed)
Please complete blood work. ? ?If you have large amounts of rectal bleeding, abdominal pain, weakness, dizziness, fatigue, please go to the emergency room. ? ?I will see you back in several weeks to consider further banding! ? ?I enjoyed seeing you again today! As you know, I value our relationship and want to provide genuine, compassionate, and quality care. I welcome your feedback. If you receive a survey regarding your visit,  I greatly appreciate you taking time to fill this out. See you next time! ? ?Annitta Needs, PhD, ANP-BC ?[facility]  ? ?

## 2021-10-10 NOTE — Progress Notes (Signed)
? ? ? ? ? ?  [place] BANDING PROCEDURE NOTE ? ?[name] is an 81 y.o. female presenting today for consideration of hemorrhoid banding.  ?Has had banding of left lateral and right posterior.  She has a history of colon cancer s/p hemicolectomy in March 2010. Recent colonoscopy Aug 2022 with normal TI, scattered diverticula, tubular adenomas, and external hemorrhoids. Notes large amount of rectal bleeding this morning. Wearing a pad. Vitals stable. Asymptomatic. No weakness, fatigue.  ? ? ?The patient presents with symptomatic grade 3 hemorrhoids, unresponsive to maximal medical therapy, requesting rubber band ligation of her hemorrhoidal disease. All risks, benefits, and alternative forms of therapy were described and informed consent was obtained. ? ?In the left lateral decubitus position, anoscopic examination revealed grade 3 hemorrhoids in the left lateral position. Appears this was site of bleeding this morning.  ? ?The decision was made to band the left lateral internal hemorrhoid, and the [place] was used to perform band ligation without complication. Digital anorectal examination was then performed to assure proper positioning of the band, and to adjust the banded tissue as required. The patient was discharged home without pain or other issues. Dietary and behavioral recommendations were given, along with follow-up instructions. The patient will return in several weeks for followup and possible additional banding as required. I am checking CBC today. Although she reported large amount of rectal bleeding this morning, it appears to have emanated from left lateral column.  ? ?No complications were encountered and the patient tolerated the procedure well.  ? ?Annitta Needs, PhD, ANP-BC ?[facility]  ? ?

## 2021-10-15 ENCOUNTER — Encounter (INDEPENDENT_AMBULATORY_CARE_PROVIDER_SITE_OTHER): Payer: Self-pay | Admitting: Internal Medicine

## 2021-10-15 ENCOUNTER — Ambulatory Visit (INDEPENDENT_AMBULATORY_CARE_PROVIDER_SITE_OTHER): Payer: Medicare Other | Admitting: Internal Medicine

## 2021-10-15 VITALS — BP 144/81 | HR 67 | Temp 99.2°F | Ht 59.0 in | Wt 136.1 lb

## 2021-10-15 DIAGNOSIS — E1143 Type 2 diabetes mellitus with diabetic autonomic (poly)neuropathy: Secondary | ICD-10-CM

## 2021-10-15 DIAGNOSIS — R197 Diarrhea, unspecified: Secondary | ICD-10-CM | POA: Diagnosis not present

## 2021-10-15 DIAGNOSIS — K642 Third degree hemorrhoids: Secondary | ICD-10-CM | POA: Diagnosis not present

## 2021-10-15 DIAGNOSIS — K3184 Gastroparesis: Secondary | ICD-10-CM

## 2021-10-15 MED ORDER — LOPERAMIDE HCL 2 MG PO CAPS: 2.0000 mg | ORAL_CAPSULE | Freq: Two times a day (BID) | ORAL | Status: AC | PRN

## 2021-10-15 MED ORDER — METRONIDAZOLE 250 MG PO TABS: 250.0000 mg | ORAL_TABLET | Freq: Three times a day (TID) | ORAL | 0 refills | Status: DC

## 2021-10-15 NOTE — Patient Instructions (Addendum)
Remember you next appointment with Ms. Tammy Bender, NP is on 10/24/2021 at 1 PM. ? ?Take metronidazole 250 mg by mouth after each meal/3 times a day for 10 days. ? ?Keep symptom diary so that we would know whether this medication has helped or not. ?If diarrhea does not improve will ask primary care physician to consider stopping metformin if possible. ? ?You can take loperamide 2 mg twice daily as needed after you finish the antibiotic. ?

## 2021-10-15 NOTE — Progress Notes (Signed)
Presenting complaint; ? ?Follow-up for GERD gastroparesis nausea abdominal pain and diarrhea. ? ?Database and subjective: ? ?Patient is 81 year old Caucasian female who is here for scheduled visit accompanied by her daughter Karna Christmas.  Patient was last seen on 04/09/2021.  She was continuing to experience hematochezia.  She was referred to Ms. Roseanne Kaufman, NP and has undergone 2 sessions of hemorrhoidal banding.  She is scheduled for next visit on 10/24/2021.  Patient reports decrease in bleeding episodes.  Now she only notices scant amount of bleeding. ?She has multiple other complaints.   ?She continue experience heartburn at least 2-4 times a week.  She is using Tums to every night.  She remains with diarrhea.  Stool frequency ranges from 1 to 6/day.  Imodium helps but not 100%.  She also complains of flatulence and cramping abdominal pain.  Abdominal pain started about 2 weeks ago and she has been having nausea ever since.  However she has not had any vomiting.  Her weight has been stable.  She says she ate supper anywhere between 5 and 8 PM and goes to bed anywhere between midnight and 3 AM.  She remains with dysphagia. ? ?Current Medications: ?Outpatient Encounter Medications as of 10/15/2021  ?Medication Sig  ? acetaminophen (TYLENOL) 500 MG tablet Take 500 mg by mouth every 6 (six) hours as needed for mild pain.   ? ALPRAZolam (XANAX XR) 1 MG 24 hr tablet Take 0.5-1 mg by mouth as needed for sleep (Anxiety).   ? dexlansoprazole (DEXILANT) 60 MG capsule Take 1 capsule (60 mg total) by mouth daily before breakfast.  ? DULoxetine (CYMBALTA) 60 MG capsule Take 60 mg by mouth every morning.   ? famotidine (PEPCID) 40 MG tablet Take 1 tablet (40 mg total) by mouth at bedtime.  ? hydrocortisone (ANUSOL-HC) 2.5 % rectal cream APPLY 1 APPLICATION RECTALLY TWICE DAILY.  ? loperamide (IMODIUM) 2 MG capsule Take 1 capsule (2 mg total) by mouth daily with breakfast.  ? metFORMIN (GLUCOPHAGE) 500 MG tablet Take 500 mg by mouth  daily.  ? Multiple Vitamin (MULTIVITAMIN WITH MINERALS) TABS tablet Take 1 tablet by mouth daily.  ? oxyCODONE-acetaminophen (PERCOCET) 7.5-325 MG tablet Take 1 tablet by mouth every 4 (four) hours as needed for severe pain.  ? polyvinyl alcohol (LIQUIFILM TEARS) 1.4 % ophthalmic solution Place 1 drop into both eyes 3 (three) times daily as needed for dry eyes.  ? silodosin (RAPAFLO) 8 MG CAPS capsule Take 8 mg by mouth at bedtime.   ? topiramate (TOPAMAX) 100 MG tablet Take 1 tablet (100 mg total) by mouth 2 (two) times daily.  ? Travoprost, BAK Free, (TRAVATAN) 0.004 % SOLN ophthalmic solution Place 1 drop into both eyes at bedtime.   ? lisinopril (ZESTRIL) 2.5 MG tablet Take 2.5 mg by mouth daily. (Patient not taking: Reported on 10/15/2021)  ? ?No facility-administered encounter medications on file as of 10/15/2021.  ? ? ? ?Objective: ?Blood pressure (!) 144/81, pulse 67, temperature 99.2 ?F (37.3 ?C), temperature source Oral, height _0  (1.499 m), weight 136 lb 1.6 oz (61.7 kg). ?Patient is alert and in no acute distress. ?Conjunctiva is pink. Sclera is nonicteric ?Oropharyngeal mucosa is normal. ?No neck masses or thyromegaly noted. ?Cardiac exam with regular rhythm normal S1 and S2. No murmur or gallop noted. ?Lungs are clear to auscultation. ?Abdomen is symmetrical with upper midline and right subcostal scars.  On palpation abdomen is soft and nontender with organomegaly or masses. ?No LE edema or clubbing noted. ? ?  Labs/studies Results: ? ? ? ?  Latest Ref Rng & Units 10/10/2021  ?  1:47 PM 12/04/2020  ?  8:05 AM 10/01/2018  ? 10:21 AM  ?CBC  ?WBC 3.8 - 10.8 Thousand/uL 9.0   7.4   8.0    ?Hemoglobin 11.7 - 15.5 g/dL 12.6   12.4   11.1    ?Hematocrit 35.0 - 45.0 % 38.7   40.9   35.9    ?Platelets 140 - 400 Thousand/uL 207   223   216    ?  ? ?  Latest Ref Rng & Units 12/04/2020  ?  8:05 AM 10/01/2018  ? 10:21 AM 07/04/2016  ?  4:24 AM  ?CMP  ?Glucose 70 - 99 mg/dL 172   182   254    ?BUN 8 - 23 mg/dL _0 ?Creatinine 0.44 - 1.00 mg/dL 0.80   0.93   0.60    ?Sodium 135 - 145 mmol/L 140   135   134    ?Potassium 3.5 - 5.1 mmol/L 3.4   3.4   2.9    ?Chloride 98 - 111 mmol/L 102   104   97    ?CO2 22 - 32 mmol/L 23   19     ?Calcium 8.9 - 10.3 mg/dL 9.1   9.5     ?  ? ?  Latest Ref Rng & Units 07/04/2016  ?  3:35 AM 12/25/2015  ? 10:04 AM 12/05/2014  ?  6:52 PM  ?Hepatic Function  ?Total Protein 6.5 - 8.1 g/dL 6.9   6.3   6.9    ?Albumin 3.5 - 5.0 g/dL 3.8   3.7   3.8    ?AST 15 - 41 U/L _1 ?ALT 14 - 54 U/L _2 ?Alk Phosphatase 38 - 126 U/L 36   38   39    ?Total Bilirubin 0.3 - 1.2 mg/dL 0.8   0.4   0.4    ?Bilirubin, Direct 0.1 - 0.5 mg/dL 0.2      ?  ? ?Assessment: ? ?#1.  Diarrhea with abdominal pain and bloating.  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  She is status post right hemicolectomy for carcinoma 13 years ago. ? ?#2.  GERD gastroparesis.  Symptoms are poor currently controlled with dietary measures and PPI.  Patient advised to eat her supper no later than 5 or 6 PM.  Supper should be a smaller meal compared to breakfast and lunch.  Underlying esophageal dysmotility/achalasia likely the culprit. ?Please note she has been on metoclopramide in the past and was discontinued because of side effects. ? ?#3.  Hematochezia secondary to hemorrhoids.  Hemorrhoidal banding seem to help. ? ?#4.  History of colon carcinoma.  She is status post right hemicolectomy in 2010.  She is up-to-date on surveillance protocol.  Last colonoscopy was in August 2022. ? ?#4.  History of achalasia.  She is status Heller's myotomy and February 2013.  Dysphagia stable. ? ? ?Plan: ? ?Metronidazole 250 mg by mouth after each meal for 10 days. ?Patient will keep symptom diary with help her daughter Karna Christmas and call with progress report when she finishes antibiotic. ?If diarrhea persist she will increase loperamide to 2 mg twice daily. ?Office visit in 4 months. ? ? ? ? ? ?

## 2021-10-21 ENCOUNTER — Other Ambulatory Visit: Payer: Self-pay | Admitting: Family Medicine

## 2021-10-21 DIAGNOSIS — N63 Unspecified lump in unspecified breast: Secondary | ICD-10-CM

## 2021-10-24 ENCOUNTER — Encounter: Payer: Self-pay | Admitting: Gastroenterology

## 2021-10-24 ENCOUNTER — Ambulatory Visit (INDEPENDENT_AMBULATORY_CARE_PROVIDER_SITE_OTHER): Payer: Medicare Other | Admitting: Gastroenterology

## 2021-10-24 VITALS — BP 116/72 | HR 60 | Temp 97.7°F | Ht 59.0 in | Wt 140.2 lb

## 2021-10-24 DIAGNOSIS — K642 Third degree hemorrhoids: Secondary | ICD-10-CM

## 2021-10-24 NOTE — Progress Notes (Signed)
    [place] BANDING PROCEDURE NOTE  FRANCHESCA VENEZIANO is an 81 y.o. female presenting today for consideration of hemorrhoid banding. She has a history of colon cancer s/p hemicolectomy in March 2010. Recent colonoscopy Aug 2022 with normal TI, scattered diverticula, tubular adenomas, and external hemorrhoids. She has had left lateral X 2 and right posterior. Bleeding improved significantly but still present.    The patient presents with symptomatic grade 3 hemorrhoids, unresponsive to maximal medical therapy, requesting rubber band ligation of his/her hemorrhoidal disease. All risks, benefits, and alternative forms of therapy were described and informed consent was obtained.  The decision was made to band neutrally, and the [place] was used to perform band ligation without complication. Digital anorectal examination was then performed to assure proper positioning of the band, and to adjust the banded tissue as required. Appeared to again be left lateral column.  The patient was discharged home without pain or other issues. Dietary and behavioral recommendations were given, along with follow-up instructions. The patient will return in several weeks for followup and possible additional banding as required.  No complications were encountered and the patient tolerated the procedure well.   Annitta Needs, PhD, ANP-BC [facility]

## 2021-10-24 NOTE — Patient Instructions (Signed)
We will see you back in 2 weeks!  If you are doing well, you can cancel that appointment!  I enjoyed seeing you again today! As you know, I value our relationship and want to provide genuine, compassionate, and quality care. I welcome your feedback. If you receive a survey regarding your visit,  I greatly appreciate you taking time to fill this out. See you next time!  Annitta Needs, PhD, ANP-BC [facility]

## 2021-11-21 ENCOUNTER — Encounter (HOSPITAL_COMMUNITY): Payer: Medicare Other

## 2021-11-22 ENCOUNTER — Ambulatory Visit (HOSPITAL_COMMUNITY)
Admission: RE | Admit: 2021-11-22 | Discharge: 2021-11-22 | Disposition: A | Discharge status: Home / Self Care | Payer: Medicare Other | Source: Ambulatory Visit | Attending: Nephrology | Admitting: Nephrology

## 2021-11-22 ENCOUNTER — Ambulatory Visit
Admission: RE | Admit: 2021-11-22 | Discharge: 2021-11-22 | Disposition: A | Discharge status: Home / Self Care | Payer: Medicare Other | Source: Ambulatory Visit | Attending: Family Medicine | Admitting: Family Medicine

## 2021-11-22 DIAGNOSIS — R001 Bradycardia, unspecified: Secondary | ICD-10-CM | POA: Diagnosis not present

## 2021-11-22 DIAGNOSIS — E1122 Type 2 diabetes mellitus with diabetic chronic kidney disease: Secondary | ICD-10-CM | POA: Insufficient documentation

## 2021-11-22 DIAGNOSIS — N6314 Unspecified lump in the right breast, lower inner quadrant: Secondary | ICD-10-CM | POA: Insufficient documentation

## 2021-11-22 DIAGNOSIS — R809 Proteinuria, unspecified: Secondary | ICD-10-CM | POA: Insufficient documentation

## 2021-11-22 DIAGNOSIS — I129 Hypertensive chronic kidney disease with stage 1 through stage 4 chronic kidney disease, or unspecified chronic kidney disease: Secondary | ICD-10-CM | POA: Insufficient documentation

## 2021-11-22 DIAGNOSIS — N63 Unspecified lump in unspecified breast: Secondary | ICD-10-CM | POA: Diagnosis present

## 2021-11-22 DIAGNOSIS — N189 Chronic kidney disease, unspecified: Secondary | ICD-10-CM | POA: Diagnosis not present

## 2021-11-26 ENCOUNTER — Ambulatory Visit (HOSPITAL_COMMUNITY): Payer: Medicare Other

## 2021-11-27 ENCOUNTER — Encounter: Payer: Medicare Other | Admitting: Gastroenterology

## 2021-12-05 ENCOUNTER — Ambulatory Visit (INDEPENDENT_AMBULATORY_CARE_PROVIDER_SITE_OTHER): Payer: Medicare Other | Admitting: Gastroenterology

## 2021-12-05 ENCOUNTER — Encounter: Payer: Self-pay | Admitting: Gastroenterology

## 2021-12-05 VITALS — BP 139/63 | HR 57 | Temp 97.5°F | Ht 59.0 in | Wt 139.0 lb

## 2021-12-05 DIAGNOSIS — K642 Third degree hemorrhoids: Secondary | ICD-10-CM | POA: Diagnosis not present

## 2021-12-05 NOTE — Patient Instructions (Signed)
Please continue to avoid straining.  You should limit your toilet time to 2-3 minutes at the most.   Continue to avoid constipation.  Please call me with any concerns or issues!  I will make sure you have follow-up with Scherrie Gerlach, NP, at Dr. Olevia Perches office!  I feel we have reached maximum benefit with banding. You still do have tissue, but it is too close to the rectal opening to band without causing pain.  Annitta Needs, PhD, ANP-BC [facility]

## 2021-12-05 NOTE — Progress Notes (Signed)
      [place] BANDING PROCEDURE NOTE  KASSEY LAFOREST is an 81 y.o. female presenting today for consideration of hemorrhoid banding. She has a history of colon cancer s/p hemicolectomy in March 2010. Recent colonoscopy Aug 2022 with normal TI, scattered diverticula, tubular adenomas, and external hemorrhoids. She has had left lateral X 2, right posterior, and neutral (neutral appeared to grab the left lateral)  The patient presents with symptomatic grade 3 hemorrhoids, unresponsive to maximal medical therapy, requesting rubber band ligation of her hemorrhoidal disease. All risks, benefits, and alternative forms of therapy were described and informed consent was obtained.  The decision was made to band the left lateral internal hemorrhoid, and the [place] was used to perform band ligation without complication. Digital anorectal examination was then performed to assure proper positioning of the band, and to adjust the banded tissue as required. The patient was discharged home without pain or other issues. Dietary and behavioral recommendations were given, along with follow-up instructions.    The left lateral column continues to be an issue and appears to close to the anal opening to pursue hemorrhoid banding without discomfort. At this point, surgery would be the definitive treatment. We could potentially try to band again in the future, but I feel we have reached maximum effect with outpatient banding.   No complications were encountered and the patient tolerated the procedure well.   Annitta Needs, PhD, ANP-BC [facility]

## 2021-12-06 ENCOUNTER — Encounter: Payer: Self-pay | Admitting: Urology

## 2021-12-06 ENCOUNTER — Ambulatory Visit (INDEPENDENT_AMBULATORY_CARE_PROVIDER_SITE_OTHER): Payer: Medicare Other | Admitting: Urology

## 2021-12-06 VITALS — BP 154/58 | HR 46

## 2021-12-06 DIAGNOSIS — N8111 Cystocele, midline: Secondary | ICD-10-CM | POA: Insufficient documentation

## 2021-12-06 DIAGNOSIS — M6289 Other specified disorders of muscle: Secondary | ICD-10-CM | POA: Diagnosis not present

## 2021-12-06 DIAGNOSIS — R339 Retention of urine, unspecified: Secondary | ICD-10-CM

## 2021-12-06 LAB — URINALYSIS, ROUTINE W REFLEX MICROSCOPIC
Bilirubin, UA: NEGATIVE
Ketones, UA: NEGATIVE
Leukocytes,UA: NEGATIVE
Nitrite, UA: NEGATIVE
Protein,UA: NEGATIVE
RBC, UA: NEGATIVE
Specific Gravity, UA: 1.01 (ref 1.005–1.030)
Urobilinogen, Ur: 0.2 mg/dL (ref 0.2–1.0)
pH, UA: 6.5 (ref 5.0–7.5)

## 2021-12-06 LAB — BLADDER SCAN AMB NON-IMAGING: Scan Result: 166

## 2021-12-06 NOTE — Progress Notes (Signed)
Your   Assessment: 1. Incomplete bladder emptying   2. Pelvic floor dysfunction   3. Cystocele, midline     Plan: I reviewed the patient's records from Dr. Theador Hawthorne as well as her most recent note from [facility]. I discussed possible causes for her incomplete emptying including her cystocele, neurogenic bladder, and pelvic floor dysfunction. Recommend further evaluation with urodynamics. Continue silodosin. Return to office to discuss results of urodynamics when available.  Chief Complaint:  Chief Complaint  Patient presents with   incomplete bladder emptying    History of Present Illness:  [name] is a 81 y.o. year old female who is seen in consultation from Ulice Bold, MD for evaluation of urinary symptoms.  She is followed by Dr.Bhutani for chronic kidney disease.  Her renal function is stable with a creatinine of 1.04 on 11/14/21.  She reported difficulty initiating bladder emptying at her recent visit.  She has been followed by Dr. Matilde Sprang at [facility] with her last visit in December 2022.  She has been managed for her bladder symptoms with Rapaflo and physical therapy for pelvic floor dyssynergia.  She was restarted on her Rapaflo at her last visit in 12/22.   She reports continued symptoms of urinary hesitancy, straining, and sensation of incomplete emptying.  She reports voiding only twice yesterday.  She continues on Rapaflo.   Past Medical History:  Past Medical History:  Diagnosis Date   Anemia    Anxiety    Arthritis    "all over" (04/19/2013)   Asthma    Bleeding stomach ulcer 02/08/1979   Bloating    Chronic back pain    Chronic headaches    Chronic heartburn    Chronic neck pain    Chronic pain    Colon cancer (HCC)    DDD (degenerative disc disease), lumbar    Diabetic peripheral neuropathy ([place])    "in my feet" (04/19/2013)   Dizziness    Dysphagia    Family history of anesthesia complication    "daughter has PONV too"  (04/19/2013)   Fibromyalgia    Gastroesophageal reflux    Gastroparesis    Glaucoma, bilateral    Hiatal hernia    "had it before; had OR; got it again" (04/19/2013)   History of blood transfusion 02/08/1979   "w/bleeding stomach ulcer" (04/19/2013)   History of kidney stones    Migraine    "take RX for it qd" (04/19/2013)   Nausea    Pneumonia 04/09/2012   PONV (postoperative nausea and vomiting)    Renal insufficiency    TIA (transient ischemic attack)    "3-4 before starting RX; none since" (04/19/2013)   Type II diabetes mellitus ([place])    Walking pneumonia ~ 1966    Past Surgical History:  Past Surgical History:  Procedure Laterality Date   ABDOMINAL ADHESION SURGERY  ~ 2012   "repaired wrap where they did hiatal hernia OR too" 911/04/2013)   ANTERIOR CERVICAL DISCECTOMY  ~ 2009   "only cleaned out arthritis and spurs" (04/19/2013)   BIOPSY  01/09/2021   Procedure: BIOPSY;  Surgeon: Rogene Houston, MD;  [facility];  Service: Endoscopy;;  antrum; distal esophagus;   CATARACT EXTRACTION W/ INTRAOCULAR LENS IMPLANT Left 04/13/2013   CHOLECYSTECTOMY  1980's   COLON SURGERY     COLONOSCOPY  10/11/2010   COLONOSCOPY  11/23/2009   COLONOSCOPY  07/28/2008   W/SNARE   COLONOSCOPY  06/28/07   COLONOSCOPY  05/10/07  W/POLYP   COLONOSCOPY  12/28/00   COLONOSCOPY N/A 06/29/2015   Procedure: COLONOSCOPY;  Surgeon: Rogene Houston, MD;  [facility];  Service: Endoscopy;  Laterality: N/A;  135   COLONOSCOPY WITH ESOPHAGOGASTRODUODENOSCOPY (EGD) N/A 05/13/2013   Procedure: COLONOSCOPY WITH ESOPHAGOGASTRODUODENOSCOPY (EGD);  Surgeon: Rogene Houston, MD;  [facility];  Service: Endoscopy;  Laterality: N/A;  855   COLONOSCOPY WITH PROPOFOL N/A 01/09/2021   Procedure: COLONOSCOPY WITH PROPOFOL;  Surgeon: Rogene Houston, MD;  [facility];  Service: Endoscopy;  Laterality: N/A;  12:50   ESOPHAGOGASTRODUODENOSCOPY (EGD) WITH PROPOFOL N/A  10/06/2018   Procedure: ESOPHAGOGASTRODUODENOSCOPY (EGD) WITH PROPOFOL;  Surgeon: Rogene Houston, MD;  [facility];  Service: Endoscopy;  Laterality: N/A;   ESOPHAGOGASTRODUODENOSCOPY (EGD) WITH PROPOFOL N/A 01/09/2021   Procedure: ESOPHAGOGASTRODUODENOSCOPY (EGD) WITH PROPOFOL;  Surgeon: Rogene Houston, MD;  [facility];  Service: Endoscopy;  Laterality: N/A;   HEMICOLECTOMY  2010   ZIEGLER   HIATAL HERNIA REPAIR  1970's   LEFT HEART CATHETERIZATION WITH CORONARY ANGIOGRAM N/A 04/19/2013   Procedure: LEFT HEART CATHETERIZATION WITH CORONARY ANGIOGRAM;  Surgeon: Peter M Martinique, MD;  Location: [facility] CATH LAB;  Service: Cardiovascular;  Laterality: N/A;   POLYPECTOMY  10/06/2018   Procedure: POLYPECTOMY;  Surgeon: Rogene Houston, MD;  [facility];  Service: Endoscopy;;  gastric   POLYPECTOMY  01/09/2021   Procedure: POLYPECTOMY;  Surgeon: Rogene Houston, MD;  [facility];  Service: Endoscopy;;  transverse;splenic flexure   [place]   "had a broken neck" (04/19/2013)   TONSILLECTOMY  ~ [place] Bilateral 12/04/2020   Procedure: BILATERAL TEMPOROMANDIBULAR JOINT ARTHROCENTESIS; DENTAL EXTRACTION TEETH #3,6,7,8,9,10,11,12,19,23,24,25,26,32 WITH ALVEOLOPLASTY;  Surgeon: Diona Browner, DMD;  Location: [place];  Service: Oral Surgery;  Laterality: Bilateral;   UPPER GASTROINTESTINAL ENDOSCOPY  10/11/2010   EGD ED   UPPER GASTROINTESTINAL ENDOSCOPY  11/23/2009   UPPER GASTROINTESTINAL ENDOSCOPY  05/10/07   EGD ED   UPPER GASTROINTESTINAL ENDOSCOPY  08/11/01   EGD ED   VAGINAL HYSTERECTOMY      Allergies:  Allergies  Allergen Reactions   Botox [Onabotulinumtoxina]     Patient states that it became loose in her stomach , and it caused multiple problems.   Morphine Anaphylaxis and Other (See Comments)    "it will kill me" "made me stop breathing"   Aspirin Other (See Comments)    Gi bleed    Cefuroxime  Axetil Nausea And Vomiting   Codeine Nausea And Vomiting   Diltiazem Nausea And Vomiting   Dronabinol Nausea And Vomiting and Palpitations   Shellfish Allergy Other (See Comments)    Blood sugar drops   Other     Mold and trees   Ondansetron Hives   Penicillins Rash    Did it involve swelling of the face/tongue/throat, SOB, or low BP? No Did it involve sudden or severe rash/hives, skin peeling, or any reaction on the inside of your mouth or nose? Yes Did you need to seek medical attention at a hospital or doctor's office? Yes When did it last happen?  Over 10 years  If all above answers are "NO", may proceed with cephalosporin use.     Family History:  Family History  Problem Relation Age of Onset   Heart disease Mother    Diabetes Mother    Dementia Father    Healthy Sister  Diabetes Brother    Kidney cancer Brother    Diabetes Brother    Neuropathy Brother    Diabetes Daughter    Diabetes Daughter    Diabetes Son     Social History:  Social History   Tobacco Use   Smoking status: Former    Packs/day: 1.50    Years: 15.00    Total pack years: 22.50    Types: Cigarettes    Quit date: 03/17/1991    Years since quitting: 30.7    Passive exposure: Past   Smokeless tobacco: Never   Tobacco comments:    Patient states that it has ben greater than 20 years since she quit smoking  Vaping Use   Vaping Use: Never used  Substance Use Topics   Alcohol use: No    Alcohol/week: 0.0 standard drinks of alcohol   Drug use: No    Review of symptoms:  Constitutional:  Negative for unexplained weight loss, night sweats, fever, chills ENT:  Negative for nose bleeds, sinus pain, painful swallowing CV:  Negative for chest pain, shortness of breath, exercise intolerance, palpitations, loss of consciousness Resp:  Negative for cough, wheezing, shortness of breath GI:  Negative for nausea, vomiting, diarrhea, bloody stools GU:  Positives noted in HPI; otherwise negative for  gross hematuria, dysuria, urinary incontinence Neuro:  Negative for seizures, poor balance, limb weakness, slurred speech Psych:  Negative for lack of energy, depression, anxiety Endocrine:  Negative for polydipsia, polyuria, symptoms of hypoglycemia (dizziness, hunger, sweating) Hematologic:  Negative for anemia, purpura, petechia, prolonged or excessive bleeding, use of anticoagulants  Allergic:  Negative for difficulty breathing or choking as a result of exposure to anything; no shellfish allergy; no allergic response (rash/itch) to materials, foods  Physical exam: BP (!) 154/58   Pulse (!) 46  GENERAL APPEARANCE:  Well appearing, well developed, well nourished, NAD HEENT: Atraumatic, Normocephalic, oropharynx clear. NECK: Supple without lymphadenopathy or thyromegaly. LUNGS: Clear to auscultation bilaterally. HEART: Regular Rate and Rhythm without murmurs, gallops, or rubs. ABDOMEN: Soft, non-tender, No Masses. EXTREMITIES: Moves all extremities well.  Without clubbing, cyanosis, or edema. NEUROLOGIC:  Alert and oriented x 3, CN II-XII grossly intact.  MENTAL STATUS:  Appropriate. BACK:  Non-tender to palpation.  No CVAT SKIN:  Warm, dry and intact.   GU: Urethra: no mass, well supported, no leakage with cough or valsava Vagina: atrophic changes, grade 2 cystocele   Results: U/A: dipstick negative  PVR = 166 ml  I&O cath = 200 ml

## 2022-01-15 ENCOUNTER — Encounter: Payer: Self-pay | Admitting: Urology

## 2022-01-15 ENCOUNTER — Ambulatory Visit (INDEPENDENT_AMBULATORY_CARE_PROVIDER_SITE_OTHER): Payer: Medicare Other | Admitting: Urology

## 2022-01-15 VITALS — BP 151/77 | HR 56 | Ht 59.0 in | Wt 130.0 lb

## 2022-01-15 DIAGNOSIS — R339 Retention of urine, unspecified: Secondary | ICD-10-CM | POA: Diagnosis not present

## 2022-01-15 LAB — URINALYSIS, ROUTINE W REFLEX MICROSCOPIC
Bilirubin, UA: NEGATIVE
Ketones, UA: NEGATIVE
Leukocytes,UA: NEGATIVE
Nitrite, UA: NEGATIVE
Protein,UA: NEGATIVE
RBC, UA: NEGATIVE
Specific Gravity, UA: 1.01 (ref 1.005–1.030)
Urobilinogen, Ur: 0.2 mg/dL (ref 0.2–1.0)
pH, UA: 7 (ref 5.0–7.5)

## 2022-01-15 LAB — BLADDER SCAN AMB NON-IMAGING: Scan Result: 540

## 2022-01-15 NOTE — Progress Notes (Signed)
Assessment: 1. Incomplete bladder emptying     Plan: I personally reviewed the urodynamic study from 01/03/2022 with results as noted below. I discussed these results in detail with the patient and her daughter. Options for management of her incomplete bladder emptying reviewed including continued medical management, continued pelvic floor physical therapy, and self intermittent catheterization.  I discussed that self catheterization would allow complete bladder emptying but would likely not change any symptoms.  I also discussed the potential risk of infection associated with self-catheterization. Continue silodosin. 24-hour voiding diary will be completed and brought to the office for review.  Chief Complaint:  Chief Complaint  Patient presents with   Incomplete bladder emptying    History of Present Illness:  [name] is a 81 y.o. year old female who is seen for further evaluation of urinary symptoms.  She is followed by Dr.Bhutani for chronic kidney disease.  Her renal function is stable with a creatinine of 1.04 on 11/14/21.  She reported difficulty initiating bladder emptying at her recent visit.  She has been followed by Dr. Matilde Sprang at [facility] with her last visit in December 2022.  She has been managed for her bladder symptoms with Rapaflo and physical therapy for pelvic floor dyssynergia.  She was restarted on her Rapaflo at her last visit in 12/22.   She reported continued symptoms of urinary hesitancy, straining, and sensation of incomplete emptying.  She voids 2- 4 times/day.  She continued on Rapaflo. Pelvic exam demonstrated a grade 2 cystocele.  I&O cath  showed a volume of 200 mL. Urodynamics from 01/03/2022 demonstrated a max capacity of 633 mL with normal sensation.  No evidence of stress incontinence or instability.  She was able to generate a voluntary contraction and void 316 mL with a max flow rate of 16 mL/s.  EMG leads required during voiding.  PVR was  316 mL.  No significant descent of the bladder was appreciated.  She returns today for follow-up and a discussion of the urodynamic results.  Her urinary symptoms remain unchanged.  She continues with urinary hesitancy, straining to void, and sensation of incomplete emptying. She continues on silodosin.  Portions of the above documentation were copied from a prior visit for review purposes only.  Past Medical History:  Past Medical History:  Diagnosis Date   Anemia    Anxiety    Arthritis    "all over" (04/19/2013)   Asthma    Bleeding stomach ulcer 02/08/1979   Bloating    Chronic back pain    Chronic headaches    Chronic heartburn    Chronic neck pain    Chronic pain    Colon cancer (HCC)    DDD (degenerative disc disease), lumbar    Diabetic peripheral neuropathy ([place])    "in my feet" (04/19/2013)   Dizziness    Dysphagia    Family history of anesthesia complication    "daughter has PONV too" (04/19/2013)   Fibromyalgia    Gastroesophageal reflux    Gastroparesis    Glaucoma, bilateral    Hiatal hernia    "had it before; had OR; got it again" (04/19/2013)   History of blood transfusion 02/08/1979   "w/bleeding stomach ulcer" (04/19/2013)   History of kidney stones    Migraine    "take RX for it qd" (04/19/2013)   Nausea    Pneumonia 04/09/2012   PONV (postoperative nausea and vomiting)    Renal insufficiency    TIA (transient ischemic attack)    "  3-4 before starting RX; none since" (04/19/2013)   Type II diabetes mellitus ([place])    Walking pneumonia ~ 1966    Past Surgical History:  Past Surgical History:  Procedure Laterality Date   ABDOMINAL ADHESION SURGERY  ~ 2012   "repaired wrap where they did hiatal hernia OR too" 911/04/2013)   ANTERIOR CERVICAL DISCECTOMY  ~ 2009   "only cleaned out arthritis and spurs" (04/19/2013)   BIOPSY  01/09/2021   Procedure: BIOPSY;  Surgeon: Rogene Houston, MD;  [facility];  Service: Endoscopy;;  antrum;  distal esophagus;   CATARACT EXTRACTION W/ INTRAOCULAR LENS IMPLANT Left 04/13/2013   CHOLECYSTECTOMY  1980's   COLON SURGERY     COLONOSCOPY  10/11/2010   COLONOSCOPY  11/23/2009   COLONOSCOPY  07/28/2008   W/SNARE   COLONOSCOPY  06/28/07   COLONOSCOPY  05/10/07   W/POLYP   COLONOSCOPY  12/28/00   COLONOSCOPY N/A 06/29/2015   Procedure: COLONOSCOPY;  Surgeon: Rogene Houston, MD;  [facility];  Service: Endoscopy;  Laterality: N/A;  135   COLONOSCOPY WITH ESOPHAGOGASTRODUODENOSCOPY (EGD) N/A 05/13/2013   Procedure: COLONOSCOPY WITH ESOPHAGOGASTRODUODENOSCOPY (EGD);  Surgeon: Rogene Houston, MD;  [facility];  Service: Endoscopy;  Laterality: N/A;  855   COLONOSCOPY WITH PROPOFOL N/A 01/09/2021   Procedure: COLONOSCOPY WITH PROPOFOL;  Surgeon: Rogene Houston, MD;  [facility];  Service: Endoscopy;  Laterality: N/A;  12:50   ESOPHAGOGASTRODUODENOSCOPY (EGD) WITH PROPOFOL N/A 10/06/2018   Procedure: ESOPHAGOGASTRODUODENOSCOPY (EGD) WITH PROPOFOL;  Surgeon: Rogene Houston, MD;  [facility];  Service: Endoscopy;  Laterality: N/A;   ESOPHAGOGASTRODUODENOSCOPY (EGD) WITH PROPOFOL N/A 01/09/2021   Procedure: ESOPHAGOGASTRODUODENOSCOPY (EGD) WITH PROPOFOL;  Surgeon: Rogene Houston, MD;  [facility];  Service: Endoscopy;  Laterality: N/A;   HEMICOLECTOMY  2010   ZIEGLER   HIATAL HERNIA REPAIR  1970's   LEFT HEART CATHETERIZATION WITH CORONARY ANGIOGRAM N/A 04/19/2013   Procedure: LEFT HEART CATHETERIZATION WITH CORONARY ANGIOGRAM;  Surgeon: Peter M Martinique, MD;  Location: [facility] CATH LAB;  Service: Cardiovascular;  Laterality: N/A;   POLYPECTOMY  10/06/2018   Procedure: POLYPECTOMY;  Surgeon: Rogene Houston, MD;  [facility];  Service: Endoscopy;;  gastric   POLYPECTOMY  01/09/2021   Procedure: POLYPECTOMY;  Surgeon: Rogene Houston, MD;  [facility];  Service: Endoscopy;;  transverse;splenic flexure   [place]   "had a broken neck" (04/19/2013)   TONSILLECTOMY  ~ [place] Bilateral 12/04/2020   Procedure: BILATERAL TEMPOROMANDIBULAR JOINT ARTHROCENTESIS; DENTAL EXTRACTION TEETH #3,6,7,8,9,10,11,12,19,23,24,25,26,32 WITH ALVEOLOPLASTY;  Surgeon: Diona Browner, DMD;  Location: [place];  Service: Oral Surgery;  Laterality: Bilateral;   UPPER GASTROINTESTINAL ENDOSCOPY  10/11/2010   EGD ED   UPPER GASTROINTESTINAL ENDOSCOPY  11/23/2009   UPPER GASTROINTESTINAL ENDOSCOPY  05/10/07   EGD ED   UPPER GASTROINTESTINAL ENDOSCOPY  08/11/01   EGD ED   VAGINAL HYSTERECTOMY      Allergies:  Allergies  Allergen Reactions   Botox [Onabotulinumtoxina]     Patient states that it became loose in her stomach , and it caused multiple problems.   Morphine Anaphylaxis and Other (See Comments)    "it will kill me" "made me stop breathing"   Aspirin Other (See Comments)    Gi bleed    Cefuroxime Axetil Nausea And Vomiting   Codeine Nausea And Vomiting   Diltiazem Nausea  And Vomiting   Dronabinol Nausea And Vomiting and Palpitations   Shellfish Allergy Other (See Comments)    Blood sugar drops   Other     Mold and trees   Ondansetron Hives   Penicillins Rash    Did it involve swelling of the face/tongue/throat, SOB, or low BP? No Did it involve sudden or severe rash/hives, skin peeling, or any reaction on the inside of your mouth or nose? Yes Did you need to seek medical attention at a hospital or doctor's office? Yes When did it last happen?  Over 10 years  If all above answers are "NO", may proceed with cephalosporin use.     Family History:  Family History  Problem Relation Age of Onset   Heart disease Mother    Diabetes Mother    Dementia Father    Healthy Sister    Diabetes Brother    Kidney cancer Brother    Diabetes Brother    Neuropathy Brother    Diabetes Daughter    Diabetes Daughter    Diabetes Son     Social History:  Social History    Tobacco Use   Smoking status: Former    Packs/day: 1.50    Years: 15.00    Total pack years: 22.50    Types: Cigarettes    Quit date: 03/17/1991    Years since quitting: 30.8    Passive exposure: Past   Smokeless tobacco: Never   Tobacco comments:    Patient states that it has ben greater than 20 years since she quit smoking  Vaping Use   Vaping Use: Never used  Substance Use Topics   Alcohol use: No    Alcohol/week: 0.0 standard drinks of alcohol   Drug use: No    ROS: Constitutional:  Negative for fever, chills, weight loss CV: Negative for chest pain, previous MI, hypertension Respiratory:  Negative for shortness of breath, wheezing, sleep apnea, frequent cough GI:  Negative for nausea, vomiting, bloody stool, GERD  Physical exam: Ht '4\' 11"'$  (1.499 m)   Wt 130 lb (59 kg)   BMI 26.26 kg/m  GENERAL APPEARANCE:  Well appearing, well developed, well nourished, NAD HEENT:  Atraumatic, normocephalic, oropharynx clear NECK:  Supple without lymphadenopathy or thyromegaly ABDOMEN:  Soft, non-tender, no masses EXTREMITIES:  Moves all extremities well, without clubbing, cyanosis, or edema NEUROLOGIC:  Alert and oriented x 3, normal gait, CN II-XII grossly intact MENTAL STATUS:  appropriate BACK:  Non-tender to palpation, No CVAT SKIN:  Warm, dry, and intact   Results: U/A: Dipstick negative  PVR = 290 ml

## 2022-01-15 NOTE — Progress Notes (Signed)
post void residual =560m  post void residual =2950m

## 2022-02-17 ENCOUNTER — Ambulatory Visit (INDEPENDENT_AMBULATORY_CARE_PROVIDER_SITE_OTHER): Payer: Medicare Other | Admitting: Gastroenterology

## 2022-02-17 ENCOUNTER — Encounter (INDEPENDENT_AMBULATORY_CARE_PROVIDER_SITE_OTHER): Payer: Self-pay | Admitting: Gastroenterology

## 2022-02-17 ENCOUNTER — Other Ambulatory Visit (INDEPENDENT_AMBULATORY_CARE_PROVIDER_SITE_OTHER): Payer: Self-pay

## 2022-02-17 ENCOUNTER — Encounter: Payer: Self-pay | Admitting: *Deleted

## 2022-02-17 VITALS — BP 155/74 | HR 65 | Temp 98.1°F | Ht 59.0 in | Wt 139.0 lb

## 2022-02-17 DIAGNOSIS — K589 Irritable bowel syndrome without diarrhea: Secondary | ICD-10-CM | POA: Insufficient documentation

## 2022-02-17 DIAGNOSIS — K642 Third degree hemorrhoids: Secondary | ICD-10-CM | POA: Diagnosis not present

## 2022-02-17 DIAGNOSIS — K3184 Gastroparesis: Secondary | ICD-10-CM

## 2022-02-17 DIAGNOSIS — K581 Irritable bowel syndrome with constipation: Secondary | ICD-10-CM

## 2022-02-17 DIAGNOSIS — R1310 Dysphagia, unspecified: Secondary | ICD-10-CM | POA: Diagnosis not present

## 2022-02-17 DIAGNOSIS — K219 Gastro-esophageal reflux disease without esophagitis: Secondary | ICD-10-CM

## 2022-02-17 DIAGNOSIS — K220 Achalasia of cardia: Secondary | ICD-10-CM

## 2022-02-17 DIAGNOSIS — E1143 Type 2 diabetes mellitus with diabetic autonomic (poly)neuropathy: Secondary | ICD-10-CM

## 2022-02-17 MED ORDER — DICYCLOMINE HCL 10 MG PO CAPS: 10.0000 mg | ORAL_CAPSULE | Freq: Two times a day (BID) | ORAL | 2 refills | Status: AC | PRN

## 2022-02-17 MED ORDER — FAMOTIDINE 40 MG PO TABS: ORAL_TABLET | ORAL | 3 refills | Status: DC

## 2022-02-17 NOTE — Progress Notes (Signed)
Maylon Peppers, M.D. Gastroenterology & Hepatology [facility] [address] [place], [place] [address]  Primary Care Physician: Remi Haggard, [place] [facility] [address]  I will communicate my assessment and recommendations to the referring MD via EMR.  Problems: Diabetic gastroparesis Chronic idiopathic constipation Hemorrhoids status post banding Achalasia s/p Heller myotomy Choking sensation  History of Present Illness: [name] is a 81 y.o. female with past medical history of anxiety, diabetes, diabetic gastroparesis, fibromyalgia, chronic idiopathic constipation, GERD, TIA, achalasia status post Heller myotomy, history of colorectal cancer status post resection in 2010, who presents for follow up of abdominal discomfort and choking episodes.  The patient was last seen on 10/15/2021. At that time, the patient was prescribed metronidazole for 10 days for management of small bowel intestinal bacterial overgrowth.  Patient reports that she has felt worsening "choking episodes after eating". However, she also reports that she may have these episodes even at night when not eating. Sometimes she feels a burning sensation that goes to her throat frequently. She takes Dexilant in the morning after eating breakfast and she also takes Pepcid before eating.  She reports a sensation of being "uncomfortable in her abdomen" diffusely.  She denies having any abdominal distention but feels discomfort frequently. She was taking Linzess in the past for constipation in the past but had diarrhea so stopped taking the medication. She is currently taking Miralax 1-2 capfuls per day. With this she has at least one BM per day.  The patient denies having any nausea, vomiting, fever, chills, hematochezia, melena, hematemesis,  diarrhea, jaundice, pruritus or weight loss.  She is still presenting some bleeding from her hemorrhoids  but is less frequent than prior.  Takes oxycodone once a month, has decreased the intake to the medication as she does not want to become dependent on the medicine.  Last EGD: 01/2021 - Normal proximal esophagus. - Esophageal plaques were found, suspicious for candidiasis. Cells for cytology obtained -consistent with Candida. - Abnormal (rule out Barrett's esophagus) mucosa in the esophagus.  Biopsies were consistent with reflux - Z-line irregular, 33 cm from the incisors. - Small hyperplastic appearing polyp in gastric fundus. Was left alone. - Gastritis. Biopsied, negative for H. pylori or intestinal metaplasia. - A fundoplication was found. The wrap appears loose. - Normal duodenal bulb and second portion of the duodenum.  Last Colonoscopy: 01/2021 - Perianal skin tags found on perianal exam. - The examined portion of the ileum was normal. - Diverticulosis in the transverse colon. - Two 4 to 6 mm polyps, removed with a cold snare. Resected and retrieved.  Pathology showed tubular adenomas x2 - External hemorrhoids.  Recommendation to repeat colonoscopy in 5 years  Past Medical History: Past Medical History:  Diagnosis Date   Anemia    Anxiety    Arthritis    "all over" (04/19/2013)   Asthma    Bleeding stomach ulcer 02/08/1979   Bloating    Chronic back pain    Chronic headaches    Chronic heartburn    Chronic neck pain    Chronic pain    Colon cancer (HCC)    DDD (degenerative disc disease), lumbar    Diabetic peripheral neuropathy ([place])    "in my feet" (04/19/2013)   Dizziness    Dysphagia    Family history of anesthesia complication    "daughter has PONV too" (04/19/2013)   Fibromyalgia    Gastroesophageal reflux    Gastroparesis  Glaucoma, bilateral    Hiatal hernia    "had it before; had OR; got it again" (04/19/2013)   History of blood transfusion 02/08/1979   "w/bleeding stomach ulcer" (04/19/2013)   History of kidney stones    Migraine    "take RX  for it qd" (04/19/2013)   Nausea    Pneumonia 04/09/2012   PONV (postoperative nausea and vomiting)    Renal insufficiency    TIA (transient ischemic attack)    "3-4 before starting RX; none since" (04/19/2013)   Type II diabetes mellitus ([place])    Walking pneumonia ~ 1966    Past Surgical History: Past Surgical History:  Procedure Laterality Date   ABDOMINAL ADHESION SURGERY  ~ 2012   "repaired wrap where they did hiatal hernia OR too" 911/04/2013)   ANTERIOR CERVICAL DISCECTOMY  ~ 2009   "only cleaned out arthritis and spurs" (04/19/2013)   BIOPSY  01/09/2021   Procedure: BIOPSY;  Surgeon: Rogene Houston, MD;  [facility];  Service: Endoscopy;;  antrum; distal esophagus;   CATARACT EXTRACTION W/ INTRAOCULAR LENS IMPLANT Left 04/13/2013   CHOLECYSTECTOMY  1980's   COLON SURGERY     COLONOSCOPY  10/11/2010   COLONOSCOPY  11/23/2009   COLONOSCOPY  07/28/2008   W/SNARE   COLONOSCOPY  06/28/07   COLONOSCOPY  05/10/07   W/POLYP   COLONOSCOPY  12/28/00   COLONOSCOPY N/A 06/29/2015   Procedure: COLONOSCOPY;  Surgeon: Rogene Houston, MD;  [facility];  Service: Endoscopy;  Laterality: N/A;  135   COLONOSCOPY WITH ESOPHAGOGASTRODUODENOSCOPY (EGD) N/A 05/13/2013   Procedure: COLONOSCOPY WITH ESOPHAGOGASTRODUODENOSCOPY (EGD);  Surgeon: Rogene Houston, MD;  [facility];  Service: Endoscopy;  Laterality: N/A;  855   COLONOSCOPY WITH PROPOFOL N/A 01/09/2021   Procedure: COLONOSCOPY WITH PROPOFOL;  Surgeon: Rogene Houston, MD;  [facility];  Service: Endoscopy;  Laterality: N/A;  12:50   ESOPHAGOGASTRODUODENOSCOPY (EGD) WITH PROPOFOL N/A 10/06/2018   Procedure: ESOPHAGOGASTRODUODENOSCOPY (EGD) WITH PROPOFOL;  Surgeon: Rogene Houston, MD;  [facility];  Service: Endoscopy;  Laterality: N/A;   ESOPHAGOGASTRODUODENOSCOPY (EGD) WITH PROPOFOL N/A 01/09/2021   Procedure: ESOPHAGOGASTRODUODENOSCOPY (EGD) WITH PROPOFOL;  Surgeon: Rogene Houston, MD;  [facility];  Service: Endoscopy;  Laterality: N/A;   HEMICOLECTOMY  2010   ZIEGLER   HIATAL HERNIA REPAIR  1970's   LEFT HEART CATHETERIZATION WITH CORONARY ANGIOGRAM N/A 04/19/2013   Procedure: LEFT HEART CATHETERIZATION WITH CORONARY ANGIOGRAM;  Surgeon: Peter M Martinique, MD;  Location: [facility];  Service: Cardiovascular;  Laterality: N/A;   POLYPECTOMY  10/06/2018   Procedure: POLYPECTOMY;  Surgeon: Rogene Houston, MD;  [facility];  Service: Endoscopy;;  gastric   POLYPECTOMY  01/09/2021   Procedure: POLYPECTOMY;  Surgeon: Rogene Houston, MD;  [facility];  Service: Endoscopy;;  transverse;splenic flexure   [place]   "had a broken neck" (04/19/2013)   TONSILLECTOMY  ~ [place] Bilateral 12/04/2020   Procedure: BILATERAL TEMPOROMANDIBULAR JOINT ARTHROCENTESIS; DENTAL EXTRACTION TEETH #3,6,7,8,9,10,11,12,19,23,24,25,26,32 WITH ALVEOLOPLASTY;  Surgeon: Diona Browner, DMD;  Location: [facility] [place];  Service: Oral Surgery;  Laterality: Bilateral;   UPPER GASTROINTESTINAL ENDOSCOPY  10/11/2010   EGD ED   UPPER GASTROINTESTINAL ENDOSCOPY  11/23/2009   UPPER GASTROINTESTINAL ENDOSCOPY  05/10/07   EGD ED   UPPER GASTROINTESTINAL ENDOSCOPY  08/11/01   EGD ED   VAGINAL HYSTERECTOMY  Family History: Family History  Problem Relation Age of Onset   Heart disease Mother    Diabetes Mother    Dementia Father    Healthy Sister    Diabetes Brother    Kidney cancer Brother    Diabetes Brother    Neuropathy Brother    Diabetes Daughter    Diabetes Daughter    Diabetes Son     Social History: Social History   Tobacco Use  Smoking Status Former   Packs/day: 1.50   Years: 15.00   Total pack years: 22.50   Types: Cigarettes   Quit date: 03/17/1991   Years since quitting: 30.9   Passive exposure: Past  Smokeless Tobacco Never  Tobacco Comments   Patient states that it has ben greater than 20  years since she quit smoking   Social History   Substance and Sexual Activity  Alcohol Use No   Alcohol/week: 0.0 standard drinks of alcohol   Social History   Substance and Sexual Activity  Drug Use No    Allergies: Allergies  Allergen Reactions   Botox [Onabotulinumtoxina]     Patient states that it became loose in her stomach , and it caused multiple problems.   Morphine Anaphylaxis and Other (See Comments)    "it will kill me" "made me stop breathing"   Aspirin Other (See Comments)    Gi bleed    Cefuroxime Axetil Nausea And Vomiting   Codeine Nausea And Vomiting   Diltiazem Nausea And Vomiting   Dronabinol Nausea And Vomiting and Palpitations   Shellfish Allergy Other (See Comments)    Blood sugar drops   Other     Mold and trees   Ondansetron Hives   Penicillins Rash    Did it involve swelling of the face/tongue/throat, SOB, or low BP? No Did it involve sudden or severe rash/hives, skin peeling, or any reaction on the inside of your mouth or nose? Yes Did you need to seek medical attention at a hospital or doctor's office? Yes When did it last happen?  Over 10 years  If all above answers are "NO", may proceed with cephalosporin use.     Medications: Current Outpatient Medications  Medication Sig Dispense Refill   acetaminophen (TYLENOL) 500 MG tablet Take 500 mg by mouth every 6 (six) hours as needed for mild pain.      ALPRAZolam (XANAX XR) 1 MG 24 hr tablet Take 0.5-1 mg by mouth as needed for sleep (Anxiety).      Dapagliflozin Propanediol (FARXIGA PO) daily at 6 (six) AM.     dexlansoprazole (DEXILANT) 60 MG capsule Take 1 capsule (60 mg total) by mouth daily before breakfast. 90 capsule 3   DULoxetine (CYMBALTA) 60 MG capsule Take 60 mg by mouth every morning.      famotidine (PEPCID) 40 MG tablet Take 1 tablet (40 mg total) by mouth at bedtime. 90 tablet 3   hydrocortisone (ANUSOL-HC) 2.5 % rectal cream APPLY 1 APPLICATION RECTALLY TWICE DAILY. 30 g 1    lisinopril (ZESTRIL) 2.5 MG tablet Take 2.5 mg by mouth daily.     loperamide (IMODIUM) 2 MG capsule Take 1 capsule (2 mg total) by mouth 2 (two) times daily as needed for diarrhea or loose stools.     Multiple Vitamin (MULTIVITAMIN WITH MINERALS) TABS tablet Take 1 tablet by mouth daily.     oxyCODONE-acetaminophen (PERCOCET) 7.5-325 MG tablet Take 1 tablet by mouth every 4 (four) hours as needed for severe pain.  polyvinyl alcohol (LIQUIFILM TEARS) 1.4 % ophthalmic solution Place 1 drop into both eyes 3 (three) times daily as needed for dry eyes.     silodosin (RAPAFLO) 8 MG CAPS capsule Take 8 mg by mouth at bedtime.      topiramate (TOPAMAX) 100 MG tablet Take 1 tablet (100 mg total) by mouth 2 (two) times daily. 60 tablet 5   Travoprost, BAK Free, (TRAVATAN) 0.004 % SOLN ophthalmic solution Place 1 drop into both eyes at bedtime.      No current facility-administered medications for this visit.    Review of Systems: GENERAL: negative for malaise, night sweats HEENT: No changes in hearing or vision, no nose bleeds or other nasal problems. NECK: Negative for lumps, goiter, pain and significant neck swelling RESPIRATORY: Negative for cough, wheezing CARDIOVASCULAR: Negative for chest pain, leg swelling, palpitations, orthopnea GI: SEE HPI MUSCULOSKELETAL: Negative for joint pain or swelling, back pain, and muscle pain. SKIN: Negative for lesions, rash PSYCH: Negative for sleep disturbance, mood disorder and recent psychosocial stressors. HEMATOLOGY Negative for prolonged bleeding, bruising easily, and swollen nodes. ENDOCRINE: Negative for cold or heat intolerance, polyuria, polydipsia and goiter. NEURO: negative for tremor, gait imbalance, syncope and seizures. The remainder of the review of systems is noncontributory.   Physical Exam: BP (!) 155/74 (BP Location: Left Arm, Patient Position: Sitting, Cuff Size: Large)   Pulse 65   Temp 98.1 F (36.7 C) (Oral)   Ht '4\' 11"'$  (1.499  m)   Wt 139 lb (63 kg)   BMI 28.07 kg/m  GENERAL: The patient is AO x3, in no acute distress. HEENT: Head is normocephalic and atraumatic. EOMI are intact. Mouth is well hydrated and without lesions. NECK: Supple. No masses LUNGS: Clear to auscultation. No presence of rhonchi/wheezing/rales. Adequate chest expansion HEART: RRR, normal s1 and s2. ABDOMEN: Soft, nontender, no guarding, no peritoneal signs, and nondistended. BS +. No masses. EXTREMITIES: Without any cyanosis, clubbing, rash, lesions or edema. NEUROLOGIC: AOx3, no focal motor deficit. SKIN: no jaundice, no rashes  Imaging/Labs: as above  I personally reviewed and interpreted the available labs, imaging and endoscopic files.  Impression and Plan: [name] is a 81 y.o. female with past medical history of anxiety, diabetes, diabetic gastroparesis, fibromyalgia, chronic idiopathic constipation, GERD, TIA, achalasia status post Heller myotomy, history of colorectal cancer status post resection in 2010, who presents for follow up of abdominal discomfort and choking episodes.  The patient states that she has presented recurrent episodes of "choking" and what seems to be regurgitation events.  It is not clear if she has indeed presented some dysphagia concomitantly but part of her symptoms could be related to ongoing GERD.  We will evaluate her symptoms further with an esophagram.  She has not been taking the medication adequately so I advised her to take the PPI and famotidine at least 30 minutes before her meals.  She should continue taking the Dexilant at 60 mg dosing in the morning and Pepcid 40 mg at night.    As she has had endoscopic investigations in the past that have been unremarkable for any intra-abdominal pathology explaining her abdominal discomfort, she can take Bentyl as needed for now to alleviate her symptoms.  She may also benefit from continuing her MiraLAX to decrease stool burden.  She will also benefit from  eating smaller portions through the day.  Finally, she has presented some recurrent episodes of rectal bleeding.  Given her history of hemorrhoids, she will be referred back for  her [place] banding.  - Continue Miralax 1-2 capfuls per day - Start Bentyl 1 tablet q12h as needed for abdominal discomfort Continue Dexilant in AM and Pepcid at night. - Explained presumed etiology of reflux symptoms. Instruction provided in the use of antireflux medication - patient should take medication in the morning 30-45 minutes before eating breakfast and supper. Discussed avoidance of eating within 2 hours of lying down to sleep and benefit of blocks to elevate head of bed. Also, will benefit from avoiding carbonated drinks/sodas or food that has tomatoes, spicy or greasy food. -Schedule barium esophagram with pill - Eat smaller portions multiple times per day - Will refer back to Roseanne Kaufman for hemorrhoid banding.  All questions were answered.      Harvel Quale, MD Gastroenterology and Hepatology [facility]

## 2022-02-17 NOTE — Patient Instructions (Addendum)
Continue Miralax 1-2 capfuls per day Start Bentyl 1 tablet q12h as needed for abdominal discomfort Continue Dexilant in AM and Pepcid at night. - Explained presumed etiology of reflux symptoms. Instruction provided in the use of antireflux medication - patient should take medication in the morning 30-45 minutes before eating breakfast and supper. Discussed avoidance of eating within 2 hours of lying down to sleep and benefit of blocks to elevate head of bed. Also, will benefit from avoiding carbonated drinks/sodas or food that has tomatoes, spicy or greasy food. Schedule barium esophagram with pill Eat smaller portions multiple times per day Will refer back to Roseanne Kaufman for hemorrhoid banding.

## 2022-02-19 ENCOUNTER — Encounter (INDEPENDENT_AMBULATORY_CARE_PROVIDER_SITE_OTHER): Payer: Self-pay | Admitting: *Deleted

## 2022-02-19 ENCOUNTER — Ambulatory Visit (HOSPITAL_COMMUNITY)
Admission: RE | Admit: 2022-02-19 | Discharge: 2022-02-19 | Disposition: A | Discharge status: Home / Self Care | Payer: Medicare Other | Source: Ambulatory Visit | Attending: Gastroenterology | Admitting: Gastroenterology

## 2022-02-19 DIAGNOSIS — R1310 Dysphagia, unspecified: Secondary | ICD-10-CM | POA: Diagnosis present

## 2022-02-20 ENCOUNTER — Telehealth (INDEPENDENT_AMBULATORY_CARE_PROVIDER_SITE_OTHER): Payer: Self-pay | Admitting: *Deleted

## 2022-02-20 NOTE — Telephone Encounter (Signed)
I received a voicemail from pt's daughter [name] stating she is returning a call for [name]. She is not on pt's release of information so I called the patient and spoke with the patient about missed call from Dr. Jenetta Downer. I let her know she would receive a letter in the mail with results since its already been sent out but let her know that Dr. Jenetta Downer tried to call her with results and I discussed with her the results  per Dr. Jenetta Downer - I called the patient to inform about the results of recent esophagram but could not get in touch with her, I tried to leave a detailed voice message with the findings but  her voicemail was full.   Ann, can you please send her a letter with the findings? Esophagram showed presence of dysmotility, recurrent hiatal hernia, questionable nodule in the stomach (likely a polyp as she underwent EGD last year).  If still presenting symptoms despite changing the way she takes her medication, may need to proceed with a repeat EGD and possible esophageal manometry in the future.   Patient verbalized understanding and will let us know if symptoms not improving with medication change or if getting worse.

## 2022-03-25 ENCOUNTER — Encounter: Payer: Medicare Other | Admitting: Gastroenterology

## 2022-05-29 ENCOUNTER — Encounter (INDEPENDENT_AMBULATORY_CARE_PROVIDER_SITE_OTHER): Payer: Self-pay | Admitting: Gastroenterology

## 2022-06-19 ENCOUNTER — Ambulatory Visit (INDEPENDENT_AMBULATORY_CARE_PROVIDER_SITE_OTHER): Payer: Medicare Other | Admitting: Gastroenterology

## 2022-07-14 ENCOUNTER — Other Ambulatory Visit (INDEPENDENT_AMBULATORY_CARE_PROVIDER_SITE_OTHER): Payer: Self-pay | Admitting: *Deleted

## 2022-07-14 DIAGNOSIS — K2210 Ulcer of esophagus without bleeding: Secondary | ICD-10-CM

## 2022-07-14 MED ORDER — DEXLANSOPRAZOLE 60 MG PO CPDR: 60.0000 mg | DELAYED_RELEASE_CAPSULE | Freq: Every day | ORAL | 0 refills | Status: DC

## 2022-07-31 ENCOUNTER — Encounter (INDEPENDENT_AMBULATORY_CARE_PROVIDER_SITE_OTHER): Payer: Self-pay

## 2022-07-31 ENCOUNTER — Ambulatory Visit (INDEPENDENT_AMBULATORY_CARE_PROVIDER_SITE_OTHER): Payer: Medicare Other | Admitting: Gastroenterology

## 2022-07-31 ENCOUNTER — Encounter (INDEPENDENT_AMBULATORY_CARE_PROVIDER_SITE_OTHER): Payer: Self-pay | Admitting: Gastroenterology

## 2022-07-31 VITALS — BP 152/72 | HR 53 | Temp 97.3°F | Ht 59.0 in | Wt 137.5 lb

## 2022-07-31 DIAGNOSIS — K581 Irritable bowel syndrome with constipation: Secondary | ICD-10-CM | POA: Diagnosis not present

## 2022-07-31 DIAGNOSIS — R1310 Dysphagia, unspecified: Secondary | ICD-10-CM

## 2022-07-31 DIAGNOSIS — E1143 Type 2 diabetes mellitus with diabetic autonomic (poly)neuropathy: Secondary | ICD-10-CM | POA: Diagnosis not present

## 2022-07-31 DIAGNOSIS — K220 Achalasia of cardia: Secondary | ICD-10-CM

## 2022-07-31 DIAGNOSIS — K3184 Gastroparesis: Secondary | ICD-10-CM

## 2022-07-31 MED ORDER — LINACLOTIDE 72 MCG PO CAPS: 72.0000 ug | ORAL_CAPSULE | Freq: Every day | ORAL | 3 refills | Status: DC

## 2022-07-31 NOTE — Patient Instructions (Addendum)
Schedule timed barium esophagram with pill - achalasia protocol Referral for esophageal manometry and pH impedance testing on PPI - stop opiates (Percocet) a month prior to the test Can stop Pepcid if not feeling improvement with it Continue Dexilant 60 mg qday Start Linzess 72 mcg qday The patient was found to have elevated blood pressure when vital signs were checked in the office. The blood pressure was rechecked by the nursing staff and it was found be persistently elevated >140/90 mmHg. I personally advised to the patient to follow up closely with PCP for hypertension control.

## 2022-07-31 NOTE — Progress Notes (Signed)
Maylon Peppers, M.D. Gastroenterology & Hepatology [facility] [address] [place], [place] [address]  Primary Care Physician: Remi Haggard, [place] [id number]  I will communicate my assessment and recommendations to the referring MD via EMR.  Problems: Diabetic gastroparesis Chronic idiopathic constipation Hemorrhoids status post banding Achalasia s/p Heller myotomy  History of Present Illness: [name] is a 82 y.o. female  with past medical history of anxiety, diabetes, diabetic gastroparesis, fibromyalgia, chronic idiopathic constipation, GERD, TIA, achalasia status post Heller myotomy, history of colorectal cancer status post resection in 2010,   who presents for follow up of constipation heartburn and dysphagia.  The patient was last seen on 02/17/2022. At that time, the patient was advised to continue MiraLAX for constipation and to take Bentyl for abdominal pain episodes.  Also advised to continue Dexilant in the morning and Pepcid at night as well as during small portions during the day.  An esophagram was performed which showed generalized motility and questionable nodule in the stomach.  Patient reports that she has presented persistent episodes of feeling food -both liquids and solids get stuck in her chest. She denies any cough when eating. No odynophagia. No vomiting of the food. She is having heartburn on a daily basis, despite takin Dexilant 60 mg every day and Pepcid - states Pepcid has not helped at all.  She had a Heller myotomy in 2013 at [facility].  She reports that after this procedure she had improvement of her symptoms for some years the choking episodes came back few years later and have worsened since then.  She is taking Clearlax 1-2 capful every day but this does not lead to significant improvement. She is possibly having a BM possibly 3 days a week. She was  prescribed Linzess 72 mcg qday in the past by her PCP - states was having Bms almost on a daily basis but ran out her prescription, so she has presented recurrent constipation since then.  Taking Percocet every 10 days for some occasional pain episodes but she tries to avoid taking this unless history necessary.  The patient denies having any nausea, vomiting, fever, chills, hematochezia, melena, hematemesis, abdominal distention, abdominal pain, diarrhea, jaundice, pruritus or weight loss.  Last EGD: 01/2021 - Normal proximal esophagus. - Esophageal plaques were found, suspicious for candidiasis. Cells for cytology obtained -consistent with Candida. - Abnormal (rule out Barrett's esophagus) mucosa in the esophagus.  Biopsies were consistent with reflux - Z-line irregular, 33 cm from the incisors. - Small hyperplastic appearing polyp in gastric fundus. Was left alone. - Gastritis. Biopsied, negative for H. pylori or intestinal metaplasia. - A fundoplication was found. The wrap appears loose. - Normal duodenal bulb and second portion of the duodenum.   Last Colonoscopy: 01/2021 - Perianal skin tags found on perianal exam. - The examined portion of the ileum was normal. - Diverticulosis in the transverse colon. - Two 4 to 6 mm polyps, removed with a cold snare. Resected and retrieved.  Pathology showed tubular adenomas x2 - External hemorrhoids.   Recommendation to repeat colonoscopy in 5 years  Past Medical History: Past Medical History:  Diagnosis Date   Anemia    Anxiety    Arthritis    "all over" (04/19/2013)   Asthma    Bleeding stomach ulcer 02/08/1979   Bloating    Chronic back pain    Chronic headaches    Chronic heartburn    Chronic  neck pain    Chronic pain    Colon cancer (HCC)    DDD (degenerative disc disease), lumbar    Diabetic peripheral neuropathy ([place])    "in my feet" (04/19/2013)   Dizziness    Dysphagia    Family history of anesthesia complication     "daughter has PONV too" (04/19/2013)   Fibromyalgia    Gastroesophageal reflux    Gastroparesis    Glaucoma, bilateral    Hiatal hernia    "had it before; had OR; got it again" (04/19/2013)   History of blood transfusion 02/08/1979   "w/bleeding stomach ulcer" (04/19/2013)   History of kidney stones    Migraine    "take RX for it qd" (04/19/2013)   Nausea    Pneumonia 04/09/2012   PONV (postoperative nausea and vomiting)    Renal insufficiency    TIA (transient ischemic attack)    "3-4 before starting RX; none since" (04/19/2013)   Type II diabetes mellitus ([place])    Walking pneumonia ~ 1966    Past Surgical History: Past Surgical History:  Procedure Laterality Date   ABDOMINAL ADHESION SURGERY  ~ 2012   "repaired wrap where they did hiatal hernia OR too" 911/04/2013)   ANTERIOR CERVICAL DISCECTOMY  ~ 2009   "only cleaned out arthritis and spurs" (04/19/2013)   BIOPSY  01/09/2021   Procedure: BIOPSY;  Surgeon: Rogene Houston, MD;  [facility];  Service: Endoscopy;;  antrum; distal esophagus;   CATARACT EXTRACTION W/ INTRAOCULAR LENS IMPLANT Left 04/13/2013   CHOLECYSTECTOMY  1980's   COLON SURGERY     COLONOSCOPY  10/11/2010   COLONOSCOPY  11/23/2009   COLONOSCOPY  07/28/2008   W/SNARE   COLONOSCOPY  06/28/07   COLONOSCOPY  05/10/07   W/POLYP   COLONOSCOPY  12/28/00   COLONOSCOPY N/A 06/29/2015   Procedure: COLONOSCOPY;  Surgeon: Rogene Houston, MD;  [facility];  Service: Endoscopy;  Laterality: N/A;  135   COLONOSCOPY WITH ESOPHAGOGASTRODUODENOSCOPY (EGD) N/A 05/13/2013   Procedure: COLONOSCOPY WITH ESOPHAGOGASTRODUODENOSCOPY (EGD);  Surgeon: Rogene Houston, MD;  [facility];  Service: Endoscopy;  Laterality: N/A;  855   COLONOSCOPY WITH PROPOFOL N/A 01/09/2021   Procedure: COLONOSCOPY WITH PROPOFOL;  Surgeon: Rogene Houston, MD;  [facility];  Service: Endoscopy;  Laterality: N/A;  12:50   ESOPHAGOGASTRODUODENOSCOPY  (EGD) WITH PROPOFOL N/A 10/06/2018   Procedure: ESOPHAGOGASTRODUODENOSCOPY (EGD) WITH PROPOFOL;  Surgeon: Rogene Houston, MD;  [facility];  Service: Endoscopy;  Laterality: N/A;   ESOPHAGOGASTRODUODENOSCOPY (EGD) WITH PROPOFOL N/A 01/09/2021   Procedure: ESOPHAGOGASTRODUODENOSCOPY (EGD) WITH PROPOFOL;  Surgeon: Rogene Houston, MD;  [facility];  Service: Endoscopy;  Laterality: N/A;   HEMICOLECTOMY  2010   ZIEGLER   HIATAL HERNIA REPAIR  1970's   LEFT HEART CATHETERIZATION WITH CORONARY ANGIOGRAM N/A 04/19/2013   Procedure: LEFT HEART CATHETERIZATION WITH CORONARY ANGIOGRAM;  Surgeon: Peter M Martinique, MD;  Location: [facility] CATH [facility];  Service: Cardiovascular;  Laterality: N/A;   POLYPECTOMY  10/06/2018   Procedure: POLYPECTOMY;  Surgeon: Rogene Houston, MD;  [facility];  Service: Endoscopy;;  gastric   POLYPECTOMY  01/09/2021   Procedure: POLYPECTOMY;  Surgeon: Rogene Houston, MD;  [facility];  Service: Endoscopy;;  transverse;splenic flexure   [place]   "had a broken neck" (04/19/2013)   TONSILLECTOMY  ~ [place] Bilateral 12/04/2020  Procedure: BILATERAL TEMPOROMANDIBULAR JOINT ARTHROCENTESIS; DENTAL EXTRACTION TEETH #3,6,7,8,9,10,11,12,19,23,24,25,26,32 WITH ALVEOLOPLASTY;  Surgeon: Diona Browner, DMD;  Location: [facility];  Service: Oral Surgery;  Laterality: Bilateral;   UPPER GASTROINTESTINAL ENDOSCOPY  10/11/2010   EGD ED   UPPER GASTROINTESTINAL ENDOSCOPY  11/23/2009   UPPER GASTROINTESTINAL ENDOSCOPY  05/10/07   EGD ED   UPPER GASTROINTESTINAL ENDOSCOPY  08/11/01   EGD ED   VAGINAL HYSTERECTOMY      Family History: Family History  Problem Relation Age of Onset   Heart disease Mother    Diabetes Mother    Dementia Father    Healthy Sister    Diabetes Brother    Kidney cancer Brother    Diabetes Brother    Neuropathy Brother    Diabetes Daughter    Diabetes Daughter    Diabetes Son      Social History: Social History   Tobacco Use  Smoking Status Former   Packs/day: 1.50   Years: 15.00   Total pack years: 22.50   Types: Cigarettes   Quit date: 03/17/1991   Years since quitting: 31.3   Passive exposure: Past  Smokeless Tobacco Never  Tobacco Comments   Patient states that it has ben greater than 20 years since she quit smoking   Social History   Substance and Sexual Activity  Alcohol Use No   Alcohol/week: 0.0 standard drinks of alcohol   Social History   Substance and Sexual Activity  Drug Use No    Allergies: Allergies  Allergen Reactions   Botox [Onabotulinumtoxina]     Patient states that it became loose in her stomach , and it caused multiple problems.   Morphine Anaphylaxis and Other (See Comments)    "it will kill me" "made me stop breathing"   Aspirin Other (See Comments)    Gi bleed    Cefuroxime Axetil Nausea And Vomiting   Codeine Nausea And Vomiting   Diltiazem Nausea And Vomiting   Dronabinol Nausea And Vomiting and Palpitations   Shellfish Allergy Other (See Comments)    Blood sugar drops   Other     Mold and trees   Ondansetron Hives   Penicillins Rash    Did it involve swelling of the face/tongue/throat, SOB, or low BP? No Did it involve sudden or severe rash/hives, skin peeling, or any reaction on the inside of your mouth or nose? Yes Did you need to seek medical attention at a hospital or doctor's office? Yes When did it last happen?  Over 10 years  If all above answers are "NO", may proceed with cephalosporin use.     Medications: Current Outpatient Medications  Medication Sig Dispense Refill   acetaminophen (TYLENOL) 500 MG tablet Take 500 mg by mouth every 6 (six) hours as needed for mild pain.      ALPRAZolam (XANAX XR) 1 MG 24 hr tablet Take 0.5-1 mg by mouth as needed for sleep (Anxiety).      Dapagliflozin Propanediol (FARXIGA PO) daily at 6 (six) AM.     dexlansoprazole (DEXILANT) 60 MG capsule Take 1  capsule (60 mg total) by mouth daily before breakfast. 90 capsule 0   dicyclomine (BENTYL) 10 MG capsule Take 1 capsule (10 mg total) by mouth every 12 (twelve) hours as needed for spasms (abdominal pain/discomfort). 60 capsule 2   DULoxetine (CYMBALTA) 60 MG capsule Take 60 mg by mouth every morning.      Erenumab-aooe (AIMOVIG [place]) Inject into the skin every 30 (thirty) days.  Evolocumab (REPATHA [place]) Inject into the skin. Every two weeks.     famotidine (PEPCID) 40 MG tablet Take one po 30-45 minutes prior to supper. 90 tablet 3   hydrocortisone (ANUSOL-HC) 2.5 % rectal cream APPLY 1 APPLICATION RECTALLY TWICE DAILY. 30 g 1   lisinopril (ZESTRIL) 2.5 MG tablet Take 2.5 mg by mouth daily.     loperamide (IMODIUM) 2 MG capsule Take 1 capsule (2 mg total) by mouth 2 (two) times daily as needed for diarrhea or loose stools.     Multiple Vitamin (MULTIVITAMIN WITH MINERALS) TABS tablet Take 1 tablet by mouth daily.     OVER THE COUNTER MEDICATION Insulin per patient Maybe Humalog 15 units every am. (They will call back with name)     oxyCODONE-acetaminophen (PERCOCET) 7.5-325 MG tablet Take 1 tablet by mouth every 4 (four) hours as needed for severe pain.     polyvinyl alcohol (LIQUIFILM TEARS) 1.4 % ophthalmic solution Place 1 drop into both eyes 3 (three) times daily as needed for dry eyes.     silodosin (RAPAFLO) 8 MG CAPS capsule Take 8 mg by mouth at bedtime.      topiramate (TOPAMAX) 100 MG tablet Take 1 tablet (100 mg total) by mouth 2 (two) times daily. 60 tablet 5   Travoprost, BAK Free, (TRAVATAN) 0.004 % SOLN ophthalmic solution Place 1 drop into both eyes at bedtime.      No current facility-administered medications for this visit.    Review of Systems: GENERAL: negative for malaise, night sweats HEENT: No changes in hearing or vision, no nose bleeds or other nasal problems. NECK: Negative for lumps, goiter, pain and significant neck swelling RESPIRATORY: Negative for cough,  wheezing CARDIOVASCULAR: Negative for chest pain, leg swelling, palpitations, orthopnea GI: SEE HPI MUSCULOSKELETAL: Negative for joint pain or swelling, back pain, and muscle pain. SKIN: Negative for lesions, rash PSYCH: Negative for sleep disturbance, mood disorder and recent psychosocial stressors. HEMATOLOGY Negative for prolonged bleeding, bruising easily, and swollen nodes. ENDOCRINE: Negative for cold or heat intolerance, polyuria, polydipsia and goiter. NEURO: negative for tremor, gait imbalance, syncope and seizures. The remainder of the review of systems is noncontributory.   Physical Exam: BP (!) 152/72 (BP Location: Left Arm, Patient Position: Sitting, Cuff Size: Normal)   Pulse (!) 53   Temp (!) 97.3 F (36.3 C) (Temporal)   Ht 4' 11"$  (1.499 m)   Wt 137 lb 8 oz (62.4 kg)   BMI 27.77 kg/m  GENERAL: The patient is AO x3, in no acute distress. HEENT: Head is normocephalic and atraumatic. EOMI are intact. Mouth is well hydrated and without lesions. NECK: Supple. No masses LUNGS: Clear to auscultation. No presence of rhonchi/wheezing/rales. Adequate chest expansion HEART: RRR, normal s1 and s2. ABDOMEN: Soft, nontender, no guarding, no peritoneal signs, and nondistended. BS +. No masses. EXTREMITIES: Without any cyanosis, clubbing, rash, lesions or edema. NEUROLOGIC: AOx3, no focal motor deficit. SKIN: no jaundice, no rashes  Imaging/Labs: as above  I personally reviewed and interpreted the available labs, imaging and endoscopic files.  Impression and Plan: [name] is a 82 y.o. female  with past medical history of anxiety, diabetes, diabetic gastroparesis, fibromyalgia, chronic idiopathic constipation, GERD, TIA, achalasia status post Heller myotomy, history of colorectal cancer status post resection in 2010,   who presents for follow up of constipation heartburn and dysphagia.  The patient has presented recurrent episodes of dysphagia/choking that have not  improved with dietary modifications.  Had a previous esophagram that  showed changes suggestive of dysmotility but these findings were nondiagnostic.  She had previous Heller myotomy for management of achalasia that led to improvement of her symptoms transiently but the symptoms have recurred.  Had a thorough discussion with the patient and the daughters regarding the possibility that her symptoms are related to her achalasia, which may require a repeat evaluation with repeat barium esophagram and an esophageal manometry.  Will also proceed with a pH impedance testing on PPI as she is presenting recurrent heartburn -this could be related to GERD or , if achalasia is present, could be related to food stasis in her esophagus.  For now she should continue with Dexilant 60 mg every day.  She has not presented significant improvement with Pepcid she can stop this.  In terms of her constipation, she has presented recurrent episodes of constipation that actually improved with the use of low-dose Linzess.  She may have generalized dysmotility in her bowels, we will restart her on the medication today.  The patient was found to have elevated blood pressure when vital signs were checked in the office. The blood pressure was rechecked by the nursing staff and it was found be persistently elevated >140/90 mmHg. I personally advised to the patient to follow up closely with PCP for hypertension control.  - Schedule timed barium esophagram with pill - achalasia protocol - Referral for esophageal manometry and pH impedance testing on PPI - should stop opiates (Percocet) a month prior to the test -Can stop Pepcid if not feeling improvement with it -Continue Dexilant 60 mg qday -Start Linzess 72 mcg qday  All questions were answered.      Maylon Peppers, MD Gastroenterology and Hepatology [facility]

## 2022-08-05 ENCOUNTER — Ambulatory Visit (HOSPITAL_COMMUNITY)
Admission: RE | Admit: 2022-08-05 | Discharge: 2022-08-05 | Disposition: A | Discharge status: Home / Self Care | Payer: Medicare Other | Source: Ambulatory Visit | Attending: Gastroenterology | Admitting: Gastroenterology

## 2022-08-05 DIAGNOSIS — K220 Achalasia of cardia: Secondary | ICD-10-CM | POA: Insufficient documentation

## 2022-08-05 DIAGNOSIS — R1310 Dysphagia, unspecified: Secondary | ICD-10-CM | POA: Diagnosis present

## 2022-08-21 ENCOUNTER — Encounter: Payer: Self-pay | Admitting: Urology

## 2022-08-21 ENCOUNTER — Ambulatory Visit (INDEPENDENT_AMBULATORY_CARE_PROVIDER_SITE_OTHER): Payer: Medicare Other | Admitting: Urology

## 2022-08-21 VITALS — BP 153/86 | HR 60 | Ht 59.0 in | Wt 137.5 lb

## 2022-08-21 DIAGNOSIS — R339 Retention of urine, unspecified: Secondary | ICD-10-CM | POA: Diagnosis not present

## 2022-08-21 DIAGNOSIS — R3911 Hesitancy of micturition: Secondary | ICD-10-CM | POA: Diagnosis not present

## 2022-08-21 DIAGNOSIS — R39198 Other difficulties with micturition: Secondary | ICD-10-CM | POA: Diagnosis not present

## 2022-08-21 DIAGNOSIS — M6289 Other specified disorders of muscle: Secondary | ICD-10-CM

## 2022-08-21 LAB — BLADDER SCAN AMB NON-IMAGING: Scan Result: 103

## 2022-08-21 NOTE — Patient Instructions (Signed)
Please let Threasa Alpha FNP know that I think it would be worthwhile to try to find an alternate medication for the duloxetine since it has been associated with difficulty urinating.

## 2022-08-21 NOTE — Progress Notes (Signed)
Subjective:  1. Incomplete bladder emptying   2. Pelvic floor dysfunction      08/21/22: [name] was being seen by Dr. Felipa Eth for incomplete bladder emptying and has been on silodosin and PT for pelvic floor dyssynergia in the past.  She had urodynamics in 7/23 with the results noted below.  Her UA is clear today and her PVR is 15m.  She has variable symptoms and can have small voids.  She is on duloxetine.   01/15/22: Tammy VERGAis a 82y.o. year old female who is seen for further evaluation of urinary symptoms.  She is followed by Dr.Bhutani for chronic kidney disease.  Her renal function is stable with a creatinine of 1.04 on 11/14/21.  She reported difficulty initiating bladder emptying at her recent visit.  [facility] has [facility] with [facility] last visit in December 2022.  She has been managed for her bladder symptoms with Rapaflo and physical therapy for pelvic floor dyssynergia.  She was restarted on her Rapaflo at her last visit in 12/22.   She reported continued symptoms of urinary hesitancy, straining, and sensation of incomplete emptying.  She voids 2- 4 times/day.  She continued on Rapaflo. Pelvic exam demonstrated a grade 2 cystocele.  I&O cath  showed a volume of 200 mL. Urodynamics from 01/03/2022 demonstrated a max capacity of 633 mL with normal sensation.  No evidence of stress incontinence or instability.  She was able to generate a voluntary contraction and void 316 mL with a max flow rate of 16 mL/s.  EMG leads required during voiding.  PVR was 316 mL.  No significant descent of the bladder was     ROS:  ROS:  A complete review of systems was performed.  All systems are negative except for pertinent findings as noted.   ROS  Allergies  Allergen Reactions   Botox [Onabotulinumtoxina]     Patient states that it became loose in her stomach , and it caused multiple problems.   Morphine Anaphylaxis and Other (See Comments)    "it will kill  me" "made me stop breathing"   Aspirin Other (See Comments)    Gi bleed    Cefuroxime Axetil Nausea And Vomiting   Codeine Nausea And Vomiting   Diltiazem Nausea And Vomiting   Dronabinol Nausea And Vomiting and Palpitations   Shellfish Allergy Other (See Comments)    Blood sugar drops   Other     Mold and trees   Ondansetron Hives   Penicillins Rash    Did it involve swelling of the face/tongue/throat, SOB, or low BP? No Did it involve sudden or severe rash/hives, skin peeling, or any reaction on the inside of your mouth or nose? Yes Did you need to seek medical attention at a hospital or doctor's office? Yes When did it last happen?  Over 10 years  If all above answers are "NO", may proceed with cephalosporin use.     Outpatient Encounter Medications as of 08/21/2022  Medication Sig   acetaminophen (TYLENOL) 500 MG tablet Take 500 mg by mouth every 6 (six) hours as needed for mild pain.    ALPRAZolam (XANAX XR) 1 MG 24 hr tablet Take 0.5-1 mg by mouth as needed for sleep (Anxiety).    Dapagliflozin Propanediol (FARXIGA PO) daily at 6 (six) AM.   dexlansoprazole (DEXILANT) 60 MG capsule Take 1 capsule (60 mg total) by mouth daily before breakfast.   dicyclomine (BENTYL) 10 MG capsule Take  1 capsule (10 mg total) by mouth every 12 (twelve) hours as needed for spasms (abdominal pain/discomfort).   DULoxetine (CYMBALTA) 60 MG capsule Take 60 mg by mouth every morning.    Erenumab-aooe (AIMOVIG [place]) Inject into the skin every 30 (thirty) days.   Evolocumab (REPATHA [place]) Inject into the skin. Every two weeks.   famotidine (PEPCID) 40 MG tablet Take one po 30-45 minutes prior to supper.   hydrocortisone (ANUSOL-HC) 2.5 % rectal cream APPLY 1 APPLICATION RECTALLY TWICE DAILY.   Insulin Glargine-Lixisenatide (SOLIQUA [place]) Inject 15 Units into the skin in the morning.   linaclotide (LINZESS) 72 MCG capsule Take 1 capsule (72 mcg total) by mouth daily before breakfast.   lisinopril (ZESTRIL)  2.5 MG tablet Take 2.5 mg by mouth daily.   loperamide (IMODIUM) 2 MG capsule Take 1 capsule (2 mg total) by mouth 2 (two) times daily as needed for diarrhea or loose stools.   Multiple Vitamin (MULTIVITAMIN WITH MINERALS) TABS tablet Take 1 tablet by mouth daily.   oxyCODONE-acetaminophen (PERCOCET) 7.5-325 MG tablet Take 1 tablet by mouth every 4 (four) hours as needed for severe pain.   polyvinyl alcohol (LIQUIFILM TEARS) 1.4 % ophthalmic solution Place 1 drop into both eyes 3 (three) times daily as needed for dry eyes.   silodosin (RAPAFLO) 8 MG CAPS capsule Take 8 mg by mouth at bedtime.    topiramate (TOPAMAX) 100 MG tablet Take 1 tablet (100 mg total) by mouth 2 (two) times daily.   Travoprost, BAK Free, (TRAVATAN) 0.004 % SOLN ophthalmic solution Place 1 drop into both eyes at bedtime.    No facility-administered encounter medications on file as of 08/21/2022.    Past Medical History:  Diagnosis Date   Anemia    Anxiety    Arthritis    "all over" (04/19/2013)   Asthma    Bleeding stomach ulcer 02/08/1979   Bloating    Chronic back pain    Chronic headaches    Chronic heartburn    Chronic neck pain    Chronic pain    Colon cancer (HCC)    DDD (degenerative disc disease), lumbar    Diabetic peripheral neuropathy ([place])    "in my feet" (04/19/2013)   Dizziness    Dysphagia    Family history of anesthesia complication    "daughter has PONV too" (04/19/2013)   Fibromyalgia    Gastroesophageal reflux    Gastroparesis    Glaucoma, bilateral    Hiatal hernia    "had it before; had OR; got it again" (04/19/2013)   History of blood transfusion 02/08/1979   "w/bleeding stomach ulcer" (04/19/2013)   History of kidney stones    Migraine    "take RX for it qd" (04/19/2013)   Nausea    Pneumonia 04/09/2012   PONV (postoperative nausea and vomiting)    Renal insufficiency    TIA (transient ischemic attack)    "3-4 before starting RX; none since" (04/19/2013)   Type II  diabetes mellitus ([place])    Walking pneumonia ~ 1966    Past Surgical History:  Procedure Laterality Date   ABDOMINAL ADHESION SURGERY  ~ 2012   "repaired wrap where they did hiatal hernia OR too" 911/04/2013)   ANTERIOR CERVICAL DISCECTOMY  ~ 2009   "only cleaned out arthritis and spurs" (04/19/2013)   BIOPSY  01/09/2021   Procedure: BIOPSY;  Surgeon: Rogene Houston, MD;  [facility];  Service: Endoscopy;;  antrum; distal esophagus;   CATARACT EXTRACTION W/  INTRAOCULAR LENS IMPLANT Left 04/13/2013   CHOLECYSTECTOMY  1980's   COLON SURGERY     COLONOSCOPY  10/11/2010   COLONOSCOPY  11/23/2009   COLONOSCOPY  07/28/2008   W/SNARE   COLONOSCOPY  06/28/07   COLONOSCOPY  05/10/07   W/POLYP   COLONOSCOPY  12/28/00   COLONOSCOPY N/A 06/29/2015   Procedure: COLONOSCOPY;  Surgeon: Rogene Houston, MD;  [facility];  Service: Endoscopy;  Laterality: N/A;  135   COLONOSCOPY WITH ESOPHAGOGASTRODUODENOSCOPY (EGD) N/A 05/13/2013   Procedure: COLONOSCOPY WITH ESOPHAGOGASTRODUODENOSCOPY (EGD);  Surgeon: Rogene Houston, MD;  [facility];  Service: Endoscopy;  Laterality: N/A;  855   COLONOSCOPY WITH PROPOFOL N/A 01/09/2021   Procedure: COLONOSCOPY WITH PROPOFOL;  Surgeon: Rogene Houston, MD;  [facility];  Service: Endoscopy;  Laterality: N/A;  12:50   ESOPHAGOGASTRODUODENOSCOPY (EGD) WITH PROPOFOL N/A 10/06/2018   Procedure: ESOPHAGOGASTRODUODENOSCOPY (EGD) WITH PROPOFOL;  Surgeon: Rogene Houston, MD;  [facility];  Service: Endoscopy;  Laterality: N/A;   ESOPHAGOGASTRODUODENOSCOPY (EGD) WITH PROPOFOL N/A 01/09/2021   Procedure: ESOPHAGOGASTRODUODENOSCOPY (EGD) WITH PROPOFOL;  Surgeon: Rogene Houston, MD;  [facility];  Service: Endoscopy;  Laterality: N/A;   HEMICOLECTOMY  2010   ZIEGLER   HIATAL HERNIA REPAIR  1970's   LEFT HEART CATHETERIZATION WITH CORONARY ANGIOGRAM N/A 04/19/2013   Procedure: LEFT HEART CATHETERIZATION  WITH CORONARY ANGIOGRAM;  Surgeon: Peter M Martinique, MD;  Location: [facility];  Service: Cardiovascular;  Laterality: N/A;   POLYPECTOMY  10/06/2018   Procedure: POLYPECTOMY;  Surgeon: Rogene Houston, MD;  [facility];  Service: Endoscopy;;  gastric   POLYPECTOMY  01/09/2021   Procedure: POLYPECTOMY;  Surgeon: Rogene Houston, MD;  [facility];  Service: Endoscopy;;  transverse;splenic flexure   [place]   "had a broken neck" (04/19/2013)   TONSILLECTOMY  ~ [place] Bilateral 12/04/2020   Procedure: BILATERAL TEMPOROMANDIBULAR JOINT ARTHROCENTESIS; DENTAL EXTRACTION TEETH #3,6,7,8,9,10,11,12,19,23,24,25,26,32 WITH ALVEOLOPLASTY;  Surgeon: Diona Browner, DMD;  Location: [place];  Service: Oral Surgery;  Laterality: Bilateral;   UPPER GASTROINTESTINAL ENDOSCOPY  10/11/2010   EGD ED   UPPER GASTROINTESTINAL ENDOSCOPY  11/23/2009   UPPER GASTROINTESTINAL ENDOSCOPY  05/10/07   EGD ED   UPPER GASTROINTESTINAL ENDOSCOPY  08/11/01   EGD ED   VAGINAL HYSTERECTOMY      Social History   Socioeconomic History   Marital status: Widowed    Spouse name: Not on file   Number of children: Not on file   Years of education: Not on file   Highest education level: Not on file  Occupational History    Employer: RETIRED  Tobacco Use   Smoking status: Former    Packs/day: 1.50    Years: 15.00    Additional pack years: 0.00    Total pack years: 22.50    Types: Cigarettes    Quit date: 03/17/1991    Years since quitting: 31.4    Passive exposure: Past   Smokeless tobacco: Never   Tobacco comments:    Patient states that it has ben greater than 20 years since she quit smoking  Vaping Use   Vaping Use: Never used  Substance and Sexual Activity   Alcohol use: No    Alcohol/week: 0.0 standard drinks of alcohol   Drug use: No   Sexual activity: Not Currently  Other Topics Concern   Not on file  Social History Narrative   Patient  lives at home alone.    Patient is retired.    Patient is widowed.    Patient has 4 children.   Patient has a 11th grade education.          Social Determinants of Health   Financial Resource Strain: Not on file  Food Insecurity: Not on file  Transportation Needs: Not on file  Physical Activity: Not on file  Stress: Not on file  Social Connections: Not on file  Intimate Partner Violence: Not on file    Family History  Problem Relation Age of Onset   Heart disease Mother    Diabetes Mother    Dementia Father    Healthy Sister    Diabetes Brother    Kidney cancer Brother    Diabetes Brother    Neuropathy Brother    Diabetes Daughter    Diabetes Daughter    Diabetes Son        Objective: Vitals:   08/21/22 1537  BP: (!) 153/86  Pulse: 60     Physical Exam  Lab Results:  PSA No results found for: "PSA" No results found for: "TESTOSTERONE"  UA is clear.   Studies/Results: No results found.  PVR is 179m.    Assessment & Plan: Dysfunctional voiding with hesitancy.  She is emptying well on silodosin but has some variable hesitancy.   She is on duloxetine which can be associated with voiding difficulty.  I will have her speak to her PCP about finding and alternative and will have her return in 3 months.    No orders of the defined types were placed in this encounter.    Orders Placed This Encounter  Procedures   Urinalysis, Routine w reflex microscopic   BLADDER SCAN AMB NON-IMAGING      Return in about 3 months (around 11/21/2022) for with PVR.   CC: LRemi Haggard FNP      JIrine Seal3/14/2024

## 2022-08-21 NOTE — Progress Notes (Signed)
post void residual =119m

## 2022-08-22 LAB — URINALYSIS, ROUTINE W REFLEX MICROSCOPIC
Bilirubin, UA: NEGATIVE
Ketones, UA: NEGATIVE
Leukocytes,UA: NEGATIVE
Nitrite, UA: NEGATIVE
Protein,UA: NEGATIVE
RBC, UA: NEGATIVE
Specific Gravity, UA: 1.01 (ref 1.005–1.030)
Urobilinogen, Ur: 0.2 mg/dL (ref 0.2–1.0)
pH, UA: 6 (ref 5.0–7.5)

## 2022-10-15 ENCOUNTER — Telehealth (INDEPENDENT_AMBULATORY_CARE_PROVIDER_SITE_OTHER): Payer: Self-pay | Admitting: *Deleted

## 2022-10-15 NOTE — Telephone Encounter (Signed)
Thanks for the update

## 2022-10-15 NOTE — Telephone Encounter (Signed)
Called to [facility] to check status of referral for EM & pH sent 08/11/22 - spoke to [place] and she stated they had made several attempts and left several messages to contact patient to schedule and patient hasn't returned any calls

## 2022-10-23 ENCOUNTER — Other Ambulatory Visit (INDEPENDENT_AMBULATORY_CARE_PROVIDER_SITE_OTHER): Payer: Self-pay | Admitting: Gastroenterology

## 2022-10-23 DIAGNOSIS — K2210 Ulcer of esophagus without bleeding: Secondary | ICD-10-CM

## 2022-11-06 ENCOUNTER — Ambulatory Visit: Payer: Medicare Other | Admitting: Anesthesiology

## 2022-11-06 ENCOUNTER — Encounter: Payer: Self-pay | Admitting: Anesthesiology

## 2022-11-06 NOTE — Progress Notes (Unsigned)
Safety precautions to be maintained throughout the outpatient stay will include: orient to surroundings, keep bed in low position, maintain call bell within reach at all times, provide assistance with transfer out of bed and ambulation.  

## 2022-11-20 ENCOUNTER — Ambulatory Visit (INDEPENDENT_AMBULATORY_CARE_PROVIDER_SITE_OTHER): Payer: Medicare Other | Admitting: Urology

## 2022-11-20 ENCOUNTER — Encounter: Payer: Self-pay | Admitting: Urology

## 2022-11-20 VITALS — BP 133/73 | HR 61 | Ht 59.0 in | Wt 125.0 lb

## 2022-11-20 DIAGNOSIS — M6289 Other specified disorders of muscle: Secondary | ICD-10-CM

## 2022-11-20 DIAGNOSIS — R339 Retention of urine, unspecified: Secondary | ICD-10-CM

## 2022-11-20 DIAGNOSIS — R3911 Hesitancy of micturition: Secondary | ICD-10-CM | POA: Diagnosis not present

## 2022-11-20 DIAGNOSIS — R39198 Other difficulties with micturition: Secondary | ICD-10-CM

## 2022-11-20 LAB — BLADDER SCAN AMB NON-IMAGING: Scan Result: 260

## 2022-11-20 NOTE — Progress Notes (Signed)
Subjective:  1. Incomplete bladder emptying   2. Pelvic floor dysfunction      11/20/22: [name] returns today in f/u for her history of incomplete emptying with dysfunctional voiding. She has hesitancy with the urge.   She reports progressive voiding difficulty and her PVR is .  She has no hematuria or dysuria.  She remains on Silodosin but is also still on Duloxetine.     08/21/22: [name] was being seen by Dr. Pete Glatter for incomplete bladder emptying and has been on silodosin and PT for pelvic floor dyssynergia in the past.  She had urodynamics in 7/23 with the results noted below.  Her UA is clear today and her PVR is .  She has variable symptoms and can have small voids.  She is on duloxetine.   01/15/22: [name] is a 82 y.o. year old female who is seen for further evaluation of urinary symptoms.  She is followed by Dr.Bhutani for chronic kidney disease.  Her renal function is stable with a creatinine of 1.04 on 11/14/21.  She reported difficulty initiating bladder emptying at her recent visit.  She has been followed by Dr. Sherron Monday at [facility] with her last visit in December 2022.  She has been managed for her bladder symptoms with Rapaflo and physical therapy for pelvic floor dyssynergia.  She was restarted on her Rapaflo at her last visit in 12/22.   She reported continued symptoms of urinary hesitancy, straining, and sensation of incomplete emptying.  She voids 2- 4 times/day.  She continued on Rapaflo. Pelvic exam demonstrated a grade 2 cystocele.  I&O cath  showed a volume of 200 mL. Urodynamics from 01/03/2022 demonstrated a max capacity of 633 mL with normal sensation.  No evidence of stress incontinence or instability.  She was able to generate a voluntary contraction and void 316 mL with a max flow rate of 16 mL/s.  EMG leads required during voiding.  PVR was 316 mL.  No significant descent of the bladder was     ROS:  ROS:  A complete review of systems was  performed.  All systems are negative except for pertinent findings as noted.   Review of Systems  Constitutional:  Positive for malaise/fatigue.  HENT:  Positive for congestion.   Eyes:  Positive for blurred vision.  Respiratory:  Positive for cough.   Gastrointestinal:  Positive for constipation, diarrhea, heartburn, nausea and vomiting.  Musculoskeletal:  Positive for back pain and joint pain.  Neurological:  Positive for dizziness and headaches.  Endo/Heme/Allergies:  Positive for polydipsia.  Psychiatric/Behavioral:  Positive for depression. The patient is nervous/anxious.     Allergies  Allergen Reactions   Botox [Onabotulinumtoxina]     Patient states that it became loose in her stomach , and it caused multiple problems.   Morphine Anaphylaxis and Other (See Comments)    "it will kill me" "made me stop breathing"   Aspirin Other (See Comments)    Gi bleed    Cefuroxime Axetil Nausea And Vomiting   Codeine Nausea And Vomiting   Diltiazem Nausea And Vomiting   Dronabinol Nausea And Vomiting and Palpitations   Shellfish Allergy Other (See Comments)    Blood sugar drops   Other     Mold and trees   Ondansetron Hives   Penicillins Rash    Did it involve swelling of the face/tongue/throat, SOB, or low BP? No Did it involve sudden or severe rash/hives, skin peeling, or any reaction on the inside of your  mouth or nose? Yes Did you need to seek medical attention at a hospital or doctor's office? Yes When did it last happen?  Over 10 years  If all above answers are "NO", may proceed with cephalosporin use.     Outpatient Encounter Medications as of 11/20/2022  Medication Sig   acetaminophen (TYLENOL) 500 MG tablet Take 500 mg by mouth every 6 (six) hours as needed for mild pain.    ALPRAZolam (XANAX XR) 1 MG 24 hr tablet Take 0.5-1 mg by mouth as needed for sleep (Anxiety).    ascorbic acid (VITAMIN C) 500 MG tablet Take 1,000 mg by mouth daily.   cetirizine (ZYRTEC) 10 MG  tablet Take 10 mg by mouth daily.   cholecalciferol (VITAMIN D3) 10 MCG (400 UNIT) TABS tablet Take 5,000 Units by mouth daily.   cyanocobalamin (VITAMIN B12) 1000 MCG tablet Take 1,000 mcg by mouth daily.   Dapagliflozin Propanediol (FARXIGA PO) Take 10 mg by mouth daily at 6 (six) AM.   dexlansoprazole (DEXILANT) 60 MG capsule TAKE 1 CAPSULE BY MOUTH DAILY BEFORE BREAKFAST.   dicyclomine (BENTYL) 10 MG capsule Take 1 capsule (10 mg total) by mouth every 12 (twelve) hours as needed for spasms (abdominal pain/discomfort).   Docusate Sodium (DSS) 100 MG CAPS Take 100 mg by mouth daily as needed.   DULoxetine (CYMBALTA) 60 MG capsule Take 60 mg by mouth every morning.    Erenumab-aooe (AIMOVIG [place]) Inject into the skin every 30 (thirty) days.   Evolocumab (REPATHA [place]) Inject into the skin. Every two weeks.   famotidine (PEPCID) 40 MG tablet Take one po 30-45 minutes prior to supper.   fluticasone (FLONASE) 50 MCG/ACT nasal spray Place 2 sprays into both nostrils daily.   gabapentin (NEURONTIN) 100 MG capsule Take 2 capsules by mouth 2 (two) times daily.   hydrocortisone (ANUSOL-HC) 2.5 % rectal cream APPLY 1 APPLICATION RECTALLY TWICE DAILY.   Insulin Glargine-Lixisenatide (SOLIQUA [place]) Inject 15 Units into the skin in the morning.   linaclotide (LINZESS) 72 MCG capsule Take 1 capsule (72 mcg total) by mouth daily before breakfast.   lisinopril (ZESTRIL) 2.5 MG tablet Take 2.5 mg by mouth daily.   loperamide (IMODIUM) 2 MG capsule Take 1 capsule (2 mg total) by mouth 2 (two) times daily as needed for diarrhea or loose stools.   Milnacipran HCl (SAVELLA) 100 MG TABS tablet Take 100 mg by mouth 2 (two) times daily.   Multiple Vitamin (MULTIVITAMIN WITH MINERALS) TABS tablet Take 1 tablet by mouth daily.   oxyCODONE-acetaminophen (PERCOCET) 7.5-325 MG tablet Take 1 tablet by mouth every 4 (four) hours as needed for severe pain.   polyethylene glycol powder (GLYCOLAX/MIRALAX) 17 GM/SCOOP powder Take 0.5  Containers by mouth daily as needed for mild constipation.   polyvinyl alcohol (LIQUIFILM TEARS) 1.4 % ophthalmic solution Place 1 drop into both eyes 3 (three) times daily as needed for dry eyes.   promethazine (PHENERGAN) 12.5 MG tablet Take 12.5 mg by mouth every 8 (eight) hours as needed.   rizatriptan (MAXALT-MLT) 10 MG disintegrating tablet Take 10 mg by mouth as needed for migraine. May repeat in 2 hours if needed   silodosin (RAPAFLO) 8 MG CAPS capsule Take 8 mg by mouth at bedtime.    tiZANidine (ZANAFLEX) 2 MG tablet Take 2 mg by mouth 3 (three) times daily as needed.   topiramate (TOPAMAX) 100 MG tablet Take 1 tablet (100 mg total) by mouth 2 (two) times daily.   Travoprost, BAK Free, (TRAVATAN)  0.004 % SOLN ophthalmic solution Place 1 drop into both eyes at bedtime.    No facility-administered encounter medications on file as of 11/20/2022.    Past Medical History:  Diagnosis Date   Anemia    Anxiety    Arthritis    "all over" (04/19/2013)   Asthma    Bleeding stomach ulcer 02/08/1979   Bloating    Chronic back pain    Chronic headaches    Chronic heartburn    Chronic neck pain    Chronic pain    Colon cancer (HCC)    DDD (degenerative disc disease), lumbar    Diabetic peripheral neuropathy (HCC)    "in my feet" (04/19/2013)   Dizziness    Dysphagia    Family history of anesthesia complication    "daughter has PONV too" (04/19/2013)   Fibromyalgia    Gastroesophageal reflux    Gastroparesis    Glaucoma, bilateral    Hiatal hernia    "had it before; had OR; got it again" (04/19/2013)   History of blood transfusion 02/08/1979   "w/bleeding stomach ulcer" (04/19/2013)   History of kidney stones    Migraine    "take RX for it qd" (04/19/2013)   Nausea    Pneumonia 04/09/2012   PONV (postoperative nausea and vomiting)    Renal insufficiency    TIA (transient ischemic attack)    "3-4 before starting RX; none since" (04/19/2013)   Type II diabetes mellitus (HCC)     Walking pneumonia ~ 1966    Past Surgical History:  Procedure Laterality Date   ABDOMINAL ADHESION SURGERY  ~ 2012   "repaired wrap where they did hiatal hernia OR too" 911/04/2013)   ANTERIOR CERVICAL DISCECTOMY  ~ 2009   "only cleaned out arthritis and spurs" (04/19/2013)   BIOPSY  01/09/2021   Procedure: BIOPSY;  Surgeon: Malissa Hippo, MD;  Location: [facility];  Service: Endoscopy;;  antrum; distal esophagus;   CATARACT EXTRACTION W/ INTRAOCULAR LENS IMPLANT Left 04/13/2013   CHOLECYSTECTOMY  1980's   COLON SURGERY     COLONOSCOPY  10/11/2010   COLONOSCOPY  11/23/2009   COLONOSCOPY  07/28/2008   W/SNARE   COLONOSCOPY  06/28/07   COLONOSCOPY  05/10/07   W/POLYP   COLONOSCOPY  12/28/00   COLONOSCOPY N/A 06/29/2015   Procedure: COLONOSCOPY;  Surgeon: Malissa Hippo, MD;  Location: [facility];  Service: Endoscopy;  Laterality: N/A;  135   COLONOSCOPY WITH ESOPHAGOGASTRODUODENOSCOPY (EGD) N/A 05/13/2013   Procedure: COLONOSCOPY WITH ESOPHAGOGASTRODUODENOSCOPY (EGD);  Surgeon: Malissa Hippo, MD;  Location: [facility];  Service: Endoscopy;  Laterality: N/A;  855   COLONOSCOPY WITH PROPOFOL N/A 01/09/2021   Procedure: COLONOSCOPY WITH PROPOFOL;  Surgeon: Malissa Hippo, MD;  Location: [facility];  Service: Endoscopy;  Laterality: N/A;  12:50   ESOPHAGOGASTRODUODENOSCOPY (EGD) WITH PROPOFOL N/A 10/06/2018   Procedure: ESOPHAGOGASTRODUODENOSCOPY (EGD) WITH PROPOFOL;  Surgeon: Malissa Hippo, MD;  Location: [facility];  Service: Endoscopy;  Laterality: N/A;   ESOPHAGOGASTRODUODENOSCOPY (EGD) WITH PROPOFOL N/A 01/09/2021   Procedure: ESOPHAGOGASTRODUODENOSCOPY (EGD) WITH PROPOFOL;  Surgeon: Malissa Hippo, MD;  Location: [facility];  Service: Endoscopy;  Laterality: N/A;   HEMICOLECTOMY  2010   ZIEGLER   HIATAL HERNIA REPAIR  1970's   LEFT HEART CATHETERIZATION WITH CORONARY ANGIOGRAM N/A 04/19/2013   Procedure: LEFT HEART CATHETERIZATION WITH CORONARY ANGIOGRAM;   Surgeon: Peter M Swaziland, MD;  Location: [facility];  Service: Cardiovascular;  Laterality:  N/A;   POLYPECTOMY  10/06/2018   Procedure: POLYPECTOMY;  Surgeon: Malissa Hippo, MD;  Location: [facility];  Service: Endoscopy;;  gastric   POLYPECTOMY  01/09/2021   Procedure: POLYPECTOMY;  Surgeon: Malissa Hippo, MD;  Location: [facility];  Service: Endoscopy;;  transverse;splenic flexure   POSTERIOR FUSION CERVICAL SPINE  1985   "had a broken neck" (04/19/2013)   TONSILLECTOMY  ~ 1953   TOOTH EXTRACTION Bilateral 12/04/2020   Procedure: BILATERAL TEMPOROMANDIBULAR JOINT ARTHROCENTESIS; DENTAL EXTRACTION TEETH #3,6,7,8,9,10,11,12,19,23,24,25,26,32 WITH ALVEOLOPLASTY;  Surgeon: Ocie Doyne, DMD;  Location: [facility];  Service: Oral Surgery;  Laterality: Bilateral;   UPPER GASTROINTESTINAL ENDOSCOPY  10/11/2010   EGD ED   UPPER GASTROINTESTINAL ENDOSCOPY  11/23/2009   UPPER GASTROINTESTINAL ENDOSCOPY  05/10/07   EGD ED   UPPER GASTROINTESTINAL ENDOSCOPY  08/11/01   EGD ED   VAGINAL HYSTERECTOMY      Social History   Socioeconomic History   Marital status: Widowed    Spouse name: Not on file   Number of children: Not on file   Years of education: Not on file   Highest education level: Not on file  Occupational History    Employer: RETIRED  Tobacco Use   Smoking status: Former    Packs/day: 1.50    Years: 15.00    Additional pack years: 0.00    Total pack years: 22.50    Types: Cigarettes    Quit date: 03/17/1991    Years since quitting: 31.7    Passive exposure: Past   Smokeless tobacco: Never   Tobacco comments:    Patient states that it has ben greater than 20 years since she quit smoking  Vaping Use   Vaping Use: Never used  Substance and Sexual Activity   Alcohol use: No    Alcohol/week: 0.0 standard drinks of alcohol   Drug use: No   Sexual activity: Not Currently  Other Topics Concern   Not on file  Social History Narrative   Patient lives at home alone.     Patient is retired.    Patient is widowed.    Patient has 4 children.   Patient has a 11th grade education.          Social Determinants of Health   Financial Resource Strain: Not on file  Food Insecurity: Not on file  Transportation Needs: Not on file  Physical Activity: Not on file  Stress: Not on file  Social Connections: Not on file  Intimate Partner Violence: Not on file    Family History  Problem Relation Age of Onset   Heart disease Mother    Diabetes Mother    Dementia Father    Healthy Sister    Diabetes Brother    Kidney cancer Brother    Diabetes Brother    Neuropathy Brother    Diabetes Daughter    Diabetes Daughter    Diabetes Son        Objective: Vitals:   11/20/22 0934  BP: 133/73  Pulse: 61     Physical Exam Vitals reviewed.  Constitutional:      Appearance: Normal appearance.  Neurological:     Mental Status: She is alert.     Lab Results:  PSA No results found for: "PSA" No results found for: "TESTOSTERONE"   Studies/Results: No results found.  PVR is .    Assessment & Plan: Dysfunctional voiding with hesitancy.  She is reporting some progressive voiding difficulty with a PVR of .  She is on duloxetine which can be associated with voiding difficulty.  I will have her speak to her PCP about finding and alternative and will have her return in 3 months.   She is not interested in CIC so I will have her try PTNS.   No orders of the defined types were placed in this encounter.    Orders Placed This Encounter  Procedures   PTNS-Percutaneous Tibial Nerve Stimulati    Standing Status:   Future    Standing Expiration Date:   11/20/2023    Order Specific Question:   Procedure Location    Answer:   [facility]   Urinalysis, Routine w reflex microscopic   BLADDER SCAN AMB NON-IMAGING      Return in about 3 months (around 02/20/2023) for start weekly PTNS and f/u with PVR with Sarah with the 6th treatment.  .   CC: Armando Gang, FNP      Bjorn Pippin 11/21/2022

## 2022-11-20 NOTE — Progress Notes (Signed)
post void residual =268mL

## 2022-11-25 ENCOUNTER — Ambulatory Visit (INDEPENDENT_AMBULATORY_CARE_PROVIDER_SITE_OTHER): Payer: Medicare Other | Admitting: Gastroenterology

## 2022-11-25 ENCOUNTER — Encounter (INDEPENDENT_AMBULATORY_CARE_PROVIDER_SITE_OTHER): Payer: Self-pay | Admitting: Gastroenterology

## 2022-11-25 VITALS — BP 126/78 | HR 76 | Temp 99.0°F | Ht 59.0 in | Wt 132.8 lb

## 2022-11-25 DIAGNOSIS — K581 Irritable bowel syndrome with constipation: Secondary | ICD-10-CM | POA: Diagnosis not present

## 2022-11-25 DIAGNOSIS — K220 Achalasia of cardia: Secondary | ICD-10-CM

## 2022-11-25 DIAGNOSIS — R1310 Dysphagia, unspecified: Secondary | ICD-10-CM

## 2022-11-25 NOTE — Progress Notes (Unsigned)
Referring Provider: Armando Gang, FNP Primary Care Physician:  Armando Gang, FNP Primary GI Physician: Levon Hedger   Chief Complaint  Patient presents with   Dysphagia    Follow up on dysphagia. Has been getting choked and drinking a lot of shakes due to difficulty swallowing. Doing protein shakes with fresh fruit in the mornings.    HPI:   [name] is a 82 y.o. female with past medical history of anxiety, diabetes, diabetic gastroparesis, fibromyalgia, chronic idiopathic constipation, GERD, TIA, achalasia status post Heller myotomy, history of colorectal cancer status post resection in 2010   Patient presenting today for follow up  Last seen February 2024, at that time having more dysphagia with liquids and solids, having heartburn daily despite dexilant and pepcid. Heller myotomy in 2013, improvement in symptoms for years after this. Taking clearlax 1-2 caps per day, having a BM every 3 weeks, had linzess previously with good results but ran out. Taking percocet every 10 days for pain   Recomended to have timed barium esophagram with pill-achalasia protocol, ph impedence testing/manometry, stop pepcid continue dexilant, start linzess  Esophagram done 08/05/22 with dilation of esophagus, dysmotility, possibly secondary to achalasia. Advised to proceed with manometry   Per chart review, several attempts made to schedule manometry though unable to reach patient.  Present:  Patient states that her swallowing has become worse since she had the esophagram. She notes she is having to do mostly liquids. She feels that most solid foods get stuck. She feels that liquids tend to go down well. She is doing mostly liquid food right now, her daughter is making her a protein shake in the morning. She feels that symptoms have gradually gotten worse. She continues to have heartburn multiple days per week. She is taking mylanta very frequently as well. She takes famotidine as needed as  well as tums. She also notes acid regurgitation, sometimes waking her up at night. She has not been to [facility] for ph impedence or manometry, patient's daughter states that they did not get a call and would need to call her for contact to schedule. She will sometimes have to go cough the food back up or it will come back up if it gets stuck. She notes some nausea at times as well. She has phenergan that she takes on occasion, this helps but makes her drowsy, she cannot tolerate zofran.   She is taking linzess 1-2x/week, could not tolerate everyday as she had diarrhea with it. She is having a BM usually 0-2x/day, sometimes may skip one day. Stools are mostly loose, sometimes has a formed stool. No rectal bleeding. She is not taking percocet at this time.   Last EGD: 01/2021 - Normal proximal esophagus. - Esophageal plaques were found, suspicious for candidiasis. Cells for cytology obtained -consistent with Candida. - Abnormal (rule out Barrett's esophagus) mucosa in the esophagus.  Biopsies were consistent with reflux - Z-line irregular, 33 cm from the incisors. - Small hyperplastic appearing polyp in gastric fundus. Was left alone. - Gastritis. Biopsied, negative for H. pylori or intestinal metaplasia. - A fundoplication was found. The wrap appears loose. - Normal duodenal bulb and second portion of the duodenum.   Last Colonoscopy: 01/2021 - Perianal skin tags found on perianal exam. - The examined portion of the ileum was normal. - Diverticulosis in the transverse colon. - Two 4 to 6 mm polyps, removed with a cold snare. Resected and retrieved.  Pathology showed tubular adenomas x2 -  External hemorrhoids.   Recommendation to repeat colonoscopy in 5 years   Past Medical History:  Diagnosis Date   Anemia    Anxiety    Arthritis    "all over" (04/19/2013)   Asthma    Bleeding stomach ulcer 02/08/1979   Bloating    Chronic back pain    Chronic headaches    Chronic heartburn     Chronic neck pain    Chronic pain    Colon cancer (HCC)    DDD (degenerative disc disease), lumbar    Diabetic peripheral neuropathy (HCC)    "in my feet" (04/19/2013)   Dizziness    Dysphagia    Family history of anesthesia complication    "daughter has PONV too" (04/19/2013)   Fibromyalgia    Gastroesophageal reflux    Gastroparesis    Glaucoma, bilateral    Hiatal hernia    "had it before; had OR; got it again" (04/19/2013)   History of blood transfusion 02/08/1979   "w/bleeding stomach ulcer" (04/19/2013)   History of kidney stones    Migraine    "take RX for it qd" (04/19/2013)   Nausea    Pneumonia 04/09/2012   PONV (postoperative nausea and vomiting)    Renal insufficiency    TIA (transient ischemic attack)    "3-4 before starting RX; none since" (04/19/2013)   Type II diabetes mellitus (HCC)    Walking pneumonia ~ 1966    Past Surgical History:  Procedure Laterality Date   ABDOMINAL ADHESION SURGERY  ~ 2012   "repaired wrap where they did hiatal hernia OR too" 911/04/2013)   ANTERIOR CERVICAL DISCECTOMY  ~ 2009   "only cleaned out arthritis and spurs" (04/19/2013)   BIOPSY  01/09/2021   Procedure: BIOPSY;  Surgeon: Malissa Hippo, MD;  Location: [facility];  Service: Endoscopy;;  antrum; distal esophagus;   CATARACT EXTRACTION W/ INTRAOCULAR LENS IMPLANT Left 04/13/2013   CHOLECYSTECTOMY  1980's   COLON SURGERY     COLONOSCOPY  10/11/2010   COLONOSCOPY  11/23/2009   COLONOSCOPY  07/28/2008   W/SNARE   COLONOSCOPY  06/28/07   COLONOSCOPY  05/10/07   W/POLYP   COLONOSCOPY  12/28/00   COLONOSCOPY N/A 06/29/2015   Procedure: COLONOSCOPY;  Surgeon: Malissa Hippo, MD;  Location: [facility];  Service: Endoscopy;  Laterality: N/A;  135   COLONOSCOPY WITH ESOPHAGOGASTRODUODENOSCOPY (EGD) N/A 05/13/2013   Procedure: COLONOSCOPY WITH ESOPHAGOGASTRODUODENOSCOPY (EGD);  Surgeon: Malissa Hippo, MD;  Location: [facility];  Service: Endoscopy;  Laterality: N/A;   855   COLONOSCOPY WITH PROPOFOL N/A 01/09/2021   Procedure: COLONOSCOPY WITH PROPOFOL;  Surgeon: Malissa Hippo, MD;  Location: [facility];  Service: Endoscopy;  Laterality: N/A;  12:50   ESOPHAGOGASTRODUODENOSCOPY (EGD) WITH PROPOFOL N/A 10/06/2018   Procedure: ESOPHAGOGASTRODUODENOSCOPY (EGD) WITH PROPOFOL;  Surgeon: Malissa Hippo, MD;  Location: [facility];  Service: Endoscopy;  Laterality: N/A;   ESOPHAGOGASTRODUODENOSCOPY (EGD) WITH PROPOFOL N/A 01/09/2021   Procedure: ESOPHAGOGASTRODUODENOSCOPY (EGD) WITH PROPOFOL;  Surgeon: Malissa Hippo, MD;  Location: [facility];  Service: Endoscopy;  Laterality: N/A;   HEMICOLECTOMY  2010   ZIEGLER   HIATAL HERNIA REPAIR  1970's   LEFT HEART CATHETERIZATION WITH CORONARY ANGIOGRAM N/A 04/19/2013   Procedure: LEFT HEART CATHETERIZATION WITH CORONARY ANGIOGRAM;  Surgeon: Peter M Swaziland, MD;  Location: [facility] C[facility];  Service: Cardiovascular;  Laterality: N/A;   POLYPECTOMY  10/06/2018   Procedure: POLYPECTOMY;  Surgeon: Malissa Hippo,  MD;  Location: [facility];  Service: Endoscopy;;  gastric   POLYPECTOMY  01/09/2021   Procedure: POLYPECTOMY;  Surgeon: Malissa Hippo, MD;  Location: [facility];  Service: Endoscopy;;  transverse;splenic flexure   POSTERIOR FUSION CERVICAL SPINE  1985   "had a broken neck" (04/19/2013)   TONSILLECTOMY  ~ 1953   TOOTH EXTRACTION Bilateral 12/04/2020   Procedure: BILATERAL TEMPOROMANDIBULAR JOINT ARTHROCENTESIS; DENTAL EXTRACTION TEETH #3,6,7,8,9,10,11,12,19,23,24,25,26,32 WITH ALVEOLOPLASTY;  Surgeon: Ocie Doyne, DMD;  Location: [facility];  Service: Oral Surgery;  Laterality: Bilateral;   UPPER GASTROINTESTINAL ENDOSCOPY  10/11/2010   EGD ED   UPPER GASTROINTESTINAL ENDOSCOPY  11/23/2009   UPPER GASTROINTESTINAL ENDOSCOPY  05/10/07   EGD ED   UPPER GASTROINTESTINAL ENDOSCOPY  08/11/01   EGD ED   VAGINAL HYSTERECTOMY      Current Outpatient Medications  Medication Sig Dispense Refill    acetaminophen (TYLENOL) 500 MG tablet Take 500 mg by mouth every 6 (six) hours as needed for mild pain.      ALPRAZolam (XANAX XR) 1 MG 24 hr tablet Take 0.5-1 mg by mouth as needed for sleep (Anxiety).      ascorbic acid (VITAMIN C) 500 MG tablet Take 1,000 mg by mouth daily.     cetirizine (ZYRTEC) 10 MG tablet Take 10 mg by mouth daily.     cholecalciferol (VITAMIN D3) 10 MCG (400 UNIT) TABS tablet Take 5,000 Units by mouth daily.     cyanocobalamin (VITAMIN B12) 1000 MCG tablet Take 1,000 mcg by mouth daily.     Dapagliflozin Propanediol (FARXIGA PO) Take 10 mg by mouth daily at 6 (six) AM.     dexlansoprazole (DEXILANT) 60 MG capsule TAKE 1 CAPSULE BY MOUTH DAILY BEFORE BREAKFAST. 90 capsule 0   dicyclomine (BENTYL) 10 MG capsule Take 1 capsule (10 mg total) by mouth every 12 (twelve) hours as needed for spasms (abdominal pain/discomfort). 60 capsule 2   Docusate Sodium (DSS) 100 MG CAPS Take 100 mg by mouth daily as needed.     DULoxetine (CYMBALTA) 60 MG capsule Take 60 mg by mouth every morning.      Erenumab-aooe (AIMOVIG Switzer) Inject into the skin every 30 (thirty) days.     Evolocumab (REPATHA ) Inject into the skin. Every two weeks.     famotidine (PEPCID) 40 MG tablet Take one po 30-45 minutes prior to supper. 90 tablet 3   fluticasone (FLONASE) 50 MCG/ACT nasal spray Place 2 sprays into both nostrils daily.     gabapentin (NEURONTIN) 100 MG capsule Take 2 capsules by mouth 2 (two) times daily.     hydrocortisone (ANUSOL-HC) 2.5 % rectal cream APPLY 1 APPLICATION RECTALLY TWICE DAILY. 30 g 1   Insulin Glargine-Lixisenatide (SOLIQUA ) Inject 15 Units into the skin in the morning.     linaclotide (LINZESS) 72 MCG capsule Take 1 capsule (72 mcg total) by mouth daily before breakfast. 90 capsule 3   lisinopril (ZESTRIL) 2.5 MG tablet Take 2.5 mg by mouth daily.     loperamide (IMODIUM) 2 MG capsule Take 1 capsule (2 mg total) by mouth 2 (two) times daily as needed for diarrhea or loose  stools.     Milnacipran HCl (SAVELLA) 100 MG TABS tablet Take 100 mg by mouth 2 (two) times daily.     Multiple Vitamin (MULTIVITAMIN WITH MINERALS) TABS tablet Take 1 tablet by mouth daily.     oxyCODONE-acetaminophen (PERCOCET) 7.5-325 MG tablet Take 1 tablet by mouth every 4 (four) hours as needed  for severe pain.     polyethylene glycol powder (GLYCOLAX/MIRALAX) 17 GM/SCOOP powder Take 0.5 Containers by mouth daily as needed for mild constipation.     polyvinyl alcohol (LIQUIFILM TEARS) 1.4 % ophthalmic solution Place 1 drop into both eyes 3 (three) times daily as needed for dry eyes.     promethazine (PHENERGAN) 12.5 MG tablet Take 12.5 mg by mouth every 8 (eight) hours as needed.     rizatriptan (MAXALT-MLT) 10 MG disintegrating tablet Take 10 mg by mouth as needed for migraine. May repeat in 2 hours if needed     silodosin (RAPAFLO) 8 MG CAPS capsule Take 8 mg by mouth at bedtime.      tiZANidine (ZANAFLEX) 2 MG tablet Take 2 mg by mouth 3 (three) times daily as needed.     topiramate (TOPAMAX) 100 MG tablet Take 1 tablet (100 mg total) by mouth 2 (two) times daily. 60 tablet 5   Travoprost, BAK Free, (TRAVATAN) 0.004 % SOLN ophthalmic solution Place 1 drop into both eyes at bedtime.      No current facility-administered medications for this visit.    Allergies as of 11/25/2022 - Review Complete 11/25/2022  Allergen Reaction Noted   Botox [onabotulinumtoxina]  05/05/2017   Morphine Anaphylaxis and Other (See Comments)    Aspirin Other (See Comments) 05/02/2012   Cefuroxime axetil Nausea And Vomiting 03/25/2013   Codeine Nausea And Vomiting    Diltiazem Nausea And Vomiting 03/25/2013   Dronabinol Nausea And Vomiting and Palpitations 10/24/2013   Shellfish allergy Other (See Comments) 03/11/2011   Other  10/15/2021   Ondansetron Hives 10/28/2011   Penicillins Rash     Family History  Problem Relation Age of Onset   Heart disease Mother    Diabetes Mother    Dementia Father     Healthy Sister    Diabetes Brother    Kidney cancer Brother    Diabetes Brother    Neuropathy Brother    Diabetes Daughter    Diabetes Daughter    Diabetes Son     Social History   Socioeconomic History   Marital status: Widowed    Spouse name: Not on file   Number of children: Not on file   Years of education: Not on file   Highest education level: Not on file  Occupational History    Employer: RETIRED  Tobacco Use   Smoking status: Former    Packs/day: 1.50    Years: 15.00    Additional pack years: 0.00    Total pack years: 22.50    Types: Cigarettes    Quit date: 03/17/1991    Years since quitting: 31.7    Passive exposure: Past   Smokeless tobacco: Never   Tobacco comments:    Patient states that it has ben greater than 20 years since she quit smoking  Vaping Use   Vaping Use: Never used  Substance and Sexual Activity   Alcohol use: No    Alcohol/week: 0.0 standard drinks of alcohol   Drug use: No   Sexual activity: Not Currently  Other Topics Concern   Not on file  Social History Narrative   Patient lives at home alone.    Patient is retired.    Patient is widowed.    Patient has 4 children.   Patient has a 11th grade education.          Social Determinants of Health   Financial Resource Strain: Not on file  Food Insecurity: Not on file  Transportation Needs: Not on file  Physical Activity: Not on file  Stress: Not on file  Social Connections: Not on file    Review of systems General: negative for malaise, night sweats, fever, chills, weight los Neck: Negative for lumps, goiter, pain and significant neck swelling Resp: Negative for cough, wheezing, dyspnea at rest CV: Negative for chest pain, leg swelling, palpitations, orthopnea GI: denies melena, hematochezia, nausea, vomiting, diarrhea, constipation, dysphagia, odyonophagia, early satiety or unintentional weight loss.  MSK: Negative for joint pain or swelling, back pain, and muscle  pain. Derm: Negative for itching or rash Psych: Denies depression, anxiety, memory loss, confusion. No homicidal or suicidal ideation.  Heme: Negative for prolonged bleeding, bruising easily, and swollen nodes. Endocrine: Negative for cold or heat intolerance, polyuria, polydipsia and goiter. Neuro: negative for tremor, gait imbalance, syncope and seizures. The remainder of the review of systems is noncontributory.  Physical Exam: BP 126/78 (BP Location: Right Arm, Patient Position: Sitting, Cuff Size: Normal)   Pulse 76   Temp 99 F (37.2 C) (Oral)   Ht 4\' 11"  (1.499 m)   Wt 132 lb 12.8 oz (60.2 kg)   BMI 26.82 kg/m  General:   Alert and oriented. No distress noted. Pleasant and cooperative.  Head:  Normocephalic and atraumatic. Eyes:  Conjuctiva clear without scleral icterus. Mouth:  Oral mucosa pink and moist. Good dentition. No lesions. Heart: Normal rate and rhythm, s1 and s2 heart sounds present.  Lungs: Clear lung sounds in all lobes. Respirations equal and unlabored. Abdomen:  +BS, soft, non-tender and non-distended. No rebound or guarding. No HSM or masses noted. Derm: No palmar erythema or jaundice Msk:  Symmetrical without gross deformities. Normal posture. Extremities:  Without edema. Neurologic:  Alert and  oriented x4 Psych:  Alert and cooperative. Normal mood and affect.  Invalid input(s): "6 MONTHS"   ASSESSMENT: LEWIS GLERUM is a 82 y.o. female presenting today    PLAN:  1. 2. 3.   Follow Up: ***  Rico Massar L. Jeanmarie Hubert, MSN, APRN, AGNP-C Adult-Gerontology Nurse Practitioner [facility]

## 2022-11-25 NOTE — Patient Instructions (Signed)
Please continue with dexilant 60mg  daily  Continue with linzess 1-2 times per week as you are doing Please reach out to [facility] to schedule pH impdence and manometry testing Continue with liquids if solids are not well tolerated, would recommend 2-3 Protein shakes per day depending on how much solid food you are able to consume. This will help to maintain nutrition We will be in touch once testing has been completed and report has been sent back to [place]  Follow up 3 months

## 2022-11-26 ENCOUNTER — Encounter: Payer: Self-pay | Admitting: Anesthesiology

## 2022-11-26 ENCOUNTER — Ambulatory Visit: Payer: Medicare Other | Attending: Anesthesiology | Admitting: Anesthesiology

## 2022-11-26 VITALS — BP 149/112 | HR 104 | Temp 97.1°F | Resp 16 | Ht 59.0 in | Wt 134.0 lb

## 2022-11-26 DIAGNOSIS — G8929 Other chronic pain: Secondary | ICD-10-CM | POA: Insufficient documentation

## 2022-11-26 DIAGNOSIS — M545 Low back pain, unspecified: Secondary | ICD-10-CM | POA: Diagnosis present

## 2022-11-26 DIAGNOSIS — M797 Fibromyalgia: Secondary | ICD-10-CM

## 2022-11-26 DIAGNOSIS — M48062 Spinal stenosis, lumbar region with neurogenic claudication: Secondary | ICD-10-CM

## 2022-11-26 DIAGNOSIS — G44309 Post-traumatic headache, unspecified, not intractable: Secondary | ICD-10-CM | POA: Diagnosis present

## 2022-11-26 DIAGNOSIS — M48061 Spinal stenosis, lumbar region without neurogenic claudication: Secondary | ICD-10-CM | POA: Insufficient documentation

## 2022-11-26 DIAGNOSIS — Q762 Congenital spondylolisthesis: Secondary | ICD-10-CM

## 2022-11-26 DIAGNOSIS — M961 Postlaminectomy syndrome, not elsewhere classified: Secondary | ICD-10-CM

## 2022-11-26 DIAGNOSIS — G894 Chronic pain syndrome: Secondary | ICD-10-CM

## 2022-11-26 MED ORDER — TRAMADOL HCL 50 MG PO TABS: 50.0000 mg | ORAL_TABLET | Freq: Three times a day (TID) | ORAL | 1 refills | Status: DC

## 2022-11-26 NOTE — Progress Notes (Signed)
Subjective:  Patient ID: [name], female    DOB: 01/04/1941  Age: 82 y.o. MRN: [id number]  CC: Muscle Pain (fibromyalgia)     PROCEDURE: None  HPI [name] presents for new patient evaluation.  She has a longstanding history of fibromyalgia with diffuse body pain.  She describes specifically chronic low back pain chronic neck pain following surgery and pain that involves her shoulders neck hips buttocks and knees.  The pain is a chronic active gnawing aching pain worse with any activity and present throughout the day.  She cannot gain much relief.  She has tried heat and cold application with limited success.  Stretching activities do not limit the pain and physical therapy modalities have been unsuccessful.  She has been on previous opioid medications and is concerned about going back on these secondary to side effects from the medication including constipation.  She has a history of gastroparesis as is.  Additionally the pain causes her to be quite depressed and limits her activity.  History [name] has a past medical history of Anemia, Anxiety, Arthritis, Asthma, Bleeding stomach ulcer (02/08/1979), Bloating, Chronic back pain, Chronic headaches, Chronic heartburn, Chronic neck pain, Chronic pain, Colon cancer (HCC), DDD (degenerative disc disease), lumbar, Diabetic peripheral neuropathy (HCC), Dizziness, Dysphagia, Family history of anesthesia complication, Fibromyalgia, Gastroesophageal reflux, Gastroparesis, Glaucoma, bilateral, Hiatal hernia, History of blood transfusion (02/08/1979), History of kidney stones, Migraine, Nausea, Pneumonia (04/09/2012), PONV (postoperative nausea and vomiting), Renal insufficiency, TIA (transient ischemic attack), Type II diabetes mellitus (HCC), and Walking pneumonia (~ 1966).   She has a past surgical history that includes Hemicolectomy (2010); Colonoscopy (10/11/2010); Colonoscopy (11/23/2009); Colonoscopy (07/28/2008); Colonoscopy (06/28/07);  Colonoscopy (05/10/07); Colonoscopy (12/28/00); Upper gastrointestinal endoscopy (10/11/2010); Upper gastrointestinal endoscopy (11/23/2009); Upper gastrointestinal endoscopy (05/10/07); Upper gastrointestinal endoscopy (08/11/01); Posterior fusion cervical spine (1985); Hiatal hernia repair (1970's); Tonsillectomy (~ 1953); Colon surgery; Cholecystectomy (1980's); Anterior cervical discectomy (~ 2009); Vaginal hysterectomy; Cataract extraction w/ intraocular lens implant (Left, 04/13/2013); Abdominal adhesion surgery (~ 2012); Colonoscopy with esophagogastroduodenoscopy (egd) (N/A, 05/13/2013); left heart catheterization with coronary angiogram (N/A, 04/19/2013); Colonoscopy (N/A, 06/29/2015); Esophagogastroduodenoscopy (egd) with propofol (N/A, 10/06/2018); polypectomy (10/06/2018); Tooth Extraction (Bilateral, 12/04/2020); Colonoscopy with propofol (N/A, 01/09/2021); Esophagogastroduodenoscopy (egd) with propofol (N/A, 01/09/2021); biopsy (01/09/2021); and polypectomy (01/09/2021).   Her family history includes Dementia in her father; Diabetes in her brother, brother, daughter, daughter, mother, and son; Healthy in her sister; Heart disease in her mother; Kidney cancer in her brother; Neuropathy in her brother.She reports that she quit smoking about 31 years ago. Her smoking use included cigarettes. She has a 22.50 pack-year smoking history. She has been exposed to tobacco smoke. She has never used smokeless tobacco. She reports that she does not drink alcohol and does not use drugs.  No valid procedures specified.  No results found for: "TOXASSSELUR"  Outpatient Medications Prior to Visit  Medication Sig Dispense Refill   acetaminophen (TYLENOL) 500 MG tablet Take 500 mg by mouth every 6 (six) hours as needed for mild pain.      ALPRAZolam (XANAX XR) 1 MG 24 hr tablet Take 0.5-1 mg by mouth as needed for sleep (Anxiety).      ascorbic acid (VITAMIN C) 500 MG tablet Take 1,000 mg by mouth daily.     cetirizine  (ZYRTEC) 10 MG tablet Take 10 mg by mouth daily.     cholecalciferol (VITAMIN D3) 10 MCG (400 UNIT) TABS tablet Take 5,000 Units by mouth daily.     cyanocobalamin (VITAMIN B12)  1000 MCG tablet Take 1,000 mcg by mouth daily.     Dapagliflozin Propanediol (FARXIGA PO) Take 10 mg by mouth daily at 6 (six) AM.     dexlansoprazole (DEXILANT) 60 MG capsule TAKE 1 CAPSULE BY MOUTH DAILY BEFORE BREAKFAST. 90 capsule 0   dicyclomine (BENTYL) 10 MG capsule Take 1 capsule (10 mg total) by mouth every 12 (twelve) hours as needed for spasms (abdominal pain/discomfort). 60 capsule 2   Docusate Sodium (DSS) 100 MG CAPS Take 100 mg by mouth daily as needed.     DULoxetine (CYMBALTA) 60 MG capsule Take 60 mg by mouth every morning.      Erenumab-aooe (AIMOVIG [place]) Inject into the skin every 30 (thirty) days.     famotidine (PEPCID) 40 MG tablet Take one po 30-45 minutes prior to supper. 90 tablet 3   fluticasone (FLONASE) 50 MCG/ACT nasal spray Place 2 sprays into both nostrils daily.     gabapentin (NEURONTIN) 100 MG capsule Take 2 capsules by mouth 2 (two) times daily.     hydrocortisone (ANUSOL-HC) 2.5 % rectal cream APPLY 1 APPLICATION RECTALLY TWICE DAILY. 30 g 1   Insulin Glargine-Lixisenatide (SOLIQUA [place]) Inject 15 Units into the skin in the morning.     linaclotide (LINZESS) 72 MCG capsule Take 1 capsule (72 mcg total) by mouth daily before breakfast. 90 capsule 3   lisinopril (ZESTRIL) 2.5 MG tablet Take 2.5 mg by mouth daily.     loperamide (IMODIUM) 2 MG capsule Take 1 capsule (2 mg total) by mouth 2 (two) times daily as needed for diarrhea or loose stools.     Milnacipran HCl (SAVELLA) 100 MG TABS tablet Take 100 mg by mouth 2 (two) times daily.     Multiple Vitamin (MULTIVITAMIN WITH MINERALS) TABS tablet Take 1 tablet by mouth daily.     polyethylene glycol powder (GLYCOLAX/MIRALAX) 17 GM/SCOOP powder Take 0.5 Containers by mouth daily as needed for mild constipation.     polyvinyl alcohol  (LIQUIFILM TEARS) 1.4 % ophthalmic solution Place 1 drop into both eyes 3 (three) times daily as needed for dry eyes.     promethazine (PHENERGAN) 12.5 MG tablet Take 12.5 mg by mouth every 8 (eight) hours as needed.     rizatriptan (MAXALT-MLT) 10 MG disintegrating tablet Take 10 mg by mouth as needed for migraine. May repeat in 2 hours if needed     silodosin (RAPAFLO) 8 MG CAPS capsule Take 8 mg by mouth at bedtime.      tiZANidine (ZANAFLEX) 2 MG tablet Take 2 mg by mouth 3 (three) times daily as needed.     topiramate (TOPAMAX) 100 MG tablet Take 1 tablet (100 mg total) by mouth 2 (two) times daily. 60 tablet 5   Travoprost, BAK Free, (TRAVATAN) 0.004 % SOLN ophthalmic solution Place 1 drop into both eyes at bedtime.      Evolocumab (REPATHA [place]) Inject into the skin. Every two weeks.     No facility-administered medications prior to visit.   Lab Results  Component Value Date   WBC 9.0 10/10/2021   HGB 12.6 10/10/2021   HCT 38.7 10/10/2021   PLT 207 10/10/2021   GLUCOSE 172 (H) 12/04/2020   ALT 13 (L) 07/04/2016   AST 12 (L) 07/04/2016   NA 140 12/04/2020   K 3.4 (L) 12/04/2020   CL 102 12/04/2020   CREATININE 0.80 12/04/2020   BUN 7 (L) 12/04/2020   CO2 23 12/04/2020   TSH 1.31 12/25/2015   INR 1.03  04/19/2013    --------------------------------------------------------------------------------------------------------------------- DG ESOPHAGUS W DOUBLE CM (HD)  Result Date: 08/05/2022 CLINICAL DATA:  Achalasia, proximal esophageal dysphagia, heartburn EXAM: ESOPHOGRAM / BARIUM SWALLOW / BARIUM TABLET STUDY TECHNIQUE: Combined double contrast and single contrast examination performed using effervescent crystals, thick barium liquid, and thin barium liquid. The patient was observed with fluoroscopy swallowing a 13 mm barium sulphate tablet. FLUOROSCOPY: Radiation Exposure Index (as provided by the fluoroscopic device): 62.1 mGy Kerma COMPARISON:  02/19/2022 FINDINGS: Dilated  esophagus with diffuse esophageal dysmotility. Incomplete clearance of barium by primary peristaltic waves with scattered secondary and tertiary waves noted. Significant gastroesophageal reflux noted. With patient standing/assistance of gravity, good clearance of contrast from the esophagus without abnormal retention. Small hiatal hernia identified. No mass or stricture seen. No laryngeal penetration or aspiration. 12.5 mm diameter barium tablet passed into the hiatal hernia without obstruction; the tablet did not pass across the diaphragm into the distal stomach despite multiple swallows of water and barium, question adherent to the wall, stomach did not appear narrowed at the diaphragmatic crus. Prior anterior and posterior cervical spine fusion procedures. Rugal fold thickening at the proximal d stomach corresponding to biopsy-proven gastritis from interval upper endoscopy. The small gastric diverticulum/outpouching identified at the area of rugal fold thickening on the previous study is less well demonstrated on current exam. IMPRESSION: Dilated esophagus with diffuse dysmotility. Small hiatal hernia with significant gastroesophageal reflux. No evidence of esophageal mass or obstruction. Significant rugal fold thickening at the proximal stomach unchanged, consistent with biopsy-proven gastritis on interval EGD. Electronically Signed   By: Ulyses Southward M.D.   On: 08/05/2022 09:25       ---------------------------------------------------------------------------------------------------------------------- Past Medical History:  Diagnosis Date   Anemia    Anxiety    Arthritis    "all over" (04/19/2013)   Asthma    Bleeding stomach ulcer 02/08/1979   Bloating    Chronic back pain    Chronic headaches    Chronic heartburn    Chronic neck pain    Chronic pain    Colon cancer (HCC)    DDD (degenerative disc disease), lumbar    Diabetic peripheral neuropathy (HCC)    "in my feet" (04/19/2013)    Dizziness    Dysphagia    Family history of anesthesia complication    "daughter has PONV too" (04/19/2013)   Fibromyalgia    Gastroesophageal reflux    Gastroparesis    Glaucoma, bilateral    Hiatal hernia    "had it before; had OR; got it again" (04/19/2013)   History of blood transfusion 02/08/1979   "w/bleeding stomach ulcer" (04/19/2013)   History of kidney stones    Migraine    "take RX for it qd" (04/19/2013)   Nausea    Pneumonia 04/09/2012   PONV (postoperative nausea and vomiting)    Renal insufficiency    TIA (transient ischemic attack)    "3-4 before starting RX; none since" (04/19/2013)   Type II diabetes mellitus (HCC)    Walking pneumonia ~ 1966    Past Surgical History:  Procedure Laterality Date   ABDOMINAL ADHESION SURGERY  ~ 2012   "repaired wrap where they did hiatal hernia OR too" 911/04/2013)   ANTERIOR CERVICAL DISCECTOMY  ~ 2009   "only cleaned out arthritis and spurs" (04/19/2013)   BIOPSY  01/09/2021   Procedure: BIOPSY;  Surgeon: Malissa Hippo, MD;  Location: [facility];  Service: Endoscopy;;  antrum; distal esophagus;   CATARACT EXTRACTION W/ INTRAOCULAR LENS IMPLANT Left  04/13/2013   CHOLECYSTECTOMY  1980's   COLON SURGERY     COLONOSCOPY  10/11/2010   COLONOSCOPY  11/23/2009   COLONOSCOPY  07/28/2008   W/SNARE   COLONOSCOPY  06/28/07   COLONOSCOPY  05/10/07   W/POLYP   COLONOSCOPY  12/28/00   COLONOSCOPY N/A 06/29/2015   Procedure: COLONOSCOPY;  Surgeon: Malissa Hippo, MD;  Location: [facility];  Service: Endoscopy;  Laterality: N/A;  135   COLONOSCOPY WITH ESOPHAGOGASTRODUODENOSCOPY (EGD) N/A 05/13/2013   Procedure: COLONOSCOPY WITH ESOPHAGOGASTRODUODENOSCOPY (EGD);  Surgeon: Malissa Hippo, MD;  [facility]: [facility];  Service: Endoscopy;  Laterality: N/A;  855   COLONOSCOPY WITH PROPOFOL N/A 01/09/2021   Procedure: COLONOSCOPY WITH PROPOFOL;  Surgeon: Malissa Hippo, MD;  [facility]: [facility];  Service: Endoscopy;   Laterality: N/A;  12:50   ESOPHAGOGASTRODUODENOSCOPY (EGD) WITH PROPOFOL N/A 10/06/2018   Procedure: ESOPHAGOGASTRODUODENOSCOPY (EGD) WITH PROPOFOL;  Surgeon: Malissa Hippo, MD;  [facility]: [facility];  Service: Endoscopy;  Laterality: N/A;   ESOPHAGOGASTRODUODENOSCOPY (EGD) WITH PROPOFOL N/A 01/09/2021   Procedure: ESOPHAGOGASTRODUODENOSCOPY (EGD) WITH PROPOFOL;  Surgeon: Malissa Hippo, MD;  [facility]: [facility];  Service: Endoscopy;  Laterality: N/A;   HEMICOLECTOMY  2010   ZIEGLER   HIATAL HERNIA REPAIR  1970's   LEFT HEART CATHETERIZATION WITH CORONARY ANGIOGRAM N/A 04/19/2013   Procedure: LEFT HEART CATHETERIZATION WITH CORONARY ANGIOGRAM;  Surgeon: Peter M [place], MD;  Location[facility];  Service: Cardiovascular;  Laterality: N/A;   POLYPECTOMY  10/06/2018   Procedure: POLYPECTOMY;  Surgeon: Malissa Hippo, MD;  [facility];  Service: Endoscopy;;  gastric   POLYPECTOMY  01/09/2021   Procedure: POLYPECTOMY;  Surgeon: Malissa Hippo, MD;  [facility]: [facility];  Service: Endoscopy;;  transverse;splenic flexure   POSTERIOR FUSION CERVICAL SPINE  1985   "had a broken neck" (04/19/2013)   TONSILLECTOMY  ~ 1953   TOOTH EXTRACTION Bilateral 12/04/2020   Procedure: BILATERAL TEMPOROMANDIBULAR JOINT ARTHROCENTESIS; DENTAL EXTRACTION TEETH #3,6,7,8,9,10,11,12,19,23,24,25,26,32 WITH ALVEOLOPLASTY;  Surgeon: Ocie Doyne, DMD;  Location: [facility];  Service: Oral Surgery;  Laterality: Bilateral;   UPPER GASTROINTESTINAL ENDOSCOPY  10/11/2010   EGD ED   UPPER GASTROINTESTINAL ENDOSCOPY  11/23/2009   UPPER GASTROINTESTINAL ENDOSCOPY  05/10/07   EGD ED   UPPER GASTROINTESTINAL ENDOSCOPY  08/11/01   EGD ED   VAGINAL HYSTERECTOMY      Family History  Problem Relation Age of Onset   Heart disease Mother    Diabetes Mother    Dementia Father    Healthy Sister    Diabetes Brother    Kidney cancer Brother    Diabetes Brother    Neuropathy Brother    Diabetes Daughter     Diabetes Daughter    Diabetes Son     Social History   Tobacco Use   Smoking status: Former    Packs/day: 1.50    Years: 15.00    Additional pack years: 0.00    Total pack years: 22.50    Types: Cigarettes    Quit date: 03/17/1991    Years since quitting: 31.7    Passive exposure: Past   Smokeless tobacco: Never   Tobacco comments:    Patient states that it has ben greater than 20 years since she quit smoking  Substance Use Topics   Alcohol use: No    Alcohol/week: 0.0 standard drinks of alcohol    ---------------------------------------------------------------------------------------------------------------------  Scheduled Meds: Continuous Infusions: PRN Meds:.   BP Marland Kitchen)  149/112   Pulse (!) 104   Temp (!) 97.1 F (36.2 C) (Temporal)   Resp 16   Ht 4\' 11"  (1.499 m)   Wt 134 lb (60.8 kg)   SpO2 96%   BMI 27.06 kg/m    BP Readings from Last 3 Encounters:  11/26/22 (!) 149/112  11/25/22 126/78  11/20/22 133/73     Wt Readings from Last 3 Encounters:  11/26/22 134 lb (60.8 kg)  11/25/22 132 lb 12.8 oz (60.2 kg)  11/20/22 125 lb (56.7 kg)     ----------------------------------------------------------------------------------------------------------------------  ROS Review of Systems She has occasional headaches primarily with tension nature with no migrainous component.  She has pain in the neck She denies angina or palpitations She denies any pleurisy or pleuritic chest pain no dyspnea She has chronic problems with abdominal pain but denies constipation or blood in the stool  Objective:  BP (!) 149/112   Pulse (!) 104   Temp (!) 97.1 F (36.2 C) (Temporal)   Resp 16   Ht 4\' 11"  (1.499 m)   Wt 134 lb (60.8 kg)   SpO2 96%   BMI 27.06 kg/m   Physical Exam Patient is a good historian she is alert cooperative compliant and with her daughter's. Heart is regular rate and rhythm without murmur Lungs are clear to auscultation without wheezing or  rales Inspection of the low back reveals some paraspinous muscle tenderness but no overt trigger points.  Inspection of the neck reveals a posterior midline incision with no evidence of erythema or edema.  She has some pain with extension lateral rotation at atlantooccipital joint.     Assessment & Plan:   [name] was seen today for muscle pain.  Diagnoses and all orders for this visit:  Cervical post-laminectomy syndrome -     ToxASSURE [place] 13 (MW), Urine  Fibromyalgia  SPONDYLOLITHESIS  Post-concussion headache  Spinal stenosis of lumbar region with neurogenic claudication  Chronic pain syndrome  Chronic bilateral low back pain without sciatica  Other orders -     traMADol (ULTRAM) 50 MG tablet; Take 1 tablet (50 mg total) by mouth 3 (three) times daily.     ----------------------------------------------------------------------------------------------------------------------  Problem List Items Addressed This Visit       Unprioritized   Cervical post-laminectomy syndrome - Primary   Relevant Orders   ToxASSURE [place] 13 (MW), Urine   Chronic low back pain   Relevant Medications   traMADol (ULTRAM) 50 MG tablet   Chronic pain syndrome   Relevant Medications   traMADol (ULTRAM) 50 MG tablet   Fibromyalgia   Relevant Medications   traMADol (ULTRAM) 50 MG tablet   Post-concussion headache   Relevant Medications   traMADol (ULTRAM) 50 MG tablet   Spinal stenosis of lumbar region   SPONDYLOLITHESIS    ----------------------------------------------------------------------------------------------------------------------  1. Cervical post-laminectomy syndrome With her chronic pain I think she would be a candidate to try on Ultram 1 tablet twice a day with increased to 3 times a day in 1 week.  We talked about the risks and benefits of using Ultram and potential for gastroparesis worsening and other side effects.  She would prefer to avoid other stronger opioids  at this time.  At present she is on appropriate medications including gabapentin duloxetine and acetaminophen - ToxASSURE [place] 13 (MW), Urine  2. Fibromyalgia I encouraged her to continue with stretching strengthening modalities at home and physical therapy modalities.  I think this is important for blood flow and muscle strength and stretching.  3. SPONDYLOLITHESIS As above  4. Post-concussion headache   5. Spinal stenosis of lumbar region with neurogenic claudication Continue with activity as tolerated especially for core strengthening  6. Chronic pain syndrome Will start tramadol today.  I gone over the risks and benefits of the medication and I reviewed the [facility] practitioner database information.  7. Chronic bilateral low back pain without sciatica     ----------------------------------------------------------------------------------------------------------------------  I am having [name] start on traMADol. I am also having her maintain her ALPRAZolam, acetaminophen, DULoxetine, silodosin, topiramate, Travoprost (BAK Free), multivitamin with minerals, polyvinyl alcohol, lisinopril, hydrocortisone, loperamide, Dapagliflozin Propanediol (FARXIGA PO), dicyclomine, famotidine, Erenumab-aooe (AIMOVIG [place]), Evolocumab (REPATHA [place]), linaclotide, Insulin Glargine-Lixisenatide (SOLIQUA [place]), dexlansoprazole, gabapentin, rizatriptan, cyanocobalamin, cholecalciferol, Milnacipran HCl, DSS, ascorbic acid, cetirizine, fluticasone, polyethylene glycol powder, promethazine, and tiZANidine.   Meds ordered this encounter  Medications   traMADol (ULTRAM) 50 MG tablet    Sig: Take 1 tablet (50 mg total) by mouth 3 (three) times daily.    Dispense:  90 tablet    Refill:  1       Follow-up: Return in about 1 month (around 12/26/2022) for evaluation, med refill.    Yevette Edwards, MD 4:09 PM  The [place] practitioner database for opioid medications on this patient has been  reviewed by me and my staff   Greater than 50% of the total encounter time was spent in counseling and / or coordination of care.     This dictation was performed utilizing Conservation officer, historic buildings.  Please excuse any unintentional or mistaken typographical errors as a result.

## 2022-12-01 LAB — TOXASSURE SELECT 13 (MW), URINE

## 2022-12-18 ENCOUNTER — Encounter: Payer: Self-pay | Admitting: Anesthesiology

## 2022-12-18 ENCOUNTER — Ambulatory Visit: Payer: Medicare Other | Attending: Anesthesiology | Admitting: Anesthesiology

## 2022-12-18 DIAGNOSIS — G894 Chronic pain syndrome: Secondary | ICD-10-CM

## 2022-12-18 DIAGNOSIS — Q762 Congenital spondylolisthesis: Secondary | ICD-10-CM

## 2022-12-18 DIAGNOSIS — G44309 Post-traumatic headache, unspecified, not intractable: Secondary | ICD-10-CM

## 2022-12-18 DIAGNOSIS — G8929 Other chronic pain: Secondary | ICD-10-CM

## 2022-12-18 DIAGNOSIS — M48062 Spinal stenosis, lumbar region with neurogenic claudication: Secondary | ICD-10-CM

## 2022-12-18 DIAGNOSIS — M797 Fibromyalgia: Secondary | ICD-10-CM

## 2022-12-18 DIAGNOSIS — M961 Postlaminectomy syndrome, not elsewhere classified: Secondary | ICD-10-CM

## 2022-12-18 MED ORDER — TRAMADOL HCL 50 MG PO TABS: 50.0000 mg | ORAL_TABLET | Freq: Three times a day (TID) | ORAL | Status: DC

## 2022-12-18 NOTE — Progress Notes (Signed)
Virtual Visit via Telephone Note  I connected with [name] on 12/18/22 at 11:00 AM EDT by telephone and verified that I am speaking with the correct person using two identifiers.  Location: Patient: Home Provider: [facility]   I discussed the limitations, risks, security and privacy concerns of performing an evaluation and management service by telephone and the availability of in person appointments. I also discussed with the patient that there may be a patient responsible charge related to this service. The patient expressed understanding and agreed to proceed.   History of Present Illness: I reached out to [name] today via telephone multiple times but a call blocker restricted to call.  We will make an effort to reach her in the near future.  I also tried the cell phone which was given to [place] as a secondary contact and the voicemail was full.   Observations/Objective:  Current Outpatient Medications:    acetaminophen (TYLENOL) 500 MG tablet, Take 500 mg by mouth every 6 (six) hours as needed for mild pain. , Disp: , Rfl:    ALPRAZolam (XANAX XR) 1 MG 24 hr tablet, Take 0.5-1 mg by mouth as needed for sleep (Anxiety). , Disp: , Rfl:    ascorbic acid (VITAMIN C) 500 MG tablet, Take 1,000 mg by mouth daily., Disp: , Rfl:    cetirizine (ZYRTEC) 10 MG tablet, Take 10 mg by mouth daily., Disp: , Rfl:    cholecalciferol (VITAMIN D3) 10 MCG (400 UNIT) TABS tablet, Take 5,000 Units by mouth daily., Disp: , Rfl:    cyanocobalamin (VITAMIN B12) 1000 MCG tablet, Take 1,000 mcg by mouth daily., Disp: , Rfl:    Dapagliflozin Propanediol (FARXIGA PO), Take 10 mg by mouth daily at 6 (six) AM., Disp: , Rfl:    dexlansoprazole (DEXILANT) 60 MG capsule, TAKE 1 CAPSULE BY MOUTH DAILY BEFORE BREAKFAST., Disp: 90 capsule, Rfl: 0   dicyclomine (BENTYL) 10 MG capsule, Take 1 capsule (10 mg total) by mouth every 12 (twelve) hours as needed for spasms (abdominal pain/discomfort)., Disp: 60  capsule, Rfl: 2   Docusate Sodium (DSS) 100 MG CAPS, Take 100 mg by mouth daily as needed., Disp: , Rfl:    DULoxetine (CYMBALTA) 60 MG capsule, Take 60 mg by mouth every morning. , Disp: , Rfl:    Erenumab-aooe (AIMOVIG [place]), Inject into the skin every 30 (thirty) days., Disp: , Rfl:    Evolocumab (REPATHA [place]), Inject into the skin. Every two weeks., Disp: , Rfl:    famotidine (PEPCID) 40 MG tablet, Take one po 30-45 minutes prior to supper., Disp: 90 tablet, Rfl: 3   fluticasone (FLONASE) 50 MCG/ACT nasal spray, Place 2 sprays into both nostrils daily., Disp: , Rfl:    gabapentin (NEURONTIN) 100 MG capsule, Take 2 capsules by mouth 2 (two) times daily., Disp: , Rfl:    hydrocortisone (ANUSOL-HC) 2.5 % rectal cream, APPLY 1 APPLICATION RECTALLY TWICE DAILY., Disp: 30 g, Rfl: 1   Insulin Glargine-Lixisenatide (SOLIQUA [place]), Inject 15 Units into the skin in the morning., Disp: , Rfl:    linaclotide (LINZESS) 72 MCG capsule, Take 1 capsule (72 mcg total) by mouth daily before breakfast., Disp: 90 capsule, Rfl: 3   lisinopril (ZESTRIL) 2.5 MG tablet, Take 2.5 mg by mouth daily., Disp: , Rfl:    loperamide (IMODIUM) 2 MG capsule, Take 1 capsule (2 mg total) by mouth 2 (two) times daily as needed for diarrhea or loose stools., Disp: , Rfl:    Milnacipran HCl (  SAVELLA) 100 MG TABS tablet, Take 100 mg by mouth 2 (two) times daily., Disp: , Rfl:    Multiple Vitamin (MULTIVITAMIN WITH MINERALS) TABS tablet, Take 1 tablet by mouth daily., Disp: , Rfl:    polyethylene glycol powder (GLYCOLAX/MIRALAX) 17 GM/SCOOP powder, Take 0.5 Containers by mouth daily as needed for mild constipation., Disp: , Rfl:    polyvinyl alcohol (LIQUIFILM TEARS) 1.4 % ophthalmic solution, Place 1 drop into both eyes 3 (three) times daily as needed for dry eyes., Disp: , Rfl:    promethazine (PHENERGAN) 12.5 MG tablet, Take 12.5 mg by mouth every 8 (eight) hours as needed., Disp: , Rfl:    rizatriptan (MAXALT-MLT) 10 MG disintegrating  tablet, Take 10 mg by mouth as needed for migraine. May repeat in 2 hours if needed, Disp: , Rfl:    silodosin (RAPAFLO) 8 MG CAPS capsule, Take 8 mg by mouth at bedtime. , Disp: , Rfl:    tiZANidine (ZANAFLEX) 2 MG tablet, Take 2 mg by mouth 3 (three) times daily as needed., Disp: , Rfl:    topiramate (TOPAMAX) 100 MG tablet, Take 1 tablet (100 mg total) by mouth 2 (two) times daily., Disp: 60 tablet, Rfl: 5   traMADol (ULTRAM) 50 MG tablet, Take 1 tablet (50 mg total) by mouth 3 (three) times daily., Disp: 90 tablet, Rfl: 00   Travoprost, BAK Free, (TRAVATAN) 0.004 % SOLN ophthalmic solution, Place 1 drop into both eyes at bedtime. , Disp: , Rfl:    Past Medical History:  Diagnosis Date   Anemia    Anxiety    Arthritis    "all over" (04/19/2013)   Asthma    Bleeding stomach ulcer 02/08/1979   Bloating    Chronic back pain    Chronic headaches    Chronic heartburn    Chronic neck pain    Chronic pain    Colon cancer (HCC)    DDD (degenerative disc disease), lumbar    Diabetic peripheral neuropathy (HCC)    "in my feet" (04/19/2013)   Dizziness    Dysphagia    Family history of anesthesia complication    "daughter has PONV too" (04/19/2013)   Fibromyalgia    Gastroesophageal reflux    Gastroparesis    Glaucoma, bilateral    Hiatal hernia    "had it before; had OR; got it again" (04/19/2013)   History of blood transfusion 02/08/1979   "w/bleeding stomach ulcer" (04/19/2013)   History of kidney stones    Migraine    "take RX for it qd" (04/19/2013)   Nausea    Pneumonia 04/09/2012   PONV (postoperative nausea and vomiting)    Renal insufficiency    TIA (transient ischemic attack)    "3-4 before starting RX; none since" (04/19/2013)   Type II diabetes mellitus (HCC)    Walking pneumonia ~ 1966     Assessment and Plan: 1. Cervical post-laminectomy syndrome   2. Fibromyalgia   3. SPONDYLOLITHESIS   4. 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 Chronic pain syndrome   7. Chronic bilateral low back pain without sciatica   Going to refill her tramadol for the next month and reset an appointment for 2 weeks for follow-up.  Follow Up Instructions:    I discussed the assessment and treatment plan with the patient. The patient was provided an opportunity to ask questions and all were answered. The patient agreed with the plan and demonstrated an understanding  of the instructions.   The patient was advised to call back or seek an in-person evaluation if the symptoms worsen or if the condition fails to improve as anticipated.  I provided 10 minutes of non-face-to-face time during this encounter.   Yevette Edwards, MD

## 2022-12-30 ENCOUNTER — Encounter: Payer: Medicare Other | Admitting: Anesthesiology

## 2023-01-06 ENCOUNTER — Ambulatory Visit: Payer: Medicare Other | Attending: Anesthesiology | Admitting: Anesthesiology

## 2023-01-06 ENCOUNTER — Encounter: Payer: Self-pay | Admitting: Anesthesiology

## 2023-01-06 VITALS — BP 165/80 | HR 54 | Temp 97.1°F | Ht 59.0 in | Wt 130.0 lb

## 2023-01-06 DIAGNOSIS — M7918 Myalgia, other site: Secondary | ICD-10-CM

## 2023-01-06 DIAGNOSIS — M48062 Spinal stenosis, lumbar region with neurogenic claudication: Secondary | ICD-10-CM | POA: Diagnosis not present

## 2023-01-06 DIAGNOSIS — M542 Cervicalgia: Secondary | ICD-10-CM | POA: Insufficient documentation

## 2023-01-06 DIAGNOSIS — G894 Chronic pain syndrome: Secondary | ICD-10-CM | POA: Insufficient documentation

## 2023-01-06 DIAGNOSIS — M961 Postlaminectomy syndrome, not elsewhere classified: Secondary | ICD-10-CM | POA: Diagnosis not present

## 2023-01-06 DIAGNOSIS — Q762 Congenital spondylolisthesis: Secondary | ICD-10-CM | POA: Insufficient documentation

## 2023-01-06 DIAGNOSIS — G44309 Post-traumatic headache, unspecified, not intractable: Secondary | ICD-10-CM | POA: Insufficient documentation

## 2023-01-06 DIAGNOSIS — M797 Fibromyalgia: Secondary | ICD-10-CM | POA: Diagnosis present

## 2023-01-06 DIAGNOSIS — M545 Low back pain, unspecified: Secondary | ICD-10-CM | POA: Insufficient documentation

## 2023-01-06 DIAGNOSIS — G8929 Other chronic pain: Secondary | ICD-10-CM | POA: Diagnosis present

## 2023-01-06 MED ORDER — TRAMADOL HCL 50 MG PO TABS: 100.0000 mg | ORAL_TABLET | Freq: Three times a day (TID) | ORAL | 1 refills | Status: DC

## 2023-01-06 NOTE — Patient Instructions (Signed)

## 2023-01-06 NOTE — Progress Notes (Signed)
Nursing Pain Medication Assessment:  Safety precautions to be maintained throughout the outpatient stay will include: orient to surroundings, keep bed in low position, maintain call bell within reach at all times, provide assistance with transfer out of bed and ambulation.  Medication Inspection Compliance: [name] did not comply with our request to bring her pills to be counted. She was reminded that bringing the medication bottles, even when empty, is a requirement.  Medication: Tramadol (Ultram) Pill/Patch Count:  did not bring pills to count Pill/Patch Appearance: No markings Bottle Appearance: No container available. Did not bring bottle(s) to appointment. Filled Date: ? / ? / 2024 Last Medication intake:  Today

## 2023-01-13 ENCOUNTER — Encounter: Payer: Self-pay | Admitting: Anesthesiology

## 2023-01-13 NOTE — Progress Notes (Signed)
Subjective:  Patient ID: [name], female    DOB: Dec 10, 1940  Age: 82 y.o. MRN: [id number]  CC: Fibromyalgia and Headache   Procedure: None  HPI [name] presents for reevaluation.  She was recently seen in clinic and started on tramadol for some cervical pain and occasional low back pain.  The pain is the primary pain complaint and associated tension headaches.  The tramadol is given her about 40% pain relief taking this 3 times a day.  She tolerates the medication without side effect and seems to be doing well with that.  She still has some spasming pain in the peripheral sides of the trapezius muscle but no associated weakness in the upper extremities with some associated pain on movement about the neck.  The headache she has are primarily tension type headaches with no focal neurologic symptoms described.  Outpatient Medications Prior to Visit  Medication Sig Dispense Refill   acetaminophen (TYLENOL) 500 MG tablet Take 500 mg by mouth every 6 (six) hours as needed for mild pain.      ALPRAZolam (XANAX XR) 1 MG 24 hr tablet Take 0.5-1 mg by mouth as needed for sleep (Anxiety).      ascorbic acid (VITAMIN C) 500 MG tablet Take 1,000 mg by mouth daily.     cetirizine (ZYRTEC) 10 MG tablet Take 10 mg by mouth daily.     cholecalciferol (VITAMIN D3) 10 MCG (400 UNIT) TABS tablet Take 5,000 Units by mouth daily.     cyanocobalamin (VITAMIN B12) 1000 MCG tablet Take 1,000 mcg by mouth daily.     Dapagliflozin Propanediol (FARXIGA PO) Take 10 mg by mouth daily at 6 (six) AM.     dexlansoprazole (DEXILANT) 60 MG capsule TAKE 1 CAPSULE BY MOUTH DAILY BEFORE BREAKFAST. 90 capsule 0   dicyclomine (BENTYL) 10 MG capsule Take 1 capsule (10 mg total) by mouth every 12 (twelve) hours as needed for spasms (abdominal pain/discomfort). 60 capsule 2   Docusate Sodium (DSS) 100 MG CAPS Take 100 mg by mouth daily as needed.     DULoxetine (CYMBALTA) 60 MG capsule Take 60 mg by mouth every  morning.      Erenumab-aooe (AIMOVIG [place]) Inject into the skin every 30 (thirty) days.     Evolocumab (REPATHA [place]) Inject into the skin. Every two weeks.     famotidine (PEPCID) 40 MG tablet Take one po 30-45 minutes prior to supper. 90 tablet 3   fluticasone (FLONASE) 50 MCG/ACT nasal spray Place 2 sprays into both nostrils daily.     gabapentin (NEURONTIN) 100 MG capsule Take 2 capsules by mouth 2 (two) times daily.     hydrocortisone (ANUSOL-HC) 2.5 % rectal cream APPLY 1 APPLICATION RECTALLY TWICE DAILY. 30 g 1   Insulin Glargine-Lixisenatide (SOLIQUA ) Inject 15 Units into the skin in the morning.     linaclotide (LINZESS) 72 MCG capsule Take 1 capsule (72 mcg total) by mouth daily before breakfast. 90 capsule 3   lisinopril (ZESTRIL) 2.5 MG tablet Take 2.5 mg by mouth daily.     loperamide (IMODIUM) 2 MG capsule Take 1 capsule (2 mg total) by mouth 2 (two) times daily as needed for diarrhea or loose stools.     Milnacipran HCl (SAVELLA) 100 MG TABS tablet Take 100 mg by mouth 2 (two) times daily.     Multiple Vitamin (MULTIVITAMIN WITH MINERALS) TABS tablet Take 1 tablet by mouth daily.     polyethylene glycol powder (GLYCOLAX/MIRALAX) 17 GM/SCOOP powder  Take 0.5 Containers by mouth daily as needed for mild constipation.     polyvinyl alcohol (LIQUIFILM TEARS) 1.4 % ophthalmic solution Place 1 drop into both eyes 3 (three) times daily as needed for dry eyes.     promethazine (PHENERGAN) 12.5 MG tablet Take 12.5 mg by mouth every 8 (eight) hours as needed.     rizatriptan (MAXALT-MLT) 10 MG disintegrating tablet Take 10 mg by mouth as needed for migraine. May repeat in 2 hours if needed     silodosin (RAPAFLO) 8 MG CAPS capsule Take 8 mg by mouth at bedtime.      tiZANidine (ZANAFLEX) 2 MG tablet Take 2 mg by mouth 3 (three) times daily as needed.     topiramate (TOPAMAX) 100 MG tablet Take 1 tablet (100 mg total) by mouth 2 (two) times daily. 60 tablet 5   Travoprost, BAK Free, (TRAVATAN)  0.004 % SOLN ophthalmic solution Place 1 drop into both eyes at bedtime.      traMADol (ULTRAM) 50 MG tablet Take 1 tablet (50 mg total) by mouth 3 (three) times daily. 90 tablet 00   No facility-administered medications prior to visit.    Review of Systems CNS: No confusion or sedation Cardiac: No angina or palpitations GI: No abdominal pain or constipation Constitutional: No nausea vomiting fevers or chills  Objective:  BP (!) 165/80   Pulse (!) 54   Temp (!) 97.1 F (36.2 C) (Temporal)   Ht 4\' 11"  (1.499 m)   Wt 130 lb (59 kg)   SpO2 100%   BMI 26.26 kg/m    BP Readings from Last 3 Encounters:  01/06/23 (!) 165/80  11/26/22 (!) 149/112  11/25/22 126/78     Wt Readings from Last 3 Encounters:  01/06/23 130 lb (59 kg)  11/26/22 134 lb (60.8 kg)  11/25/22 132 lb 12.8 oz (60.2 kg)     Physical Exam Pt is alert and oriented PERRL EOMI HEART IS RRR no murmur or rub LCTA no wheezing or rales MUSCULOSKELETAL reveals some tenderness in the bilateral trapezius muscle with 2 associated trigger points noted.  She does have good range of motion at the left occipital joint.  Upper extremity strength appears to be intact both proximal distal to the bicep tricep and grip.  Labs  No results found for: "HGBA1C" Lab Results  Component Value Date   CREATININE 0.80 12/04/2020    -------------------------------------------------------------------------------------------------------------------- Lab Results  Component Value Date   WBC 9.0 10/10/2021   HGB 12.6 10/10/2021   HCT 38.7 10/10/2021   PLT 207 10/10/2021   GLUCOSE 172 (H) 12/04/2020   ALT 13 (L) 07/04/2016   AST 12 (L) 07/04/2016   NA 140 12/04/2020   K 3.4 (L) 12/04/2020   CL 102 12/04/2020   CREATININE 0.80 12/04/2020   BUN 7 (L) 12/04/2020   CO2 23 12/04/2020   TSH 1.31 12/25/2015   INR 1.03 04/19/2013     --------------------------------------------------------------------------------------------------------------------- DG ESOPHAGUS W DOUBLE CM (HD)  Result Date: 08/05/2022 CLINICAL DATA:  Achalasia, proximal esophageal dysphagia, heartburn EXAM: ESOPHOGRAM / BARIUM SWALLOW / BARIUM TABLET STUDY TECHNIQUE: Combined double contrast and single contrast examination performed using effervescent crystals, thick barium liquid, and thin barium liquid. The patient was observed with fluoroscopy swallowing a 13 mm barium sulphate tablet. FLUOROSCOPY: Radiation Exposure Index (as provided by the fluoroscopic device): 62.1 mGy Kerma COMPARISON:  02/19/2022 FINDINGS: Dilated esophagus with diffuse esophageal dysmotility. Incomplete clearance of barium by primary peristaltic waves with scattered secondary and tertiary  waves noted. Significant gastroesophageal reflux noted. With patient standing/assistance of gravity, good clearance of contrast from the esophagus without abnormal retention. Small hiatal hernia identified. No mass or stricture seen. No laryngeal penetration or aspiration. 12.5 mm diameter barium tablet passed into the hiatal hernia without obstruction; the tablet did not pass across the diaphragm into the distal stomach despite multiple swallows of water and barium, question adherent to the wall, stomach did not appear narrowed at the diaphragmatic crus. Prior anterior and posterior cervical spine fusion procedures. Rugal fold thickening at the proximal d stomach corresponding to biopsy-proven gastritis from interval upper endoscopy. The small gastric diverticulum/outpouching identified at the area of rugal fold thickening on the previous study is less well demonstrated on current exam. IMPRESSION: Dilated esophagus with diffuse dysmotility. Small hiatal hernia with significant gastroesophageal reflux. No evidence of esophageal mass or obstruction. Significant rugal fold thickening at the proximal stomach  unchanged, consistent with biopsy-proven gastritis on interval EGD. Electronically Signed   By: Ulyses Southward M.D.   On: 08/05/2022 09:25     Assessment & Plan:   Amija was seen today for fibromyalgia and headache.  Diagnoses and all orders for this visit:  Cervical post-laminectomy syndrome -     INJECT TRIGGER POINT, 1 OR 2  Fibromyalgia  SPONDYLOLITHESIS  Post-concussion headache  Spinal stenosis of lumbar region with neurogenic claudication  Chronic pain syndrome -     INJECT TRIGGER POINT, 1 OR 2  Chronic bilateral low back pain without sciatica  Cervicalgia -     INJECT TRIGGER POINT, 1 OR 2  Cervical myofascial pain syndrome -     INJECT TRIGGER POINT, 1 OR 2  Other orders -     traMADol (ULTRAM) 50 MG tablet; Take 2 tablets (100 mg total) by mouth 3 (three) times daily.        ----------------------------------------------------------------------------------------------------------------------  Problem List Items Addressed This Visit       Unprioritized   Cervical post-laminectomy syndrome - Primary   Relevant Orders   INJECT TRIGGER POINT, 1 OR 2   Chronic low back pain   Relevant Medications   traMADol (ULTRAM) 50 MG tablet (Start on 01/19/2023)   Chronic pain syndrome   Relevant Medications   traMADol (ULTRAM) 50 MG tablet (Start on 01/19/2023)   Other Relevant Orders   INJECT TRIGGER POINT, 1 OR 2   Fibromyalgia   Relevant Medications   traMADol (ULTRAM) 50 MG tablet (Start on 01/19/2023)   Post-concussion headache   Relevant Medications   traMADol (ULTRAM) 50 MG tablet (Start on 01/19/2023)   Spinal stenosis of lumbar region   SPONDYLOLITHESIS   Other Visit Diagnoses     Cervicalgia       Relevant Orders   INJECT TRIGGER POINT, 1 OR 2   Cervical myofascial pain syndrome       Relevant Medications   traMADol (ULTRAM) 50 MG tablet (Start on 01/19/2023)   Other Relevant Orders   INJECT TRIGGER POINT, 1 OR 2          ----------------------------------------------------------------------------------------------------------------------  1. Cervical post-laminectomy syndrome I schedule her for a cervical trigger point injection at her next visit.  We gone over the risks and benefits of the procedure with her and hopefully this will help with some of her the cervical neck pain that she is continue to experience. - INJECT TRIGGER POINT, 1 OR 2  2. Fibromyalgia I am also going to increase her to 1 or 2 tablets 3 times a day to see  if the increased dose of tramadol will help with the persistent pain.  Should she have side effects she is instructed to decrease this to 1 tablet 4 times a day.  She is also instructed to contact us here the pain center for further guidance if necessary.  3. SPONDYLOLITHESIS As above  4. Post-concussion headache As above.  She is aware that we do not treat specific headaches other than tension headaches and should these headaches persist she is instructed to refer to her primary care physician for further evaluation.  5. Spinal stenosis of lumbar region with neurogenic claudication Continue with the low back stretching exercises.  6. Chronic pain syndrome As above - INJECT TRIGGER POINT, 1 OR 2  7. Chronic bilateral low back pain without sciatica     8. Cervicalgia  - INJECT TRIGGER POINT, 1 OR 2  9. Cervical myofascial pain syndrome  - INJECT TRIGGER POINT, 1 OR 2    ----------------------------------------------------------------------------------------------------------------------  I have changed [name]'s traMADol. I am also having her maintain her ALPRAZolam, acetaminophen, DULoxetine, silodosin, topiramate, Travoprost (BAK Free), multivitamin with minerals, polyvinyl alcohol, lisinopril, hydrocortisone, loperamide, Dapagliflozin Propanediol (FARXIGA PO), dicyclomine, famotidine, Erenumab-aooe (AIMOVIG [place]), Evolocumab (REPATHA [place]), linaclotide,  Insulin Glargine-Lixisenatide (SOLIQUA [place]), dexlansoprazole, gabapentin, rizatriptan, cyanocobalamin, cholecalciferol, Milnacipran HCl, DSS, ascorbic acid, cetirizine, fluticasone, polyethylene glycol powder, promethazine, and tiZANidine.   Meds ordered this encounter  Medications   traMADol (ULTRAM) 50 MG tablet    Sig: Take 2 tablets (100 mg total) by mouth 3 (three) times daily.    Dispense:  180 tablet    Refill:  1   Patient's Medications  New Prescriptions   No medications on file  Previous Medications   ACETAMINOPHEN (TYLENOL) 500 MG TABLET    Take 500 mg by mouth every 6 (six) hours as needed for mild pain.    ALPRAZOLAM (XANAX XR) 1 MG 24 HR TABLET    Take 0.5-1 mg by mouth as needed for sleep (Anxiety).    ASCORBIC ACID (VITAMIN C) 500 MG TABLET    Take 1,000 mg by mouth daily.   CETIRIZINE (ZYRTEC) 10 MG TABLET    Take 10 mg by mouth daily.   CHOLECALCIFEROL (VITAMIN D3) 10 MCG (400 UNIT) TABS TABLET    Take 5,000 Units by mouth daily.   CYANOCOBALAMIN (VITAMIN B12) 1000 MCG TABLET    Take 1,000 mcg by mouth daily.   DAPAGLIFLOZIN PROPANEDIOL (FARXIGA PO)    Take 10 mg by mouth daily at 6 (six) AM.   DEXLANSOPRAZOLE (DEXILANT) 60 MG CAPSULE    TAKE 1 CAPSULE BY MOUTH DAILY BEFORE BREAKFAST.   DICYCLOMINE (BENTYL) 10 MG CAPSULE    Take 1 capsule (10 mg total) by mouth every 12 (twelve) hours as needed for spasms (abdominal pain/discomfort).   DOCUSATE SODIUM (DSS) 100 MG CAPS    Take 100 mg by mouth daily as needed.   DULOXETINE (CYMBALTA) 60 MG CAPSULE    Take 60 mg by mouth every morning.    ERENUMAB-AOOE (AIMOVIG [place])    Inject into the skin every 30 (thirty) days.   EVOLOCUMAB (REPATHA )    Inject into the skin. Every two weeks.   FAMOTIDINE (PEPCID) 40 MG TABLET    Take one po 30-45 minutes prior to supper.   FLUTICASONE (FLONASE) 50 MCG/ACT NASAL SPRAY    Place 2 sprays into both nostrils daily.   GABAPENTIN (NEURONTIN) 100 MG CAPSULE    Take 2 capsules by mouth 2 (two)  times daily.  HYDROCORTISONE (ANUSOL-HC) 2.5 % RECTAL CREAM    APPLY 1 APPLICATION RECTALLY TWICE DAILY.   INSULIN GLARGINE-LIXISENATIDE (SOLIQUA [place])    Inject 15 Units into the skin in the morning.   LINACLOTIDE (LINZESS) 72 MCG CAPSULE    Take 1 capsule (72 mcg total) by mouth daily before breakfast.   LISINOPRIL (ZESTRIL) 2.5 MG TABLET    Take 2.5 mg by mouth daily.   LOPERAMIDE (IMODIUM) 2 MG CAPSULE    Take 1 capsule (2 mg total) by mouth 2 (two) times daily as needed for diarrhea or loose stools.   MILNACIPRAN HCL (SAVELLA) 100 MG TABS TABLET    Take 100 mg by mouth 2 (two) times daily.   MULTIPLE VITAMIN (MULTIVITAMIN WITH MINERALS) TABS TABLET    Take 1 tablet by mouth daily.   POLYETHYLENE GLYCOL POWDER (GLYCOLAX/MIRALAX) 17 GM/SCOOP POWDER    Take 0.5 Containers by mouth daily as needed for mild constipation.   POLYVINYL ALCOHOL (LIQUIFILM TEARS) 1.4 % OPHTHALMIC SOLUTION    Place 1 drop into both eyes 3 (three) times daily as needed for dry eyes.   PROMETHAZINE (PHENERGAN) 12.5 MG TABLET    Take 12.5 mg by mouth every 8 (eight) hours as needed.   RIZATRIPTAN (MAXALT-MLT) 10 MG DISINTEGRATING TABLET    Take 10 mg by mouth as needed for migraine. May repeat in 2 hours if needed   SILODOSIN (RAPAFLO) 8 MG CAPS CAPSULE    Take 8 mg by mouth at bedtime.    TIZANIDINE (ZANAFLEX) 2 MG TABLET    Take 2 mg by mouth 3 (three) times daily as needed.   TOPIRAMATE (TOPAMAX) 100 MG TABLET    Take 1 tablet (100 mg total) by mouth 2 (two) times daily.   TRAVOPROST, BAK FREE, (TRAVATAN) 0.004 % SOLN OPHTHALMIC SOLUTION    Place 1 drop into both eyes at bedtime.   Modified Medications   Modified Medication Previous Medication   TRAMADOL (ULTRAM) 50 MG TABLET traMADol (ULTRAM) 50 MG tablet      Take 2 tablets (100 mg total) by mouth 3 (three) times daily.    Take 1 tablet (50 mg total) by mouth 3 (three) times daily.  Discontinued Medications   No medications on file    ----------------------------------------------------------------------------------------------------------------------  Follow-up: Return in about 1 month (around 02/06/2023) for evaluation, procedure.    Yevette Edwards, MD

## 2023-02-03 ENCOUNTER — Encounter: Payer: Self-pay | Admitting: Anesthesiology

## 2023-02-03 ENCOUNTER — Other Ambulatory Visit: Payer: Self-pay | Admitting: Anesthesiology

## 2023-02-03 ENCOUNTER — Ambulatory Visit
Admission: RE | Admit: 2023-02-03 | Discharge: 2023-02-03 | Disposition: A | Discharge status: Home / Self Care | Payer: Medicare Other | Source: Ambulatory Visit | Attending: Anesthesiology | Admitting: Anesthesiology

## 2023-02-03 ENCOUNTER — Ambulatory Visit (HOSPITAL_BASED_OUTPATIENT_CLINIC_OR_DEPARTMENT_OTHER): Payer: Medicare Other | Admitting: Anesthesiology

## 2023-02-03 VITALS — BP 145/85 | HR 59 | Temp 97.3°F | Resp 16 | Ht 59.0 in | Wt 125.0 lb

## 2023-02-03 DIAGNOSIS — M7918 Myalgia, other site: Secondary | ICD-10-CM

## 2023-02-03 DIAGNOSIS — G894 Chronic pain syndrome: Secondary | ICD-10-CM | POA: Insufficient documentation

## 2023-02-03 DIAGNOSIS — Q762 Congenital spondylolisthesis: Secondary | ICD-10-CM

## 2023-02-03 DIAGNOSIS — M542 Cervicalgia: Secondary | ICD-10-CM | POA: Insufficient documentation

## 2023-02-03 DIAGNOSIS — M797 Fibromyalgia: Secondary | ICD-10-CM | POA: Diagnosis present

## 2023-02-03 DIAGNOSIS — M961 Postlaminectomy syndrome, not elsewhere classified: Secondary | ICD-10-CM | POA: Insufficient documentation

## 2023-02-03 DIAGNOSIS — R52 Pain, unspecified: Secondary | ICD-10-CM | POA: Diagnosis present

## 2023-02-03 DIAGNOSIS — M48062 Spinal stenosis, lumbar region with neurogenic claudication: Secondary | ICD-10-CM

## 2023-02-03 MED ORDER — ROPIVACAINE HCL 2 MG/ML IJ SOLN
INTRAMUSCULAR | Status: AC
  Filled 2023-02-03: qty 20

## 2023-02-03 MED ORDER — DEXAMETHASONE SODIUM PHOSPHATE 10 MG/ML IJ SOLN
INTRAMUSCULAR | Status: AC
  Filled 2023-02-03: qty 1

## 2023-02-03 MED ORDER — ROPIVACAINE HCL 2 MG/ML IJ SOLN: 10.0000 mL | Freq: Once | INTRAMUSCULAR | Status: DC

## 2023-02-03 MED ORDER — DEXAMETHASONE SODIUM PHOSPHATE 10 MG/ML IJ SOLN
10.0000 mg | Freq: Once | INTRAMUSCULAR | Status: AC
  Administered 2023-02-03: 10 mg

## 2023-02-03 NOTE — Patient Instructions (Signed)

## 2023-02-03 NOTE — Progress Notes (Unsigned)
Nursing Pain Medication Assessment:  Safety precautions to be maintained throughout the outpatient stay will include: orient to surroundings, keep bed in low position, maintain call bell within reach at all times, provide assistance with transfer out of bed and ambulation.  Medication Inspection Compliance: Pill count conducted under aseptic conditions, in front of the patient. Neither the pills nor the bottle was removed from the patient's sight at any time. Once count was completed pills were immediately returned to the patient in their original bottle.  Medication: See above Pill/Patch Count:  121 of 180 pills remain Pill/Patch Appearance: Markings consistent with prescribed medication Bottle Appearance: Standard pharmacy container. Clearly labeled. Filled Date: 08 / 12 2024 Last Medication intake:  Today

## 2023-02-04 ENCOUNTER — Encounter: Payer: Self-pay | Admitting: Anesthesiology

## 2023-02-04 ENCOUNTER — Telehealth: Payer: Self-pay

## 2023-02-04 NOTE — Telephone Encounter (Signed)
Post procedure follow up.  LM 

## 2023-02-04 NOTE — Progress Notes (Signed)
Subjective:  Patient ID: [name], female    DOB: 18-Dec-1940  Age: 82 y.o. MRN: [id number]  CC: Back Pain, Headache, and Neck Pain   Procedure: Trigger point injection x 2 to the bilateral trapezius muscle  HPI [name] presents for reevaluation.  She continues to have pain in the cervical region and the midline posterior cervical region.  This is comparable to what she has had previously.  This has been a chronic ongoing condition for her.  She has had previous neck posterior spinal surgery and previous traumatic injury to her of her cervical spine.  She denies any change to upper extremity strength or function at this time but just has ongoing tension headaches radiating from the mid posterior cervical and occipital region in a bandlike distribution that are intermittent in nature and chronic pain in the bilateral musculature around the neck and trapezius area.  Outpatient Medications Prior to Visit  Medication Sig Dispense Refill   acetaminophen (TYLENOL) 500 MG tablet Take 500 mg by mouth every 6 (six) hours as needed for mild pain.      ALPRAZolam (XANAX XR) 1 MG 24 hr tablet Take 0.5-1 mg by mouth as needed for sleep (Anxiety).      ascorbic acid (VITAMIN C) 500 MG tablet Take 1,000 mg by mouth daily.     cetirizine (ZYRTEC) 10 MG tablet Take 10 mg by mouth daily.     cholecalciferol (VITAMIN D3) 10 MCG (400 UNIT) TABS tablet Take 5,000 Units by mouth daily.     cyanocobalamin (VITAMIN B12) 1000 MCG tablet Take 1,000 mcg by mouth daily.     Dapagliflozin Propanediol (FARXIGA PO) Take 10 mg by mouth daily at 6 (six) AM.     dexlansoprazole (DEXILANT) 60 MG capsule TAKE 1 CAPSULE BY MOUTH DAILY BEFORE BREAKFAST. 90 capsule 0   dicyclomine (BENTYL) 10 MG capsule Take 1 capsule (10 mg total) by mouth every 12 (twelve) hours as needed for spasms (abdominal pain/discomfort). 60 capsule 2   Docusate Sodium (DSS) 100 MG CAPS Take 100 mg by mouth daily as needed.     DULoxetine  (CYMBALTA) 60 MG capsule Take 60 mg by mouth every morning.      Erenumab-aooe (AIMOVIG [place]) Inject into the skin every 30 (thirty) days.     Evolocumab (REPATHA [place]) Inject into the skin. Every two weeks.     famotidine (PEPCID) 40 MG tablet Take one po 30-45 minutes prior to supper. 90 tablet 3   fluticasone (FLONASE) 50 MCG/ACT nasal spray Place 2 sprays into both nostrils daily.     gabapentin (NEURONTIN) 100 MG capsule Take 2 capsules by mouth 2 (two) times daily.     hydrocortisone (ANUSOL-HC) 2.5 % rectal cream APPLY 1 APPLICATION RECTALLY TWICE DAILY. 30 g 1   Insulin Glargine-Lixisenatide (SOLIQUA [place]) Inject 15 Units into the skin in the morning.     linaclotide (LINZESS) 72 MCG capsule Take 1 capsule (72 mcg total) by mouth daily before breakfast. 90 capsule 3   lisinopril (ZESTRIL) 2.5 MG tablet Take 2.5 mg by mouth daily.     loperamide (IMODIUM) 2 MG capsule Take 1 capsule (2 mg total) by mouth 2 (two) times daily as needed for diarrhea or loose stools.     Milnacipran HCl (SAVELLA) 100 MG TABS tablet Take 100 mg by mouth 2 (two) times daily.     Multiple Vitamin (MULTIVITAMIN WITH MINERALS) TABS tablet Take 1 tablet by mouth daily.     polyethylene  glycol powder (GLYCOLAX/MIRALAX) 17 GM/SCOOP powder Take 0.5 Containers by mouth daily as needed for mild constipation.     polyvinyl alcohol (LIQUIFILM TEARS) 1.4 % ophthalmic solution Place 1 drop into both eyes 3 (three) times daily as needed for dry eyes.     promethazine (PHENERGAN) 12.5 MG tablet Take 12.5 mg by mouth every 8 (eight) hours as needed.     rizatriptan (MAXALT-MLT) 10 MG disintegrating tablet Take 10 mg by mouth as needed for migraine. May repeat in 2 hours if needed     silodosin (RAPAFLO) 8 MG CAPS capsule Take 8 mg by mouth at bedtime.      tiZANidine (ZANAFLEX) 2 MG tablet Take 2 mg by mouth 3 (three) times daily as needed.     topiramate (TOPAMAX) 100 MG tablet Take 1 tablet (100 mg total) by mouth 2 (two) times  daily. 60 tablet 5   traMADol (ULTRAM) 50 MG tablet Take 2 tablets (100 mg total) by mouth 3 (three) times daily. 180 tablet 1   Travoprost, BAK Free, (TRAVATAN) 0.004 % SOLN ophthalmic solution Place 1 drop into both eyes at bedtime.      No facility-administered medications prior to visit.    Review of Systems CNS: No confusion or sedation Cardiac: No angina or palpitations GI: No abdominal pain or constipation Constitutional: No nausea vomiting fevers or chills  Objective:  BP (!) 145/85   Pulse (!) 59   Temp (!) 97.3 F (36.3 C)   Resp 16   Ht 4\' 11"  (1.499 m)   Wt 125 lb (56.7 kg)   SpO2 100%   BMI 25.25 kg/m    BP Readings from Last 3 Encounters:  02/03/23 (!) 145/85  01/06/23 (!) 165/80  11/26/22 (!) 149/112     Wt Readings from Last 3 Encounters:  02/03/23 125 lb (56.7 kg)  01/06/23 130 lb (59 kg)  11/26/22 134 lb (60.8 kg)     Physical Exam Pt is alert and oriented PERRL EOMI HEART IS RRR no murmur or rub LCTA no wheezing or rales MUSCULOSKELETAL tenderness over the mid cervical posterior scar which is well-healed.  She does have limited range of motion at the atlantooccipital joint and 2 trigger points in the bilateral trapezius musculature.  Labs  No results found for: "HGBA1C" Lab Results  Component Value Date   CREATININE 0.80 12/04/2020    -------------------------------------------------------------------------------------------------------------------- Lab Results  Component Value Date   WBC 9.0 10/10/2021   HGB 12.6 10/10/2021   HCT 38.7 10/10/2021   PLT 207 10/10/2021   GLUCOSE 172 (H) 12/04/2020   ALT 13 (L) 07/04/2016   AST 12 (L) 07/04/2016   NA 140 12/04/2020   K 3.4 (L) 12/04/2020   CL 102 12/04/2020   CREATININE 0.80 12/04/2020   BUN 7 (L) 12/04/2020   CO2 23 12/04/2020   TSH 1.31 12/25/2015   INR 1.03 04/19/2013     --------------------------------------------------------------------------------------------------------------------- No results found.   Assessment & Plan:   [name] was seen today for back pain, headache and neck pain.  Diagnoses and all orders for this visit:  Cervical post-laminectomy syndrome  Fibromyalgia  SPONDYLOLITHESIS  Spinal stenosis of lumbar region with neurogenic claudication  Cervical myofascial pain syndrome  Cervicalgia  Chronic pain syndrome  Other orders -     dexamethasone (DECADRON) injection 10 mg -     ropivacaine (PF) 2 mg/mL (0.2%) (NAROPIN) injection 10 mL        ----------------------------------------------------------------------------------------------------------------------  Problem List Items Addressed This Visit  Unprioritized   Cervical post-laminectomy syndrome - Primary   Chronic pain syndrome   Relevant Medications   ropivacaine (PF) 2 mg/mL (0.2%) (NAROPIN) injection 10 mL   Fibromyalgia   Relevant Medications   ropivacaine (PF) 2 mg/mL (0.2%) (NAROPIN) injection 10 mL   Spinal stenosis of lumbar region   SPONDYLOLITHESIS   Other Visit Diagnoses     Cervical myofascial pain syndrome       Relevant Medications   dexamethasone (DECADRON) injection 10 mg (Completed)   ropivacaine (PF) 2 mg/mL (0.2%) (NAROPIN) injection 10 mL   Cervicalgia             ----------------------------------------------------------------------------------------------------------------------  1. Cervical post-laminectomy syndrome Continue core stretching and current medication management.  2. Fibromyalgia As above  3. SPONDYLOLITHESIS   4. Spinal stenosis of lumbar region with neurogenic claudication   5. Cervical myofascial pain syndrome Will proceed with bilateral trigger point injections today.  The risks and benefits of been reviewed.  Will schedule her for return to clinic in 2 to 3 weeks for possible repeat  injection.  Unfortunately, secondary to the posterior cervical incision I do not feel she is a good candidate for either injection therapy or dorsal column stimulator therapy.  6. Cervicalgia As above  7. Chronic pain syndrome     ----------------------------------------------------------------------------------------------------------------------  I am having [name] maintain her ALPRAZolam, acetaminophen, DULoxetine, silodosin, topiramate, Travoprost (BAK Free), multivitamin with minerals, polyvinyl alcohol, lisinopril, hydrocortisone, loperamide, Dapagliflozin Propanediol (FARXIGA PO), dicyclomine, famotidine, Erenumab-aooe (AIMOVIG [place]), Evolocumab (REPATHA [place]), linaclotide, Insulin Glargine-Lixisenatide (SOLIQUA [place]), dexlansoprazole, gabapentin, rizatriptan, cyanocobalamin, cholecalciferol, Milnacipran HCl, DSS, ascorbic acid, cetirizine, fluticasone, polyethylene glycol powder, promethazine, tiZANidine, and traMADol. We administered dexamethasone.   Meds ordered this encounter  Medications   dexamethasone (DECADRON) injection 10 mg   ropivacaine (PF) 2 mg/mL (0.2%) (NAROPIN) injection 10 mL   Patient's Medications  New Prescriptions   No medications on file  Previous Medications   ACETAMINOPHEN (TYLENOL) 500 MG TABLET    Take 500 mg by mouth every 6 (six) hours as needed for mild pain.    ALPRAZOLAM (XANAX XR) 1 MG 24 HR TABLET    Take 0.5-1 mg by mouth as needed for sleep (Anxiety).    ASCORBIC ACID (VITAMIN C) 500 MG TABLET    Take 1,000 mg by mouth daily.   CETIRIZINE (ZYRTEC) 10 MG TABLET    Take 10 mg by mouth daily.   CHOLECALCIFEROL (VITAMIN D3) 10 MCG (400 UNIT) TABS TABLET    Take 5,000 Units by mouth daily.   CYANOCOBALAMIN (VITAMIN B12) 1000 MCG TABLET    Take 1,000 mcg by mouth daily.   DAPAGLIFLOZIN PROPANEDIOL (FARXIGA PO)    Take 10 mg by mouth daily at 6 (six) AM.   DEXLANSOPRAZOLE (DEXILANT) 60 MG CAPSULE    TAKE 1 CAPSULE BY MOUTH DAILY BEFORE BREAKFAST.    DICYCLOMINE (BENTYL) 10 MG CAPSULE    Take 1 capsule (10 mg total) by mouth every 12 (twelve) hours as needed for spasms (abdominal pain/discomfort).   DOCUSATE SODIUM (DSS) 100 MG CAPS    Take 100 mg by mouth daily as needed.   DULOXETINE (CYMBALTA) 60 MG CAPSULE    Take 60 mg by mouth every morning.    ERENUMAB-AOOE (AIMOVIG [place])    Inject into the skin every 30 (thirty) days.   EVOLOCUMAB (REPATHA [place])    Inject into the skin. Every two weeks.   FAMOTIDINE (PEPCID) 40 MG TABLET    Take one po 30-45 minutes  prior to supper.   FLUTICASONE (FLONASE) 50 MCG/ACT NASAL SPRAY    Place 2 sprays into both nostrils daily.   GABAPENTIN (NEURONTIN) 100 MG CAPSULE    Take 2 capsules by mouth 2 (two) times daily.   HYDROCORTISONE (ANUSOL-HC) 2.5 % RECTAL CREAM    APPLY 1 APPLICATION RECTALLY TWICE DAILY.   INSULIN GLARGINE-LIXISENATIDE (SOLIQUA [place])    Inject 15 Units into the skin in the morning.   LINACLOTIDE (LINZESS) 72 MCG CAPSULE    Take 1 capsule (72 mcg total) by mouth daily before breakfast.   LISINOPRIL (ZESTRIL) 2.5 MG TABLET    Take 2.5 mg by mouth daily.   LOPERAMIDE (IMODIUM) 2 MG CAPSULE    Take 1 capsule (2 mg total) by mouth 2 (two) times daily as needed for diarrhea or loose stools.   MILNACIPRAN HCL (SAVELLA) 100 MG TABS TABLET    Take 100 mg by mouth 2 (two) times daily.   MULTIPLE VITAMIN (MULTIVITAMIN WITH MINERALS) TABS TABLET    Take 1 tablet by mouth daily.   POLYETHYLENE GLYCOL POWDER (GLYCOLAX/MIRALAX) 17 GM/SCOOP POWDER    Take 0.5 Containers by mouth daily as needed for mild constipation.   POLYVINYL ALCOHOL (LIQUIFILM TEARS) 1.4 % OPHTHALMIC SOLUTION    Place 1 drop into both eyes 3 (three) times daily as needed for dry eyes.   PROMETHAZINE (PHENERGAN) 12.5 MG TABLET    Take 12.5 mg by mouth every 8 (eight) hours as needed.   RIZATRIPTAN (MAXALT-MLT) 10 MG DISINTEGRATING TABLET    Take 10 mg by mouth as needed for migraine. May repeat in 2 hours if needed   SILODOSIN (RAPAFLO) 8  MG CAPS CAPSULE    Take 8 mg by mouth at bedtime.    TIZANIDINE (ZANAFLEX) 2 MG TABLET    Take 2 mg by mouth 3 (three) times daily as needed.   TOPIRAMATE (TOPAMAX) 100 MG TABLET    Take 1 tablet (100 mg total) by mouth 2 (two) times daily.   TRAMADOL (ULTRAM) 50 MG TABLET    Take 2 tablets (100 mg total) by mouth 3 (three) times daily.   TRAVOPROST, BAK FREE, (TRAVATAN) 0.004 % SOLN OPHTHALMIC SOLUTION    Place 1 drop into both eyes at bedtime.   Modified Medications   No medications on file  Discontinued Medications   No medications on file   ----------------------------------------------------------------------------------------------------------------------  Follow-up: Return in about 3 weeks (around 02/24/2023) for TPI.  Trigger point injection: The area overlying the aforementioned trigger points were prepped with alcohol. They were then injected with a 25-gauge needle with 4 cc of ropivacaine 0.2% and Decadron 4 mg at each site after negative aspiration for heme. This was performed after informed consent was obtained and risks and benefits reviewed. She tolerated this procedure without difficulty and was convalesced and discharged to home in stable condition for follow-up as mentioned.  @[name]  Pernell [email or web address], [email or web address]  left get a [employer] operational officer because the  Yevette Edwards, MD

## 2023-02-05 NOTE — Progress Notes (Signed)
Name: [name] DOB: 02-13-1941 MRN: [id number]  History of Present Illness: [name] is a 82 y.o. female who presents today for follow up visit at [facility]. She is accompanied by her niece [name], who helps take care of her. - GU history: 1. Pelvic floor dyssynergia. - Previously did pelvic floor physical therapy.  2. Dysfunctional voiding with urinary hesitancy, straining, and sensation of incomplete emptying.  - Urodynamic testing on 01/03/2022 demonstrated a max capacity of 633 mL with normal sensation.  No evidence of stress incontinence or instability.  She was able to generate a voluntary contraction and void 316 mL with a max flow rate of 16 mL/s.  EMG leads required during voiding.  PVR was 316 mL. 3. Stage 2 cystocele.    At last visit with Dr. Annabell Howells on 11/20/2022: - PVR = 260 ml. - Taking Rapaflo. - "She is on duloxetine which can be associated with voiding difficulty.  I will have her speak to her PCP about finding and alternative and will have her return in 3 months.   She is not interested in CIC so I will have her try PTNS."  Since last visit: Did not return for PTNS.  Today: She reports no improvement with Rapaflo daily; continues to report bothersome urinary hesitancy, straining to void, or sensations of incomplete emptying. Denies urgency, frequency, nocturia, dysuria, gross hematuria, flank pain, or abdominal pain. She reports that she takes a muscle relaxer (half of a Zanaflex 2 mg) nightly; cannot tolerate full 2 mg dose because it makes her very drowsy the next morning. States pelvic floor physical therapy was not helpful in the past and that she has transportation issues which would make PTNS a hardship for her.    Fall Screening: Do you usually have a device to assist in your mobility? Yes - cane    Medications: Current Outpatient Medications  Medication Sig Dispense Refill   AMBULATORY NON FORMULARY MEDICATION Valium (Diazepam)  10 mg vaginal suppository. Dispense #30; 2 refills. Insert 1 suppository into vagina nightly for pelvic floor muscle relaxation. 30 each 2   acetaminophen (TYLENOL) 500 MG tablet Take 500 mg by mouth every 6 (six) hours as needed for mild pain.      ALPRAZolam (XANAX XR) 1 MG 24 hr tablet Take 0.5-1 mg by mouth as needed for sleep (Anxiety).      ascorbic acid (VITAMIN C) 500 MG tablet Take 1,000 mg by mouth daily.     cetirizine (ZYRTEC) 10 MG tablet Take 10 mg by mouth daily.     cholecalciferol (VITAMIN D3) 10 MCG (400 UNIT) TABS tablet Take 5,000 Units by mouth daily.     cyanocobalamin (VITAMIN B12) 1000 MCG tablet Take 1,000 mcg by mouth daily.     Dapagliflozin Propanediol (FARXIGA PO) Take 10 mg by mouth daily at 6 (six) AM.     dexlansoprazole (DEXILANT) 60 MG capsule TAKE 1 CAPSULE BY MOUTH DAILY BEFORE BREAKFAST. 90 capsule 0   dicyclomine (BENTYL) 10 MG capsule Take 1 capsule (10 mg total) by mouth every 12 (twelve) hours as needed for spasms (abdominal pain/discomfort). 60 capsule 2   Docusate Sodium (DSS) 100 MG CAPS Take 100 mg by mouth daily as needed.     DULoxetine (CYMBALTA) 60 MG capsule Take 60 mg by mouth every morning.      Erenumab-aooe (AIMOVIG [place]) Inject into the skin every 30 (thirty) days.     Evolocumab (REPATHA [place]) Inject into the skin. Every two  weeks.     famotidine (PEPCID) 40 MG tablet Take one po 30-45 minutes prior to supper. 90 tablet 3   fluticasone (FLONASE) 50 MCG/ACT nasal spray Place 2 sprays into both nostrils daily.     gabapentin (NEURONTIN) 100 MG capsule Take 2 capsules by mouth 2 (two) times daily.     hydrocortisone (ANUSOL-HC) 2.5 % rectal cream APPLY 1 APPLICATION RECTALLY TWICE DAILY. 30 g 1   Insulin Glargine-Lixisenatide (SOLIQUA [place]) Inject 15 Units into the skin in the morning.     linaclotide (LINZESS) 72 MCG capsule Take 1 capsule (72 mcg total) by mouth daily before breakfast. 90 capsule 3   lisinopril (ZESTRIL) 2.5 MG tablet Take 2.5 mg  by mouth daily.     loperamide (IMODIUM) 2 MG capsule Take 1 capsule (2 mg total) by mouth 2 (two) times daily as needed for diarrhea or loose stools.     Milnacipran HCl (SAVELLA) 100 MG TABS tablet Take 100 mg by mouth 2 (two) times daily.     Multiple Vitamin (MULTIVITAMIN WITH MINERALS) TABS tablet Take 1 tablet by mouth daily.     polyethylene glycol powder (GLYCOLAX/MIRALAX) 17 GM/SCOOP powder Take 0.5 Containers by mouth daily as needed for mild constipation.     polyvinyl alcohol (LIQUIFILM TEARS) 1.4 % ophthalmic solution Place 1 drop into both eyes 3 (three) times daily as needed for dry eyes.     promethazine (PHENERGAN) 12.5 MG tablet Take 12.5 mg by mouth every 8 (eight) hours as needed.     rizatriptan (MAXALT-MLT) 10 MG disintegrating tablet Take 10 mg by mouth as needed for migraine. May repeat in 2 hours if needed     silodosin (RAPAFLO) 8 MG CAPS capsule Take 8 mg by mouth at bedtime.      tiZANidine (ZANAFLEX) 2 MG tablet Take 2 mg by mouth 3 (three) times daily as needed.     topiramate (TOPAMAX) 100 MG tablet Take 1 tablet (100 mg total) by mouth 2 (two) times daily. 60 tablet 5   traMADol (ULTRAM) 50 MG tablet Take 2 tablets (100 mg total) by mouth 3 (three) times daily. 180 tablet 1   Travoprost, BAK Free, (TRAVATAN) 0.004 % SOLN ophthalmic solution Place 1 drop into both eyes at bedtime.      No current facility-administered medications for this visit.    Allergies: Allergies  Allergen Reactions   Botox [Onabotulinumtoxina]     Patient states that it became loose in her stomach , and it caused multiple problems.   Morphine Anaphylaxis and Other (See Comments)    "it will kill me" "made me stop breathing"   Aspirin Other (See Comments)    Gi bleed    Cefuroxime Axetil Nausea And Vomiting   Codeine Nausea And Vomiting   Diltiazem Nausea And Vomiting   Dronabinol Nausea And Vomiting and Palpitations   Shellfish Allergy Other (See Comments)    Blood sugar drops    Other     Mold and trees   Ondansetron Hives   Penicillins Rash    Did it involve swelling of the face/tongue/throat, SOB, or low BP? No Did it involve sudden or severe rash/hives, skin peeling, or any reaction on the inside of your mouth or nose? Yes Did you need to seek medical attention at a hospital or doctor's office? Yes When did it last happen?  Over 10 years  If all above answers are "NO", may proceed with cephalosporin use.     Past Medical History:  Diagnosis Date   Anemia    Anxiety    Arthritis    "all over" (04/19/2013)   Asthma    Bleeding stomach ulcer 02/08/1979   Bloating    Chronic back pain    Chronic headaches    Chronic heartburn    Chronic neck pain    Chronic pain    Colon cancer (HCC)    DDD (degenerative disc disease), lumbar    Diabetic peripheral neuropathy (HCC)    "in my feet" (04/19/2013)   Dizziness    Dysphagia    Family history of anesthesia complication    "daughter has PONV too" (04/19/2013)   Fibromyalgia    Gastroesophageal reflux    Gastroparesis    Glaucoma, bilateral    Hiatal hernia    "had it before; had OR; got it again" (04/19/2013)   History of blood transfusion 02/08/1979   "w/bleeding stomach ulcer" (04/19/2013)   History of kidney stones    Migraine    "take RX for it qd" (04/19/2013)   Nausea    Pneumonia 04/09/2012   PONV (postoperative nausea and vomiting)    Renal insufficiency    TIA (transient ischemic attack)    "3-4 before starting RX; none since" (04/19/2013)   Type II diabetes mellitus (HCC)    Walking pneumonia ~ 1966   Past Surgical History:  Procedure Laterality Date   ABDOMINAL ADHESION SURGERY  ~ 2012   "repaired wrap where they did hiatal hernia OR too" 911/04/2013)   ANTERIOR CERVICAL DISCECTOMY  ~ 2009   "only cleaned out arthritis and spurs" (04/19/2013)   BIOPSY  01/09/2021   Procedure: BIOPSY;  Surgeon: Malissa Hippo, MD;  Location: [facility];  Service: Endoscopy;;  antrum; distal  esophagus;   CATARACT EXTRACTION W/ INTRAOCULAR LENS IMPLANT Left 04/13/2013   CHOLECYSTECTOMY  1980's   COLON SURGERY     COLONOSCOPY  10/11/2010   COLONOSCOPY  11/23/2009   COLONOSCOPY  07/28/2008   W/SNARE   COLONOSCOPY  06/28/07   COLONOSCOPY  05/10/07   W/POLYP   COLONOSCOPY  12/28/00   COLONOSCOPY N/A 06/29/2015   Procedure: COLONOSCOPY;  Surgeon: Malissa Hippo, MD;  Location: [facility];  Service: Endoscopy;  Laterality: N/A;  135   COLONOSCOPY WITH ESOPHAGOGASTRODUODENOSCOPY (EGD) N/A 05/13/2013   Procedure: COLONOSCOPY WITH ESOPHAGOGASTRODUODENOSCOPY (EGD);  Surgeon: Malissa Hippo, MD;  Location: [facility];  Service: Endoscopy;  Laterality: N/A;  855   COLONOSCOPY WITH PROPOFOL N/A 01/09/2021   Procedure: COLONOSCOPY WITH PROPOFOL;  Surgeon: Malissa Hippo, MD;  Location: [facility];  Service: Endoscopy;  Laterality: N/A;  12:50   ESOPHAGOGASTRODUODENOSCOPY (EGD) WITH PROPOFOL N/A 10/06/2018   Procedure: ESOPHAGOGASTRODUODENOSCOPY (EGD) WITH PROPOFOL;  Surgeon: Malissa Hippo, MD;  Location: [facility];  Service: Endoscopy;  Laterality: N/A;   ESOPHAGOGASTRODUODENOSCOPY (EGD) WITH PROPOFOL N/A 01/09/2021   Procedure: ESOPHAGOGASTRODUODENOSCOPY (EGD) WITH PROPOFOL;  Surgeon: Malissa Hippo, MD;  Location: [facility];  Service: Endoscopy;  Laterality: N/A;   HEMICOLECTOMY  2010   ZIEGLER   HIATAL HERNIA REPAIR  1970's   LEFT HEART CATHETERIZATION WITH CORONARY ANGIOGRAM N/A 04/19/2013   Procedure: LEFT HEART CATHETERIZATION WITH CORONARY ANGIOGRAM;  Surgeon: Peter M [place], MD;  Location: [facility] C[facility];  Service: Cardiovascular;  Laterality: N/A;   POLYPECTOMY  10/06/2018   Procedure: POLYPECTOMY;  Surgeon: Malissa Hippo, MD;  [facility]: [facility];  Service: Endoscopy;;  gastric   POLYPECTOMY  01/09/2021   Procedure:  POLYPECTOMY;  Surgeon: Malissa Hippo, MD;  Location: [facility];  Service: Endoscopy;;  transverse;splenic flexure   POSTERIOR FUSION  CERVICAL SPINE  1985   "had a broken neck" (04/19/2013)   TONSILLECTOMY  ~ 1953   TOOTH EXTRACTION Bilateral 12/04/2020   Procedure: BILATERAL TEMPOROMANDIBULAR JOINT ARTHROCENTESIS; DENTAL EXTRACTION TEETH #3,6,7,8,9,10,11,12,19,23,24,25,26,32 WITH ALVEOLOPLASTY;  Surgeon: Ocie Doyne, DMD;  Location: [facility];  Service: Oral Surgery;  Laterality: Bilateral;   UPPER GASTROINTESTINAL ENDOSCOPY  10/11/2010   EGD ED   UPPER GASTROINTESTINAL ENDOSCOPY  11/23/2009   UPPER GASTROINTESTINAL ENDOSCOPY  05/10/07   EGD ED   UPPER GASTROINTESTINAL ENDOSCOPY  08/11/01   EGD ED   VAGINAL HYSTERECTOMY     Family History  Problem Relation Age of Onset   Heart disease Mother    Diabetes Mother    Dementia Father    Healthy Sister    Diabetes Brother    Kidney cancer Brother    Diabetes Brother    Neuropathy Brother    Diabetes Daughter    Diabetes Daughter    Diabetes Son    Social History   Socioeconomic History   Marital status: Widowed    Spouse name: Not on file   Number of children: Not on file   Years of education: Not on file   Highest education level: Not on file  Occupational History    Employer: RETIRED  Tobacco Use   Smoking status: Former    Current packs/day: 0.00    Average packs/day: 1.5 packs/day for 15.0 years (22.5 ttl pk-yrs)    Types: Cigarettes    Start date: 03/16/1976    Quit date: 03/17/1991    Years since quitting: 31.9    Passive exposure: Past   Smokeless tobacco: Never   Tobacco comments:    Patient states that it has ben greater than 20 years since she quit smoking  Vaping Use   Vaping status: Never Used  Substance and Sexual Activity   Alcohol use: No    Alcohol/week: 0.0 standard drinks of alcohol   Drug use: No   Sexual activity: Not Currently  Other Topics Concern   Not on file  Social History Narrative   Patient lives at home alone.    Patient is retired.    Patient is widowed.    Patient has 4 children.   Patient has a 11th grade  education.          Social Determinants of Health   Financial Resource Strain: Not on file  Food Insecurity: Not on file  Transportation Needs: Not on file  Physical Activity: Not on file  Stress: Not on file  Social Connections: Not on file  Intimate Partner Violence: Not on file    Review of Systems Constitutional: Patient denies any unintentional weight loss or change in strength lntegumentary: Patient denies any rashes or pruritus Cardiovascular: Patient denies chest pain or syncope Respiratory: Patient denies shortness of breath Gastrointestinal: Patient denies nausea, vomiting, constipation, or diarrhea Musculoskeletal: Patient denies muscle cramps or weakness Neurologic: Patient denies convulsions or seizures Psychiatric: Patient denies memory problems Allergic/Immunologic: Patient denies recent allergic reaction(s) Hematologic/Lymphatic: Patient denies bleeding tendencies Endocrine: Patient denies heat/cold intolerance  GU: As per HPI.  OBJECTIVE Vitals:   02/10/23 0856  BP: (!) 150/74  Pulse: 62  Temp: 98.3 F (36.8 C)   There is no height or weight on file to calculate BMI.  Physical Examination Constitutional: No obvious distress; patient is non-toxic appearing  Cardiovascular: No visible  lower extremity edema.  Respiratory: The patient does not have audible wheezing/stridor; respirations do not appear labored  Gastrointestinal: Abdomen non-distended Musculoskeletal: Normal ROM of UEs  Skin: No obvious rashes/open sores  Neurologic: CN 2-12 grossly intact Psychiatric: Answered questions appropriately with normal affect  Hematologic/Lymphatic/Immunologic: No obvious bruises or sites of spontaneous bleeding  UA: no evidence of UTI or microscopic hematuria PVR: 144 ml  ASSESSMENT Pelvic floor dysfunction - Plan: BLADDER SCAN AMB NON-IMAGING, Urinalysis, Routine w reflex microscopic, AMBULATORY NON FORMULARY MEDICATION  Voiding dysfunction - Plan:  AMBULATORY NON FORMULARY MEDICATION  Straining to void - Plan: AMBULATORY NON FORMULARY MEDICATION  We reviewed history and current status in detail. PVR improved compared to prior. We reviewed the diagnosis of high-tone pelvic floor dysfunction and how it can contribute to voiding dysfunction. We discussed management options including relaxation techniques, yoga, stretching, oral muscle relaxers, Valium suppositories, and/or pelvic floor physical therapy. Patient elected to try vaginal Valium 10 mg suppositories in addition to continuing Zanaflex 1 mg nightly and Rapaflo 8 mg daily.   Will plan for follow up in 6 weeks or sooner if needed. Pt verbalized understanding and agreement. All questions were answered.  PLAN Advised the following: 1. Start compounded vaginal Valium 10 mg suppository nightly.  2. Continue oral Zanaflex 1 mg nightly. 3. Continue oral Rapaflo 8 mg daily.  4. Return in about 6 weeks (around 03/24/2023) for f/u with Dr. Annabell Howells.  Orders Placed This Encounter  Procedures   Urinalysis, Routine w reflex microscopic   BLADDER SCAN AMB NON-IMAGING    It has been explained that the patient is to follow regularly with their PCP in addition to all other providers involved in their care and to follow instructions provided by these respective offices. Patient advised to contact urology clinic if any urologic-pertaining questions, concerns, new symptoms or problems arise in the interim period.  There are no Patient Instructions on file for this visit.  Electronically signed by:  Donnita Falls, FNP   02/10/23    11:56 AM

## 2023-02-10 ENCOUNTER — Encounter: Payer: Self-pay | Admitting: Urology

## 2023-02-10 ENCOUNTER — Ambulatory Visit (INDEPENDENT_AMBULATORY_CARE_PROVIDER_SITE_OTHER): Payer: Medicare Other | Admitting: Urology

## 2023-02-10 VITALS — BP 150/74 | HR 62 | Temp 98.3°F

## 2023-02-10 DIAGNOSIS — N398 Other specified disorders of urinary system: Secondary | ICD-10-CM | POA: Diagnosis not present

## 2023-02-10 DIAGNOSIS — M6289 Other specified disorders of muscle: Secondary | ICD-10-CM

## 2023-02-10 DIAGNOSIS — R3916 Straining to void: Secondary | ICD-10-CM | POA: Diagnosis not present

## 2023-02-10 LAB — URINALYSIS, ROUTINE W REFLEX MICROSCOPIC
Bilirubin, UA: NEGATIVE
Ketones, UA: NEGATIVE
Leukocytes,UA: NEGATIVE
Nitrite, UA: NEGATIVE
Protein,UA: NEGATIVE
RBC, UA: NEGATIVE
Specific Gravity, UA: <1.005 — ABNORMAL LOW (ref 1.005–1.030)
Urobilinogen, Ur: 0.2 mg/dL (ref 0.2–1.0)
pH, UA: 6 (ref 5.0–7.5)

## 2023-02-10 MED ORDER — AMBULATORY NON FORMULARY MEDICATION
2 refills | Status: DC
Start: 2023-02-10 — End: 2023-08-27

## 2023-02-10 NOTE — Progress Notes (Signed)
post void residual =144

## 2023-03-03 ENCOUNTER — Ambulatory Visit: Payer: Medicare Other | Attending: Anesthesiology | Admitting: Anesthesiology

## 2023-03-03 ENCOUNTER — Emergency Department
Admission: EM | Admit: 2023-03-03 | Discharge: 2023-03-03 | Disposition: A | Discharge status: Home / Self Care | Payer: Medicare Other | Attending: Emergency Medicine | Admitting: Emergency Medicine

## 2023-03-03 ENCOUNTER — Emergency Department: Payer: Medicare Other

## 2023-03-03 ENCOUNTER — Encounter: Payer: Self-pay | Admitting: Anesthesiology

## 2023-03-03 ENCOUNTER — Other Ambulatory Visit: Payer: Self-pay

## 2023-03-03 VITALS — BP 157/84 | HR 78 | Temp 97.2°F | Resp 18 | Ht 59.0 in | Wt 130.0 lb

## 2023-03-03 DIAGNOSIS — M7918 Myalgia, other site: Secondary | ICD-10-CM | POA: Diagnosis not present

## 2023-03-03 DIAGNOSIS — G894 Chronic pain syndrome: Secondary | ICD-10-CM | POA: Diagnosis present

## 2023-03-03 DIAGNOSIS — R519 Headache, unspecified: Secondary | ICD-10-CM | POA: Diagnosis not present

## 2023-03-03 DIAGNOSIS — M797 Fibromyalgia: Secondary | ICD-10-CM | POA: Diagnosis present

## 2023-03-03 DIAGNOSIS — M542 Cervicalgia: Secondary | ICD-10-CM | POA: Diagnosis not present

## 2023-03-03 DIAGNOSIS — Y92410 Unspecified street and highway as the place of occurrence of the external cause: Secondary | ICD-10-CM | POA: Insufficient documentation

## 2023-03-03 DIAGNOSIS — Q762 Congenital spondylolisthesis: Secondary | ICD-10-CM | POA: Insufficient documentation

## 2023-03-03 DIAGNOSIS — M961 Postlaminectomy syndrome, not elsewhere classified: Secondary | ICD-10-CM | POA: Diagnosis present

## 2023-03-03 MED ORDER — ROPIVACAINE HCL 2 MG/ML IJ SOLN
INTRAMUSCULAR | Status: AC
  Filled 2023-03-03: qty 20

## 2023-03-03 MED ORDER — DEXAMETHASONE SODIUM PHOSPHATE 10 MG/ML IJ SOLN
INTRAMUSCULAR | Status: AC
  Filled 2023-03-03: qty 1

## 2023-03-03 MED ORDER — ROPIVACAINE HCL 2 MG/ML IJ SOLN: 10.0000 mL | Freq: Once | INTRAMUSCULAR | Status: DC

## 2023-03-03 MED ORDER — DEXAMETHASONE SODIUM PHOSPHATE 10 MG/ML IJ SOLN
10.0000 mg | Freq: Once | INTRAMUSCULAR | Status: AC
  Administered 2023-03-03: 10 mg

## 2023-03-03 MED ORDER — ACETAMINOPHEN 325 MG PO TABS
650.0000 mg | ORAL_TABLET | Freq: Once | ORAL | Status: AC
  Administered 2023-03-03: 650 mg via ORAL
  Filled 2023-03-03: qty 2
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
## 2023-03-03 NOTE — Progress Notes (Signed)
Subjective:  Patient ID: [name], female    DOB: 1941/06/01  Age: 82 y.o. MRN: [id number]  CC: Headache   Procedure: Bilateral trapezius muscle trigger points  HPI [name] presents for reevaluation.  She recently had trigger points for her persistent cervical neck pain.  She feels that these were quite effective.  She got 3 weeks of nearly 80 to 90% relief.  She still has centralized neck pain which is chronic and has been present for 50 or so years she reports.  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  As a result she has been feeling better and has less severe pain during the day and sleeps better at night.  Otherwise she is in her usual state of health.  She presents today requesting repeat cervical trigger point.  Outpatient Medications Prior to Visit  Medication Sig Dispense Refill   acetaminophen (TYLENOL) 500 MG tablet Take 500 mg by mouth every 6 (six) hours as needed for mild pain.      ALPRAZolam (XANAX XR) 1 MG 24 hr tablet Take 0.5-1 mg by mouth as needed for sleep (Anxiety).      AMBULATORY NON FORMULARY MEDICATION Valium (Diazepam) 10 mg vaginal suppository. Dispense #30; 2 refills. Insert 1 suppository into vagina nightly for pelvic floor muscle relaxation. 30 each 2   ascorbic acid (VITAMIN C) 500 MG tablet Take 1,000 mg by mouth daily.     cetirizine (ZYRTEC) 10 MG tablet Take 10 mg by mouth daily.     cholecalciferol (VITAMIN D3) 10 MCG (400 UNIT) TABS tablet Take 5,000 Units by mouth daily.     cyanocobalamin (VITAMIN B12) 1000 MCG tablet Take 1,000 mcg by mouth daily.     Dapagliflozin Propanediol (FARXIGA PO) Take 10 mg by mouth daily at 6 (six) AM.     dexlansoprazole (DEXILANT) 60 MG capsule TAKE 1 CAPSULE BY MOUTH DAILY BEFORE BREAKFAST. 90 capsule 0   dicyclomine (BENTYL) 10 MG capsule Take 1  capsule (10 mg total) by mouth every 12 (twelve) hours as needed for spasms (abdominal pain/discomfort). 60 capsule 2   Docusate Sodium (DSS) 100 MG CAPS Take 100 mg by mouth daily as needed.     DULoxetine (CYMBALTA) 60 MG capsule Take 60 mg by mouth every morning.      Erenumab-aooe (AIMOVIG [place]) Inject into the skin every 30 (thirty) days.     Evolocumab (REPATHA [place]) Inject into the skin. Every two weeks.     famotidine (PEPCID) 40 MG tablet Take one po 30-45 minutes prior to supper. 90 tablet 3   fluticasone (FLONASE) 50 MCG/ACT nasal spray Place 2 sprays into both nostrils daily.     gabapentin (NEURONTIN) 100 MG capsule Take 2 capsules by mouth 2 (two) times daily.     hydrocortisone (ANUSOL-HC) 2.5 % rectal cream APPLY 1 APPLICATION RECTALLY TWICE DAILY. 30 g 1   Insulin Glargine-Lixisenatide (SOLIQUA [place]) Inject 15 Units into the skin in the morning.     linaclotide (LINZESS) 72 MCG capsule Take 1 capsule (72 mcg total) by mouth daily before breakfast. 90 capsule 3   lisinopril (ZESTRIL) 2.5 MG tablet Take 2.5 mg by mouth daily.     loperamide (IMODIUM) 2 MG capsule Take 1 capsule (2 mg total) by mouth 2 (two) times daily as needed for diarrhea or loose stools.  Milnacipran HCl (SAVELLA) 100 MG TABS tablet Take 100 mg by mouth 2 (two) times daily.     Multiple Vitamin (MULTIVITAMIN WITH MINERALS) TABS tablet Take 1 tablet by mouth daily.     polyethylene glycol powder (GLYCOLAX/MIRALAX) 17 GM/SCOOP powder Take 0.5 Containers by mouth daily as needed for mild constipation.     polyvinyl alcohol (LIQUIFILM TEARS) 1.4 % ophthalmic solution Place 1 drop into both eyes 3 (three) times daily as needed for dry eyes.     promethazine (PHENERGAN) 12.5 MG tablet Take 12.5 mg by mouth every 8 (eight) hours as needed.     rizatriptan (MAXALT-MLT) 10 MG disintegrating tablet Take 10 mg by mouth as needed for migraine. May repeat in 2 hours if needed     silodosin (RAPAFLO) 8 MG CAPS capsule Take 8 mg  by mouth at bedtime.      tiZANidine (ZANAFLEX) 2 MG tablet Take 2 mg by mouth 3 (three) times daily as needed.     topiramate (TOPAMAX) 100 MG tablet Take 1 tablet (100 mg total) by mouth 2 (two) times daily. 60 tablet 5   traMADol (ULTRAM) 50 MG tablet Take 2 tablets (100 mg total) by mouth 3 (three) times daily. 180 tablet 1   Travoprost, BAK Free, (TRAVATAN) 0.004 % SOLN ophthalmic solution Place 1 drop into both eyes at bedtime.      No facility-administered medications prior to visit.    Review of Systems CNS: No confusion or sedation Cardiac: No angina or palpitations GI: No abdominal pain or constipation Constitutional: No nausea vomiting fevers or chills  Objective:  BP (!) 157/84   Pulse 78   Temp (!) 97.2 F (36.2 C) (Temporal)   Resp 18   Ht 4\' 11"  (1.499 m)   Wt 130 lb (59 kg)   SpO2 95%   BMI 26.26 kg/m    BP Readings from Last 3 Encounters:  03/03/23 (!) 157/84  02/10/23 (!) 150/74  02/03/23 (!) 145/85     Wt Readings from Last 3 Encounters:  03/03/23 130 lb (59 kg)  02/03/23 125 lb (56.7 kg)  01/06/23 130 lb (59 kg)     Physical Exam Pt is alert and oriented PERRL EOMI HEART IS RRR no murmur or rub LCTA no wheezing or rales MUSCULOSKELETAL 2 trigger points in the trapezius musculature.  This is comparable to what she has had in the past but the area appears less tender.  Muscle tone and bulk in this region is at baseline.  Labs  No results found for: "HGBA1C" Lab Results  Component Value Date   CREATININE 0.80 12/04/2020    -------------------------------------------------------------------------------------------------------------------- Lab Results  Component Value Date   WBC 9.0 10/10/2021   HGB 12.6 10/10/2021   HCT 38.7 10/10/2021   PLT 207 10/10/2021   GLUCOSE 172 (H) 12/04/2020   ALT 13 (L) 07/04/2016   AST 12 (L) 07/04/2016   NA 140 12/04/2020   K 3.4 (L) 12/04/2020   CL 102 12/04/2020   CREATININE 0.80 12/04/2020   BUN 7  (L) 12/04/2020   CO2 23 12/04/2020   TSH 1.31 12/25/2015   INR 1.03 04/19/2013    --------------------------------------------------------------------------------------------------------------------- No results found.   Assessment & Plan:   Emanii was seen today for headache.  Diagnoses and all orders for this visit:  Cervical post-laminectomy syndrome  Fibromyalgia  SPONDYLOLITHESIS  Cervicalgia -     dexamethasone (DECADRON) injection 10 mg -     ropivacaine (PF) 2 mg/mL (0.2%) (NAROPIN) injection 10  mL  Cervical myofascial pain syndrome -     dexamethasone (DECADRON) injection 10 mg -     ropivacaine (PF) 2 mg/mL (0.2%) (NAROPIN) injection 10 mL  Chronic pain syndrome        ----------------------------------------------------------------------------------------------------------------------  Problem List Items Addressed This Visit       Unprioritized   Cervical post-laminectomy syndrome - Primary   Chronic pain syndrome   Relevant Medications   ropivacaine (PF) 2 mg/mL (0.2%) (NAROPIN) injection 10 mL   Fibromyalgia   Relevant Medications   ropivacaine (PF) 2 mg/mL (0.2%) (NAROPIN) injection 10 mL   SPONDYLOLITHESIS   Other Visit Diagnoses     Cervicalgia       Relevant Medications   dexamethasone (DECADRON) injection 10 mg (Completed)   ropivacaine (PF) 2 mg/mL (0.2%) (NAROPIN) injection 10 mL   Cervical myofascial pain syndrome       Relevant Medications   dexamethasone (DECADRON) injection 10 mg (Completed)   ropivacaine (PF) 2 mg/mL (0.2%) (NAROPIN) injection 10 mL         ----------------------------------------------------------------------------------------------------------------------  1. Cervical post-laminectomy syndrome Continue with current medication management and physical therapy modality  2. Fibromyalgia As above  3. SPONDYLOLITHESIS   4. Cervicalgia Proceed with trigger point injection today after risk-benefit  reviewed.  All questions are answered. - dexamethasone (DECADRON) injection 10 mg - ropivacaine (PF) 2 mg/mL (0.2%) (NAROPIN) injection 10 mL  5. Cervical myofascial pain syndrome As above and continue stretching strengthening exercises reviewed with her today. - dexamethasone (DECADRON) injection 10 mg - ropivacaine (PF) 2 mg/mL (0.2%) (NAROPIN) injection 10 mL  6. Chronic pain syndrome As above and continue tramadol    ----------------------------------------------------------------------------------------------------------------------  I am having Carney Bern A. Sabater maintain her ALPRAZolam, acetaminophen, DULoxetine, silodosin, topiramate, Travoprost (BAK Free), multivitamin with minerals, polyvinyl alcohol, lisinopril, hydrocortisone, loperamide, Dapagliflozin Propanediol (FARXIGA PO), dicyclomine, famotidine, Erenumab-aooe (AIMOVIG [place]), Evolocumab (REPATHA [place]), linaclotide, Insulin Glargine-Lixisenatide (SOLIQUA [place]), dexlansoprazole, gabapentin, rizatriptan, cyanocobalamin, cholecalciferol, Milnacipran HCl, DSS, ascorbic acid, cetirizine, fluticasone, polyethylene glycol powder, promethazine, tiZANidine, traMADol, and AMBULATORY NON FORMULARY MEDICATION. We administered dexamethasone.   Meds ordered this encounter  Medications   dexamethasone (DECADRON) injection 10 mg   ropivacaine (PF) 2 mg/mL (0.2%) (NAROPIN) injection 10 mL   Patient's Medications  New Prescriptions   No medications on file  Previous Medications   ACETAMINOPHEN (TYLENOL) 500 MG TABLET    Take 500 mg by mouth every 6 (six) hours as needed for mild pain.    ALPRAZOLAM (XANAX XR) 1 MG 24 HR TABLET    Take 0.5-1 mg by mouth as needed for sleep (Anxiety).    AMBULATORY NON FORMULARY MEDICATION    Valium (Diazepam) 10 mg vaginal suppository. Dispense #30; 2 refills. Insert 1 suppository into vagina nightly for pelvic floor muscle relaxation.   ASCORBIC ACID (VITAMIN C) 500 MG TABLET    Take 1,000 mg by mouth daily.    CETIRIZINE (ZYRTEC) 10 MG TABLET    Take 10 mg by mouth daily.   CHOLECALCIFEROL (VITAMIN D3) 10 MCG (400 UNIT) TABS TABLET    Take 5,000 Units by mouth daily.   CYANOCOBALAMIN (VITAMIN B12) 1000 MCG TABLET    Take 1,000 mcg by mouth daily.   DAPAGLIFLOZIN PROPANEDIOL (FARXIGA PO)    Take 10 mg by mouth daily at 6 (six) AM.   DEXLANSOPRAZOLE (DEXILANT) 60 MG CAPSULE    TAKE 1 CAPSULE BY MOUTH DAILY BEFORE BREAKFAST.   DICYCLOMINE (BENTYL) 10 MG CAPSULE    Take 1 capsule (10 mg  total) by mouth every 12 (twelve) hours as needed for spasms (abdominal pain/discomfort).   DOCUSATE SODIUM (DSS) 100 MG CAPS    Take 100 mg by mouth daily as needed.   DULOXETINE (CYMBALTA) 60 MG CAPSULE    Take 60 mg by mouth every morning.    ERENUMAB-AOOE (AIMOVIG [place])    Inject into the skin every 30 (thirty) days.   EVOLOCUMAB (REPATHA [place])    Inject into the skin. Every two weeks.   FAMOTIDINE (PEPCID) 40 MG TABLET    Take one po 30-45 minutes prior to supper.   FLUTICASONE (FLONASE) 50 MCG/ACT NASAL SPRAY    Place 2 sprays into both nostrils daily.   GABAPENTIN (NEURONTIN) 100 MG CAPSULE    Take 2 capsules by mouth 2 (two) times daily.   HYDROCORTISONE (ANUSOL-HC) 2.5 % RECTAL CREAM    APPLY 1 APPLICATION RECTALLY TWICE DAILY.   INSULIN GLARGINE-LIXISENATIDE (SOLIQUA )    Inject 15 Units into the skin in the morning.   LINACLOTIDE (LINZESS) 72 MCG CAPSULE    Take 1 capsule (72 mcg total) by mouth daily before breakfast.   LISINOPRIL (ZESTRIL) 2.5 MG TABLET    Take 2.5 mg by mouth daily.   LOPERAMIDE (IMODIUM) 2 MG CAPSULE    Take 1 capsule (2 mg total) by mouth 2 (two) times daily as needed for diarrhea or loose stools.   MILNACIPRAN HCL (SAVELLA) 100 MG TABS TABLET    Take 100 mg by mouth 2 (two) times daily.   MULTIPLE VITAMIN (MULTIVITAMIN WITH MINERALS) TABS TABLET    Take 1 tablet by mouth daily.   POLYETHYLENE GLYCOL POWDER (GLYCOLAX/MIRALAX) 17 GM/SCOOP POWDER    Take 0.5 Containers by mouth daily as needed  for mild constipation.   POLYVINYL ALCOHOL (LIQUIFILM TEARS) 1.4 % OPHTHALMIC SOLUTION    Place 1 drop into both eyes 3 (three) times daily as needed for dry eyes.   PROMETHAZINE (PHENERGAN) 12.5 MG TABLET    Take 12.5 mg by mouth every 8 (eight) hours as needed.   RIZATRIPTAN (MAXALT-MLT) 10 MG DISINTEGRATING TABLET    Take 10 mg by mouth as needed for migraine. May repeat in 2 hours if needed   SILODOSIN (RAPAFLO) 8 MG CAPS CAPSULE    Take 8 mg by mouth at bedtime.    TIZANIDINE (ZANAFLEX) 2 MG TABLET    Take 2 mg by mouth 3 (three) times daily as needed.   TOPIRAMATE (TOPAMAX) 100 MG TABLET    Take 1 tablet (100 mg total) by mouth 2 (two) times daily.   TRAMADOL (ULTRAM) 50 MG TABLET    Take 2 tablets (100 mg total) by mouth 3 (three) times daily.   TRAVOPROST, BAK FREE, (TRAVATAN) 0.004 % SOLN OPHTHALMIC SOLUTION    Place 1 drop into both eyes at bedtime.   Modified Medications   No medications on file  Discontinued Medications   No medications on file   ----------------------------------------------------------------------------------------------------------------------  Follow-up: No follow-ups on file.  Trigger point injection: The area overlying the aforementioned trigger points were prepped with alcohol. They were then injected with a 25-gauge needle with 4 cc of ropivacaine 0.2% and Decadron 4 mg at each site after negative aspiration for heme. This was performed after informed consent was obtained and risks and benefits reviewed. She tolerated this procedure without difficulty and was convalesced and discharged to home in stable condition for follow-up as mentioned.  @Emiel Kielty  Pernell Dupre, MD@  Yevette Edwards, MD

## 2023-03-03 NOTE — Patient Instructions (Signed)
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
## 2023-03-04 ENCOUNTER — Telehealth: Payer: Self-pay | Admitting: *Deleted

## 2023-03-04 NOTE — Telephone Encounter (Signed)
Attempted to call for post procedure follow-up. Message left. 

## 2023-03-10 ENCOUNTER — Encounter: Payer: Self-pay | Admitting: Anesthesiology

## 2023-03-17 ENCOUNTER — Ambulatory Visit (INDEPENDENT_AMBULATORY_CARE_PROVIDER_SITE_OTHER): Payer: Medicare Other | Admitting: Gastroenterology

## 2023-03-18 ENCOUNTER — Other Ambulatory Visit: Payer: Self-pay | Admitting: Anesthesiology
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
## 2023-03-24 ENCOUNTER — Telehealth (INDEPENDENT_AMBULATORY_CARE_PROVIDER_SITE_OTHER): Payer: Self-pay | Admitting: *Deleted

## 2023-03-24 NOTE — Telephone Encounter (Signed)
Refill request from pharmacy for famotidine 40mg  take one tablet by mouth 30 -45 minutes prior to dinner.   Last seen 11/25/22

## 2023-03-25 ENCOUNTER — Other Ambulatory Visit (INDEPENDENT_AMBULATORY_CARE_PROVIDER_SITE_OTHER): Payer: Self-pay | Admitting: Gastroenterology

## 2023-03-25 DIAGNOSIS — K219 Gastro-esophageal reflux disease without esophagitis: Secondary | ICD-10-CM

## 2023-03-25 DIAGNOSIS — K220 Achalasia of cardia: Secondary | ICD-10-CM

## 2023-03-25 MED ORDER — FAMOTIDINE 40 MG PO TABS
ORAL_TABLET | ORAL | 3 refills | Status: DC
Start: 2023-03-25 — End: 2023-08-27

## 2023-03-25 NOTE — Telephone Encounter (Signed)
Patient daughter aware.

## 2023-03-25 NOTE — Telephone Encounter (Signed)
Tried to call no answer

## 2023-03-25 NOTE — Telephone Encounter (Signed)
Spoke with daughter Burna Mortimer and she states she is taking dexilant daily and famotidine as needed. For the last week she has needed it every day. She is out of med. States she has been having a rough time last few weeks with pain under right breast and thinks it is her hernia. States it calms down when she takes mylanta and gas med.

## 2023-03-26 ENCOUNTER — Ambulatory Visit: Payer: Medicare Other | Admitting: Urology

## 2023-03-30 ENCOUNTER — Ambulatory Visit: Payer: Medicare Other | Admitting: Urology

## 2023-03-30 DIAGNOSIS — R3916 Straining to void: Secondary | ICD-10-CM

## 2023-03-30 DIAGNOSIS — M6289 Other specified disorders of muscle: Secondary | ICD-10-CM

## 2023-03-30 DIAGNOSIS — N398 Other specified disorders of urinary system: Secondary | ICD-10-CM

## 2023-04-20 ENCOUNTER — Telehealth (INDEPENDENT_AMBULATORY_CARE_PROVIDER_SITE_OTHER): Payer: Self-pay | Admitting: Gastroenterology

## 2023-04-20 NOTE — Telephone Encounter (Signed)
Spoke to the patient's daughter today regarding most recent esophageal manometry and pH impedance testing.  She was found to have changes consistent with type III achalasia with persistently elevated IRP.  She also had an elevated DeMeester score while off Dexilant consistent with ongoing GERD.  She will benefit from continuing the Dexilant.  She has previously been treated for achalasia with Heller myotomy but symptoms have recurred.  At this point, I advised that she would benefit from being evaluated at [facility] for possible POEM (Dr. Hoy Finlay) versus repeat Heller myotomy, but if she is not a candidate or if she is not interested in this she may need to have an evaluation for PEG tube placement.  The daughter would like to discuss with her this and we will get back to [place] tomorrow during her follow-up appointment.

## 2023-04-21 ENCOUNTER — Encounter (INDEPENDENT_AMBULATORY_CARE_PROVIDER_SITE_OTHER): Payer: Self-pay | Admitting: Gastroenterology

## 2023-04-21 ENCOUNTER — Ambulatory Visit (INDEPENDENT_AMBULATORY_CARE_PROVIDER_SITE_OTHER): Payer: Medicare Other | Admitting: Gastroenterology

## 2023-04-21 VITALS — BP 117/76 | HR 65 | Temp 98.6°F | Ht 59.0 in | Wt 133.5 lb

## 2023-04-21 DIAGNOSIS — R1013 Epigastric pain: Secondary | ICD-10-CM | POA: Diagnosis not present

## 2023-04-21 DIAGNOSIS — K5900 Constipation, unspecified: Secondary | ICD-10-CM

## 2023-04-21 DIAGNOSIS — K219 Gastro-esophageal reflux disease without esophagitis: Secondary | ICD-10-CM

## 2023-04-21 DIAGNOSIS — K2210 Ulcer of esophagus without bleeding: Secondary | ICD-10-CM

## 2023-04-21 DIAGNOSIS — K220 Achalasia of cardia: Secondary | ICD-10-CM | POA: Diagnosis not present

## 2023-04-21 DIAGNOSIS — R1011 Right upper quadrant pain: Secondary | ICD-10-CM

## 2023-04-21 MED ORDER — DEXLANSOPRAZOLE 60 MG PO CPDR
60.0000 mg | DELAYED_RELEASE_CAPSULE | Freq: Every day | ORAL | 3 refills | Status: DC
Start: 2023-04-21 — End: 2023-06-30

## 2023-04-21 NOTE — Patient Instructions (Signed)
Continue dexilant 60mg  daily  Contnue famotidine  Continue with miralax daily  Linzess as needed  We will refer you to [facility] for further evaluation of your swallowing, if you do not hear from them in 2-3 weeks, please let us know I am checking your liver enzymes, we may do an [place] of your abdomen if RUQ pain continues  Follow up 3 months   It was a pleasure to see you today. I want to create trusting relationships with patients and provide genuine, compassionate, and quality care. I truly value your feedback! please be on the lookout for a survey regarding your visit with me today. I appreciate your input about our visit and your time in completing this!    Swan Zayed L. Jeanmarie Hubert, MSN, APRN, AGNP-C Adult-Gerontology Nurse Practitioner [facility]

## 2023-04-21 NOTE — Progress Notes (Unsigned)
Referring Provider: Armando Gang, FNP Primary Care Physician:  Armando Gang, FNP Primary GI Physician: Dr. Levon Hedger   Chief Complaint  Patient presents with   Dysphagia    Follow up on dysphagia. Having a lot of pain in upper abdomen. Concerned about hiatial hernia.    HPI:   [name] is a 82 y.o. female with past medical history of anxiety, diabetes, diabetic gastroparesis, fibromyalgia, chronic idiopathic constipation, GERD, TIA, achalasia status post Heller myotomy, history of colorectal cancer status post resection in 2010   Patient presenting today for follow up of dysphagia, GERD, constipation and with RUQ Pain   Last seen June 2024, at that time swallowing had become worse since she had the esophagram.  Having to do mostly liquids as foods get stuck.  Doing some protein shakes as well.  Having heartburn multiple days per week.  Taking Mylanta, famotidine, Tums as needed.  Having regurgitation wakes her up at night.  Taking Linzess 72 mcg 1-2 times per week, could not tolerate every day as she had diarrhea.  Having 0-2 bowel movements per day.  Not taking Percocet at that time.  Patient recommended to continue Dexilant 60 mg daily, continue Linzess 1-2 times per week, schedule pH impedance and manometry at [facility], continue with liquids and solids not well-tolerated, protein shakes 2-3 times per day to maintain nutrition.  esophageal manometry and pH impedance testing performed on 04/14/23.  found to have changes consistent with type III achalasia with persistently elevated IRP.  She also had an elevated DeMeester score while off Dexilant consistent with ongoing GERD.   Given she has previously been treated for achalasia with Heller myotomy but symptoms have recurred.  Dr. Levon Hedger discussed tehse findings with patient's daughter on 11/11, recommended evaluation at [facility] for possible POEM (Dr. Arlie Solomons) versus repeat Heller myotomy, but if she is not a candidate or if  she is not interested in this she may need to have an evaluation for PEG tube placement. Patient's daughter advised she would discuss this with the patient and let us know her decision at today's visit  Present:  Reports continued issues with dysphagia, is wanting to to be referred to [facility] for possible POEM evaluation.   Still having GERD pretty frequently on dexilant and famotidine. She is trying to do protein shakes and is tolerating some solid foods, usually softer things such as potatoes.   Endorses some RUQ/epigastric pain. Sometimes occurs without eating but sometimes after eating. Sometimes radiates to her back. She does not have a GB but notes this feels like a GB attack, unsure if she had stones at time of GB removal but did have sludge. She has some nausea at times, no vomiting. She has taken mylanta and famotidine sometimes but does not note much improvement in her RUQ pain. Taking tylenol helps some. Daughter states pain can be so bad at times she has to give her a "nerve pill" as patient gets very upset due to the pain. She takes gas x at times which will sometimes improve things over time. No jaundice or pruritus.    Doing miralax most days for constipation which provides good results. Uses linzess only on occasion, no rectal bleeding or melena.   ast EGD: 01/2021 - Normal proximal esophagus. - Esophageal plaques were found, suspicious for candidiasis. Cells for cytology obtained -consistent with Candida. - Abnormal (rule out Barrett's esophagus) mucosa in the esophagus.  Biopsies were consistent with reflux - Z-line irregular, 33 cm from  the incisors. - Small hyperplastic appearing polyp in gastric fundus. Was left alone. - Gastritis. Biopsied, negative for H. pylori or intestinal metaplasia. - A fundoplication was found. The wrap appears loose. - Normal duodenal bulb and second portion of the duodenum.   Last Colonoscopy: 01/2021 - Perianal skin tags found on perianal exam. - The  examined portion of the ileum was normal. - Diverticulosis in the transverse colon. - Two 4 to 6 mm polyps, removed with a cold snare. Resected and retrieved.  Pathology showed tubular adenomas x2 - External hemorrhoids.   Recommendation to repeat colonoscopy in 5 years    Past Medical History:  Diagnosis Date   Anemia    Anxiety    Arthritis    "all over" (04/19/2013)   Asthma    Bleeding stomach ulcer 02/08/1979   Bloating    Chronic back pain    Chronic headaches    Chronic heartburn    Chronic neck pain    Chronic pain    Colon cancer (HCC)    DDD (degenerative disc disease), lumbar    Diabetic peripheral neuropathy (HCC)    "in my feet" (04/19/2013)   Dizziness    Dysphagia    Family history of anesthesia complication    "daughter has PONV too" (04/19/2013)   Fibromyalgia    Gastroesophageal reflux    Gastroparesis    Glaucoma, bilateral    Hiatal hernia    "had it before; had OR; got it again" (04/19/2013)   History of blood transfusion 02/08/1979   "w/bleeding stomach ulcer" (04/19/2013)   History of kidney stones    Migraine    "take RX for it qd" (04/19/2013)   Nausea    Pneumonia 04/09/2012   PONV (postoperative nausea and vomiting)    Renal insufficiency    TIA (transient ischemic attack)    "3-4 before starting RX; none since" (04/19/2013)   Type II diabetes mellitus (HCC)    Walking pneumonia ~ 1966    Past Surgical History:  Procedure Laterality Date   ABDOMINAL ADHESION SURGERY  ~ 2012   "repaired wrap where they did hiatal hernia OR too" 911/04/2013)   ANTERIOR CERVICAL DISCECTOMY  ~ 2009   "only cleaned out arthritis and spurs" (04/19/2013)   BIOPSY  01/09/2021   Procedure: BIOPSY;  Surgeon: Malissa Hippo, MD;  [facility]: [facility];  Service: Endoscopy;;  antrum; distal esophagus;   CATARACT EXTRACTION W/ INTRAOCULAR LENS IMPLANT Left 04/13/2013   CHOLECYSTECTOMY  1980's   COLON SURGERY     COLONOSCOPY  10/11/2010   COLONOSCOPY   11/23/2009   COLONOSCOPY  07/28/2008   W/SNARE   COLONOSCOPY  06/28/07   COLONOSCOPY  05/10/07   W/POLYP   COLONOSCOPY  12/28/00   COLONOSCOPY N/A 06/29/2015   Procedure: COLONOSCOPY;  Surgeon: Malissa Hippo, MD;  [facility]: [facility];  Service: Endoscopy;  Laterality: N/A;  135   COLONOSCOPY WITH ESOPHAGOGASTRODUODENOSCOPY (EGD) N/A 05/13/2013   Procedure: COLONOSCOPY WITH ESOPHAGOGASTRODUODENOSCOPY (EGD);  Surgeon: Malissa Hippo, MD;  [facility]: [facility];  Service: Endoscopy;  Laterality: N/A;  855   COLONOSCOPY WITH PROPOFOL N/A 01/09/2021   Procedure: COLONOSCOPY WITH PROPOFOL;  Surgeon: Malissa Hippo, MD;  [facility]: [facility];  Service: Endoscopy;  Laterality: N/A;  12:50   ESOPHAGOGASTRODUODENOSCOPY (EGD) WITH PROPOFOL N/A 10/06/2018   Procedure: ESOPHAGOGASTRODUODENOSCOPY (EGD) WITH PROPOFOL;  Surgeon: Malissa Hippo, MD;  [facility];  Service: Endoscopy;  Laterality: N/A;   ESOPHAGOGASTRODUODENOSCOPY (EGD)  WITH PROPOFOL N/A 01/09/2021   Procedure: ESOPHAGOGASTRODUODENOSCOPY (EGD) WITH PROPOFOL;  Surgeon: Malissa Hippo, MD;  [facility];  Service: Endoscopy;  Laterality: N/A;   HEMICOLECTOMY  2010   ZIEGLER   HIATAL HERNIA REPAIR  1970's   LEFT HEART CATHETERIZATION WITH CORONARY ANGIOGRAM N/A 04/19/2013   Procedure: LEFT HEART CATHETERIZATION WITH CORONARY ANGIOGRAM;  Surgeon: Peter M Swaziland, MD;  Location: [facility] CATH LAB;  Service: Cardiovascular;  Laterality: N/A;   POLYPECTOMY  10/06/2018   Procedure: POLYPECTOMY;  Surgeon: Malissa Hippo, MD;  [facility];  Service: Endoscopy;;  gastric   POLYPECTOMY  01/09/2021   Procedure: POLYPECTOMY;  Surgeon: Malissa Hippo, MD;  [facility];  Service: Endoscopy;;  transverse;splenic flexure   POSTERIOR FUSION CERVICAL SPINE  1985   "had a broken neck" (04/19/2013)   TONSILLECTOMY  ~ 1953   TOOTH EXTRACTION Bilateral 12/04/2020   Procedure: BILATERAL TEMPOROMANDIBULAR JOINT  ARTHROCENTESIS; DENTAL EXTRACTION TEETH #3,6,7,8,9,10,11,12,19,23,24,25,26,32 WITH ALVEOLOPLASTY;  Surgeon: Ocie Doyne, DMD;  Location: [facility];  Service: Oral Surgery;  Laterality: Bilateral;   UPPER GASTROINTESTINAL ENDOSCOPY  10/11/2010   EGD ED   UPPER GASTROINTESTINAL ENDOSCOPY  11/23/2009   UPPER GASTROINTESTINAL ENDOSCOPY  05/10/07   EGD ED   UPPER GASTROINTESTINAL ENDOSCOPY  08/11/01   EGD ED   VAGINAL HYSTERECTOMY      Current Outpatient Medications  Medication Sig Dispense Refill   acetaminophen (TYLENOL) 500 MG tablet Take 500 mg by mouth every 6 (six) hours as needed for mild pain.      ALPRAZolam (XANAX XR) 1 MG 24 hr tablet Take 0.5-1 mg by mouth as needed for sleep (Anxiety).      AMBULATORY NON FORMULARY MEDICATION Valium (Diazepam) 10 mg vaginal suppository. Dispense #30; 2 refills. Insert 1 suppository into vagina nightly for pelvic floor muscle relaxation. 30 each 2   ascorbic acid (VITAMIN C) 500 MG tablet Take 1,000 mg by mouth daily.     cetirizine (ZYRTEC) 10 MG tablet Take 10 mg by mouth daily.     cholecalciferol (VITAMIN D3) 10 MCG (400 UNIT) TABS tablet Take 5,000 Units by mouth daily.     cyanocobalamin (VITAMIN B12) 1000 MCG tablet Take 1,000 mcg by mouth daily.     Dapagliflozin Propanediol (FARXIGA PO) Take 10 mg by mouth daily at 6 (six) AM.     dexlansoprazole (DEXILANT) 60 MG capsule TAKE 1 CAPSULE BY MOUTH DAILY BEFORE BREAKFAST. 90 capsule 0   dicyclomine (BENTYL) 10 MG capsule Take 1 capsule (10 mg total) by mouth every 12 (twelve) hours as needed for spasms (abdominal pain/discomfort). 60 capsule 2   Docusate Sodium (DSS) 100 MG CAPS Take 100 mg by mouth daily as needed.     DULoxetine (CYMBALTA) 60 MG capsule Take 60 mg by mouth every morning.      Erenumab-aooe (AIMOVIG [place]) Inject into the skin every 30 (thirty) days.     Evolocumab (REPATHA [place]) Inject into the skin. Every two weeks.     famotidine (PEPCID) 40 MG tablet Take one po 30-45 minutes  prior to supper. 90 tablet 3   fluticasone (FLONASE) 50 MCG/ACT nasal spray Place 2 sprays into both nostrils daily.     gabapentin (NEURONTIN) 100 MG capsule Take 2 capsules by mouth 2 (two) times daily.     hydrocortisone (ANUSOL-HC) 2.5 % rectal cream APPLY 1 APPLICATION RECTALLY TWICE DAILY. 30 g 1   Insulin Glargine-Lixisenatide (SOLIQUA ) Inject 15 Units into the skin in  the morning.     linaclotide (LINZESS) 72 MCG capsule Take 1 capsule (72 mcg total) by mouth daily before breakfast. 90 capsule 3   lisinopril (ZESTRIL) 2.5 MG tablet Take 2.5 mg by mouth daily.     loperamide (IMODIUM) 2 MG capsule Take 1 capsule (2 mg total) by mouth 2 (two) times daily as needed for diarrhea or loose stools.     Milnacipran HCl (SAVELLA) 100 MG TABS tablet Take 100 mg by mouth 2 (two) times daily.     Multiple Vitamin (MULTIVITAMIN WITH MINERALS) TABS tablet Take 1 tablet by mouth daily.     polyethylene glycol powder (GLYCOLAX/MIRALAX) 17 GM/SCOOP powder Take 0.5 Containers by mouth daily as needed for mild constipation.     polyvinyl alcohol (LIQUIFILM TEARS) 1.4 % ophthalmic solution Place 1 drop into both eyes 3 (three) times daily as needed for dry eyes.     promethazine (PHENERGAN) 12.5 MG tablet Take 12.5 mg by mouth every 8 (eight) hours as needed.     rizatriptan (MAXALT-MLT) 10 MG disintegrating tablet Take 10 mg by mouth as needed for migraine. May repeat in 2 hours if needed     silodosin (RAPAFLO) 8 MG CAPS capsule Take 8 mg by mouth at bedtime.      tiZANidine (ZANAFLEX) 2 MG tablet Take 2 mg by mouth 3 (three) times daily as needed.     topiramate (TOPAMAX) 100 MG tablet Take 1 tablet (100 mg total) by mouth 2 (two) times daily. 60 tablet 5   traMADol (ULTRAM) 50 MG tablet Take 2 tablets (100 mg total) by mouth in the morning, at noon, and at bedtime. 180 tablet 2   Travoprost, BAK Free, (TRAVATAN) 0.004 % SOLN ophthalmic solution Place 1 drop into both eyes at bedtime.      No current  facility-administered medications for this visit.    Allergies as of 04/21/2023 - Review Complete 04/21/2023  Allergen Reaction Noted   Botox [onabotulinumtoxina]  05/05/2017   Morphine Anaphylaxis and Other (See Comments)    Aspirin Other (See Comments) 05/02/2012   Cefuroxime axetil Nausea And Vomiting 03/25/2013   Codeine Nausea And Vomiting    Diltiazem Nausea And Vomiting 03/25/2013   Dronabinol Nausea And Vomiting and Palpitations 10/24/2013   Shellfish allergy Other (See Comments) 03/11/2011   Other  10/15/2021   Ondansetron Hives 10/28/2011   Penicillins Rash     Family History  Problem Relation Age of Onset   Heart disease Mother    Diabetes Mother    Dementia Father    Healthy Sister    Diabetes Brother    Kidney cancer Brother    Diabetes Brother    Neuropathy Brother    Diabetes Daughter    Diabetes Daughter    Diabetes Son     Social History   Socioeconomic History   Marital status: Widowed    Spouse name: Not on file   Number of children: Not on file   Years of education: Not on file   Highest education level: Not on file  Occupational History    Employer: RETIRED  Tobacco Use   Smoking status: Former    Current packs/day: 0.00    Average packs/day: 1.5 packs/day for 15.0 years (22.5 ttl pk-yrs)    Types: Cigarettes    Start date: 03/16/1976    Quit date: 03/17/1991    Years since quitting: 32.1    Passive exposure: Past   Smokeless tobacco: Never   Tobacco comments:  Patient states that it has ben greater than 20 years since she quit smoking  Vaping Use   Vaping status: Never Used  Substance and Sexual Activity   Alcohol use: No    Alcohol/week: 0.0 standard drinks of alcohol   Drug use: No   Sexual activity: Not Currently  Other Topics Concern   Not on file  Social History Narrative   Patient lives at home alone.    Patient is retired.    Patient is widowed.    Patient has 4 children.   Patient has a 11th grade education.           Social Determinants of Health   Financial Resource Strain: Not on file  Food Insecurity: Not on file  Transportation Needs: Not on file  Physical Activity: Not on file  Stress: Not on file  Social Connections: Not on file    Review of systems General: negative for malaise, night sweats, fever, chills, weight loss Neck: Negative for lumps, goiter, pain and significant neck swelling Resp: Negative for cough, wheezing, dyspnea at rest CV: Negative for chest pain, leg swelling, palpitations, orthopnea GI: denies melena, hematochezia, nausea, vomiting, diarrhea, constipation,odyonophagia, early satiety or unintentional weight loss.  MSK: Negative for joint pain or swelling, back pain, and muscle pain. Derm: Negative for itching or rash Psych: Denies depression, anxiety, memory loss, confusion. No homicidal or suicidal ideation.  Heme: Negative for prolonged bleeding, bruising easily, and swollen nodes. Endocrine: Negative for cold or heat intolerance, polyuria, polydipsia and goiter. Neuro: negative for tremor, gait imbalance, syncope and seizures. The remainder of the review of systems is noncontributory.  Physical Exam: There were no vitals taken for this visit. General:   Alert and oriented. No distress noted. Pleasant and cooperative.  Head:  Normocephalic and atraumatic. Eyes:  Conjuctiva clear without scleral icterus. Mouth:  Oral mucosa pink and moist. Good dentition. No lesions. Heart: Normal rate and rhythm, s1 and s2 heart sounds present.  Lungs: Clear lung sounds in all lobes. Respirations equal and unlabored. Abdomen:  +BS, soft, non-tender and non-distended. No rebound or guarding. No HSM or masses noted. Derm: No palmar erythema or jaundice Msk:  Symmetrical without gross deformities. Normal posture. Extremities:  Without edema. Neurologic:  Alert and  oriented x4 Psych:  Alert and cooperative. Normal mood and affect.  Invalid input(s): "6 MONTHS"    ASSESSMENT: CIARAH ASTIN is a 82 y.o. female presenting today for follow up of dysphagia, GERD and constipation   Dysphagia/GERD: Recent pH impedance and esophageal manometry testing consistent with achalasia type III and elevated DeMeester score consistent with ongoing GERD.  Referral to [facility] for evaluation for POEM versus Heller myotomy was discussed by Dr. Levon Hedger with patient's daughter, patient and her daughter are agreeable to proceed with referral to Dr. Arlie Solomons at [facility] for further evaluation of her achalasia.  Will continue with Dexilant 60 mg daily and famotidine daily.  She should continue to eat softer foods as tolerated and supplement with protein shakes as needed.  Reassuringly her weight has gone up some since last visit.  Constipation: Well-managed with MiraLAX 1-2 capfuls per day.  Taking Linzess only as needed.  Denies rectal bleeding or melena.  Recommend continuing with MiraLAX 1-2 capfuls per day and using Linzess as needed as she is doing.  Epigastric/right upper quadrant pain: Intermittent epigastric/right upper quadrant pain that radiates to right upper back, patient is status post cholecystectomy in the past.  She does note that symptoms feel similar to  previous gallbladder symptoms.  She has some nausea but no vomiting.  Denies jaundice or pruritus.  Will check LFTs today to ensure no elevation, may proceed with abdominal imaging for further evaluation of her symptoms pending results of LFTs.   PLAN:  Continue dexilant 60mg  daily  2. Contnue famotidine 40mg   3. Continue with miralax 1-2 capfuls daily  4. Linzess PRN 5. Refer to Dr. Arlie Solomons at [facility] for POEM/vs repeat HM 6. Check CMP, possible abdominal imaging pending LFT results  All questions were answered, patient verbalized understanding and is in agreement with plan as outlined above.   Follow Up: 3 months   Tammy Bark L. Jeanmarie Hubert, MSN, APRN, AGNP-C Adult-Gerontology Nurse Practitioner [facility]  I have reviewed the note and agree with the APP's assessment as described in this progress note  Katrinka Blazing, MD Gastroenterology and Hepatology [facility]

## 2023-04-22 ENCOUNTER — Other Ambulatory Visit (INDEPENDENT_AMBULATORY_CARE_PROVIDER_SITE_OTHER): Payer: Self-pay | Admitting: Gastroenterology

## 2023-04-22 DIAGNOSIS — K5900 Constipation, unspecified: Secondary | ICD-10-CM | POA: Insufficient documentation

## 2023-04-22 DIAGNOSIS — R1011 Right upper quadrant pain: Secondary | ICD-10-CM | POA: Insufficient documentation

## 2023-04-22 DIAGNOSIS — R1010 Upper abdominal pain, unspecified: Secondary | ICD-10-CM

## 2023-04-22 DIAGNOSIS — R110 Nausea: Secondary | ICD-10-CM | POA: Insufficient documentation

## 2023-04-22 LAB — COMPREHENSIVE METABOLIC PANEL
AG Ratio: 1.4 (calc) (ref 1.0–2.5)
ALT: 42 U/L — ABNORMAL HIGH (ref 6–29)
AST: 13 U/L (ref 10–35)
Albumin: 3.7 g/dL (ref 3.6–5.1)
Alkaline phosphatase (APISO): 127 U/L (ref 37–153)
BUN: 14 mg/dL (ref 7–25)
CO2: 28 mmol/L (ref 20–32)
Calcium: 9.3 mg/dL (ref 8.6–10.4)
Chloride: 103 mmol/L (ref 98–110)
Creat: 0.89 mg/dL (ref 0.60–0.95)
Globulin: 2.7 g/dL (ref 1.9–3.7)
Glucose, Bld: 207 mg/dL — ABNORMAL HIGH (ref 65–99)
Potassium: 3.6 mmol/L (ref 3.5–5.3)
Sodium: 140 mmol/L (ref 135–146)
Total Bilirubin: 0.2 mg/dL (ref 0.2–1.2)
Total Protein: 6.4 g/dL (ref 6.1–8.1)

## 2023-04-27 ENCOUNTER — Ambulatory Visit
Admission: RE | Admit: 2023-04-27 | Discharge: 2023-04-27 | Disposition: A | Discharge status: Home / Self Care | Payer: Medicare Other | Source: Ambulatory Visit | Attending: Anesthesiology | Admitting: Anesthesiology

## 2023-04-27 ENCOUNTER — Ambulatory Visit (HOSPITAL_BASED_OUTPATIENT_CLINIC_OR_DEPARTMENT_OTHER): Payer: Medicare Other | Admitting: Anesthesiology

## 2023-04-27 ENCOUNTER — Ambulatory Visit
Admission: RE | Admit: 2023-04-27 | Discharge: 2023-04-27 | Disposition: A | Discharge status: Home / Self Care | Payer: Medicare Other | Source: Ambulatory Visit | Attending: Gastroenterology | Admitting: Gastroenterology

## 2023-04-27 ENCOUNTER — Other Ambulatory Visit: Payer: Self-pay | Admitting: Anesthesiology

## 2023-04-27 ENCOUNTER — Encounter: Payer: Self-pay | Admitting: Anesthesiology

## 2023-04-27 VITALS — BP 172/91 | HR 57 | Temp 97.2°F | Resp 16 | Ht 59.0 in | Wt 133.0 lb

## 2023-04-27 DIAGNOSIS — G894 Chronic pain syndrome: Secondary | ICD-10-CM | POA: Diagnosis present

## 2023-04-27 DIAGNOSIS — R52 Pain, unspecified: Secondary | ICD-10-CM | POA: Insufficient documentation

## 2023-04-27 DIAGNOSIS — Q762 Congenital spondylolisthesis: Secondary | ICD-10-CM | POA: Insufficient documentation

## 2023-04-27 DIAGNOSIS — R110 Nausea: Secondary | ICD-10-CM | POA: Diagnosis present

## 2023-04-27 DIAGNOSIS — M7918 Myalgia, other site: Secondary | ICD-10-CM | POA: Diagnosis present

## 2023-04-27 DIAGNOSIS — R1010 Upper abdominal pain, unspecified: Secondary | ICD-10-CM | POA: Insufficient documentation

## 2023-04-27 DIAGNOSIS — M542 Cervicalgia: Secondary | ICD-10-CM | POA: Diagnosis present

## 2023-04-27 DIAGNOSIS — M961 Postlaminectomy syndrome, not elsewhere classified: Secondary | ICD-10-CM | POA: Diagnosis present

## 2023-04-27 DIAGNOSIS — M48062 Spinal stenosis, lumbar region with neurogenic claudication: Secondary | ICD-10-CM

## 2023-04-27 DIAGNOSIS — M797 Fibromyalgia: Secondary | ICD-10-CM

## 2023-04-27 DIAGNOSIS — R1310 Dysphagia, unspecified: Secondary | ICD-10-CM | POA: Insufficient documentation

## 2023-04-27 DIAGNOSIS — M5481 Occipital neuralgia: Secondary | ICD-10-CM

## 2023-04-27 MED ORDER — DEXAMETHASONE SODIUM PHOSPHATE 10 MG/ML IJ SOLN
10.0000 mg | Freq: Once | INTRAMUSCULAR | Status: AC
  Administered 2023-04-27: 10 mg
  Filled 2023-04-27: qty 1

## 2023-04-27 MED ORDER — ROPIVACAINE HCL 2 MG/ML IJ SOLN
10.0000 mL | Freq: Once | INTRAMUSCULAR | Status: AC
  Administered 2023-04-27: 10 mL via EPIDURAL
  Filled 2023-04-27: qty 20

## 2023-04-27 MED ORDER — IOHEXOL 300 MG/ML  SOLN
100.0000 mL | Freq: Once | INTRAMUSCULAR | Status: AC | PRN
  Administered 2023-04-27: 100 mL via INTRAVENOUS

## 2023-04-27 NOTE — Patient Instructions (Signed)
______________________________________________________________________    Post-Procedure Discharge Instructions  Instructions: Apply ice:  Purpose: This will minimize any swelling and discomfort after procedure.  When: Day of procedure, as soon as you get home. How: Fill a plastic sandwich bag with crushed ice. Cover it with a small towel and apply to injection site. How long: (15 min on, 15 min off) Apply for 15 minutes then remove x 15 minutes.  Repeat sequence on day of procedure, until you go to bed. Apply heat:  Purpose: To treat any soreness and discomfort from the procedure. When: Starting the next day after the procedure. How: Apply heat to procedure site starting the day following the procedure. How long: May continue to repeat daily, until discomfort goes away. Food intake: Start with clear liquids (like water) and advance to regular food, as tolerated.  Physical activities: Keep activities to a minimum for the first 8 hours after the procedure. After that, then as tolerated. Driving: If you have received any sedation, be responsible and do not drive. You are not allowed to drive for 24 hours after having sedation. Blood thinner: (Applies only to those taking blood thinners) You may restart your blood thinner 6 hours after your procedure. Insulin: (Applies only to Diabetic patients taking insulin) As soon as you can eat, you may resume your normal dosing schedule. Infection prevention: Keep procedure site clean and dry. Shower daily and clean area with soap and water. Post-procedure Pain Diary: Extremely important that this be done correctly and accurately. Recorded information will be used to determine the next step in treatment. For the purpose of accuracy, follow these rules: Evaluate only the area treated. Do not report or include pain from an untreated area. For the purpose of this evaluation, ignore all other areas of pain, except for the treated area. After your procedure,  avoid taking a long nap and attempting to complete the pain diary after you wake up. Instead, set your alarm clock to go off every hour, on the hour, for the initial 8 hours after the procedure. Document the duration of the numbing medicine, and the relief you are getting from it. Do not go to sleep and attempt to complete it later. It will not be accurate. If you received sedation, it is likely that you were given a medication that may cause amnesia. Because of this, completing the diary at a later time may cause the information to be inaccurate. This information is needed to plan your care. Follow-up appointment: Keep your post-procedure follow-up evaluation appointment after the procedure (usually 2 weeks for most procedures, 6 weeks for radiofrequencies). DO NOT FORGET to bring you pain diary with you.   Expect: (What should I expect to see with my procedure?) From numbing medicine (AKA: Local Anesthetics): Numbness or decrease in pain. You may also experience some weakness, which if present, could last for the duration of the local anesthetic. Onset: Full effect within 15 minutes of injected. Duration: It will depend on the type of local anesthetic used. On the average, 1 to 8 hours.  From steroids (Applies only if steroids were used): Decrease in swelling or inflammation. Once inflammation is improved, relief of the pain will follow. Onset of benefits: Depends on the amount of swelling present. The more swelling, the longer it will take for the benefits to be seen. In some cases, up to 10 days. Duration: Steroids will stay in the system x 2 weeks. Duration of benefits will depend on multiple posibilities including persistent irritating factors. Side-effects: If  present, they may typically last 2 weeks (the duration of the steroids). Frequent: Cramps (if they occur, drink Gatorade and take over-the-counter Magnesium 450-500 mg once to twice a day); water retention with temporary weight gain;  increases in blood sugar; decreased immune system response; increased appetite. Occasional: Facial flushing (red, warm cheeks); mood swings; menstrual changes. Uncommon: Long-term decrease or suppression of natural hormones; bone thinning. (These are more common with higher doses or more frequent use. This is why we prefer that our patients avoid having any injection therapies in other practices.)  Very Rare: Severe mood changes; psychosis; aseptic necrosis. From procedure: Some discomfort is to be expected once the numbing medicine wears off. This should be minimal if ice and heat are applied as instructed.  Call if: (When should I call?) You experience numbness and weakness that gets worse with time, as opposed to wearing off. New onset bowel or bladder incontinence. (Applies only to procedures done in the spine)  Emergency Numbers: Durning business hours (Monday - Thursday, 8:00 AM - 4:00 PM) (Friday, 9:00 AM - 12:00 Noon): [phone] After hours: [phone] NOTE: If you are having a problem and are unable connect with, or to talk to a provider, then go to your nearest urgent care or emergency department. If the problem is serious and urgent, please call 911. ______________________________________________________________________     TENS unit

## 2023-04-27 NOTE — Progress Notes (Unsigned)
Nursing Pain Medication Assessment:  Safety precautions to be maintained throughout the outpatient stay will include: orient to surroundings, keep bed in low position, maintain call bell within reach at all times, provide assistance with transfer out of bed and ambulation.  Medication Inspection Compliance: [name] did not comply with our request to bring her pills to be counted. She was reminded that bringing the medication bottles, even when empty, is a requirement.  Medication: None brought in. Pill/Patch Count: None available to be counted. Bottle Appearance: No container available. Did not bring bottle(s) to appointment. Filled Date: N/A Last Medication intake:  Today
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
## 2023-05-11 ENCOUNTER — Telehealth (INDEPENDENT_AMBULATORY_CARE_PROVIDER_SITE_OTHER): Payer: Self-pay

## 2023-05-11 NOTE — Telephone Encounter (Signed)
Patient daughter wants the results to the [facility] impedence testing that was done at [facility] on 04/24/2023. Please advise. Thanks

## 2023-05-13 NOTE — Telephone Encounter (Signed)
Referral was faxed to [facility] on 04/23/23, it take 3-4 weeks for them to process and review the referral at which time they will contact the patient to scheduled the apt

## 2023-05-13 NOTE — Telephone Encounter (Signed)
Patient daughter made aware the referral was made 04/23/2023 and they should be reaching out to her soon.

## 2023-05-13 NOTE — Telephone Encounter (Signed)
We have not made any referral as the daughter never called back to let us know if the patient wanted to undergo POEM or repeat surgical management. Please ask her which one they have decided and we will send the referral. Thanks

## 2023-05-13 NOTE — Telephone Encounter (Signed)
I spoke with [name] patient daughter she says yes she remembers this conversation, but wants to know if we have made the referral to [facility], as she was awaiting this referral. [facility] for possible POEM. Please advise.    Spoke to the patient's daughter today regarding most recent esophageal manometry and pH impedance testing.  She was found to have changes consistent with type III achalasia with persistently elevated IRP.  She also had an elevated DeMeester score while off Dexilant consistent with ongoing GERD.  She will benefit from continuing the Dexilant.  She has previously been treated for achalasia with Heller myotomy but symptoms have recurred.  At this point, I advised that she would benefit from being evaluated at [facility] for possible POEM (Dr. Hoy Finlay) versus repeat Heller myotomy, but if she is not a candidate or if she is not interested in this she may need to have an evaluation for PEG tube placement.  The daughter would like to discuss with her this and we will get back to [place] tomorrow during her follow-up appointment.

## 2023-05-13 NOTE — Telephone Encounter (Signed)
Thanks

## 2023-05-13 NOTE — Telephone Encounter (Signed)
Thanks for the update [name]

## 2023-05-14 ENCOUNTER — Encounter: Payer: Self-pay | Admitting: Urology

## 2023-05-14 ENCOUNTER — Telehealth (INDEPENDENT_AMBULATORY_CARE_PROVIDER_SITE_OTHER): Payer: Self-pay | Admitting: *Deleted

## 2023-05-14 NOTE — Progress Notes (Signed)
Reviewed CT abdomen/pelvis w/ contrast from 04/27/2023: - Benign adrenal myelolipoma.   - Intrarenal 2 mm left nephrolithiasis. - Mild right proximal hydroureter. No hydronephrosis bilaterally. - "The urinary bladder is distended with urine with irregular urinary bladder wall thickening. Right urinary bladder diverticula."  No history of recurrent UTI / chronic bacteriuria. No urine testing results from 04/27/2023 per chart review - no evidence of acute cystitis to explain irregular urinary bladder wall thickening. Will advise cystoscopy for further evaluation to rule out malignancy.  Evette Georges, MSN, FNP-C, [facility] Nurse Practitioner [facility] [place]

## 2023-05-14 NOTE — Telephone Encounter (Signed)
Pt's daughter  Burna Mortimer called back and states she spoke to her mother [name] and she is more than willing to do the colonoscopy.   [phone]

## 2023-05-15 ENCOUNTER — Telehealth: Payer: Self-pay

## 2023-05-15 NOTE — Telephone Encounter (Signed)
Would this need to be with Dr.C? Please advise. Thank you

## 2023-05-15 NOTE — Telephone Encounter (Signed)
-----   Message from [place] sent at 05/14/2023  5:05 PM EST ----- Please let pt know that her GI provider messaged me regarding GU findings on CT abdomen/pelvis w/ contrast from 04/27/2023.   In particular there was irregular urinary bladder wall thickening which warrants further evaluation via cystoscopy. Please assist patient with getting that scheduled with Dr. Annabell Howells.   Other findings: - Benign adrenal myelolipoma. no further follow-up indicated.  - Intrarenal 2 mm left nephrolithiasis. - Mild right proximal hydroureter. No hydronephrosis bilaterally. - Right urinary bladder diverticula.

## 2023-05-15 NOTE — Telephone Encounter (Signed)
Tried calling patient with no answer, unable to leave vm due to mailbox full.

## 2023-05-19 NOTE — Telephone Encounter (Signed)
Left message for daughter Burna Mortimer to return call

## 2023-05-19 NOTE — Telephone Encounter (Signed)
Pt daughter returned call. Pt scheduled for 06/23/23 at 9:30am. Instructions will be sent once pre op has been received.

## 2023-05-19 NOTE — Telephone Encounter (Signed)
Tried calling patient several times with no answer and unable to leave voiced message due to mailbox full. Letter mailed out to patient.

## 2023-06-01 ENCOUNTER — Ambulatory Visit: Payer: Medicare Other | Attending: Anesthesiology | Admitting: Anesthesiology

## 2023-06-01 VITALS — BP 164/61 | HR 53 | Temp 97.5°F | Resp 16 | Ht 59.0 in | Wt 125.0 lb

## 2023-06-01 DIAGNOSIS — G894 Chronic pain syndrome: Secondary | ICD-10-CM | POA: Insufficient documentation

## 2023-06-01 DIAGNOSIS — M5481 Occipital neuralgia: Secondary | ICD-10-CM | POA: Insufficient documentation

## 2023-06-01 DIAGNOSIS — M542 Cervicalgia: Secondary | ICD-10-CM | POA: Insufficient documentation

## 2023-06-01 DIAGNOSIS — M48062 Spinal stenosis, lumbar region with neurogenic claudication: Secondary | ICD-10-CM | POA: Insufficient documentation

## 2023-06-01 DIAGNOSIS — Q762 Congenital spondylolisthesis: Secondary | ICD-10-CM | POA: Diagnosis present

## 2023-06-01 DIAGNOSIS — M961 Postlaminectomy syndrome, not elsewhere classified: Secondary | ICD-10-CM

## 2023-06-01 DIAGNOSIS — M7918 Myalgia, other site: Secondary | ICD-10-CM | POA: Diagnosis present

## 2023-06-01 DIAGNOSIS — M797 Fibromyalgia: Secondary | ICD-10-CM | POA: Insufficient documentation

## 2023-06-01 MED ORDER — DEXAMETHASONE SODIUM PHOSPHATE 10 MG/ML IJ SOLN
INTRAMUSCULAR | Status: AC
  Filled 2023-06-01: qty 1

## 2023-06-01 MED ORDER — ROPIVACAINE HCL 2 MG/ML IJ SOLN
INTRAMUSCULAR | Status: AC
  Filled 2023-06-01: qty 20

## 2023-06-01 NOTE — Patient Instructions (Signed)
____________________________________________________________________________________________  Post-Procedure Discharge Instructions  Instructions: Apply ice:  Purpose: This will minimize any swelling and discomfort after procedure.  When: Day of procedure, as soon as you get home. How: Fill a plastic sandwich bag with crushed ice. Cover it with a small towel and apply to injection site. How long: (15 min on, 15 min off) Apply for 15 minutes then remove x 15 minutes.  Repeat sequence on day of procedure, until you go to bed. Apply heat:  Purpose: To treat any soreness and discomfort from the procedure. When: Starting the next day after the procedure. How: Apply heat to procedure site starting the day following the procedure. How long: May continue to repeat daily, until discomfort goes away. Food intake: Start with clear liquids (like water) and advance to regular food, as tolerated.  Physical activities: Keep activities to a minimum for the first 8 hours after the procedure. After that, then as tolerated. Driving: If you have received any sedation, be responsible and do not drive. You are not allowed to drive for 24 hours after having sedation. Blood thinner: (Applies only to those taking blood thinners) You may restart your blood thinner 6 hours after your procedure. Insulin: (Applies only to Diabetic patients taking insulin) As soon as you can eat, you may resume your normal dosing schedule. Infection prevention: Keep procedure site clean and dry. Shower daily and clean area with soap and water. Post-procedure Pain Diary: Extremely important that this be done correctly and accurately. Recorded information will be used to determine the next step in treatment. For the purpose of accuracy, follow these rules: Evaluate only the area treated. Do not report or include pain from an untreated area. For the purpose of this evaluation, ignore all other areas of pain, except for the treated  area. After your procedure, avoid taking a long nap and attempting to complete the pain diary after you wake up. Instead, set your alarm clock to go off every hour, on the hour, for the initial 8 hours after the procedure. Document the duration of the numbing medicine, and the relief you are getting from it. Do not go to sleep and attempt to complete it later. It will not be accurate. If you received sedation, it is likely that you were given a medication that may cause amnesia. Because of this, completing the diary at a later time may cause the information to be inaccurate. This information is needed to plan your care. Follow-up appointment: Keep your post-procedure follow-up evaluation appointment after the procedure (usually 2 weeks for most procedures, 6 weeks for radiofrequencies). DO NOT FORGET to bring you pain diary with you.   Expect: (What should I expect to see with my procedure?) From numbing medicine (AKA: Local Anesthetics): Numbness or decrease in pain. You may also experience some weakness, which if present, could last for the duration of the local anesthetic. Onset: Full effect within 15 minutes of injected. Duration: It will depend on the type of local anesthetic used. On the average, 1 to 8 hours.  From steroids (Applies only if steroids were used): Decrease in swelling or inflammation. Once inflammation is improved, relief of the pain will follow. Onset of benefits: Depends on the amount of swelling present. The more swelling, the longer it will take for the benefits to be seen. In some cases, up to 10 days. Duration: Steroids will stay in the system x 2 weeks. Duration of benefits will depend on multiple posibilities including persistent irritating factors. Side-effects: If present, they   may typically last 2 weeks (the duration of the steroids). Frequent: Cramps (if they occur, drink Gatorade and take over-the-counter Magnesium 450-500 mg once to twice a day); water retention with  temporary weight gain; increases in blood sugar; decreased immune system response; increased appetite. Occasional: Facial flushing (red, warm cheeks); mood swings; menstrual changes. Uncommon: Long-term decrease or suppression of natural hormones; bone thinning. (These are more common with higher doses or more frequent use. This is why we prefer that our patients avoid having any injection therapies in other practices.)  Very Rare: Severe mood changes; psychosis; aseptic necrosis. From procedure: Some discomfort is to be expected once the numbing medicine wears off. This should be minimal if ice and heat are applied as instructed.  Call if: (When should I call?) You experience numbness and weakness that gets worse with time, as opposed to wearing off. New onset bowel or bladder incontinence. (Applies only to procedures done in the spine)  Emergency Numbers: Durning business hours (Monday - Thursday, 8:00 AM - 4:00 PM) (Friday, 9:00 AM - 12:00 Noon): [phone] After hours: [phone] NOTE: If you are having a problem and are unable connect with, or to talk to a provider, then go to your nearest urgent care or emergency department. If the problem is serious and urgent, please call 911. ____________________________________________________________________________________________   Occipital Nerve Block Patient Information  Description: The occipital nerves originate in the cervical (neck) spinal cord and travel upward through muscle and tissue to supply sensation to the back of the head and top of the scalp.  In addition, the nerves control some of the muscles of the scalp.  Occipital neuralgia is an irritation of these nerves which can cause headaches, numbness of the scalp, and neck discomfort.     The occipital nerve block will interrupt nerve transmission through these nerves and can relieve pain and spasm.  The block consists of insertion of a small needle under the skin in the back of  the head to deposit local anesthetic (numbing medicine) and/or steroids around the nerve.  The entire block usually lasts less than 5 minutes.  Conditions which may be treated by occipital blocks:  Muscular pain and spasm of the scalp Nerve irritation, back of the head Headaches Upper neck pain  Preparation for the injection:  Do not eat any solid food or dairy products within 8 hours of your appointment. You may drink clear liquids up to 3 hours before appointment.  Clear liquids include water, black coffee, juice or soda.  No milk or cream please. You may take your regular medication, including pain medications, with a sip of water before you appointment.  Diabetics should hold regular insulin (if taken separately) and take 1/2 normal NPH dose the morning of the procedure.  Carry some sugar containing items with you to your appointment. A driver must accompany you and be prepared to drive you home after your procedure. Bring all your current medications with you. An IV may be inserted and sedation may be given at the discretion of the physician. A blood pressure cuff, EKG, and other monitors will often be applied during the procedure.  Some patients may need to have extra oxygen administered for a short period. You will be asked to provide medical information, including your allergies and medications, prior to the procedure.  We must know immediately if you are taking blood thinners (like Coumadin/Warfarin) or if you are allergic to IV iodine contrast (dye).  We must know if you could  possible be pregnant.  Do not wear a high collared shirt or turtleneck.  Tie long hair up in the back if possible.  Possible side-effects:  Bleeding from needle site Infection (rare, may require surgery) Nerve injury (rare) Hair on back of neck can be tinged with iodine scrub (this will wash out) Light-headedness (temporary) Pain at injection site (several days) Decreased blood pressure (rare,  temporary) Seizure (very rare)  Call if you experience:  Hives or difficulty breathing ( go to the emergency room) Inflammation or drainage at the injection site(s)  Please note:  Although the local anesthetic injected can often make your painful muscles or headache feel good for several hours after the injection, the pain may return.  It takes 3-7 days for steroids to work.  You may not notice any pain relief for at least one week.  If effective, we will often do a series of injections spaced 3-6 weeks apart to maximally decrease your pain.  If you have any questions, please call [phone] [facility]

## 2023-06-11 ENCOUNTER — Telehealth (INDEPENDENT_AMBULATORY_CARE_PROVIDER_SITE_OTHER): Payer: Self-pay | Admitting: Gastroenterology

## 2023-06-11 NOTE — Telephone Encounter (Signed)
 Pt daughter called in and instructions will be mailed to daughter.

## 2023-06-11 NOTE — Telephone Encounter (Signed)
 Daughter called and left voicemail that pt has misplaced instructions. Attempted to reach daughter but no answer.

## 2023-06-12 ENCOUNTER — Encounter: Payer: Self-pay | Admitting: Anesthesiology

## 2023-06-12 MED ORDER — ROPIVACAINE HCL 2 MG/ML IJ SOLN: 10.0000 mL | Freq: Once | INTRAMUSCULAR | Status: DC

## 2023-06-12 MED ORDER — DEXAMETHASONE SODIUM PHOSPHATE 10 MG/ML IJ SOLN: 10.0000 mg | Freq: Once | INTRAMUSCULAR | Status: DC

## 2023-06-16 NOTE — Patient Instructions (Addendum)
 KENNIA VANVORST  06/16/2023     @PREFPERIOPPHARMACY @   Your procedure is scheduled on  06/23/2023.   Report to [facility] at  0700  A.M.   Call this number if you have problems the morning of surgery:  [phone]  If you experience any cold or flu symptoms such as cough, fever, chills, shortness of breath, etc. between now and your scheduled surgery, please notify us  at the above number.   Remember:  Follow the diet and prep instructions given to you by the office.   You may drink clear liquids until 0500 am on 06/23/2023.    Clear liquids allowed are:                    Water , Juice (No red color; non-citric and without pulp; diabetics please choose diet or no sugar options), Carbonated beverages (diabetics please choose diet or no sugar options), Clear Tea (No creamer, milk, or cream, including half & half and powdered creamer), Black Coffee Only (No creamer, milk or cream, including half & half and powdered creamer), and Clear Sports drink (No red color; diabetics please choose diet or no sugar options)    Take these medicines the morning of surgery with A SIP OF WATER           dexlansoprazole , gabapentin, milnacipron, eizatriptan, topiramate , tramadol  (if needed).     Do not wear jewelry, make-up or nail polish, including gel polish,  artificial nails, or any other type of covering on natural nails (fingers and  toes).  Do not wear lotions, powders, or perfumes, or deodorant.  Do not shave 48 hours prior to surgery.  Men may shave face and neck.  Do not bring valuables to the hospital.  [facility] is not responsible for any belongings or valuables.  Contacts, dentures or bridgework may not be worn into surgery.  Leave your suitcase in the car.  After surgery it may be brought to your room.  For patients admitted to the hospital, discharge time will be determined by your treatment team.  Patients discharged the day of surgery will not be allowed to drive home and  must have someone with them for 24 hours.    Special instructions:   DO NOT smoke tobacco or vape for 24 hours before your procedure.  Please read over the following fact sheets that you were given. Anesthesia Post-op Instructions and Care and Recovery After Surgery      Flexible Sigmoidoscopy, Care After The following information offers guidance on how to care for yourself after your procedure. Your health care provider may also give you more specific instructions. If you have problems or questions, contact your health care provider. What can I expect after the procedure? After the procedure, it is common to have: Cramping or pain in your abdomen. Bloating. A small amount of blood with your stool. This may happen if a sample of tissue was removed for testing (biopsy). Follow these instructions at home: Eating and drinking Drink enough fluid to keep your urine pale yellow. Follow instructions from your health care provider about what you may eat and drink. Go back to your normal diet as told by your health care provider. Avoid heavy or fried foods. These may be hard to digest. Activity  If you were given a sedative during the procedure, it can affect you for several hours. Do not drive or operate machinery until your health care provider says that it is safe.  Rest as told by your health care provider. Do not sit for a long time without moving. Get up to take short walks every 1-2 hours. This will improve blood flow and breathing. Ask for help if you feel weak or unsteady. Return to your normal activities as told by your health care provider. Ask your health care provider what activities are safe for you. General instructions Take over-the-counter and prescription medicines only as told by your health care provider. Try walking around when you have cramps or feel bloated. Contact a health care provider if: You have pain or cramping in your abdomen that gets worse or is not helped with  medicine. You have a small amount of bleeding from your rectum for more than 24 hours after your procedure. You have nausea or vomiting. You feel weak or dizzy. You have a fever. Get help right away if: You pass large blood clots or see a large amount of blood in the toilet after having a bowel movement. You have severe pain in your abdomen. This information is not intended to replace advice given to you by your health care provider. Make sure you discuss any questions you have with your health care provider. Document Revised: 10/30/2021 Document Reviewed: 10/30/2021 Elsevier Patient Education  2024 [employer] Monitored Anesthesia Care, Care After The following information offers guidance on how to care for yourself after your procedure. Your health care provider may also give you more specific instructions. If you have problems or questions, contact your health care provider. What can I expect after the procedure? After the procedure, it is common to have: Tiredness. Little or no memory about what happened during or after the procedure. Impaired judgment when it comes to making decisions. Nausea or vomiting. Some trouble with balance. Follow these instructions at home: For the time period you were told by your health care provider:  Rest. Do not participate in activities where you could fall or become injured. Do not drive or use machinery. Do not drink alcohol. Do not take sleeping pills or medicines that cause drowsiness. Do not make important decisions or sign legal documents. Do not take care of children on your own. Medicines Take over-the-counter and prescription medicines only as told by your health care provider. If you were prescribed antibiotics, take them as told by your health care provider. Do not stop using the antibiotic even if you start to feel better. Eating and drinking Follow instructions from your health care provider about what you may eat and  drink. Drink enough fluid to keep your urine pale yellow. If you vomit: Drink clear fluids slowly and in small amounts as you are able. Clear fluids include water , ice chips, low-calorie sports drinks, and fruit juice that has water  added to it (diluted fruit juice). Eat light and bland foods in small amounts as you are able. These foods include bananas, applesauce, rice, lean meats, toast, and crackers. General instructions  Have a responsible adult stay with you for the time you are told. It is important to have someone help care for you until you are awake and alert. If you have sleep apnea, surgery and some medicines can increase your risk for breathing problems. Follow instructions from your health care provider about wearing your sleep device: When you are sleeping. This includes during daytime naps. While taking prescription pain medicines, sleeping medicines, or medicines that make you drowsy. Do not use any products that contain nicotine or tobacco. These products include cigarettes, chewing tobacco, and vaping  devices, such as e-cigarettes. If you need help quitting, ask your health care provider. Contact a health care provider if: You feel nauseous or vomit every time you eat or drink. You feel light-headed. You are still sleepy or having trouble with balance after 24 hours. You get a rash. You have a fever. You have redness or swelling around the IV site. Get help right away if: You have trouble breathing. You have new confusion after you get home. These symptoms may be an emergency. Get help right away. Call 911. Do not wait to see if the symptoms will go away. Do not drive yourself to the hospital. This information is not intended to replace advice given to you by your health care provider. Make sure you discuss any questions you have with your health care provider. Document Revised: 10/21/2021 Document Reviewed: 10/21/2021 Elsevier Patient Education  2024 [employer].

## 2023-06-18 ENCOUNTER — Encounter (HOSPITAL_COMMUNITY)
Admission: RE | Admit: 2023-06-18 | Discharge: 2023-06-18 | Disposition: A | Discharge status: Home / Self Care | Source: Ambulatory Visit | Attending: Gastroenterology | Admitting: Gastroenterology

## 2023-06-18 ENCOUNTER — Encounter (HOSPITAL_COMMUNITY): Payer: Self-pay

## 2023-06-18 ENCOUNTER — Other Ambulatory Visit (HOSPITAL_COMMUNITY): Payer: Medicare Other

## 2023-06-18 VITALS — BP 164/61 | HR 53 | Temp 97.5°F | Resp 18 | Ht 59.0 in | Wt 125.0 lb

## 2023-06-18 DIAGNOSIS — D509 Iron deficiency anemia, unspecified: Secondary | ICD-10-CM | POA: Diagnosis present

## 2023-06-18 DIAGNOSIS — Z6825 Body mass index (BMI) 25.0-25.9, adult: Secondary | ICD-10-CM

## 2023-06-18 DIAGNOSIS — E119 Type 2 diabetes mellitus without complications: Secondary | ICD-10-CM | POA: Diagnosis not present

## 2023-06-18 LAB — CBC WITH DIFFERENTIAL/PLATELET
Abs Immature Granulocytes: 0.03 10*3/uL (ref 0.00–0.07)
Basophils Absolute: 0 10*3/uL (ref 0.0–0.1)
Basophils Relative: 1 %
Eosinophils Absolute: 0.2 10*3/uL (ref 0.0–0.5)
Eosinophils Relative: 2 %
HCT: 40.7 % (ref 36.0–46.0)
Hemoglobin: 12.6 g/dL (ref 12.0–15.0)
Immature Granulocytes: 0 %
Lymphocytes Relative: 28 %
Lymphs Abs: 2.4 10*3/uL (ref 0.7–4.0)
MCH: 30.7 pg (ref 26.0–34.0)
MCHC: 31 g/dL (ref 30.0–36.0)
MCV: 99 fL (ref 80.0–100.0)
Monocytes Absolute: 0.4 10*3/uL (ref 0.1–1.0)
Monocytes Relative: 5 %
Neutro Abs: 5.5 10*3/uL (ref 1.7–7.7)
Neutrophils Relative %: 64 %
Platelets: 223 10*3/uL (ref 150–400)
RBC: 4.11 MIL/uL (ref 3.87–5.11)
RDW: 14.1 % (ref 11.5–15.5)
WBC: 8.4 10*3/uL (ref 4.0–10.5)
nRBC: 0 % (ref 0.0–0.2)

## 2023-06-23 ENCOUNTER — Ambulatory Visit (HOSPITAL_BASED_OUTPATIENT_CLINIC_OR_DEPARTMENT_OTHER): Payer: Medicare Other | Admitting: Anesthesiology

## 2023-06-23 ENCOUNTER — Ambulatory Visit (HOSPITAL_COMMUNITY): Payer: Medicare Other | Admitting: Anesthesiology

## 2023-06-23 ENCOUNTER — Ambulatory Visit (HOSPITAL_COMMUNITY)
Admission: RE | Admit: 2023-06-23 | Discharge: 2023-06-23 | Disposition: A | Discharge status: Home / Self Care | Payer: Medicare Other | Attending: Gastroenterology | Admitting: Gastroenterology

## 2023-06-23 ENCOUNTER — Encounter (HOSPITAL_COMMUNITY)
Admission: RE | Disposition: A | Discharge status: Home / Self Care | Payer: Self-pay | Source: Home / Self Care | Attending: Gastroenterology

## 2023-06-23 ENCOUNTER — Encounter (HOSPITAL_COMMUNITY): Payer: Self-pay | Admitting: Gastroenterology

## 2023-06-23 DIAGNOSIS — F0390 Unspecified dementia without behavioral disturbance: Secondary | ICD-10-CM | POA: Diagnosis not present

## 2023-06-23 DIAGNOSIS — D125 Benign neoplasm of sigmoid colon: Secondary | ICD-10-CM | POA: Diagnosis not present

## 2023-06-23 DIAGNOSIS — E1142 Type 2 diabetes mellitus with diabetic polyneuropathy: Secondary | ICD-10-CM | POA: Insufficient documentation

## 2023-06-23 DIAGNOSIS — R933 Abnormal findings on diagnostic imaging of other parts of digestive tract: Secondary | ICD-10-CM

## 2023-06-23 DIAGNOSIS — J189 Pneumonia, unspecified organism: Secondary | ICD-10-CM | POA: Diagnosis not present

## 2023-06-23 DIAGNOSIS — Z794 Long term (current) use of insulin: Secondary | ICD-10-CM | POA: Diagnosis not present

## 2023-06-23 DIAGNOSIS — F419 Anxiety disorder, unspecified: Secondary | ICD-10-CM | POA: Diagnosis not present

## 2023-06-23 DIAGNOSIS — N816 Rectocele: Secondary | ICD-10-CM | POA: Insufficient documentation

## 2023-06-23 DIAGNOSIS — K3184 Gastroparesis: Secondary | ICD-10-CM | POA: Insufficient documentation

## 2023-06-23 DIAGNOSIS — Z79899 Other long term (current) drug therapy: Secondary | ICD-10-CM | POA: Insufficient documentation

## 2023-06-23 DIAGNOSIS — K648 Other hemorrhoids: Secondary | ICD-10-CM | POA: Diagnosis not present

## 2023-06-23 DIAGNOSIS — I2510 Atherosclerotic heart disease of native coronary artery without angina pectoris: Secondary | ICD-10-CM | POA: Insufficient documentation

## 2023-06-23 DIAGNOSIS — Z7984 Long term (current) use of oral hypoglycemic drugs: Secondary | ICD-10-CM | POA: Diagnosis not present

## 2023-06-23 DIAGNOSIS — K219 Gastro-esophageal reflux disease without esophagitis: Secondary | ICD-10-CM | POA: Diagnosis not present

## 2023-06-23 DIAGNOSIS — Z87891 Personal history of nicotine dependence: Secondary | ICD-10-CM | POA: Diagnosis not present

## 2023-06-23 DIAGNOSIS — Z9049 Acquired absence of other specified parts of digestive tract: Secondary | ICD-10-CM | POA: Insufficient documentation

## 2023-06-23 DIAGNOSIS — K644 Residual hemorrhoidal skin tags: Secondary | ICD-10-CM | POA: Diagnosis not present

## 2023-06-23 DIAGNOSIS — E1143 Type 2 diabetes mellitus with diabetic autonomic (poly)neuropathy: Secondary | ICD-10-CM | POA: Diagnosis not present

## 2023-06-23 DIAGNOSIS — J45909 Unspecified asthma, uncomplicated: Secondary | ICD-10-CM | POA: Diagnosis not present

## 2023-06-23 DIAGNOSIS — Z85048 Personal history of other malignant neoplasm of rectum, rectosigmoid junction, and anus: Secondary | ICD-10-CM | POA: Diagnosis not present

## 2023-06-23 DIAGNOSIS — R948 Abnormal results of function studies of other organs and systems: Secondary | ICD-10-CM | POA: Diagnosis present

## 2023-06-23 DIAGNOSIS — Z8673 Personal history of transient ischemic attack (TIA), and cerebral infarction without residual deficits: Secondary | ICD-10-CM | POA: Diagnosis not present

## 2023-06-23 DIAGNOSIS — G8929 Other chronic pain: Secondary | ICD-10-CM | POA: Insufficient documentation

## 2023-06-23 DIAGNOSIS — K635 Polyp of colon: Secondary | ICD-10-CM | POA: Diagnosis not present

## 2023-06-23 DIAGNOSIS — M797 Fibromyalgia: Secondary | ICD-10-CM | POA: Insufficient documentation

## 2023-06-23 HISTORY — PX: POLYPECTOMY: SHX149

## 2023-06-23 HISTORY — PX: FLEXIBLE SIGMOIDOSCOPY: SHX5431

## 2023-06-23 LAB — GLUCOSE, CAPILLARY: Glucose-Capillary: 135 mg/dL — ABNORMAL HIGH (ref 70–99)

## 2023-06-23 SURGERY — SIGMOIDOSCOPY, FLEXIBLE
Anesthesia: General

## 2023-06-23 MED ORDER — PROPOFOL 10 MG/ML IV BOLUS
INTRAVENOUS | Status: DC | PRN
  Administered 2023-06-23: 80 mg via INTRAVENOUS
  Administered 2023-06-23: 30 mg via INTRAVENOUS

## 2023-06-23 MED ORDER — LACTATED RINGERS IV SOLN: INTRAVENOUS | Status: DC | PRN

## 2023-06-23 MED ORDER — LIDOCAINE HCL (PF) 2 % IJ SOLN
INTRAMUSCULAR | Status: DC | PRN
  Administered 2023-06-23: 50 mg via INTRADERMAL

## 2023-06-23 NOTE — Anesthesia Procedure Notes (Addendum)
 Date/Time: 06/23/2023 9:40 AM  Performed by: Eliodoro Deward FALCON, CRNAPre-anesthesia Checklist: Patient identified, Emergency Drugs available, Suction available and Patient being monitored Patient Re-evaluated:Patient Re-evaluated prior to induction Oxygen Delivery Method: Nasal cannula Induction Type: IV induction Placement Confirmation: positive ETCO2

## 2023-06-23 NOTE — Transfer of Care (Signed)
 Immediate Anesthesia Transfer of Care Note  Patient: [name]  Procedure(s) Performed: FLEXIBLE SIGMOIDOSCOPY POLYPECTOMY INTESTINAL  Patient Location: Short Stay  Anesthesia Type:General  Level of Consciousness: awake, alert , and oriented  Airway & Oxygen Therapy: Patient Spontanous Breathing  Post-op Assessment: Report given to RN and Post -op Vital signs reviewed and stable  Post vital signs: Reviewed and stable  Last Vitals:  Vitals Value Taken Time  BP    Temp 36.9 C 06/23/23 0955  Pulse    Resp    SpO2      Last Pain:  Vitals:   06/23/23 0955  TempSrc: Oral  PainSc:       Patients Stated Pain Goal: 4 (06/23/23 0713)  Complications: No notable events documented.

## 2023-06-23 NOTE — Discharge Instructions (Signed)
You are being discharged to home.  Resume your previous diet.  We are waiting for your pathology results.  

## 2023-06-23 NOTE — Anesthesia Postprocedure Evaluation (Signed)
 Anesthesia Post Note  Patient: [name]  Procedure(s) Performed: FLEXIBLE SIGMOIDOSCOPY POLYPECTOMY INTESTINAL  Patient location during evaluation: PACU Anesthesia Type: General Level of consciousness: awake and alert Pain management: pain level controlled Vital Signs Assessment: post-procedure vital signs reviewed and stable Respiratory status: spontaneous breathing, nonlabored ventilation, respiratory function stable and patient connected to nasal cannula oxygen Cardiovascular status: blood pressure returned to baseline and stable Postop Assessment: no apparent nausea or vomiting Anesthetic complications: no   There were no known notable events for this encounter.   Last Vitals:  Vitals:   06/23/23 0715 06/23/23 0955  BP: (!) 171/68 111/83  Pulse: (!) 54 (!) 57  Resp: 18 16  Temp: 36.9 C 36.9 C  SpO2: 100% 100%    Last Pain:  Vitals:   06/23/23 0955  TempSrc: Oral  PainSc: 0-No pain                 Addylin Manke L Abel Ra

## 2023-06-23 NOTE — Op Note (Signed)
 [facility] Patient Name: [name] Procedure Date: 06/23/2023 9:26 AM MRN: [id number] Date of Birth: 11-18-1940 Attending MD: Toribio Fortune , , [id number] CSN: [id number] Age: 83 Admit Type: Outpatient Procedure:                Flexible Sigmoidoscopy Indications:              Abnormal CT of the GI tract Providers:                Toribio Fortune, Crystal Page, Kristine L. Shirlean Balm, Technician Referring MD:              Medicines:                Monitored Anesthesia Care Complications:            No immediate complications. Estimated Blood Loss:     Estimated blood loss: none. Procedure:                Pre-Anesthesia Assessment:                           - Prior to the procedure, a History and Physical                            was performed, and patient medications, allergies                            and sensitivities were reviewed. The patient's                            tolerance of previous anesthesia was reviewed.                           - The risks and benefits of the procedure and the                            sedation options and risks were discussed with the                            patient. All questions were answered and informed                            consent was obtained.                           - ASA Grade Assessment: III - A patient with severe                            systemic disease.                           After obtaining informed consent, the scope was                            passed under direct vision. The PCF-[id number]                            (  [id number]) was introduced through the anus and                            advanced to the the sigmoid colon. The flexible                            sigmoidoscopy was accomplished without difficulty.                            The patient tolerated the procedure well. The                            quality of the bowel preparation was good. Scope In: 9:46:07  AM Scope Out: 9:51:59 AM Total Procedure Duration: 0 hours 5 minutes 52 seconds  Findings:      External hemorrhoids were found on perianal exam.      The perianal exam findings include rectocele.      A 3 mm polyp was found in the sigmoid colon. The polyp was sessile. The       polyp was removed with a cold snare. Resection and retrieval were       complete.      Non-bleeding internal hemorrhoids were found during retroflexion. The       hemorrhoids were small. Impression:               - Hemorrhoids found on perianal exam.                           - Rectocele found on perianal exam.                           - One 3 mm polyp in the sigmoid colon, removed with                            a cold snare. Resected and retrieved.                           - Non-bleeding internal hemorrhoids. Moderate Sedation:      Per Anesthesia Care Recommendation:           - Discharge patient to home (ambulatory).                           - Resume previous diet.                           - Await pathology results. Procedure Code(s):        --- Professional ---                           [phone], Sigmoidoscopy, flexible; with removal of                            tumor(s), polyp(s), or other lesion(s) by snare                            technique Diagnosis Code(s):        ---  Professional ---                           D12.5, Benign neoplasm of sigmoid colon                           K64.8, Other hemorrhoids                           R93.3, Abnormal findings on diagnostic imaging of                            other parts of digestive tract CPT copyright 2022 American [employer] Association. All rights reserved. The codes documented in this report are preliminary and upon coder review may  be revised to meet current compliance requirements. Toribio Fortune, MD Toribio Fortune,  06/23/2023 9:55:38 AM This report has been signed electronically. Number of Addenda: 0

## 2023-06-23 NOTE — Anesthesia Preprocedure Evaluation (Addendum)
 Anesthesia Evaluation  Patient identified by MRN, date of birth, ID band Patient awake    Reviewed: Allergy & Precautions, NPO status , Patient's Chart, lab work & pertinent test results  History of Anesthesia Complications (+) PONV, Family history of anesthesia reaction and history of anesthetic complications  Airway Mallampati: II  TM Distance: >3 FB Neck ROM: Full   Comment: Chronic neck pain ACDF Dental  (+) Edentulous Upper, Edentulous Lower   Pulmonary asthma , pneumonia, former smoker   Pulmonary exam normal breath sounds clear to auscultation       Cardiovascular Exercise Tolerance: Good + angina  + CAD  Normal cardiovascular exam Rhythm:Regular Rate:Normal  10/24/20 stress test There was no ST segment deviation noted during stress. The left ventricular ejection fraction is normal (55-65%). Nuclear stress EF: 62%. Defect 1: There is a medium defect of moderate severity present in the basal inferolateral, mid inferolateral, apical inferior and apical lateral location. Findings consistent with prior myocardial infarction vs artifact This is a low risk study.   1. Fixed inferolateral perfusion defect with normal wall motion in this area, consistent with artifact.  There is significant extracardiac radiotracer activity adjacent to inferolateral wall, likely causing artifact 2. Low risk study      Neuro/Psych  Headaches PSYCHIATRIC DISORDERS Anxiety    Dementia Post concussion syndrome  TIA Neuromuscular disease    GI/Hepatic hiatal hernia, PUD,GERD  Medicated,,  Endo/Other  diabetes, Well Controlled, Type 2, Oral Hypoglycemic Agents    Renal/GU Renal InsufficiencyRenal disease     Musculoskeletal  (+) Arthritis  (chronic back pain, neck pain, ACDF),  Fibromyalgia -  Abdominal   Peds  Hematology  (+) Blood dyscrasia, anemia   Anesthesia Other Findings   Reproductive/Obstetrics                               Anesthesia Physical Anesthesia Plan  ASA: 3  Anesthesia Plan: General   Post-op Pain Management:    Induction: Intravenous  PONV Risk Score and Plan: Propofol  infusion  Airway Management Planned: Nasal Cannula and Natural Airway  Additional Equipment:   Intra-op Plan:   Post-operative Plan:   Informed Consent: I have reviewed the patients History and Physical, chart, labs and discussed the procedure including the risks, benefits and alternatives for the proposed anesthesia with the patient or authorized representative who has indicated his/her understanding and acceptance.     Dental advisory given  Plan Discussed with: CRNA and Surgeon  Anesthesia Plan Comments:          Anesthesia Quick Evaluation

## 2023-06-23 NOTE — H&P (Signed)
 [name] is an 83 y.o. female.   Chief Complaint: abnormal CT scan suggesting rectal thickening. HPI: [name] is a 83 y.o. female with past medical history of anxiety, diabetes, diabetic gastroparesis, fibromyalgia, chronic idiopathic constipation, GERD, TIA, achalasia status post Heller myotomy, history of colorectal cancer status post resection in 2010, coming for evaluation of abnormal CT scan suggesting rectal thickening.  The patient denies having any nausea, vomiting, fever, chills, hematochezia, melena, hematemesis, abdominal distention, abdominal pain, diarrhea, jaundice, pruritus or weight loss.  Patient had a CT of the abdomen and pelvis with contrast on 04/27/2023 that showed rectal wall thickening.   Past Medical History:  Diagnosis Date   Anemia    Anxiety    Arthritis    all over (04/19/2013)   Asthma    Bleeding stomach ulcer 02/08/1979   Bloating    Chronic back pain    Chronic headaches    Chronic heartburn    Chronic neck pain    Chronic pain    Colon cancer (HCC)    DDD (degenerative disc disease), lumbar    Diabetic peripheral neuropathy (HCC)    in my feet (04/19/2013)   Dizziness    Dysphagia    Family history of anesthesia complication    daughter has PONV too (04/19/2013)   Fibromyalgia    Gastroesophageal reflux    Gastroparesis    Glaucoma, bilateral    Hiatal hernia    had it before; had OR; got it again (04/19/2013)   History of blood transfusion 02/08/1979   w/bleeding stomach ulcer (04/19/2013)   History of kidney stones    Migraine    take RX for it qd (04/19/2013)   Nausea    Pneumonia 04/09/2012   PONV (postoperative nausea and vomiting)    Renal insufficiency    TIA (transient ischemic attack)    3-4 before starting RX; none since (04/19/2013)   Type II diabetes mellitus (HCC)    Walking pneumonia ~ 1966    Past Surgical History:  Procedure Laterality Date   ABDOMINAL ADHESION SURGERY  ~ 2012    repaired wrap where they did hiatal hernia OR too 911/04/2013)   ANTERIOR CERVICAL DISCECTOMY  ~ 2009   only cleaned out arthritis and spurs (04/19/2013)   BIOPSY  01/09/2021   Procedure: BIOPSY;  Surgeon: Golda Claudis PENNER, MD;  Location: [facility];  Service: Endoscopy;;  antrum; distal esophagus;   CATARACT EXTRACTION W/ INTRAOCULAR LENS IMPLANT Left 04/13/2013   CHOLECYSTECTOMY  1980's   COLON SURGERY     COLONOSCOPY  10/11/2010   COLONOSCOPY  11/23/2009   COLONOSCOPY  07/28/2008   W/SNARE   COLONOSCOPY  06/28/07   COLONOSCOPY  05/10/07   W/POLYP   COLONOSCOPY  12/28/00   COLONOSCOPY N/A 06/29/2015   Procedure: COLONOSCOPY;  Surgeon: Claudis PENNER Golda, MD;  Location: [facility];  Service: Endoscopy;  Laterality: N/A;  135   COLONOSCOPY WITH ESOPHAGOGASTRODUODENOSCOPY (EGD) N/A 05/13/2013   Procedure: COLONOSCOPY WITH ESOPHAGOGASTRODUODENOSCOPY (EGD);  Surgeon: Claudis PENNER Golda, MD;  Location: [facility];  Service: Endoscopy;  Laterality: N/A;  855   COLONOSCOPY WITH PROPOFOL  N/A 01/09/2021   Procedure: COLONOSCOPY WITH PROPOFOL ;  Surgeon: Golda Claudis PENNER, MD;  Location: [facility];  Service: Endoscopy;  Laterality: N/A;  12:50   ESOPHAGOGASTRODUODENOSCOPY (EGD) WITH PROPOFOL  N/A 10/06/2018   Procedure: ESOPHAGOGASTRODUODENOSCOPY (EGD) WITH PROPOFOL ;  Surgeon: Golda Claudis PENNER, MD;  Location: [facility];  Service: Endoscopy;  Laterality: N/A;  ESOPHAGOGASTRODUODENOSCOPY (EGD) WITH PROPOFOL  N/A 01/09/2021   Procedure: ESOPHAGOGASTRODUODENOSCOPY (EGD) WITH PROPOFOL ;  Surgeon: Golda Claudis PENNER, MD;  Location: [facility];  Service: Endoscopy;  Laterality: N/A;   HEMICOLECTOMY  2010   ZIEGLER   HIATAL HERNIA REPAIR  1970's   LEFT HEART CATHETERIZATION WITH CORONARY ANGIOGRAM N/A 04/19/2013   Procedure: LEFT HEART CATHETERIZATION WITH CORONARY ANGIOGRAM;  Surgeon: Peter M Jordan, MD;  Location: [facility];  Service: Cardiovascular;  Laterality: N/A;   POLYPECTOMY  10/06/2018    Procedure: POLYPECTOMY;  Surgeon: Golda Claudis PENNER, MD;  Location: [facility];  Service: Endoscopy;;  gastric   POLYPECTOMY  01/09/2021   Procedure: POLYPECTOMY;  Surgeon: Golda Claudis PENNER, MD;  Location: [facility];  Service: Endoscopy;;  transverse;splenic flexure   POSTERIOR FUSION CERVICAL SPINE  1985   had a broken neck (04/19/2013)   TONSILLECTOMY  ~ 1953   TOOTH EXTRACTION Bilateral 12/04/2020   Procedure: BILATERAL TEMPOROMANDIBULAR JOINT ARTHROCENTESIS; DENTAL EXTRACTION TEETH #3,6,7,8,9,10,11,12,19,23,24,25,26,32 WITH ALVEOLOPLASTY;  Surgeon: Sheryle Hamilton, DMD;  Location: [facility];  Service: Oral Surgery;  Laterality: Bilateral;   UPPER GASTROINTESTINAL ENDOSCOPY  10/11/2010   EGD ED   UPPER GASTROINTESTINAL ENDOSCOPY  11/23/2009   UPPER GASTROINTESTINAL ENDOSCOPY  05/10/07   EGD ED   UPPER GASTROINTESTINAL ENDOSCOPY  08/11/01   EGD ED   VAGINAL HYSTERECTOMY      Family History  Problem Relation Age of Onset   Heart disease Mother    Diabetes Mother    Dementia Father    Healthy Sister    Diabetes Brother    Kidney cancer Brother    Diabetes Brother    Neuropathy Brother    Diabetes Daughter    Diabetes Daughter    Diabetes Son    Social History:  reports that she quit smoking about 32 years ago. Her smoking use included cigarettes. She started smoking about 47 years ago. She has a 22.5 pack-year smoking history. She has been exposed to tobacco smoke. She has never used smokeless tobacco. She reports that she does not drink alcohol and does not use drugs.  Allergies:  Allergies  Allergen Reactions   Botox [Onabotulinumtoxina]     Patient states that it became loose in her stomach , and it caused multiple problems.   Morphine Anaphylaxis and Other (See Comments)    it will kill me made me stop breathing   Aspirin  Other (See Comments)    Gi bleed    Cefuroxime  Axetil Nausea And Vomiting   Codeine Nausea And Vomiting   Diltiazem  Nausea And Vomiting    Dronabinol Nausea And Vomiting and Palpitations   Shellfish Allergy Other (See Comments)    Blood sugar drops   Other     Mold and trees   Ondansetron  Hives   Penicillins Rash    Did it involve swelling of the face/tongue/throat, SOB, or low BP? No Did it involve sudden or severe rash/hives, skin peeling, or any reaction on the inside of your mouth or nose? Yes Did you need to seek medical attention at a hospital or doctor's office? Yes When did it last happen?  Over 10 years  If all above answers are "NO", may proceed with cephalosporin use.     Medications Prior to Admission  Medication Sig Dispense Refill   acetaminophen  (TYLENOL ) 500 MG tablet Take 500 mg by mouth every 6 (six) hours as needed for mild pain.      ALPRAZolam  (XANAX  XR) 1 MG 24 hr tablet  Take 0.5-1 mg by mouth as needed for sleep (Anxiety).      AMBULATORY NON FORMULARY MEDICATION Valium (Diazepam) 10 mg vaginal suppository. Dispense #30; 2 refills. Insert 1 suppository into vagina nightly for pelvic floor muscle relaxation. 30 each 2   ascorbic acid (VITAMIN C) 500 MG tablet Take 1,000 mg by mouth daily.     cetirizine (ZYRTEC) 10 MG tablet Take 10 mg by mouth daily.     cholecalciferol (VITAMIN D3) 10 MCG (400 UNIT) TABS tablet Take 5,000 Units by mouth daily.     cyanocobalamin  (VITAMIN B12) 1000 MCG tablet Take 1,000 mcg by mouth daily.     dexlansoprazole  (DEXILANT ) 60 MG capsule Take 1 capsule (60 mg total) by mouth daily. 90 capsule 3   dicyclomine  (BENTYL ) 10 MG capsule Take 1 capsule (10 mg total) by mouth every 12 (twelve) hours as needed for spasms (abdominal pain/discomfort). 60 capsule 2   Docusate Sodium  (DSS) 100 MG CAPS Take 100 mg by mouth daily as needed.     Erenumab-aooe (AIMOVIG [place]) Inject into the skin every 30 (thirty) days.     Evolocumab (REPATHA ) Inject into the skin. Every two weeks.     famotidine  (PEPCID ) 40 MG tablet Take one po 30-45 minutes prior to supper. 90 tablet 3   fluticasone   (FLONASE ) 50 MCG/ACT nasal spray Place 2 sprays into both nostrils daily.     gabapentin (NEURONTIN) 100 MG capsule Take 2 capsules by mouth 2 (two) times daily.     hydrocortisone  (ANUSOL -HC) 2.5 % rectal cream APPLY 1 APPLICATION RECTALLY TWICE DAILY. 30 g 1   Insulin  Glargine-Lixisenatide (SOLIQUA ) Inject 15 Units into the skin in the morning.     linaclotide  (LINZESS ) 72 MCG capsule Take 1 capsule (72 mcg total) by mouth daily before breakfast. 90 capsule 3   lisinopril  (ZESTRIL ) 2.5 MG tablet Take 2.5 mg by mouth daily.     loperamide  (IMODIUM ) 2 MG capsule Take 1 capsule (2 mg total) by mouth 2 (two) times daily as needed for diarrhea or loose stools.     Milnacipran HCl (SAVELLA) 100 MG TABS tablet Take 100 mg by mouth 2 (two) times daily.     Multiple Vitamin (MULTIVITAMIN WITH MINERALS) TABS tablet Take 1 tablet by mouth daily.     polyethylene glycol powder (GLYCOLAX /MIRALAX ) 17 GM/SCOOP powder Take 0.5 Containers by mouth daily as needed for mild constipation.     polyvinyl alcohol (LIQUIFILM TEARS) 1.4 % ophthalmic solution Place 1 drop into both eyes 3 (three) times daily as needed for dry eyes.     promethazine  (PHENERGAN ) 12.5 MG tablet Take 12.5 mg by mouth every 8 (eight) hours as needed.     rizatriptan (MAXALT-MLT) 10 MG disintegrating tablet Take 10 mg by mouth as needed for migraine. May repeat in 2 hours if needed     silodosin (RAPAFLO) 8 MG CAPS capsule Take 8 mg by mouth at bedtime.      tiZANidine (ZANAFLEX) 2 MG tablet Take 2 mg by mouth 3 (three) times daily as needed.     topiramate  (TOPAMAX ) 100 MG tablet Take 1 tablet (100 mg total) by mouth 2 (two) times daily. 60 tablet 5   Travoprost, BAK Free, (TRAVATAN) 0.004 % SOLN ophthalmic solution Place 1 drop into both eyes at bedtime.      Dapagliflozin Propanediol (FARXIGA PO) Take 10 mg by mouth daily at 6 (six) AM.      Results for orders placed or performed during the hospital  encounter of 06/23/23 (from the past  48 hours)  Glucose, capillary     Status: Abnormal   Collection Time: 06/23/23  7:00 AM  Result Value Ref Range   Glucose-Capillary 135 (H) 70 - 99 mg/dL    Comment: Glucose reference range applies only to samples taken after fasting for at least 8 hours.   No results found.  Review of Systems  All other systems reviewed and are negative.   Blood pressure (!) 171/68, pulse (!) 54, temperature 98.4 F (36.9 C), resp. rate 18, height 4' 11 (1.499 m), weight 56.7 kg, SpO2 100%. Physical Exam  GENERAL: The patient is AO x3, in no acute distress. HEENT: Head is normocephalic and atraumatic. EOMI are intact. Mouth is well hydrated and without lesions. NECK: Supple. No masses LUNGS: Clear to auscultation. No presence of rhonchi/wheezing/rales. Adequate chest expansion HEART: RRR, normal s1 and s2. ABDOMEN: Soft, nontender, no guarding, no peritoneal signs, and nondistended. BS +. No masses. RECTAL EXAM: deferred EXTREMITIES: Without any cyanosis, clubbing, rash, lesions or edema. NEUROLOGIC: AOx3, no focal motor deficit. SKIN: no jaundice, no rashes  Assessment/Plan WILBA MUTZ is a 83 y.o. female with past medical history of anxiety, diabetes, diabetic gastroparesis, fibromyalgia, chronic idiopathic constipation, GERD, TIA, achalasia status post Heller myotomy, history of colorectal cancer status post resection in 2010, coming for evaluation of abnormal CT scan suggesting rectal thickening.  We will proceed with flexible sigmoidoscopy.  Toribio Eartha Flavors, MD 06/23/2023, 9:37 AM

## 2023-06-24 LAB — SURGICAL PATHOLOGY

## 2023-06-25 ENCOUNTER — Encounter (HOSPITAL_COMMUNITY): Payer: Self-pay | Admitting: Gastroenterology

## 2023-06-30 ENCOUNTER — Other Ambulatory Visit (INDEPENDENT_AMBULATORY_CARE_PROVIDER_SITE_OTHER): Payer: Self-pay | Admitting: Gastroenterology

## 2023-06-30 DIAGNOSIS — K219 Gastro-esophageal reflux disease without esophagitis: Secondary | ICD-10-CM

## 2023-07-01 ENCOUNTER — Ambulatory Visit: Payer: Medicare Other | Attending: Anesthesiology | Admitting: Anesthesiology

## 2023-07-01 ENCOUNTER — Encounter: Payer: Self-pay | Admitting: Anesthesiology

## 2023-07-01 VITALS — BP 131/83 | HR 62 | Temp 97.2°F | Resp 16 | Ht 59.0 in | Wt 130.0 lb

## 2023-07-01 DIAGNOSIS — G8929 Other chronic pain: Secondary | ICD-10-CM | POA: Insufficient documentation

## 2023-07-01 DIAGNOSIS — M542 Cervicalgia: Secondary | ICD-10-CM | POA: Insufficient documentation

## 2023-07-01 DIAGNOSIS — M5431 Sciatica, right side: Secondary | ICD-10-CM | POA: Insufficient documentation

## 2023-07-01 DIAGNOSIS — Q762 Congenital spondylolisthesis: Secondary | ICD-10-CM | POA: Insufficient documentation

## 2023-07-01 DIAGNOSIS — M545 Low back pain, unspecified: Secondary | ICD-10-CM | POA: Insufficient documentation

## 2023-07-01 DIAGNOSIS — M5481 Occipital neuralgia: Secondary | ICD-10-CM | POA: Insufficient documentation

## 2023-07-01 DIAGNOSIS — M48062 Spinal stenosis, lumbar region with neurogenic claudication: Secondary | ICD-10-CM | POA: Insufficient documentation

## 2023-07-01 MED ORDER — TRAMADOL HCL 50 MG PO TABS: 50.0000 mg | ORAL_TABLET | Freq: Three times a day (TID) | ORAL | 2 refills | Status: DC

## 2023-07-01 NOTE — Progress Notes (Signed)
Nursing Pain Medication Assessment:  Safety precautions to be maintained throughout the outpatient stay will include: orient to surroundings, keep bed in low position, maintain call bell within reach at all times, provide assistance with transfer out of bed and ambulation.  Medication Inspection Compliance: [name] did not comply with our request to bring her pills to be counted. She was reminded that bringing the medication bottles, even when empty, is a requirement.  Medication: None brought in. Pill/Patch Count: None available to be counted. Bottle Appearance: No container available. Did not bring bottle(s) to appointment. Filled Date: N/A Last Medication intake:  Today Safety precautions to be maintained throughout the outpatient stay will include: orient to surroundings, keep bed in low position, maintain call bell within reach at all times, provide assistance with transfer out of bed and ambulation.

## 2023-07-01 NOTE — Progress Notes (Signed)
Subjective:  Patient ID: [name], female    DOB: Jun 02, 1941  Age: 83 y.o. MRN: [id number]  CC: Back Pain (lower)   Procedure: None  HPI [name] presents for reevaluation.  [name] is doing reasonably well in regards to her bilateral trapezius pain and neck pain.  This is responded favorably to the recent trigger point injections.  She is having less tension and fewer tension headaches in the neck region.  She is also having less tension in the trapezius musculature.  Her typical fibromyalgia symptoms have been worse with the recent cold weather spell but otherwise in regards to her neck and shoulder pain, this has been better.  She is doing her stretching exercises and trying to do some range of motion exercises to the glenohumeral joint which seem to be helping as well.  She was recently seen by [facility] and is having some sciatica symptoms.  She has a history of spinal stenosis with degenerative disc disease and is now experiencing some radiation of pain from the low back into the right anterior thigh.  No associated weakness is reported at this time.  The pain has been quite substantial and is causing limited activity and range of motion.  Bowel and bladder function has been fine.  She does take tramadol 3 times a day and is out of this at this time.  She would like to proceed with an epidural steroid injection as advised by her orthopedic physicians to see if this can help with the sciatica.  Outpatient Medications Prior to Visit  Medication Sig Dispense Refill   acetaminophen (TYLENOL) 500 MG tablet Take 500 mg by mouth every 6 (six) hours as needed for mild pain.      ALPRAZolam (XANAX XR) 1 MG 24 hr tablet Take 0.5-1 mg by mouth as needed for sleep (Anxiety).      AMBULATORY NON FORMULARY MEDICATION Valium (Diazepam) 10 mg vaginal suppository. Dispense #30; 2 refills. Insert 1 suppository into vagina nightly for pelvic floor muscle relaxation. 30 each 2   ascorbic acid  (VITAMIN C) 500 MG tablet Take 1,000 mg by mouth daily.     cetirizine (ZYRTEC) 10 MG tablet Take 10 mg by mouth daily.     cholecalciferol (VITAMIN D3) 10 MCG (400 UNIT) TABS tablet Take 5,000 Units by mouth daily.     cyanocobalamin (VITAMIN B12) 1000 MCG tablet Take 1,000 mcg by mouth daily.     Dapagliflozin Propanediol (FARXIGA PO) Take 10 mg by mouth daily at 6 (six) AM.     dexlansoprazole (DEXILANT) 60 MG capsule TAKE 1 CAPSULE BY MOUTH DAILY BEFORE BREAKFAST. 90 capsule 0   dicyclomine (BENTYL) 10 MG capsule Take 1 capsule (10 mg total) by mouth every 12 (twelve) hours as needed for spasms (abdominal pain/discomfort). 60 capsule 2   Docusate Sodium (DSS) 100 MG CAPS Take 100 mg by mouth daily as needed.     Erenumab-aooe (AIMOVIG [place]) Inject into the skin every 30 (thirty) days.     Evolocumab (REPATHA [place]) Inject into the skin. Every two weeks.     famotidine (PEPCID) 40 MG tablet Take one po 30-45 minutes prior to supper. 90 tablet 3   fluticasone (FLONASE) 50 MCG/ACT nasal spray Place 2 sprays into both nostrils daily.     gabapentin (NEURONTIN) 100 MG capsule Take 2 capsules by mouth 2 (two) times daily.     hydrocortisone (ANUSOL-HC) 2.5 % rectal cream APPLY 1 APPLICATION RECTALLY TWICE DAILY. 30 g 1  Insulin Glargine-Lixisenatide (SOLIQUA [place]) Inject 15 Units into the skin in the morning.     linaclotide (LINZESS) 72 MCG capsule Take 1 capsule (72 mcg total) by mouth daily before breakfast. 90 capsule 3   lisinopril (ZESTRIL) 2.5 MG tablet Take 2.5 mg by mouth daily.     loperamide (IMODIUM) 2 MG capsule Take 1 capsule (2 mg total) by mouth 2 (two) times daily as needed for diarrhea or loose stools.     Milnacipran HCl (SAVELLA) 100 MG TABS tablet Take 100 mg by mouth 2 (two) times daily.     Multiple Vitamin (MULTIVITAMIN WITH MINERALS) TABS tablet Take 1 tablet by mouth daily.     polyethylene glycol powder (GLYCOLAX/MIRALAX) 17 GM/SCOOP powder Take 0.5 Containers by mouth daily as  needed for mild constipation.     polyvinyl alcohol (LIQUIFILM TEARS) 1.4 % ophthalmic solution Place 1 drop into both eyes 3 (three) times daily as needed for dry eyes.     promethazine (PHENERGAN) 12.5 MG tablet Take 12.5 mg by mouth every 8 (eight) hours as needed.     rizatriptan (MAXALT-MLT) 10 MG disintegrating tablet Take 10 mg by mouth as needed for migraine. May repeat in 2 hours if needed     silodosin (RAPAFLO) 8 MG CAPS capsule Take 8 mg by mouth at bedtime.      tiZANidine (ZANAFLEX) 2 MG tablet Take 2 mg by mouth 3 (three) times daily as needed.     topiramate (TOPAMAX) 100 MG tablet Take 1 tablet (100 mg total) by mouth 2 (two) times daily. 60 tablet 5   Travoprost, BAK Free, (TRAVATAN) 0.004 % SOLN ophthalmic solution Place 1 drop into both eyes at bedtime.      No facility-administered medications prior to visit.    Review of Systems CNS: No confusion or sedation Cardiac: No angina or palpitations GI: No abdominal pain or constipation Constitutional: No nausea vomiting fevers or chills  Objective:  BP 131/83   Pulse 62   Temp (!) 97.2 F (36.2 C) (Temporal)   Resp 16   Ht 4\' 11"  (1.499 m)   Wt 130 lb (59 kg)   SpO2 100%   BMI 26.26 kg/m    BP Readings from Last 3 Encounters:  07/01/23 131/83  06/23/23 111/83  06/18/23 (!) 164/61     Wt Readings from Last 3 Encounters:  07/01/23 130 lb (59 kg)  06/23/23 125 lb (56.7 kg)  06/18/23 125 lb (56.7 kg)     Physical Exam Pt is alert and oriented PERRL EOMI HEART IS RRR no murmur or rub LCTA no wheezing or rales MUSCULOSKELETAL reveals some mild tenderness in the bilateral trapezius but less tenderness.  She has some soreness in the lumbar spine with no associated trigger points on examination.  She does have a positive straight leg raise on the right side.  She is tender in the anterior thigh distribution.  She has an antalgic gait.  Muscle tone and bulk is appropriate  Labs  No results found for:  "HGBA1C" Lab Results  Component Value Date   CREATININE 0.89 04/21/2023    -------------------------------------------------------------------------------------------------------------------- Lab Results  Component Value Date   WBC 8.4 06/18/2023   HGB 12.6 06/18/2023   HCT 40.7 06/18/2023   PLT 223 06/18/2023   GLUCOSE 207 (H) 04/21/2023   ALT 42 (H) 04/21/2023   AST 13 04/21/2023   NA 140 04/21/2023   K 3.6 04/21/2023   CL 103 04/21/2023   CREATININE 0.89 04/21/2023  BUN 14 04/21/2023   CO2 28 04/21/2023   TSH 1.31 12/25/2015   INR 1.03 04/19/2013    --------------------------------------------------------------------------------------------------------------------- No results found.   Assessment & Plan:   [name] was seen today for back pain.  Diagnoses and all orders for this visit:  Spinal stenosis of lumbar region with neurogenic claudication -     Lumbar Epidural Injection; Future  Occipital neuralgia of left side  Chronic bilateral low back pain without sciatica  SPONDYLOLITHESIS  Cervicalgia  Sciatica of right side -     Lumbar Epidural Injection; Future  Other orders -     traMADol (ULTRAM) 50 MG tablet; Take 1 tablet (50 mg total) by mouth 3 (three) times daily.        ----------------------------------------------------------------------------------------------------------------------  Problem List Items Addressed This Visit       Unprioritized   Chronic low back pain   Relevant Medications   traMADol (ULTRAM) 50 MG tablet   Sciatica of right side   Relevant Orders   Lumbar Epidural Injection   Spinal stenosis of lumbar region - Primary   Relevant Orders   Lumbar Epidural Injection   SPONDYLOLITHESIS   Other Visit Diagnoses       Occipital neuralgia of left side       Relevant Medications   traMADol (ULTRAM) 50 MG tablet     Cervicalgia              ----------------------------------------------------------------------------------------------------------------------  1. Spinal stenosis of lumbar region with neurogenic claudication (Primary) Will proceed with an epidural steroid injection to see if this can help with some of the sciatica symptoms she is having.  We had a long discussion regarding low back pain and response to therapy including interventional injection.  I feel she would be a good candidate for an epidural steroid injection we gone over the risks and benefits of this with her in full detail.  Will schedule this for the next few weeks.  Continue with stretching exercises as tolerated. - Lumbar Epidural Injection; Future  2. Occipital neuralgia of left side As above with stretching exercises to the glenohumeral joints and massage  3. Chronic bilateral low back pain without sciatica As above  4. SPONDYLOLITHESIS   5. Cervicalgia   6. Sciatica of right side As above - Lumbar Epidural Injection; Future    ----------------------------------------------------------------------------------------------------------------------  I am having Tammy Brood. Otten start on traMADol. I am also having her maintain her ALPRAZolam, acetaminophen, silodosin, topiramate, Travoprost (BAK Free), multivitamin with minerals, polyvinyl alcohol, lisinopril, hydrocortisone, loperamide, Dapagliflozin Propanediol (FARXIGA PO), dicyclomine, Erenumab-aooe (AIMOVIG [place]), Evolocumab (REPATHA [place]), linaclotide, Insulin Glargine-Lixisenatide (SOLIQUA [place]), gabapentin, rizatriptan, cyanocobalamin, cholecalciferol, Milnacipran HCl, DSS, ascorbic acid, cetirizine, fluticasone, polyethylene glycol powder, promethazine, tiZANidine, AMBULATORY NON FORMULARY MEDICATION, famotidine, and dexlansoprazole.   Meds ordered this encounter  Medications   traMADol (ULTRAM) 50 MG tablet    Sig: Take 1 tablet (50 mg total) by mouth 3 (three) times daily.     Dispense:  90 tablet    Refill:  2   Patient's Medications  New Prescriptions   TRAMADOL (ULTRAM) 50 MG TABLET    Take 1 tablet (50 mg total) by mouth 3 (three) times daily.  Previous Medications   ACETAMINOPHEN (TYLENOL) 500 MG TABLET    Take 500 mg by mouth every 6 (six) hours as needed for mild pain.    ALPRAZOLAM (XANAX XR) 1 MG 24 HR TABLET    Take 0.5-1 mg by mouth as needed for sleep (Anxiety).    AMBULATORY  NON FORMULARY MEDICATION    Valium (Diazepam) 10 mg vaginal suppository. Dispense #30; 2 refills. Insert 1 suppository into vagina nightly for pelvic floor muscle relaxation.   ASCORBIC ACID (VITAMIN C) 500 MG TABLET    Take 1,000 mg by mouth daily.   CETIRIZINE (ZYRTEC) 10 MG TABLET    Take 10 mg by mouth daily.   CHOLECALCIFEROL (VITAMIN D3) 10 MCG (400 UNIT) TABS TABLET    Take 5,000 Units by mouth daily.   CYANOCOBALAMIN (VITAMIN B12) 1000 MCG TABLET    Take 1,000 mcg by mouth daily.   DAPAGLIFLOZIN PROPANEDIOL (FARXIGA PO)    Take 10 mg by mouth daily at 6 (six) AM.   DEXLANSOPRAZOLE (DEXILANT) 60 MG CAPSULE    TAKE 1 CAPSULE BY MOUTH DAILY BEFORE BREAKFAST.   DICYCLOMINE (BENTYL) 10 MG CAPSULE    Take 1 capsule (10 mg total) by mouth every 12 (twelve) hours as needed for spasms (abdominal pain/discomfort).   DOCUSATE SODIUM (DSS) 100 MG CAPS    Take 100 mg by mouth daily as needed.   ERENUMAB-AOOE (AIMOVIG [place])    Inject into the skin every 30 (thirty) days.   EVOLOCUMAB (REPATHA [place])    Inject into the skin. Every two weeks.   FAMOTIDINE (PEPCID) 40 MG TABLET    Take one po 30-45 minutes prior to supper.   FLUTICASONE (FLONASE) 50 MCG/ACT NASAL SPRAY    Place 2 sprays into both nostrils daily.   GABAPENTIN (NEURONTIN) 100 MG CAPSULE    Take 2 capsules by mouth 2 (two) times daily.   HYDROCORTISONE (ANUSOL-HC) 2.5 % RECTAL CREAM    APPLY 1 APPLICATION RECTALLY TWICE DAILY.   INSULIN GLARGINE-LIXISENATIDE (SOLIQUA [place])    Inject 15 Units into the skin in the morning.    LINACLOTIDE (LINZESS) 72 MCG CAPSULE    Take 1 capsule (72 mcg total) by mouth daily before breakfast.   LISINOPRIL (ZESTRIL) 2.5 MG TABLET    Take 2.5 mg by mouth daily.   LOPERAMIDE (IMODIUM) 2 MG CAPSULE    Take 1 capsule (2 mg total) by mouth 2 (two) times daily as needed for diarrhea or loose stools.   MILNACIPRAN HCL (SAVELLA) 100 MG TABS TABLET    Take 100 mg by mouth 2 (two) times daily.   MULTIPLE VITAMIN (MULTIVITAMIN WITH MINERALS) TABS TABLET    Take 1 tablet by mouth daily.   POLYETHYLENE GLYCOL POWDER (GLYCOLAX/MIRALAX) 17 GM/SCOOP POWDER    Take 0.5 Containers by mouth daily as needed for mild constipation.   POLYVINYL ALCOHOL (LIQUIFILM TEARS) 1.4 % OPHTHALMIC SOLUTION    Place 1 drop into both eyes 3 (three) times daily as needed for dry eyes.   PROMETHAZINE (PHENERGAN) 12.5 MG TABLET    Take 12.5 mg by mouth every 8 (eight) hours as needed.   RIZATRIPTAN (MAXALT-MLT) 10 MG DISINTEGRATING TABLET    Take 10 mg by mouth as needed for migraine. May repeat in 2 hours if needed   SILODOSIN (RAPAFLO) 8 MG CAPS CAPSULE    Take 8 mg by mouth at bedtime.    TIZANIDINE (ZANAFLEX) 2 MG TABLET    Take 2 mg by mouth 3 (three) times daily as needed.   TOPIRAMATE (TOPAMAX) 100 MG TABLET    Take 1 tablet (100 mg total) by mouth 2 (two) times daily.   TRAVOPROST, BAK FREE, (TRAVATAN) 0.004 % SOLN OPHTHALMIC SOLUTION    Place 1 drop into both eyes at bedtime.   Modified Medications   No medications  on file  Discontinued Medications   No medications on file   ----------------------------------------------------------------------------------------------------------------------  Follow-up: Return in about 2 weeks (around 07/15/2023) for evaluation, procedure.    Yevette Edwards, MD

## 2023-07-01 NOTE — Patient Instructions (Signed)

## 2023-07-02 ENCOUNTER — Encounter (INDEPENDENT_AMBULATORY_CARE_PROVIDER_SITE_OTHER): Payer: Self-pay | Admitting: *Deleted

## 2023-07-09 ENCOUNTER — Other Ambulatory Visit: Payer: Medicare Other | Admitting: Urology

## 2023-07-15 ENCOUNTER — Encounter: Payer: Self-pay | Admitting: Anesthesiology

## 2023-07-15 ENCOUNTER — Ambulatory Visit: Payer: Medicare Other | Attending: Anesthesiology | Admitting: Anesthesiology

## 2023-07-15 ENCOUNTER — Ambulatory Visit: Admission: RE | Admit: 2023-07-15 | Payer: Medicare Other | Source: Ambulatory Visit

## 2023-07-15 ENCOUNTER — Other Ambulatory Visit: Payer: Self-pay | Admitting: Anesthesiology

## 2023-07-15 VITALS — BP 135/69 | HR 85 | Temp 97.8°F | Resp 16 | Ht 59.0 in | Wt 130.0 lb

## 2023-07-15 DIAGNOSIS — M5481 Occipital neuralgia: Secondary | ICD-10-CM

## 2023-07-15 DIAGNOSIS — Q762 Congenital spondylolisthesis: Secondary | ICD-10-CM | POA: Diagnosis not present

## 2023-07-15 DIAGNOSIS — M797 Fibromyalgia: Secondary | ICD-10-CM

## 2023-07-15 DIAGNOSIS — M961 Postlaminectomy syndrome, not elsewhere classified: Secondary | ICD-10-CM

## 2023-07-15 DIAGNOSIS — M5431 Sciatica, right side: Secondary | ICD-10-CM | POA: Diagnosis present

## 2023-07-15 DIAGNOSIS — M7918 Myalgia, other site: Secondary | ICD-10-CM

## 2023-07-15 DIAGNOSIS — M545 Low back pain, unspecified: Secondary | ICD-10-CM

## 2023-07-15 DIAGNOSIS — G8929 Other chronic pain: Secondary | ICD-10-CM

## 2023-07-15 DIAGNOSIS — M48062 Spinal stenosis, lumbar region with neurogenic claudication: Secondary | ICD-10-CM | POA: Diagnosis not present

## 2023-07-15 DIAGNOSIS — G894 Chronic pain syndrome: Secondary | ICD-10-CM | POA: Diagnosis present

## 2023-07-15 DIAGNOSIS — M542 Cervicalgia: Secondary | ICD-10-CM | POA: Diagnosis present

## 2023-07-15 MED ORDER — ROPIVACAINE HCL 2 MG/ML IJ SOLN
10.0000 mL | Freq: Once | INTRAMUSCULAR | Status: DC
  Filled 2023-07-15: qty 20

## 2023-07-15 MED ORDER — DEXAMETHASONE SODIUM PHOSPHATE 10 MG/ML IJ SOLN
10.0000 mg | Freq: Once | INTRAMUSCULAR | Status: DC
  Filled 2023-07-15: qty 1

## 2023-07-15 MED ORDER — HYDROCODONE-ACETAMINOPHEN 5-325 MG PO TABS: 1.0000 | ORAL_TABLET | Freq: Two times a day (BID) | ORAL | 0 refills | Status: DC

## 2023-07-15 NOTE — Progress Notes (Signed)
 Safety precautions to be maintained throughout the outpatient stay will include: orient to surroundings, keep bed in low position, maintain call bell within reach at all times, provide assistance with transfer out of bed and ambulation.

## 2023-07-16 ENCOUNTER — Telehealth: Payer: Self-pay

## 2023-07-16 NOTE — Telephone Encounter (Signed)
 Post procedue follow up. Unable to leave message due to mailbox being full.

## 2023-07-21 ENCOUNTER — Ambulatory Visit (INDEPENDENT_AMBULATORY_CARE_PROVIDER_SITE_OTHER): Payer: Medicare Other | Admitting: Gastroenterology

## 2023-07-23 ENCOUNTER — Encounter: Payer: Self-pay | Admitting: Urology

## 2023-07-23 ENCOUNTER — Ambulatory Visit: Payer: Medicare Other | Admitting: Urology

## 2023-07-23 VITALS — BP 161/87 | HR 79

## 2023-07-23 DIAGNOSIS — R39198 Other difficulties with micturition: Secondary | ICD-10-CM

## 2023-07-23 DIAGNOSIS — N398 Other specified disorders of urinary system: Secondary | ICD-10-CM

## 2023-07-23 DIAGNOSIS — R339 Retention of urine, unspecified: Secondary | ICD-10-CM

## 2023-07-23 DIAGNOSIS — M6289 Other specified disorders of muscle: Secondary | ICD-10-CM

## 2023-07-23 MED ORDER — CIPROFLOXACIN HCL 500 MG PO TABS
500.0000 mg | ORAL_TABLET | Freq: Once | ORAL | Status: AC
  Administered 2023-07-23: 500 mg via ORAL

## 2023-07-23 NOTE — Progress Notes (Unsigned)
Subjective:  1. Pelvic floor dysfunction   2. Voiding dysfunction   3. Incomplete bladder emptying     07/23/23: [name] returns today in f/u for cystoscopy for the recent CT findings of some bladder wall irregularity.  She tried the valium suppositories but didn't notice much benefit.  She remains on silodosin but is off of duloxetine.  She has been started on hydrocodone for her back.   11/20/22: [name] returns today in f/u for her history of incomplete emptying with dysfunctional voiding. She has hesitancy with the urge.   She reports progressive voiding difficulty and her PVR is .  She has no hematuria or dysuria.  She remains on Silodosin but is also still on Duloxetine.     08/21/22: [name] was being seen by Dr. Pete Glatter for incomplete bladder emptying and has been on silodosin and PT for pelvic floor dyssynergia in the past.  She had urodynamics in 7/23 with the results noted below.  Her UA is clear today and her PVR is .  She has variable symptoms and can have small voids.  She is on duloxetine.   01/15/22: [name] is a 82 y.o. year old female who is seen for further evaluation of urinary symptoms.  She is followed by Dr.Bhutani for chronic kidney disease.  Her renal function is stable with a creatinine of 1.04 on 11/14/21.  She reported difficulty initiating bladder emptying at her recent visit.  She has been followed by Dr. Sherron Monday at [facility] with her last visit in December 2022.  She has been managed for her bladder symptoms with Rapaflo and physical therapy for pelvic floor dyssynergia.  She was restarted on her Rapaflo at her last visit in 12/22.   She reported continued symptoms of urinary hesitancy, straining, and sensation of incomplete emptying.  She voids 2- 4 times/day.  She continued on Rapaflo. Pelvic exam demonstrated a grade 2 cystocele.  I&O cath  showed a volume of 200 mL. Urodynamics from 01/03/2022 demonstrated a max capacity of 633 mL with normal  sensation.  No evidence of stress incontinence or instability.  She was able to generate a voluntary contraction and void 316 mL with a max flow rate of 16 mL/s.  EMG leads required during voiding.  PVR was 316 mL.  No significant descent of the bladder was     ROS:  ROS:  A complete review of systems was performed.  All systems are negative except for pertinent findings as noted.   Review of Systems  Constitutional:  Positive for malaise/fatigue.  HENT:  Positive for congestion.   Respiratory:  Positive for cough.   Gastrointestinal:  Positive for constipation, diarrhea and heartburn.  Musculoskeletal:  Positive for back pain and joint pain.  Neurological:  Positive for dizziness, weakness and headaches.  Endo/Heme/Allergies:  Positive for polydipsia. Bruises/bleeds easily.  Psychiatric/Behavioral:  Positive for depression and memory loss. The patient is nervous/anxious.     Allergies  Allergen Reactions   Botox [Onabotulinumtoxina]     Patient states that it became loose in her stomach , and it caused multiple problems.   Morphine Anaphylaxis and Other (See Comments)    "it will kill me" "made me stop breathing"   Aspirin Other (See Comments)    Gi bleed    Cefuroxime Axetil Nausea And Vomiting   Codeine Nausea And Vomiting   Diltiazem Nausea And Vomiting   Dronabinol Nausea And Vomiting and Palpitations   Shellfish Allergy Other (See Comments)    Blood  sugar drops   Other     Mold and trees   Ondansetron Hives   Penicillins Rash    Did it involve swelling of the face/tongue/throat, SOB, or low BP? No Did it involve sudden or severe rash/hives, skin peeling, or any reaction on the inside of your mouth or nose? Yes Did you need to seek medical attention at a hospital or doctor's office? Yes When did it last happen?  Over 10 years  If all above answers are "NO", may proceed with cephalosporin use.     Outpatient Encounter Medications as of 07/23/2023  Medication Sig Note    acetaminophen (TYLENOL) 500 MG tablet Take 500 mg by mouth every 6 (six) hours as needed for mild pain.     ALPRAZolam (XANAX XR) 1 MG 24 hr tablet Take 0.5-1 mg by mouth as needed for sleep (Anxiety).     AMBULATORY NON FORMULARY MEDICATION Valium (Diazepam) 10 mg vaginal suppository. Dispense #30; 2 refills. Insert 1 suppository into vagina nightly for pelvic floor muscle relaxation.    ascorbic acid (VITAMIN C) 500 MG tablet Take 1,000 mg by mouth daily.    cetirizine (ZYRTEC) 10 MG tablet Take 10 mg by mouth daily.    cholecalciferol (VITAMIN D3) 10 MCG (400 UNIT) TABS tablet Take 5,000 Units by mouth daily.    cyanocobalamin (VITAMIN B12) 1000 MCG tablet Take 1,000 mcg by mouth daily.    Dapagliflozin Propanediol (FARXIGA PO) Take 10 mg by mouth daily at 6 (six) AM.    dexlansoprazole (DEXILANT) 60 MG capsule TAKE 1 CAPSULE BY MOUTH DAILY BEFORE BREAKFAST.    dicyclomine (BENTYL) 10 MG capsule Take 1 capsule (10 mg total) by mouth every 12 (twelve) hours as needed for spasms (abdominal pain/discomfort).    Docusate Sodium (DSS) 100 MG CAPS Take 100 mg by mouth daily as needed.    Erenumab-aooe (AIMOVIG Seltzer) Inject into the skin every 30 (thirty) days.    Evolocumab (REPATHA [place]) Inject into the skin. Every two weeks.    famotidine (PEPCID) 40 MG tablet Take one po 30-45 minutes prior to supper.    fluticasone (FLONASE) 50 MCG/ACT nasal spray Place 2 sprays into both nostrils daily.    gabapentin (NEURONTIN) 100 MG capsule Take 2 capsules by mouth 2 (two) times daily.    HYDROcodone-acetaminophen (NORCO/VICODIN) 5-325 MG tablet Take 1 tablet by mouth 2 (two) times daily.    hydrocortisone (ANUSOL-HC) 2.5 % rectal cream APPLY 1 APPLICATION RECTALLY TWICE DAILY.    Insulin Glargine-Lixisenatide (SOLIQUA [place]) Inject 15 Units into the skin in the morning.    linaclotide (LINZESS) 72 MCG capsule Take 1 capsule (72 mcg total) by mouth daily before breakfast. 04/21/2023: As needed    lisinopril  (ZESTRIL) 2.5 MG tablet Take 2.5 mg by mouth daily.    loperamide (IMODIUM) 2 MG capsule Take 1 capsule (2 mg total) by mouth 2 (two) times daily as needed for diarrhea or loose stools.    Milnacipran HCl (SAVELLA) 100 MG TABS tablet Take 100 mg by mouth 2 (two) times daily.    Multiple Vitamin (MULTIVITAMIN WITH MINERALS) TABS tablet Take 1 tablet by mouth daily.    polyethylene glycol powder (GLYCOLAX/MIRALAX) 17 GM/SCOOP powder Take 0.5 Containers by mouth daily as needed for mild constipation.    polyvinyl alcohol (LIQUIFILM TEARS) 1.4 % ophthalmic solution Place 1 drop into both eyes 3 (three) times daily as needed for dry eyes.    promethazine (PHENERGAN) 12.5 MG tablet Take 12.5 mg by mouth  every 8 (eight) hours as needed.    rizatriptan (MAXALT-MLT) 10 MG disintegrating tablet Take 10 mg by mouth as needed for migraine. May repeat in 2 hours if needed    silodosin (RAPAFLO) 8 MG CAPS capsule Take 8 mg by mouth at bedtime.     tiZANidine (ZANAFLEX) 2 MG tablet Take 2 mg by mouth 3 (three) times daily as needed.    topiramate (TOPAMAX) 100 MG tablet Take 1 tablet (100 mg total) by mouth 2 (two) times daily.    Travoprost, BAK Free, (TRAVATAN) 0.004 % SOLN ophthalmic solution Place 1 drop into both eyes at bedtime.     Facility-Administered Encounter Medications as of 07/23/2023  Medication   dexamethasone (DECADRON) injection 10 mg   ropivacaine (PF) 2 mg/mL (0.2%) (NAROPIN) injection 10 mL    Past Medical History:  Diagnosis Date   Anemia    Anxiety    Arthritis    "all over" (04/19/2013)   Asthma    Bleeding stomach ulcer 02/08/1979   Bloating    Chronic back pain    Chronic headaches    Chronic heartburn    Chronic neck pain    Chronic pain    Colon cancer (HCC)    DDD (degenerative disc disease), lumbar    Diabetic peripheral neuropathy (HCC)    "in my feet" (04/19/2013)   Dizziness    Dysphagia    Family history of anesthesia complication    "daughter has PONV too"  (04/19/2013)   Fibromyalgia    Gastroesophageal reflux    Gastroparesis    Glaucoma, bilateral    Hiatal hernia    "had it before; had OR; got it again" (04/19/2013)   History of blood transfusion 02/08/1979   "w/bleeding stomach ulcer" (04/19/2013)   History of kidney stones    Migraine    "take RX for it qd" (04/19/2013)   Nausea    Pneumonia 04/09/2012   PONV (postoperative nausea and vomiting)    Renal insufficiency    TIA (transient ischemic attack)    "3-4 before starting RX; none since" (04/19/2013)   Type II diabetes mellitus (HCC)    Walking pneumonia ~ 1966    Past Surgical History:  Procedure Laterality Date   ABDOMINAL ADHESION SURGERY  ~ 2012   "repaired wrap where they did hiatal hernia OR too" 911/04/2013)   ANTERIOR CERVICAL DISCECTOMY  ~ 2009   "only cleaned out arthritis and spurs" (04/19/2013)   BIOPSY  01/09/2021   Procedure: BIOPSY;  Surgeon: Malissa Hippo, MD;  Location: [facility];  Service: Endoscopy;;  antrum; distal esophagus;   CATARACT EXTRACTION W/ INTRAOCULAR LENS IMPLANT Left 04/13/2013   CHOLECYSTECTOMY  1980's   COLON SURGERY     COLONOSCOPY  10/11/2010   COLONOSCOPY  11/23/2009   COLONOSCOPY  07/28/2008   W/SNARE   COLONOSCOPY  06/28/07   COLONOSCOPY  05/10/07   W/POLYP   COLONOSCOPY  12/28/00   COLONOSCOPY N/A 06/29/2015   Procedure: COLONOSCOPY;  Surgeon: Malissa Hippo, MD;  Location: [facility];  Service: Endoscopy;  Laterality: N/A;  135   COLONOSCOPY WITH ESOPHAGOGASTRODUODENOSCOPY (EGD) N/A 05/13/2013   Procedure: COLONOSCOPY WITH ESOPHAGOGASTRODUODENOSCOPY (EGD);  Surgeon: Malissa Hippo, MD;  Location: [facility];  Service: Endoscopy;  Laterality: N/A;  855   COLONOSCOPY WITH PROPOFOL N/A 01/09/2021   Procedure: COLONOSCOPY WITH PROPOFOL;  Surgeon: Malissa Hippo, MD;  Location: [facility];  Service: Endoscopy;  Laterality: N/A;  12:50   ESOPHAGOGASTRODUODENOSCOPY (  EGD) WITH PROPOFOL N/A 10/06/2018   Procedure:  ESOPHAGOGASTRODUODENOSCOPY (EGD) WITH PROPOFOL;  Surgeon: Malissa Hippo, MD;  [facility]: [facility];  Service: Endoscopy;  Laterality: N/A;   ESOPHAGOGASTRODUODENOSCOPY (EGD) WITH PROPOFOL N/A 01/09/2021   Procedure: ESOPHAGOGASTRODUODENOSCOPY (EGD) WITH PROPOFOL;  Surgeon: Malissa Hippo, MD;  [facility]: [facility];  Service: Endoscopy;  Laterality: N/A;   FLEXIBLE SIGMOIDOSCOPY N/A 06/23/2023   Procedure: FLEXIBLE SIGMOIDOSCOPY;  Surgeon: Dolores Frame, MD;  Location: [facility];  Service: Gastroenterology;  Laterality: N/A;  9:30AM;ASA 3   HEMICOLECTOMY  2010   ZIEGLER   HIATAL HERNIA REPAIR  1970's   LEFT HEART CATHETERIZATION WITH CORONARY ANGIOGRAM N/A 04/19/2013   Procedure: LEFT HEART CATHETERIZATION WITH CORONARY ANGIOGRAM;  Surgeon: Peter M Swaziland, MD;  Location: [facility];  Service: Cardiovascular;  Laterality: N/A;   POLYPECTOMY  10/06/2018   Procedure: POLYPECTOMY;  Surgeon: Malissa Hippo, MD;  [facility]: [facility];  Service: Endoscopy;;  gastric   POLYPECTOMY  01/09/2021   Procedure: POLYPECTOMY;  Surgeon: Malissa Hippo, MD;  [facility]: [facility];  Service: Endoscopy;;  transverse;splenic flexure   POLYPECTOMY  06/23/2023   Procedure: POLYPECTOMY INTESTINAL;  Surgeon: Dolores Frame, MD;  Location: [facility];  Service: Gastroenterology;;   POSTERIOR FUSION CERVICAL SPINE  1985   "had a broken neck" (04/19/2013)   TONSILLECTOMY  ~ 1953   TOOTH EXTRACTION Bilateral 12/04/2020   Procedure: BILATERAL TEMPOROMANDIBULAR JOINT ARTHROCENTESIS; DENTAL EXTRACTION TEETH #3,6,7,8,9,10,11,12,19,23,24,25,26,32 WITH ALVEOLOPLASTY;  Surgeon: Ocie Doyne, DMD;  Location: [facility];  Service: Oral Surgery;  Laterality: Bilateral;   UPPER GASTROINTESTINAL ENDOSCOPY  10/11/2010   EGD ED   UPPER GASTROINTESTINAL ENDOSCOPY  11/23/2009   UPPER GASTROINTESTINAL ENDOSCOPY  05/10/07   EGD ED   UPPER GASTROINTESTINAL ENDOSCOPY  08/11/01   EGD ED   VAGINAL  HYSTERECTOMY      Social History   Socioeconomic History   Marital status: Widowed    Spouse name: Not on file   Number of children: Not on file   Years of education: Not on file   Highest education level: Not on file  Occupational History    Employer: RETIRED  Tobacco Use   Smoking status: Former    Current packs/day: 0.00    Average packs/day: 1.5 packs/day for 15.0 years (22.5 ttl pk-yrs)    Types: Cigarettes    Start date: 03/16/1976    Quit date: 03/17/1991    Years since quitting: 32.3    Passive exposure: Past   Smokeless tobacco: Never   Tobacco comments:    Patient states that it has ben greater than 20 years since she quit smoking  Vaping Use   Vaping status: Never Used  Substance and Sexual Activity   Alcohol use: No    Alcohol/week: 0.0 standard drinks of alcohol   Drug use: No   Sexual activity: Not Currently  Other Topics Concern   Not on file  Social History Narrative   Patient lives at home alone.    Patient is retired.    Patient is widowed.    Patient has 4 children.   Patient has a 11th grade education.          Social Drivers of Corporate investment banker Strain: Not on file  Food Insecurity: Not on file  Transportation Needs: Not on file  Physical Activity: Not on file  Stress: Not on file  Social Connections: Not on file  Intimate Partner Violence: Not  on file    Family History  Problem Relation Age of Onset   Heart disease Mother    Diabetes Mother    Dementia Father    Healthy Sister    Diabetes Brother    Kidney cancer Brother    Diabetes Brother    Neuropathy Brother    Diabetes Daughter    Diabetes Daughter    Diabetes Son        Objective: Vitals:   07/23/23 0926  BP: (!) 161/87  Pulse: 79     Physical Exam Vitals reviewed.  Constitutional:      Appearance: Normal appearance.  Neurological:     Mental Status: She is alert.     Lab Results:  PSA No results found for: "PSA" No results found for:  "TESTOSTERONE"   Studies/Results: No results found. CT ABDOMEN PELVIS W CONTRAST Result Date: 05/10/2023 CLINICAL DATA:  Abdominal pain, acute, nonlocalized upper abdominal pain, nausea, history of cholecystectomy in the past EXAM: CT ABDOMEN AND PELVIS WITH CONTRAST TECHNIQUE: Multidetector CT imaging of the abdomen and pelvis was performed using the standard protocol following bolus administration of intravenous contrast. RADIATION DOSE REDUCTION: This exam was performed according to the departmental dose-optimization program which includes automated exposure control, adjustment of the mA and/or kV according to patient size and/or use of iterative reconstruction technique. CONTRAST:  OMNIPAQUE IOHEXOL 300 MG/ML  SOLN COMPARISON:  CT abdomen pelvis 07/04/2016 FINDINGS: Lower chest: Right middle lobe 5 mm ground-glass pulmonary nodule. At least small volume mixed sliding and paraesophageal hiatal hernia. Hepatobiliary: No focal liver abnormality. Status post cholecystectomy. No biliary dilatation. Pancreas: No focal lesion. Normal pancreatic contour. No surrounding inflammatory changes. No main pancreatic ductal dilatation. Spleen: Normal in size without focal abnormality. Adrenals/Urinary Tract: Stable in size heterogeneous 3.3 x 2.5 cm left adrenal gland nodule with associated fatty density-consistent with a adrenal myelolipoma-no further follow-up indicated. No right adrenal gland nodule. Bilateral kidneys enhance symmetrically. Slightly more prominent than prior right external renal pelvis with associated mild right proximal hydroureter. Similar-appearing left extrarenal pelvis. No left hydroureter. No hydronephrosis bilaterally. Focal 2 mm left nephrolithiasis. No right nephrolithiasis. No ureterolithiasis bilaterally. The urinary bladder is distended with urine with irregular urinary bladder wall thickening. Right urinary bladder diverticula. On delayed imaging, there is no urothelial wall  thickening and there are no filling defects in the opacified portions of the bilateral collecting systems or ureters. Stomach/Bowel: Partial colectomy. Stomach is within normal limits. No evidence of small bowel wall thickening or dilatation. Rectal wall thickening (5:66) in the setting of under distension. Colonic diverticulosis. Appendix appears normal. Vascular/Lymphatic: No abdominal aorta or iliac aneurysm. Severe atherosclerotic plaque of the aorta and its branches. No abdominal, pelvic, or inguinal lymphadenopathy. Reproductive: Status post hysterectomy. No adnexal masses. Other: No intraperitoneal free fluid. No intraperitoneal free gas. No organized fluid collection. Musculoskeletal: No abdominal wall hernia or abnormality. No suspicious lytic or blastic osseous lesions. No acute displaced fracture. Multilevel degenerative changes of the spine. Grade 2 anterolisthesis of L5 on S1. Osseous fusion of the L5-S1 levels. Bilateral L5 pars interarticularis defects. Mild retrolisthesis of T11 on T12, T12 on L1, L1 on L2. IMPRESSION: 1. Right middle lobe 5 mm ground-glass pulmonary nodule. No follow-up recommended. This recommendation follows the consensus statement: Guidelines for Management of Incidental Pulmonary Nodules Detected on CT Images: From the Fleischner Society 2017; Radiology 2017; 284:228-243. 2. Irregular urinary bladder wall thickening. Correlate urinalysis for infection. Recommend urologic consultation given irregularity and underlying malignancy not excluded.  3. Mild proximal right hydroureter and slightly more prominent right extrarenal pelvis. No hydronephrosis. No right nephroureterolithiasis. This may reflect the residual of a recently passed calculus or reflect chronic changes of obstructive uropathy or reflux. 4. Nonobstructive left nephrolithiasis-measuring up 2 mm. 5. At least small volume mixed sliding and paraesophageal hiatal hernia. 6. Rectal wall thickening likely due to under  distension. No definite findings of colitis. If clinically indicated, consider colonoscopy for further evaluation. 7. Severe degenerative changes of grade 2 anterolisthesis of L5 on S1. 8.  Aortic Atherosclerosis (ICD10-I70.0). Electronically Signed   By: Tish Frederickson M.D.   On: 05/10/2023 22:31    Procedure: Cystoscopy  She was prepped with betadine and given Cipro 500mg  po.   The urethra was normal.  The bladder wall had moderate trabeculation with small diverticuli.  There were no mucosal lesions but there was some flocculant debris.  The UO's were normal.      Assessment & Plan: Dysfunctional voiding with hesitancy.   Her bladder wall is trabeculated but without lesions on cystoscopy.   She didn't do well with the vaginal suppositories.  She is off of duloxetine but remains on silodosin.     No orders of the defined types were placed in this encounter.    Orders Placed This Encounter  Procedures   Urinalysis, Routine w reflex microscopic   Cystoscopy      Return in about 3 months (around 10/20/2023) for with UA and PVR. Marland Kitchen   CC: Armando Gang, FNP      Bjorn Pippin 07/23/2023

## 2023-07-27 NOTE — Progress Notes (Signed)
 Subjective:  Patient ID: [name], female    DOB: 10/12/1940  Age: 83 y.o. MRN: [id number]  CC: Back Pain (low)   Procedure: None  HPI [name] presents for reevaluation.  She reports that her cervical pain and the area over the greater septal region with her posterior headaches has been fairly severe.  She is still getting some cervical tension headaches.  In the past she has had bilateral trapezius injections and these have helped significantly with reduction in the trapezius pain she is experienced.  She still taking her medications for pain relief and these helped but these headaches have been impactful.  Despite heat or ice application stretching exercises or massage the posterior tension headaches persist.  No blacking out passing out spells are noted.  Historically she has been on tramadol  for pain relief but this is no longer very effective for her cervical facet type pain and she reports that she cannot get any significant relief.  Furthermore, she denies any vascular component or change in mental status or confusion with these headaches or with her medication.  Outpatient Medications Prior to Visit  Medication Sig Dispense Refill   acetaminophen  (TYLENOL ) 500 MG tablet Take 500 mg by mouth every 6 (six) hours as needed for mild pain.      ALPRAZolam  (XANAX  XR) 1 MG 24 hr tablet Take 0.5-1 mg by mouth as needed for sleep (Anxiety).      AMBULATORY NON FORMULARY MEDICATION Valium (Diazepam) 10 mg vaginal suppository. Dispense #30; 2 refills. Insert 1 suppository into vagina nightly for pelvic floor muscle relaxation. 30 each 2   ascorbic acid (VITAMIN C) 500 MG tablet Take 1,000 mg by mouth daily.     cetirizine (ZYRTEC) 10 MG tablet Take 10 mg by mouth daily.     cholecalciferol (VITAMIN D3) 10 MCG (400 UNIT) TABS tablet Take 5,000 Units by mouth daily.     cyanocobalamin  (VITAMIN B12) 1000 MCG tablet Take 1,000 mcg by mouth daily.     Dapagliflozin Propanediol (FARXIGA  PO) Take 10 mg by mouth daily at 6 (six) AM.     dexlansoprazole  (DEXILANT ) 60 MG capsule TAKE 1 CAPSULE BY MOUTH DAILY BEFORE BREAKFAST. 90 capsule 0   dicyclomine  (BENTYL ) 10 MG capsule Take 1 capsule (10 mg total) by mouth every 12 (twelve) hours as needed for spasms (abdominal pain/discomfort). 60 capsule 2   Docusate Sodium  (DSS) 100 MG CAPS Take 100 mg by mouth daily as needed.     Erenumab-aooe (AIMOVIG [place]) Inject into the skin every 30 (thirty) days.     Evolocumab (REPATHA [place]) Inject into the skin. Every two weeks.     famotidine  (PEPCID ) 40 MG tablet Take one po 30-45 minutes prior to supper. 90 tablet 3   fluticasone  (FLONASE ) 50 MCG/ACT nasal spray Place 2 sprays into both nostrils daily.     gabapentin (NEURONTIN) 100 MG capsule Take 2 capsules by mouth 2 (two) times daily.     hydrocortisone  (ANUSOL -HC) 2.5 % rectal cream APPLY 1 APPLICATION RECTALLY TWICE DAILY. 30 g 1   Insulin  Glargine-Lixisenatide (SOLIQUA [place]) Inject 15 Units into the skin in the morning.     linaclotide  (LINZESS ) 72 MCG capsule Take 1 capsule (72 mcg total) by mouth daily before breakfast. 90 capsule 3   lisinopril  (ZESTRIL ) 2.5 MG tablet Take 2.5 mg by mouth daily.     loperamide  (IMODIUM ) 2 MG capsule Take 1 capsule (2 mg total) by mouth 2 (two) times daily as needed  for diarrhea or loose stools.     Milnacipran HCl (SAVELLA) 100 MG TABS tablet Take 100 mg by mouth 2 (two) times daily.     Multiple Vitamin (MULTIVITAMIN WITH MINERALS) TABS tablet Take 1 tablet by mouth daily.     polyethylene glycol powder (GLYCOLAX /MIRALAX ) 17 GM/SCOOP powder Take 0.5 Containers by mouth daily as needed for mild constipation.     polyvinyl alcohol (LIQUIFILM TEARS) 1.4 % ophthalmic solution Place 1 drop into both eyes 3 (three) times daily as needed for dry eyes.     promethazine  (PHENERGAN ) 12.5 MG tablet Take 12.5 mg by mouth every 8 (eight) hours as needed.     rizatriptan (MAXALT-MLT) 10 MG disintegrating tablet Take 10  mg by mouth as needed for migraine. May repeat in 2 hours if needed     silodosin (RAPAFLO) 8 MG CAPS capsule Take 8 mg by mouth at bedtime.      tiZANidine (ZANAFLEX) 2 MG tablet Take 2 mg by mouth 3 (three) times daily as needed.     topiramate  (TOPAMAX ) 100 MG tablet Take 1 tablet (100 mg total) by mouth 2 (two) times daily. 60 tablet 5   Travoprost, BAK Free, (TRAVATAN) 0.004 % SOLN ophthalmic solution Place 1 drop into both eyes at bedtime.      traMADol  (ULTRAM ) 50 MG tablet Take 1 tablet (50 mg total) by mouth 3 (three) times daily. 90 tablet 2   No facility-administered medications prior to visit.    Review of Systems CNS: No confusion or sedation Cardiac: No angina or palpitations GI: No abdominal pain or constipation Constitutional: No nausea vomiting fevers or chills  Objective:  BP 135/69   Pulse 85   Temp 97.8 F (36.6 C)   Resp 16   Ht 4' 11 (1.499 m)   Wt 130 lb (59 kg)   SpO2 93%   BMI 26.26 kg/m    BP Readings from Last 3 Encounters:  07/23/23 (!) 161/87  07/15/23 135/69  07/01/23 131/83     Wt Readings from Last 3 Encounters:  07/15/23 130 lb (59 kg)  07/01/23 130 lb (59 kg)  06/23/23 125 lb (56.7 kg)     Physical Exam Pt is alert and oriented PERRL EOMI HEART IS RRR no murmur or rub LCTA no wheezing or rales MUSCULOSKELETAL reveals tenderness over the greater occipital nerve bilaterally with compression in this area reproducing her primary pain complaint yielding radiation of pain up over the posterior occipital region and into the neck.  She has pain on extension of the atlantooccipital joint reproducing her primary pain complaint.  Strength in the upper extremities appears to be well-preserved.  Labs  No results found for: HGBA1C Lab Results  Component Value Date   CREATININE 0.89 04/21/2023    -------------------------------------------------------------------------------------------------------------------- Lab Results  Component  Value Date   WBC 8.4 06/18/2023   HGB 12.6 06/18/2023   HCT 40.7 06/18/2023   PLT 223 06/18/2023   GLUCOSE 207 (H) 04/21/2023   ALT 42 (H) 04/21/2023   AST 13 04/21/2023   NA 140 04/21/2023   K 3.6 04/21/2023   CL 103 04/21/2023   CREATININE 0.89 04/21/2023   BUN 14 04/21/2023   CO2 28 04/21/2023   TSH 1.31 12/25/2015   INR 1.03 04/19/2013    --------------------------------------------------------------------------------------------------------------------- No results found.   Assessment & Plan:   [name] was seen today for back pain.  Diagnoses and all orders for this visit:  Spinal stenosis of lumbar region with neurogenic claudication  Occipital neuralgia of left side  Chronic bilateral low back pain without sciatica  SPONDYLOLITHESIS  Cervicalgia  Sciatica of right side  Fibromyalgia  Cervical post-laminectomy syndrome  Chronic pain syndrome  Cervical myofascial pain syndrome  Other orders -     dexamethasone  (DECADRON ) injection 10 mg -     ropivacaine  (PF) 2 mg/mL (0.2%) (NAROPIN ) injection 10 mL -     HYDROcodone -acetaminophen  (NORCO/VICODIN) 5-325 MG tablet; Take 1 tablet by mouth 2 (two) times daily.        ----------------------------------------------------------------------------------------------------------------------  Problem List Items Addressed This Visit       Unprioritized   Cervical post-laminectomy syndrome   Chronic low back pain   Relevant Medications   dexamethasone  (DECADRON ) injection 10 mg   ropivacaine  (PF) 2 mg/mL (0.2%) (NAROPIN ) injection 10 mL   HYDROcodone -acetaminophen  (NORCO/VICODIN) 5-325 MG tablet   Chronic pain syndrome   Relevant Medications   dexamethasone  (DECADRON ) injection 10 mg   ropivacaine  (PF) 2 mg/mL (0.2%) (NAROPIN ) injection 10 mL   HYDROcodone -acetaminophen  (NORCO/VICODIN) 5-325 MG tablet   Fibromyalgia   Relevant Medications   dexamethasone  (DECADRON ) injection 10 mg   ropivacaine  (PF) 2  mg/mL (0.2%) (NAROPIN ) injection 10 mL   HYDROcodone -acetaminophen  (NORCO/VICODIN) 5-325 MG tablet   Sciatica of right side   Spinal stenosis of lumbar region - Primary   SPONDYLOLITHESIS   Other Visit Diagnoses       Occipital neuralgia of left side       Relevant Medications   ropivacaine  (PF) 2 mg/mL (0.2%) (NAROPIN ) injection 10 mL   HYDROcodone -acetaminophen  (NORCO/VICODIN) 5-325 MG tablet     Cervicalgia         Cervical myofascial pain syndrome       Relevant Medications   dexamethasone  (DECADRON ) injection 10 mg   ropivacaine  (PF) 2 mg/mL (0.2%) (NAROPIN ) injection 10 mL   HYDROcodone -acetaminophen  (NORCO/VICODIN) 5-325 MG tablet         ----------------------------------------------------------------------------------------------------------------------  1. Spinal stenosis of lumbar region with neurogenic claudication (Primary) Continue core stretching strengthening  2. Occipital neuralgia of left side Schedule her for greater occipital nerve block laterally.  We have gone over the risks and benefits of the procedure with her in full detail.  3. Chronic bilateral low back pain without sciatica As above  4. SPONDYLOLITHESIS   5. Cervicalgia As above  6. Sciatica of right side   7. Fibromyalgia Going to start her on hydrocodone  1 tablet once or twice a day to see if this can be more effective for her than the tramadol .  She is instructed to discontinue the tramadol .  She understands the risk benefits of chronic opioid therapy.  8. Cervical post-laminectomy syndrome   9. Chronic pain syndrome As above and I have reviewed the [place]  practitioner database information as appropriate.  10. Cervical myofascial pain syndrome     ----------------------------------------------------------------------------------------------------------------------  I have discontinued Tammy A. Disanti's traMADol . I am also having her start on  HYDROcodone -acetaminophen . Additionally, I am having her maintain her ALPRAZolam , acetaminophen , silodosin, topiramate , Travoprost (BAK Free), multivitamin with minerals, polyvinyl alcohol, lisinopril , hydrocortisone , loperamide , Dapagliflozin Propanediol (FARXIGA PO), dicyclomine , Erenumab-aooe (AIMOVIG [place]), Evolocumab (REPATHA [place]), linaclotide , Insulin  Glargine-Lixisenatide (SOLIQUA [place]), gabapentin, rizatriptan, cyanocobalamin , cholecalciferol, Milnacipran HCl, DSS, ascorbic acid, cetirizine, fluticasone , polyethylene glycol powder, promethazine , tiZANidine, AMBULATORY NON FORMULARY MEDICATION, famotidine , and dexlansoprazole .   Meds ordered this encounter  Medications   dexamethasone  (DECADRON ) injection 10 mg   ropivacaine  (PF) 2 mg/mL (0.2%) (NAROPIN ) injection 10 mL   HYDROcodone -acetaminophen  (NORCO/VICODIN) 5-325 MG tablet  Sig: Take 1 tablet by mouth 2 (two) times daily.    Dispense:  45 tablet    Refill:  0   Patient's Medications  New Prescriptions   HYDROCODONE -ACETAMINOPHEN  (NORCO/VICODIN) 5-325 MG TABLET    Take 1 tablet by mouth 2 (two) times daily.  Previous Medications   ACETAMINOPHEN  (TYLENOL ) 500 MG TABLET    Take 500 mg by mouth every 6 (six) hours as needed for mild pain.    ALPRAZOLAM  (XANAX  XR) 1 MG 24 HR TABLET    Take 0.5-1 mg by mouth as needed for sleep (Anxiety).    AMBULATORY NON FORMULARY MEDICATION    Valium (Diazepam) 10 mg vaginal suppository. Dispense #30; 2 refills. Insert 1 suppository into vagina nightly for pelvic floor muscle relaxation.   ASCORBIC ACID (VITAMIN C) 500 MG TABLET    Take 1,000 mg by mouth daily.   CETIRIZINE (ZYRTEC) 10 MG TABLET    Take 10 mg by mouth daily.   CHOLECALCIFEROL (VITAMIN D3) 10 MCG (400 UNIT) TABS TABLET    Take 5,000 Units by mouth daily.   CYANOCOBALAMIN  (VITAMIN B12) 1000 MCG TABLET    Take 1,000 mcg by mouth daily.   DAPAGLIFLOZIN PROPANEDIOL (FARXIGA PO)    Take 10 mg by mouth daily at 6 (six) AM.   DEXLANSOPRAZOLE   (DEXILANT ) 60 MG CAPSULE    TAKE 1 CAPSULE BY MOUTH DAILY BEFORE BREAKFAST.   DICYCLOMINE  (BENTYL ) 10 MG CAPSULE    Take 1 capsule (10 mg total) by mouth every 12 (twelve) hours as needed for spasms (abdominal pain/discomfort).   DOCUSATE SODIUM  (DSS) 100 MG CAPS    Take 100 mg by mouth daily as needed.   ERENUMAB-AOOE (AIMOVIG [place])    Inject into the skin every 30 (thirty) days.   EVOLOCUMAB (REPATHA [place])    Inject into the skin. Every two weeks.   FAMOTIDINE  (PEPCID ) 40 MG TABLET    Take one po 30-45 minutes prior to supper.   FLUTICASONE  (FLONASE ) 50 MCG/ACT NASAL SPRAY    Place 2 sprays into both nostrils daily.   GABAPENTIN (NEURONTIN) 100 MG CAPSULE    Take 2 capsules by mouth 2 (two) times daily.   HYDROCORTISONE  (ANUSOL -HC) 2.5 % RECTAL CREAM    APPLY 1 APPLICATION RECTALLY TWICE DAILY.   INSULIN  GLARGINE-LIXISENATIDE (SOLIQUA )    Inject 15 Units into the skin in the morning.   LINACLOTIDE  (LINZESS ) 72 MCG CAPSULE    Take 1 capsule (72 mcg total) by mouth daily before breakfast.   LISINOPRIL  (ZESTRIL ) 2.5 MG TABLET    Take 2.5 mg by mouth daily.   LOPERAMIDE  (IMODIUM ) 2 MG CAPSULE    Take 1 capsule (2 mg total) by mouth 2 (two) times daily as needed for diarrhea or loose stools.   MILNACIPRAN HCL (SAVELLA) 100 MG TABS TABLET    Take 100 mg by mouth 2 (two) times daily.   MULTIPLE VITAMIN (MULTIVITAMIN WITH MINERALS) TABS TABLET    Take 1 tablet by mouth daily.   POLYETHYLENE GLYCOL POWDER (GLYCOLAX /MIRALAX ) 17 GM/SCOOP POWDER    Take 0.5 Containers by mouth daily as needed for mild constipation.   POLYVINYL ALCOHOL (LIQUIFILM TEARS) 1.4 % OPHTHALMIC SOLUTION    Place 1 drop into both eyes 3 (three) times daily as needed for dry eyes.   PROMETHAZINE  (PHENERGAN ) 12.5 MG TABLET    Take 12.5 mg by mouth every 8 (eight) hours as needed.   RIZATRIPTAN (MAXALT-MLT) 10 MG DISINTEGRATING TABLET    Take 10 mg by  mouth as needed for migraine. May repeat in 2 hours if needed   SILODOSIN (RAPAFLO) 8  MG CAPS CAPSULE    Take 8 mg by mouth at bedtime.    TIZANIDINE (ZANAFLEX) 2 MG TABLET    Take 2 mg by mouth 3 (three) times daily as needed.   TOPIRAMATE  (TOPAMAX ) 100 MG TABLET    Take 1 tablet (100 mg total) by mouth 2 (two) times daily.   TRAVOPROST, BAK FREE, (TRAVATAN) 0.004 % SOLN OPHTHALMIC SOLUTION    Place 1 drop into both eyes at bedtime.   Modified Medications   No medications on file  Discontinued Medications   TRAMADOL  (ULTRAM ) 50 MG TABLET    Take 1 tablet (50 mg total) by mouth 3 (three) times daily.   ----------------------------------------------------------------------------------------------------------------------  Follow-up: Return in about 2 weeks (around 07/29/2023) for evaluation, procedure.    Lynwood KANDICE Clause, MD

## 2023-08-03 ENCOUNTER — Encounter: Payer: Self-pay | Admitting: Anesthesiology

## 2023-08-03 ENCOUNTER — Ambulatory Visit: Payer: Medicare Other | Attending: Anesthesiology | Admitting: Anesthesiology

## 2023-08-03 VITALS — BP 160/68 | HR 78 | Temp 97.5°F | Resp 16 | Ht 59.0 in | Wt 130.0 lb

## 2023-08-03 DIAGNOSIS — M7918 Myalgia, other site: Secondary | ICD-10-CM

## 2023-08-03 DIAGNOSIS — M542 Cervicalgia: Secondary | ICD-10-CM

## 2023-08-03 DIAGNOSIS — M5481 Occipital neuralgia: Secondary | ICD-10-CM

## 2023-08-03 DIAGNOSIS — M48062 Spinal stenosis, lumbar region with neurogenic claudication: Secondary | ICD-10-CM | POA: Diagnosis not present

## 2023-08-03 DIAGNOSIS — Q762 Congenital spondylolisthesis: Secondary | ICD-10-CM

## 2023-08-03 DIAGNOSIS — G894 Chronic pain syndrome: Secondary | ICD-10-CM

## 2023-08-03 DIAGNOSIS — M5431 Sciatica, right side: Secondary | ICD-10-CM

## 2023-08-03 DIAGNOSIS — M545 Other chronic pain: Secondary | ICD-10-CM

## 2023-08-03 DIAGNOSIS — M961 Postlaminectomy syndrome, not elsewhere classified: Secondary | ICD-10-CM

## 2023-08-03 DIAGNOSIS — M797 Fibromyalgia: Secondary | ICD-10-CM

## 2023-08-03 MED ORDER — DEXAMETHASONE SODIUM PHOSPHATE 10 MG/ML IJ SOLN
INTRAMUSCULAR | Status: AC
  Filled 2023-08-03: qty 1

## 2023-08-03 MED ORDER — ROPIVACAINE HCL 2 MG/ML IJ SOLN
INTRAMUSCULAR | Status: AC
  Filled 2023-08-03: qty 20

## 2023-08-03 NOTE — Progress Notes (Unsigned)
 Nursing Pain Medication Assessment:  Safety precautions to be maintained throughout the outpatient stay will include: orient to surroundings, keep bed in low position, maintain call bell within reach at all times, provide assistance with transfer out of bed and ambulation.  Medication Inspection Compliance: [name] did not comply with our request to bring her pills to be counted. She was reminded that bringing the medication bottles, even when empty, is a requirement.  Medication: None brought in. Pill/Patch Count: None available to be counted. Bottle Appearance: No container available. Did not bring bottle(s) to appointment. Filled Date: N/A Last Medication intake:  YesterdaySafety precautions to be maintained throughout the outpatient stay will include: orient to surroundings, keep bed in low position, maintain call bell within reach at all times, provide assistance with transfer out of bed and ambulation.

## 2023-08-04 ENCOUNTER — Encounter: Payer: Self-pay | Admitting: Anesthesiology

## 2023-08-04 NOTE — Progress Notes (Signed)
 Subjective:  Patient ID: [name], female    DOB: [birth year]  Age: 83 y.o. MRN: [id number]  CC: Headache   Procedure: Bilateral greater occipital nerve block  HPI [name] presents for reevaluation.  She is having persistent posterior neck posterior occipital tension headaches and desires to proceed with a greater occipital nerve block.  She has had these in the past and generally gets almost 1 month of complete posterior occipital pain resolution and only over the past 2 weeks as she had any significant recurrence rated at a 50% reduction overall.  She desires to proceed with a repeat injection today.  Otherwise she is in her usual state of health.  She has had previous trigger point injections to the trapezius muscle and these are less of an issue at present.  She is trying to do some home remedy therapy with upper extremity stretching exercises for the deltoid and trapezius musculature.  Outpatient Medications Prior to Visit  Medication Sig Dispense Refill   acetaminophen (TYLENOL) 500 MG tablet Take 500 mg by mouth every 6 (six) hours as needed for mild pain.      ALPRAZolam (XANAX XR) 1 MG 24 hr tablet Take 0.5-1 mg by mouth as needed for sleep (Anxiety).      AMBULATORY NON FORMULARY MEDICATION Valium (Diazepam) 10 mg vaginal suppository. Dispense #30; 2 refills. Insert 1 suppository into vagina nightly for pelvic floor muscle relaxation. 30 each 2   ascorbic acid (VITAMIN C) 500 MG tablet Take 1,000 mg by mouth daily.     cetirizine (ZYRTEC) 10 MG tablet Take 10 mg by mouth daily.     cholecalciferol (VITAMIN D3) 10 MCG (400 UNIT) TABS tablet Take 5,000 Units by mouth daily.     cyanocobalamin (VITAMIN B12) 1000 MCG tablet Take 1,000 mcg by mouth daily.     Dapagliflozin Propanediol (FARXIGA PO) Take 10 mg by mouth daily at 6 (six) AM.     dexlansoprazole (DEXILANT) 60 MG capsule TAKE 1 CAPSULE BY MOUTH DAILY BEFORE BREAKFAST. 90 capsule 0   dicyclomine (BENTYL) 10 MG  capsule Take 1 capsule (10 mg total) by mouth every 12 (twelve) hours as needed for spasms (abdominal pain/discomfort). 60 capsule 2   Docusate Sodium (DSS) 100 MG CAPS Take 100 mg by mouth daily as needed.     Erenumab-aooe (AIMOVIG [place]) Inject into the skin every 30 (thirty) days.     Evolocumab (REPATHA [place]) Inject into the skin. Every two weeks.     famotidine (PEPCID) 40 MG tablet Take one po 30-45 minutes prior to supper. 90 tablet 3   fluticasone (FLONASE) 50 MCG/ACT nasal spray Place 2 sprays into both nostrils daily.     gabapentin (NEURONTIN) 100 MG capsule Take 2 capsules by mouth 2 (two) times daily.     HYDROcodone-acetaminophen (NORCO/VICODIN) 5-325 MG tablet Take 1 tablet by mouth 2 (two) times daily. 45 tablet 0   hydrocortisone (ANUSOL-HC) 2.5 % rectal cream APPLY 1 APPLICATION RECTALLY TWICE DAILY. 30 g 1   Insulin Glargine-Lixisenatide (SOLIQUA [place]) Inject 15 Units into the skin in the morning.     linaclotide (LINZESS) 72 MCG capsule Take 1 capsule (72 mcg total) by mouth daily before breakfast. 90 capsule 3   lisinopril (ZESTRIL) 2.5 MG tablet Take 2.5 mg by mouth daily.     loperamide (IMODIUM) 2 MG capsule Take 1 capsule (2 mg total) by mouth 2 (two) times daily as needed for diarrhea or loose stools.  Milnacipran HCl (SAVELLA) 100 MG TABS tablet Take 100 mg by mouth 2 (two) times daily.     Multiple Vitamin (MULTIVITAMIN WITH MINERALS) TABS tablet Take 1 tablet by mouth daily.     polyethylene glycol powder (GLYCOLAX/MIRALAX) 17 GM/SCOOP powder Take 0.5 Containers by mouth daily as needed for mild constipation.     polyvinyl alcohol (LIQUIFILM TEARS) 1.4 % ophthalmic solution Place 1 drop into both eyes 3 (three) times daily as needed for dry eyes.     promethazine (PHENERGAN) 12.5 MG tablet Take 12.5 mg by mouth every 8 (eight) hours as needed.     rizatriptan (MAXALT-MLT) 10 MG disintegrating tablet Take 10 mg by mouth as needed for migraine. May repeat in 2 hours if  needed     silodosin (RAPAFLO) 8 MG CAPS capsule Take 8 mg by mouth at bedtime.      tiZANidine (ZANAFLEX) 2 MG tablet Take 2 mg by mouth 3 (three) times daily as needed.     topiramate (TOPAMAX) 100 MG tablet Take 1 tablet (100 mg total) by mouth 2 (two) times daily. 60 tablet 5   Travoprost, BAK Free, (TRAVATAN) 0.004 % SOLN ophthalmic solution Place 1 drop into both eyes at bedtime.      No facility-administered medications prior to visit.    Review of Systems CNS: No confusion or sedation Cardiac: No angina or palpitations GI: No abdominal pain or constipation Constitutional: No nausea vomiting fevers or chills  Objective:  BP (!) 160/68 (Cuff Size: Normal)   Pulse 78   Temp (!) 97.5 F (36.4 C) (Temporal)   Resp 16   Ht 4\' 11"  (1.499 m)   Wt 130 lb (59 kg)   SpO2 100%   BMI 26.26 kg/m    BP Readings from Last 3 Encounters:  08/03/23 (!) 160/68  07/23/23 (!) 161/87  07/15/23 135/69     Wt Readings from Last 3 Encounters:  08/03/23 130 lb (59 kg)  07/15/23 130 lb (59 kg)  07/01/23 130 lb (59 kg)     Physical Exam Pt is alert and oriented PERRL EOMI HEART IS RRR no murmur or rub LCTA no wheezing or rales MUSCULOSKELETAL reveals tenderness over the greater occipital notch bilaterally.  She is also less tender over the bilateral trapezius muscles.  Labs  No results found for: "HGBA1C" Lab Results  Component Value Date   CREATININE 0.89 04/21/2023    -------------------------------------------------------------------------------------------------------------------- Lab Results  Component Value Date   WBC 8.4 06/18/2023   HGB 12.6 06/18/2023   HCT 40.7 06/18/2023   PLT 223 06/18/2023   GLUCOSE 207 (H) 04/21/2023   ALT 42 (H) 04/21/2023   AST 13 04/21/2023   NA 140 04/21/2023   K 3.6 04/21/2023   CL 103 04/21/2023   CREATININE 0.89 04/21/2023   BUN 14 04/21/2023   CO2 28 04/21/2023   TSH 1.31 12/25/2015   INR 1.03 04/19/2013     --------------------------------------------------------------------------------------------------------------------- No results found.   Assessment & Plan:   There are no diagnoses linked to this encounter.      ----------------------------------------------------------------------------------------------------------------------  Problem List Items Addressed This Visit   None     ----------------------------------------------------------------------------------------------------------------------  There are no diagnoses linked to this encounter.   ----------------------------------------------------------------------------------------------------------------------  I am having Kipp Brood. Marcinek maintain her ALPRAZolam, acetaminophen, silodosin, topiramate, Travoprost (BAK Free), multivitamin with minerals, polyvinyl alcohol, lisinopril, hydrocortisone, loperamide, Dapagliflozin Propanediol (FARXIGA PO), dicyclomine, Erenumab-aooe (AIMOVIG [place]), Evolocumab (REPATHA [place]), linaclotide, Insulin Glargine-Lixisenatide (SOLIQUA [place]), gabapentin, rizatriptan, cyanocobalamin, cholecalciferol, Milnacipran HCl,  DSS, ascorbic acid, cetirizine, fluticasone, polyethylene glycol powder, promethazine, tiZANidine, AMBULATORY NON FORMULARY MEDICATION, famotidine, dexlansoprazole, and HYDROcodone-acetaminophen.   No orders of the defined types were placed in this encounter.  Patient's Medications  New Prescriptions   No medications on file  Previous Medications   ACETAMINOPHEN (TYLENOL) 500 MG TABLET    Take 500 mg by mouth every 6 (six) hours as needed for mild pain.    ALPRAZOLAM (XANAX XR) 1 MG 24 HR TABLET    Take 0.5-1 mg by mouth as needed for sleep (Anxiety).    AMBULATORY NON FORMULARY MEDICATION    Valium (Diazepam) 10 mg vaginal suppository. Dispense #30; 2 refills. Insert 1 suppository into vagina nightly for pelvic floor muscle relaxation.   ASCORBIC ACID (VITAMIN C) 500 MG TABLET     Take 1,000 mg by mouth daily.   CETIRIZINE (ZYRTEC) 10 MG TABLET    Take 10 mg by mouth daily.   CHOLECALCIFEROL (VITAMIN D3) 10 MCG (400 UNIT) TABS TABLET    Take 5,000 Units by mouth daily.   CYANOCOBALAMIN (VITAMIN B12) 1000 MCG TABLET    Take 1,000 mcg by mouth daily.   DAPAGLIFLOZIN PROPANEDIOL (FARXIGA PO)    Take 10 mg by mouth daily at 6 (six) AM.   DEXLANSOPRAZOLE (DEXILANT) 60 MG CAPSULE    TAKE 1 CAPSULE BY MOUTH DAILY BEFORE BREAKFAST.   DICYCLOMINE (BENTYL) 10 MG CAPSULE    Take 1 capsule (10 mg total) by mouth every 12 (twelve) hours as needed for spasms (abdominal pain/discomfort).   DOCUSATE SODIUM (DSS) 100 MG CAPS    Take 100 mg by mouth daily as needed.   ERENUMAB-AOOE (AIMOVIG [place])    Inject into the skin every 30 (thirty) days.   EVOLOCUMAB (REPATHA [place])    Inject into the skin. Every two weeks.   FAMOTIDINE (PEPCID) 40 MG TABLET    Take one po 30-45 minutes prior to supper.   FLUTICASONE (FLONASE) 50 MCG/ACT NASAL SPRAY    Place 2 sprays into both nostrils daily.   GABAPENTIN (NEURONTIN) 100 MG CAPSULE    Take 2 capsules by mouth 2 (two) times daily.   HYDROCODONE-ACETAMINOPHEN (NORCO/VICODIN) 5-325 MG TABLET    Take 1 tablet by mouth 2 (two) times daily.   HYDROCORTISONE (ANUSOL-HC) 2.5 % RECTAL CREAM    APPLY 1 APPLICATION RECTALLY TWICE DAILY.   INSULIN GLARGINE-LIXISENATIDE (SOLIQUA [place])    Inject 15 Units into the skin in the morning.   LINACLOTIDE (LINZESS) 72 MCG CAPSULE    Take 1 capsule (72 mcg total) by mouth daily before breakfast.   LISINOPRIL (ZESTRIL) 2.5 MG TABLET    Take 2.5 mg by mouth daily.   LOPERAMIDE (IMODIUM) 2 MG CAPSULE    Take 1 capsule (2 mg total) by mouth 2 (two) times daily as needed for diarrhea or loose stools.   MILNACIPRAN HCL (SAVELLA) 100 MG TABS TABLET    Take 100 mg by mouth 2 (two) times daily.   MULTIPLE VITAMIN (MULTIVITAMIN WITH MINERALS) TABS TABLET    Take 1 tablet by mouth daily.   POLYETHYLENE GLYCOL POWDER (GLYCOLAX/MIRALAX) 17  GM/SCOOP POWDER    Take 0.5 Containers by mouth daily as needed for mild constipation.   POLYVINYL ALCOHOL (LIQUIFILM TEARS) 1.4 % OPHTHALMIC SOLUTION    Place 1 drop into both eyes 3 (three) times daily as needed for dry eyes.   PROMETHAZINE (PHENERGAN) 12.5 MG TABLET    Take 12.5 mg by mouth every 8 (eight) hours as needed.  RIZATRIPTAN (MAXALT-MLT) 10 MG DISINTEGRATING TABLET    Take 10 mg by mouth as needed for migraine. May repeat in 2 hours if needed   SILODOSIN (RAPAFLO) 8 MG CAPS CAPSULE    Take 8 mg by mouth at bedtime.    TIZANIDINE (ZANAFLEX) 2 MG TABLET    Take 2 mg by mouth 3 (three) times daily as needed.   TOPIRAMATE (TOPAMAX) 100 MG TABLET    Take 1 tablet (100 mg total) by mouth 2 (two) times daily.   TRAVOPROST, BAK FREE, (TRAVATAN) 0.004 % SOLN OPHTHALMIC SOLUTION    Place 1 drop into both eyes at bedtime.   Modified Medications   No medications on file  Discontinued Medications   No medications on file   ----------------------------------------------------------------------------------------------------------------------  Follow-up: No follow-ups on file.  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 Occipital neuralgia of left side   3. Chronic bilateral low back pain without sciatica   4. SPONDYLOLITHESIS   5. Cervicalgia   6. Sciatica of right side   7. Fibromyalgia   8. Cervical post-laminectomy syndrome   9. Chronic pain syndrome   10. Cervical myofascial pain syndrome    Per our conversation we will repeat her injection today.  She is done very well with these and she is having less frequent and less severe headaches.  The risks and benefits of the greater simple nerve block are fully detail all questions answered.  Continue with massage TENS unit application and heat and ice periodically to this area.  Continue with upper extremity stretching exercise as she is doing.  Will schedule her for return to clinic in 2 months   Greater  occipital nerve block on the bilateral side , blood pressure, pulse, pulse oximetry monitoring. Greater occipital nerve block on the left and right side. Following identification of the nuchal ridge, before-gauge needle was inserted at the level of the nuchal ridge medial to the occipital artery.  Following negative aspiration, 4cc 0.2% ropivacaine with 4 mg of Decadron injected for left greater occipital nerve block.  Needle was removed.   The same procedure was performed on the right side as above.  There was once again negative aspiration and a 24-gauge needle was used to infiltrate 4 cc of the same mixture on the right side.  Patient tolerated injection well.  Yevette Edwards, MD

## 2023-08-17 ENCOUNTER — Other Ambulatory Visit (INDEPENDENT_AMBULATORY_CARE_PROVIDER_SITE_OTHER): Payer: Self-pay | Admitting: Gastroenterology

## 2023-08-17 DIAGNOSIS — K581 Irritable bowel syndrome with constipation: Secondary | ICD-10-CM

## 2023-08-27 ENCOUNTER — Telehealth (INDEPENDENT_AMBULATORY_CARE_PROVIDER_SITE_OTHER): Payer: Self-pay | Admitting: *Deleted

## 2023-08-27 ENCOUNTER — Ambulatory Visit (INDEPENDENT_AMBULATORY_CARE_PROVIDER_SITE_OTHER): Payer: Medicare Other | Admitting: Gastroenterology

## 2023-08-27 VITALS — BP 161/82 | HR 52 | Temp 98.7°F | Ht 59.0 in | Wt 140.0 lb

## 2023-08-27 DIAGNOSIS — K219 Gastro-esophageal reflux disease without esophagitis: Secondary | ICD-10-CM | POA: Diagnosis not present

## 2023-08-27 DIAGNOSIS — R1031 Right lower quadrant pain: Secondary | ICD-10-CM

## 2023-08-27 DIAGNOSIS — K220 Achalasia of cardia: Secondary | ICD-10-CM | POA: Diagnosis not present

## 2023-08-27 DIAGNOSIS — K5903 Drug induced constipation: Secondary | ICD-10-CM

## 2023-08-27 DIAGNOSIS — K5900 Constipation, unspecified: Secondary | ICD-10-CM

## 2023-08-27 MED ORDER — VOQUEZNA 20 MG PO TABS: 20.0000 mg | ORAL_TABLET | Freq: Every day | ORAL | 0 refills | Status: DC

## 2023-08-27 MED ORDER — NALOXEGOL OXALATE 12.5 MG PO TABS: 12.5000 mg | ORAL_TABLET | Freq: Every day | ORAL | 1 refills | Status: DC

## 2023-08-27 NOTE — Telephone Encounter (Signed)
 Patient seen today and prescribed voquezna. Sent to [facility] by [facility] and pt aware it will come from [facility] and will need to drop to 10mg  after that. Will hold to see if needs prior auth

## 2023-08-27 NOTE — Progress Notes (Addendum)
 Referring Provider: Armando Gang, FNP Primary Care Physician:  Armando Gang, FNP Primary GI Physician: Dr. Levon Hedger   Chief Complaint  Patient presents with   Abdominal Pain    Follow up on abdominal pain. Having some pain on right lower side.    Gastroesophageal Reflux    Follow up on GERD. States med is not helping and drinking a lot of mylanta.    Constipation    Follow up on constipation. Takes linzess and still has some issues with constipation.    HPI:   [name] is a 83 y.o. female with past medical history of anxiety, diabetes, diabetic gastroparesis, fibromyalgia, chronic idiopathic constipation, GERD, TIA, achalasia status post Heller myotomy, history of colorectal cancer status post resection in 2010   Patient presenting today for:  Follow up of dysphagia/achalasia, GERD, constipation and abdominal pain  Last seen November 2024, at that time, patient having continued issues with dysphagia, wanting to be referred to [facility] for possible evaluation.  Still having GERD pretty frequently on Dexilant and Famotidine.  Doing protein shakes and tolerating some solid foods.  Patient did endorse some right upper quadrant/epigastric pain.  Taking Mylanta and famotidine which does not provide much improvement.  Taking Gas-X at times which sometimes helps.  Doing MiraLAXfor constipation which provides good results, using Linzess only on occasion.  Patient recommended to continue Dexilant 60 mg daily, continue famotidine 40 mg daily, continue MiraLAX 1-2 capfuls daily, Linzess as needed, refer to Dr. Hoy Finlay at [facility] for colon/versus repeat HM, check CMP, possible abdominal imaging pending LFT results.  CMP done 04/21/23 with mildly elevated ALT of 42, CT A/P with contrast ordered, as outlined below, recommended to undergo colonoscopy due to findings of rectal wall thickening.  Flex sig done in January 2025, with one polyp, rectocele and hemorrhoids   Present:  She  continues to have issues with swallowing though notes it may be slightly better than before. She has an appt at [facility] regarding possible HM vs. POEM in April. She notes she has had some issues with regurgitating her food/feeling like she was choking. Her daughter reports that if she takes too many bites, she will regurgitate her food back up. this occurs almost every evening with almost anything she eats. Reassuringly she has gained some weight. Her daughter states she is still doing some protein shakes at times.   She has heartburn almost everyday on dexilant 60mg  daily and famotidine 40mg  daily, she is doing TUMS and mylanta pretty frequently, worse at night, despite elevating HOB. she never sleeps flat.   She continues to have some abdominal pain near RLQ. She cannot pinpoint any precipitating or alleviating factors. She continues to have constipation, having a BM maybe twice per week. She is taking linzess daily, doing miralax PRN but notes sometimes she does 2 capfuls per day which seems to help some. She is having to sit and strain a lot more recently. Daughter states patient was started on vicodin BID recently for back pain and may likely be on this long term.    CT A/P with contrast 04/27/23: Right middle lobe 5 mm ground-glass pulmonary nodule. No follow-up recommended. This recommendation follows the consensus statement: Guidelines for Management of Incidental Pulmonary Nodules Detected on CT Images: From the Fleischner Society 2017; Radiology 2017; 284:228-243. 2. Irregular urinary bladder wall thickening. Correlate urinalysis for infection. Recommend urologic consultation given irregularity and underlying malignancy not excluded. 3. Mild proximal right hydroureter and slightly more prominent right  extrarenal pelvis. No hydronephrosis. No right nephroureterolithiasis. This may reflect the residual of a recently passed calculus or reflect chronic changes of obstructive uropathy or  reflux. 4. Nonobstructive left nephrolithiasis-measuring up 2 mm. 5. At least small volume mixed sliding and paraesophageal hiatal hernia. 6. Rectal wall thickening likely due to under distension. No definite findings of colitis. If clinically indicated, consider colonoscopy for further evaluation. 7. Severe degenerative changes of grade 2 anterolisthesis of L5 on S1.  esophageal manometry and pH impedance testing: 04/2023  changes consistent with type III achalasia with persistently elevated IRP.  She also had an elevated DeMeester score while off Dexilant consistent with ongoing GERD. She will benefit from continuing the Dexilant.  She has previously been treated for achalasia with Heller myotomy but symptoms have recurred.  At this point, I advised that she would benefit from being evaluated at [facility] for possible POEM (Dr. Hoy Finlay) versus repeat Heller myotomy, but if she is not a candidate or if she is not interested in this she may need to have an evaluation for PEG tube placement.  EGD: 01/2021 - Normal proximal esophagus. - Esophageal plaques were found, suspicious for candidiasis. Cells for cytology obtained -consistent with Candida. - Abnormal (rule out Barrett's esophagus) mucosa in the esophagus.  Biopsies were consistent with reflux - Z-line irregular, 33 cm from the incisors. - Small hyperplastic appearing polyp in gastric fundus. Was left alone. - Gastritis. Biopsied, negative for H. pylori or intestinal metaplasia. - A fundoplication was found. The wrap appears loose. - Normal duodenal bulb and second portion of the duodenum. Flex sig: 06/2023- Hemorrhoids found on perianal exam.                           - Rectocele found on perianal exam.                           - One 3 mm polyp in the sigmoid colon, removed with                            a cold snare. Resected and retrieved.                           - Non-bleeding internal hemorrhoids. (Hyperplastic polyp)  Filed Weights    08/27/23 0914  Weight: 140 lb (63.5 kg)     Past Medical History:  Diagnosis Date   Anemia    Anxiety    Arthritis    "all over" (04/19/2013)   Asthma    Bleeding stomach ulcer 02/08/1979   Bloating    Chronic back pain    Chronic headaches    Chronic heartburn    Chronic neck pain    Chronic pain    Colon cancer (HCC)    DDD (degenerative disc disease), lumbar    Diabetic peripheral neuropathy (HCC)    "in my feet" (04/19/2013)   Dizziness    Dysphagia    Family history of anesthesia complication    "daughter has PONV too" (04/19/2013)   Fibromyalgia    Gastroesophageal reflux    Gastroparesis    Glaucoma, bilateral    Hiatal hernia    "had it before; had OR; got it again" (04/19/2013)   History of blood transfusion 02/08/1979   "w/bleeding stomach ulcer" (04/19/2013)   History of kidney stones  Migraine    "take RX for it qd" (04/19/2013)   Nausea    Pneumonia 04/09/2012   PONV (postoperative nausea and vomiting)    Renal insufficiency    TIA (transient ischemic attack)    "3-4 before starting RX; none since" (04/19/2013)   Type II diabetes mellitus (HCC)    Walking pneumonia ~ 1966    Past Surgical History:  Procedure Laterality Date   ABDOMINAL ADHESION SURGERY  ~ 2012   "repaired wrap where they did hiatal hernia OR too" 911/04/2013)   ANTERIOR CERVICAL DISCECTOMY  ~ 2009   "only cleaned out arthritis and spurs" (04/19/2013)   BIOPSY  01/09/2021   Procedure: BIOPSY;  Surgeon: Malissa Hippo, MD;  [facility];  Service: Endoscopy;;  antrum; distal esophagus;   CATARACT EXTRACTION W/ INTRAOCULAR LENS IMPLANT Left 04/13/2013   CHOLECYSTECTOMY  1980's   COLON SURGERY     COLONOSCOPY  10/11/2010   COLONOSCOPY  11/23/2009   COLONOSCOPY  07/28/2008   W/SNARE   COLONOSCOPY  06/28/07   COLONOSCOPY  05/10/07   W/POLYP   COLONOSCOPY  12/28/00   COLONOSCOPY N/A 06/29/2015   Procedure: COLONOSCOPY;  Surgeon: Malissa Hippo, MD;  [facility]: [facility];  Service: Endoscopy;  Laterality: N/A;  135   COLONOSCOPY WITH ESOPHAGOGASTRODUODENOSCOPY (EGD) N/A 05/13/2013   Procedure: COLONOSCOPY WITH ESOPHAGOGASTRODUODENOSCOPY (EGD);  Surgeon: Malissa Hippo, MD;  [facility];  Service: Endoscopy;  Laterality: N/A;  855   COLONOSCOPY WITH PROPOFOL N/A 01/09/2021   Procedure: COLONOSCOPY WITH PROPOFOL;  Surgeon: Malissa Hippo, MD;  [facility];  Service: Endoscopy;  Laterality: N/A;  12:50   ESOPHAGOGASTRODUODENOSCOPY (EGD) WITH PROPOFOL N/A 10/06/2018   Procedure: ESOPHAGOGASTRODUODENOSCOPY (EGD) WITH PROPOFOL;  Surgeon: Malissa Hippo, MD;  [facility];  Service: Endoscopy;  Laterality: N/A;   ESOPHAGOGASTRODUODENOSCOPY (EGD) WITH PROPOFOL N/A 01/09/2021   Procedure: ESOPHAGOGASTRODUODENOSCOPY (EGD) WITH PROPOFOL;  Surgeon: Malissa Hippo, MD;  [facility];  Service: Endoscopy;  Laterality: N/A;   FLEXIBLE SIGMOIDOSCOPY N/A 06/23/2023   Procedure: FLEXIBLE SIGMOIDOSCOPY;  Surgeon: Dolores Frame, MD;  [facility];  Service: Gastroenterology;  Laterality: N/A;  9:30AM;ASA 3   HEMICOLECTOMY  2010   ZIEGLER   HIATAL HERNIA REPAIR  1970's   LEFT HEART CATHETERIZATION WITH CORONARY ANGIOGRAM N/A 04/19/2013   Procedure: LEFT HEART CATHETERIZATION WITH CORONARY ANGIOGRAM;  Surgeon: Peter M Swaziland, MD;  Location: [facility] CATH LAB;  Service: Cardiovascular;  Laterality: N/A;   POLYPECTOMY  10/06/2018   Procedure: POLYPECTOMY;  Surgeon: Malissa Hippo, MD;  [facility];  Service: Endoscopy;;  gastric   POLYPECTOMY  01/09/2021   Procedure: POLYPECTOMY;  Surgeon: Malissa Hippo, MD;  [facility];  Service: Endoscopy;;  transverse;splenic flexure   POLYPECTOMY  06/23/2023   Procedure: POLYPECTOMY INTESTINAL;  Surgeon: Dolores Frame, MD;  [facility];  Service: Gastroenterology;;   POSTERIOR FUSION CERVICAL SPINE  1985   "had a broken  neck" (04/19/2013)   TONSILLECTOMY  ~ 1953   TOOTH EXTRACTION Bilateral 12/04/2020   Procedure: BILATERAL TEMPOROMANDIBULAR JOINT ARTHROCENTESIS; DENTAL EXTRACTION TEETH #3,6,7,8,9,10,11,12,19,23,24,25,26,32 WITH ALVEOLOPLASTY;  Surgeon: Ocie Doyne, DMD;  Location: [facility];  Service: Oral Surgery;  Laterality: Bilateral;   UPPER GASTROINTESTINAL ENDOSCOPY  10/11/2010   EGD ED   UPPER GASTROINTESTINAL ENDOSCOPY  11/23/2009   UPPER GASTROINTESTINAL ENDOSCOPY  05/10/07   EGD ED   UPPER GASTROINTESTINAL ENDOSCOPY  08/11/01   EGD ED   VAGINAL HYSTERECTOMY      Current Outpatient Medications  Medication Sig Dispense Refill   acetaminophen (TYLENOL) 500 MG tablet Take 500 mg by mouth every 6 (six) hours as needed for mild pain.      ALPRAZolam (XANAX XR) 1 MG 24 hr tablet Take 0.5-1 mg by mouth as needed for sleep (Anxiety).      ascorbic acid (VITAMIN C) 500 MG tablet Take 1,000 mg by mouth daily.     cholecalciferol (VITAMIN D3) 10 MCG (400 UNIT) TABS tablet Take 5,000 Units by mouth daily.     cyanocobalamin (VITAMIN B12) 1000 MCG tablet Take 1,000 mcg by mouth daily.     Dapagliflozin Propanediol (FARXIGA PO) Take 10 mg by mouth daily at 6 (six) AM.     dexlansoprazole (DEXILANT) 60 MG capsule TAKE 1 CAPSULE BY MOUTH DAILY BEFORE BREAKFAST. 90 capsule 0   Docusate Sodium (DSS) 100 MG CAPS Take 100 mg by mouth daily as needed.     Erenumab-aooe (AIMOVIG [place]) Inject into the skin every 30 (thirty) days.     Evolocumab (REPATHA ) Inject into the skin. Every two weeks.     famotidine (PEPCID) 40 MG tablet Take one po 30-45 minutes prior to supper. 90 tablet 3   fluticasone (FLONASE) 50 MCG/ACT nasal spray Place 2 sprays into both nostrils daily.     gabapentin (NEURONTIN) 100 MG capsule Take 2 capsules by mouth 2 (two) times daily.     HYDROcodone-acetaminophen (NORCO/VICODIN) 5-325 MG tablet Take 1 tablet by mouth 2 (two) times daily. 45 tablet 0   hydrocortisone (ANUSOL-HC) 2.5 % rectal  cream APPLY 1 APPLICATION RECTALLY TWICE DAILY. 30 g 1   Insulin Glargine-Lixisenatide (SOLIQUA ) Inject 15 Units into the skin in the morning.     LINZESS 72 MCG capsule TAKE ONE CAPSULE BY MOUTH ONCE DAILY. 30 capsule 11   lisinopril (ZESTRIL) 5 MG tablet Take 5 mg by mouth daily.     loperamide (IMODIUM) 2 MG capsule Take 1 capsule (2 mg total) by mouth 2 (two) times daily as needed for diarrhea or loose stools.     Milnacipran HCl (SAVELLA) 100 MG TABS tablet Take 100 mg by mouth 2 (two) times daily.     Multiple Vitamin (MULTIVITAMIN WITH MINERALS) TABS tablet Take 1 tablet by mouth daily.     polyethylene glycol powder (GLYCOLAX/MIRALAX) 17 GM/SCOOP powder Take 0.5 Containers by mouth daily as needed for mild constipation.     polyvinyl alcohol (LIQUIFILM TEARS) 1.4 % ophthalmic solution Place 1 drop into both eyes 3 (three) times daily as needed for dry eyes.     promethazine (PHENERGAN) 12.5 MG tablet Take 12.5 mg by mouth every 8 (eight) hours as needed.     rizatriptan (MAXALT-MLT) 10 MG disintegrating tablet Take 10 mg by mouth as needed for migraine. May repeat in 2 hours if needed     silodosin (RAPAFLO) 8 MG CAPS capsule Take 8 mg by mouth at bedtime.      tiZANidine (ZANAFLEX) 2 MG tablet Take 2 mg by mouth 3 (three) times daily as needed.     topiramate (TOPAMAX) 100 MG tablet Take 1 tablet (100 mg total) by mouth 2 (two) times daily. 60 tablet 5   Travoprost, BAK Free, (TRAVATAN) 0.004 % SOLN ophthalmic solution Place 1 drop into both eyes at bedtime.      dicyclomine (BENTYL) 10 MG capsule Take 1 capsule (10 mg total) by mouth every  12 (twelve) hours as needed for spasms (abdominal pain/discomfort). (Patient not taking: Reported on 08/27/2023) 60 capsule 2   No current facility-administered medications for this visit.    Allergies as of 08/27/2023 - Review Complete 08/27/2023  Allergen Reaction Noted   Botox [onabotulinumtoxina]  05/05/2017   Morphine Anaphylaxis and Other  (See Comments)    Aspirin Other (See Comments) 05/02/2012   Cefuroxime axetil Nausea And Vomiting 03/25/2013   Codeine Nausea And Vomiting    Diltiazem Nausea And Vomiting 03/25/2013   Dronabinol Nausea And Vomiting and Palpitations 10/24/2013   Shellfish allergy Other (See Comments) 03/11/2011   Other  10/15/2021   Ondansetron Hives 10/28/2011   Penicillins Rash     Social History   Socioeconomic History   Marital status: Widowed    Spouse name: Not on file   Number of children: Not on file   Years of education: Not on file   Highest education level: Not on file  Occupational History    Employer: RETIRED  Tobacco Use   Smoking status: Former    Current packs/day: 0.00    Average packs/day: 1.5 packs/day for 15.0 years (22.5 ttl pk-yrs)    Types: Cigarettes    Start date: 03/16/1976    Quit date: 03/17/1991    Years since quitting: 32.4    Passive exposure: Past   Smokeless tobacco: Never   Tobacco comments:    Patient states that it has ben greater than 20 years since she quit smoking  Vaping Use   Vaping status: Never Used  Substance and Sexual Activity   Alcohol use: No    Alcohol/week: 0.0 standard drinks of alcohol   Drug use: No   Sexual activity: Not Currently  Other Topics Concern   Not on file  Social History Narrative   Patient lives at home alone.    Patient is retired.    Patient is widowed.    Patient has 4 children.   Patient has a 11th grade education.          Social Drivers of Corporate investment banker Strain: Not on file  Food Insecurity: Not on file  Transportation Needs: Not on file  Physical Activity: Not on file  Stress: Not on file  Social Connections: Not on file    Review of systems General: negative for malaise, night sweats, fever, chills, weight loss Neck: Negative for lumps, goiter, pain and significant neck swelling Resp: Negative for cough, wheezing, dyspnea at rest CV: Negative for chest pain, leg swelling,  palpitations, orthopnea GI: denies melena, hematochezia, nausea, vomiting, diarrhea, odyonophagia, early satiety or unintentional weight loss. +GERD symptoms +constipation +dysphagia +RLQ pain  The remainder of the review of systems is noncontributory.  Physical Exam: BP (!) 161/82   Pulse (!) 52   Temp 98.7 F (37.1 C) (Oral)   Ht 4\' 11"  (1.499 m)   Wt 140 lb (63.5 kg)   BMI 28.28 kg/m  General:   Alert and oriented. No distress noted. Pleasant and cooperative.  Head:  Normocephalic and atraumatic. Eyes:  Conjuctiva clear without scleral icterus. Mouth:  Oral mucosa pink and moist. Good dentition. No lesions. Heart: Normal rate and rhythm, s1 and s2 heart sounds present.  Lungs: Clear lung sounds in all lobes. Respirations equal and unlabored. Abdomen:  +BS, soft, non-tender and non-distended. No rebound or guarding. No HSM or masses noted. Neurologic:  Alert and  oriented x4 Psych:  Alert and cooperative. Normal mood and affect.  Invalid input(s): "6  MONTHS"   ASSESSMENT: GHAZAL PEVEY is a 83 y.o. female presenting today for follow up of dysphagia/achalasia, GERD, constipation and abdominal pain.  Dysphagia/achalasia: has appt at [facility] for discussion regarding POEM vs. HM, in April. Encouraged to keep this appt for further evaluation. Should continue to take small bites, chew well and do protein shakes in between meals as she is doing.   GERD:  Recent pH impedance testing showed elevated DeMeester score consistent with ongoing GERD. Has been maintained on dexilant 60mg  daily and famotidine 40mg  at bedtime, notes she is having heartburn and regurgitation almost daily, having to use tums/ mylanta. At this time, would recommend stopping dexilant and trying voquezna 20mg  daily x8 weeks then 10mg  daily thereafter. Instructed on good reflux precautions and continued elevation of HOB.  Constipation/abdominal pain: continues to have some RLQ pain. Recent colonoscopy as above. Suspect  constipation is contributing to her abdominal pain. previously taking miralax, linzess PRN, now doing linzess daily and intermittent miralax, sounds constipation has worsened recently, likely in light of newly prescribed opiates for her chronic pain. Recommend stopping linzess/miralax and starting movantik as she may see better results with an opioid antagonist.    PLAN:  -stop dexilant, and famotidine -start voquezna 20mg  daily x8 weeks, 10mg  thereafter  -stop linzess/miralax  -start movantik 12.5mg  daily  -continue protein shakes, chewing precautions -keep appt with [facility] in april -good reflux precautions  All questions were answered, patient verbalized understanding and is in agreement with plan as outlined above.    Follow Up: 4 months   Abbagale Goguen L. Jeanmarie Hubert, MSN, APRN, AGNP-C Adult-Gerontology Nurse Practitioner [facility]  I have reviewed the note and agree with the APP's assessment as described in this progress note  It is very possible persistent heartburn is related to food stasis in the setting of achalasia. Will attempt Voquezna trial, but it is very important to have some definitive treatment at [facility].  Katrinka Blazing, MD Gastroenterology and Hepatology [facility] Gastroenterology

## 2023-08-27 NOTE — Patient Instructions (Signed)
 Please stop dexilant and famotidine We will start voquezna 20mg  daily x8 weeks then go down to 10mg  daily thereafter for acid reflux Please stop linzess and miralax, I will start movantik to help with your constipation Continue to take small bites, and chew well, do protein shakes in between meals for added nutrition Please keep appt with [facility] in April  Follow up 4 months  It was a pleasure to see you today. I want to create trusting relationships with patients and provide genuine, compassionate, and quality care. I truly value your feedback! please be on the lookout for a survey regarding your visit with me today. I appreciate your input about our visit and your time in completing this!    Taleigha Pinson L. Jeanmarie Hubert, MSN, APRN, AGNP-C Adult-Gerontology Nurse Practitioner [facility] Gastroenterology at [facility]

## 2023-08-28 ENCOUNTER — Other Ambulatory Visit (INDEPENDENT_AMBULATORY_CARE_PROVIDER_SITE_OTHER): Payer: Self-pay | Admitting: *Deleted

## 2023-08-28 ENCOUNTER — Telehealth (INDEPENDENT_AMBULATORY_CARE_PROVIDER_SITE_OTHER): Payer: Self-pay | Admitting: *Deleted

## 2023-08-28 NOTE — Telephone Encounter (Signed)
 Pa submitted through cover my meds for voquezna 20mg . Fax from [employer] that medication is denied. Medicare advantage plan does not cover outpatient prescription drugs that may be eligible for coverage under part d. [facility]. I tried to call and no answer to see which va she uses since two are in her chart and send rx to [place] to see if they will cover the med for her.

## 2023-08-28 NOTE — Telephone Encounter (Signed)
Tried to call and no answer.  

## 2023-08-28 NOTE — Progress Notes (Deleted)
 Pa submitted through cover my meds for voquezna 20mg . Fax from [employer] that medication is denied. Medicare advantage plan does not cover outpatient prescription drugs that may be eligible for coverage under part d. [facility]. I tried to call and no answer to see which va she uses since two are in her chart and send rx to [place] to see if they will cover the med for her.

## 2023-08-31 NOTE — Telephone Encounter (Signed)
 Tried to call no answer

## 2023-09-01 ENCOUNTER — Other Ambulatory Visit (INDEPENDENT_AMBULATORY_CARE_PROVIDER_SITE_OTHER): Payer: Self-pay | Admitting: *Deleted

## 2023-09-01 MED ORDER — VOQUEZNA 20 MG PO TABS
20.0000 mg | ORAL_TABLET | Freq: Every day | ORAL | 0 refills | Status: DC
Start: 2023-09-01 — End: 2023-09-08

## 2023-09-01 NOTE — Telephone Encounter (Signed)
 Spoke with patients daughter Janalee Dane ( on dpr ) and she told me [facility]. Med sent to pharmacy.

## 2023-09-03 NOTE — Telephone Encounter (Signed)
error 

## 2023-09-04 NOTE — Telephone Encounter (Signed)
 [facility] to [facility] on [facility] and was told they do not carry the voquenza. She was unable to fill at local pharmacy due to not having prescription coverage and gets all meds [facility] [place]. [facility] told me [facility] a letter that they would not be able to get this medication for her.

## 2023-09-08 ENCOUNTER — Other Ambulatory Visit (INDEPENDENT_AMBULATORY_CARE_PROVIDER_SITE_OTHER): Payer: Self-pay | Admitting: Gastroenterology

## 2023-09-08 MED ORDER — OMEPRAZOLE 40 MG PO CPDR
40.0000 mg | DELAYED_RELEASE_CAPSULE | Freq: Two times a day (BID) | ORAL | 3 refills | Status: DC
Start: 2023-09-08 — End: 2023-12-23

## 2023-09-08 NOTE — Telephone Encounter (Signed)
 Tried calling the patient vm, full unable to leave a message.

## 2023-09-09 NOTE — Telephone Encounter (Signed)
 Daughter notified

## 2023-09-09 NOTE — Telephone Encounter (Signed)
 Left message to return call

## 2023-09-21 ENCOUNTER — Ambulatory Visit (INDEPENDENT_AMBULATORY_CARE_PROVIDER_SITE_OTHER): Payer: Medicare Other | Admitting: Gastroenterology

## 2023-10-20 ENCOUNTER — Ambulatory Visit: Admitting: Anesthesiology

## 2023-10-21 ENCOUNTER — Ambulatory Visit: Admitting: Anesthesiology

## 2023-10-26 ENCOUNTER — Encounter: Payer: Self-pay | Admitting: Anesthesiology

## 2023-10-26 ENCOUNTER — Ambulatory Visit: Attending: Anesthesiology | Admitting: Anesthesiology

## 2023-10-26 VITALS — BP 143/76 | HR 60 | Temp 97.0°F | Resp 16 | Ht 59.0 in | Wt 127.0 lb

## 2023-10-26 DIAGNOSIS — M797 Fibromyalgia: Secondary | ICD-10-CM

## 2023-10-26 DIAGNOSIS — G894 Chronic pain syndrome: Secondary | ICD-10-CM

## 2023-10-26 DIAGNOSIS — M961 Postlaminectomy syndrome, not elsewhere classified: Secondary | ICD-10-CM | POA: Diagnosis present

## 2023-10-26 DIAGNOSIS — M7918 Myalgia, other site: Secondary | ICD-10-CM | POA: Diagnosis present

## 2023-10-26 DIAGNOSIS — M545 Low back pain, unspecified: Secondary | ICD-10-CM | POA: Insufficient documentation

## 2023-10-26 DIAGNOSIS — G8929 Other chronic pain: Secondary | ICD-10-CM

## 2023-10-26 DIAGNOSIS — M5431 Sciatica, right side: Secondary | ICD-10-CM

## 2023-10-26 DIAGNOSIS — M542 Cervicalgia: Secondary | ICD-10-CM

## 2023-10-26 DIAGNOSIS — M48062 Spinal stenosis, lumbar region with neurogenic claudication: Secondary | ICD-10-CM | POA: Diagnosis present

## 2023-10-26 DIAGNOSIS — Q762 Congenital spondylolisthesis: Secondary | ICD-10-CM | POA: Diagnosis present

## 2023-10-26 DIAGNOSIS — M5481 Occipital neuralgia: Secondary | ICD-10-CM | POA: Diagnosis present

## 2023-10-26 DIAGNOSIS — F1190 Opioid use, unspecified, uncomplicated: Secondary | ICD-10-CM | POA: Diagnosis present

## 2023-10-26 MED ORDER — DEXAMETHASONE SODIUM PHOSPHATE 10 MG/ML IJ SOLN
10.0000 mg | Freq: Once | INTRAMUSCULAR | Status: AC
  Administered 2023-10-26: 10 mg

## 2023-10-26 MED ORDER — HYDROCODONE-ACETAMINOPHEN 5-325 MG PO TABS: 1.0000 | ORAL_TABLET | Freq: Two times a day (BID) | ORAL | 0 refills | Status: AC

## 2023-10-26 MED ORDER — ROPIVACAINE HCL 2 MG/ML IJ SOLN
10.0000 mL | Freq: Once | INTRAMUSCULAR | Status: AC
  Administered 2023-10-26: 10 mL via EPIDURAL

## 2023-10-26 MED ORDER — TRAMADOL HCL 50 MG PO TABS: 100.0000 mg | ORAL_TABLET | Freq: Three times a day (TID) | ORAL | 1 refills | Status: AC

## 2023-10-26 MED ORDER — ROPIVACAINE HCL 2 MG/ML IJ SOLN
INTRAMUSCULAR | Status: AC
Start: 2023-10-26 — End: ?
  Filled 2023-10-26: qty 20

## 2023-10-26 MED ORDER — DEXAMETHASONE SODIUM PHOSPHATE 10 MG/ML IJ SOLN
INTRAMUSCULAR | Status: AC
  Filled 2023-10-26: qty 1

## 2023-10-26 NOTE — Progress Notes (Signed)
 Nursing Pain Medication Assessment:  Safety precautions to be maintained throughout the outpatient stay will include: orient to surroundings, keep bed in low position, maintain call bell within reach at all times, provide assistance with transfer out of bed and ambulation.  Medication Inspection Compliance: Pill count conducted under aseptic conditions, in front of the patient. Neither the pills nor the bottle was removed from the patient's sight at any time. Once count was completed pills were immediately returned to the patient in their original bottle.  Medication: Tramadol  (Ultram ) Pill/Patch Count: 0 of 180 pills/patches remain Pill/Patch Appearance: Markings consistent with prescribed medication Bottle Appearance: Standard pharmacy container. Clearly labeled. Filled Date: [id number] Last Medication intake:  Today   Hydrocodone  1/45 Filled 07/15/2023 today

## 2023-10-26 NOTE — Patient Instructions (Signed)

## 2023-10-27 ENCOUNTER — Telehealth: Payer: Self-pay | Admitting: *Deleted

## 2023-10-27 NOTE — Telephone Encounter (Signed)
 Post procedure call; spoke with patient's daughter, Wallene Gum, no issues.

## 2023-10-27 NOTE — Progress Notes (Signed)
 Subjective:  Patient ID: [name], female    DOB: [birth year]  Age: 83 y.o. MRN: [id number]  CC: Headache, Neck Pain, and Shoulder Pain   Procedure: Bilateral greater occipital nerve block  HPI [name] presents for reevaluation.  She is presents with her daughter.  [name] has had bilateral greater simple nerve blocks in the past for her headaches and these have worked well for her.  Her last injection was back in February and the daughter and patient report that she had about 2-1/2 months of relief of the severe headaches with tension component or associated concomitant migraine.  The quality of the headaches that she would experience were less frequent and much less severe and she feels that she has done well with these.  She is also taking chronic opioid therapy and this is working well for her osteoarthritic pains and neck pain.  She takes her tramadol  2 tablets 3 times a day and generally reserves her Vicodin for 1 or 2 tablets at bedtime as needed to help with pain relief at night.  Otherwise she is unable to sleep and has severe pain in her back and neck.  The quality characteristic and distribution of that pain is stable in nature and with no recent changes noted.  Outpatient Medications Prior to Visit  Medication Sig Dispense Refill   acetaminophen  (TYLENOL ) 500 MG tablet Take 500 mg by mouth every 6 (six) hours as needed for mild pain.      ALPRAZolam  (XANAX  XR) 1 MG 24 hr tablet Take 0.5-1 mg by mouth as needed for sleep (Anxiety).      ascorbic acid (VITAMIN C) 500 MG tablet Take 1,000 mg by mouth daily.     cholecalciferol (VITAMIN D3) 10 MCG (400 UNIT) TABS tablet Take 5,000 Units by mouth daily.     cyanocobalamin  (VITAMIN B12) 1000 MCG tablet Take 1,000 mcg by mouth daily.     Dapagliflozin Propanediol (FARXIGA PO) Take 10 mg by mouth daily at 6 (six) AM.     dicyclomine  (BENTYL ) 10 MG capsule Take 1 capsule (10 mg total) by mouth every 12 (twelve) hours as needed for  spasms (abdominal pain/discomfort). (Patient not taking: Reported on 08/27/2023) 60 capsule 2   Docusate Sodium  (DSS) 100 MG CAPS Take 100 mg by mouth daily as needed.     Erenumab-aooe (AIMOVIG [place]) Inject into the skin every 30 (thirty) days.     Evolocumab (REPATHA [place]) Inject into the skin. Every two weeks.     fluticasone  (FLONASE ) 50 MCG/ACT nasal spray Place 2 sprays into both nostrils daily.     gabapentin (NEURONTIN) 100 MG capsule Take 2 capsules by mouth 2 (two) times daily.     HYDROcodone -acetaminophen  (NORCO/VICODIN) 5-325 MG tablet Take 1 tablet by mouth 2 (two) times daily. 45 tablet 0   hydrocortisone  (ANUSOL -HC) 2.5 % rectal cream APPLY 1 APPLICATION RECTALLY TWICE DAILY. 30 g 1   Insulin  Glargine-Lixisenatide (SOLIQUA [place]) Inject 15 Units into the skin in the morning.     LINZESS  72 MCG capsule TAKE ONE CAPSULE BY MOUTH ONCE DAILY. 30 capsule 11   lisinopril  (ZESTRIL ) 5 MG tablet Take 5 mg by mouth daily.     loperamide  (IMODIUM ) 2 MG capsule Take 1 capsule (2 mg total) by mouth 2 (two) times daily as needed for diarrhea or loose stools.     Milnacipran HCl (SAVELLA) 100 MG TABS tablet Take 100 mg by mouth 2 (two) times daily.     Multiple  Vitamin (MULTIVITAMIN WITH MINERALS) TABS tablet Take 1 tablet by mouth daily.     naloxegol  oxalate (MOVANTIK ) 12.5 MG TABS tablet Take 1 tablet (12.5 mg total) by mouth daily. 60 tablet 1   omeprazole  (PRILOSEC) 40 MG capsule Take 1 capsule (40 mg total) by mouth 2 (two) times daily. 60 capsule 3   polyethylene glycol powder (GLYCOLAX /MIRALAX ) 17 GM/SCOOP powder Take 0.5 Containers by mouth daily as needed for mild constipation.     polyvinyl alcohol (LIQUIFILM TEARS) 1.4 % ophthalmic solution Place 1 drop into both eyes 3 (three) times daily as needed for dry eyes.     promethazine  (PHENERGAN ) 12.5 MG tablet Take 12.5 mg by mouth every 8 (eight) hours as needed.     rizatriptan (MAXALT-MLT) 10 MG disintegrating tablet Take 10 mg by mouth as  needed for migraine. May repeat in 2 hours if needed     silodosin (RAPAFLO) 8 MG CAPS capsule Take 8 mg by mouth at bedtime.      tiZANidine (ZANAFLEX) 2 MG tablet Take 2 mg by mouth 3 (three) times daily as needed.     topiramate  (TOPAMAX ) 100 MG tablet Take 1 tablet (100 mg total) by mouth 2 (two) times daily. 60 tablet 5   Travoprost, BAK Free, (TRAVATAN) 0.004 % SOLN ophthalmic solution Place 1 drop into both eyes at bedtime.      No facility-administered medications prior to visit.    Review of Systems CNS: No confusion or sedation Cardiac: No angina or palpitations GI: No abdominal pain or constipation Constitutional: No nausea vomiting fevers or chills  Objective:  BP (!) 143/76   Pulse 60   Temp (!) 97 F (36.1 C)   Resp 16   Ht 4\' 11"  (1.499 m)   Wt 127 lb (57.6 kg)   SpO2 99%   BMI 25.65 kg/m    BP Readings from Last 3 Encounters:  10/26/23 (!) 143/76  08/27/23 (!) 161/82  08/03/23 (!) 160/68     Wt Readings from Last 3 Encounters:  10/26/23 127 lb (57.6 kg)  08/27/23 140 lb (63.5 kg)  08/03/23 130 lb (59 kg)     Physical Exam Pt is alert and oriented PERRL EOMI HEART IS RRR no murmur or rub LCTA no wheezing or rales MUSCULOSKELETAL reveals tenderness over the greater occipital notch in the back.  She has limited range of motion secondary to pain on extension and flexion of the neck with lateral rotation at the neck limited.  She has tenderness throughout the trapezius muscle bilaterally and over the splenis capitis muscles bilaterally.  She has generalized pain throughout the lower back.  She has limited range of motion and ability to ambulate is restricted with an antalgic gait.  Labs  No results found for: "HGBA1C" Lab Results  Component Value Date   CREATININE 0.89 04/21/2023    -------------------------------------------------------------------------------------------------------------------- Lab Results  Component Value Date   WBC 8.4  06/18/2023   HGB 12.6 06/18/2023   HCT 40.7 06/18/2023   PLT 223 06/18/2023   GLUCOSE 207 (H) 04/21/2023   ALT 42 (H) 04/21/2023   AST 13 04/21/2023   NA 140 04/21/2023   K 3.6 04/21/2023   CL 103 04/21/2023   CREATININE 0.89 04/21/2023   BUN 14 04/21/2023   CO2 28 04/21/2023   TSH 1.31 12/25/2015   INR 1.03 04/19/2013    --------------------------------------------------------------------------------------------------------------------- No results found.   Assessment & Plan:   [name] was seen today for headache, neck pain and shoulder pain.  Diagnoses and all orders for this visit:  Spinal stenosis of lumbar region with neurogenic claudication  Occipital neuralgia of left side  Chronic bilateral low back pain without sciatica  Sciatica of right side  Cervicalgia  SPONDYLOLITHESIS  Fibromyalgia  Cervical myofascial pain syndrome  Cervical post-laminectomy syndrome  Chronic pain syndrome -     ToxASSURE [place] 13 (MW), Urine  Chronic, continuous use of opioids -     ToxASSURE Select 13 (MW), Urine  Other orders -     traMADol  (ULTRAM ) 50 MG tablet; Take 2 tablets (100 mg total) by mouth 3 (three) times daily. -     HYDROcodone -acetaminophen  (NORCO/VICODIN) 5-325 MG tablet; Take 1 tablet by mouth 2 (two) times daily for 23 days. -     HYDROcodone -acetaminophen  (NORCO/VICODIN) 5-325 MG tablet; Take 1 tablet by mouth 2 (two) times daily. -     dexamethasone  (DECADRON ) injection 10 mg -     ropivacaine  (PF) 2 mg/mL (0.2%) (NAROPIN ) injection 10 mL        ----------------------------------------------------------------------------------------------------------------------  Problem List Items Addressed This Visit       Unprioritized   Cervical post-laminectomy syndrome   Chronic low back pain   Relevant Medications   traMADol  (ULTRAM ) 50 MG tablet   HYDROcodone -acetaminophen  (NORCO/VICODIN) 5-325 MG tablet   HYDROcodone -acetaminophen  (NORCO/VICODIN)  5-325 MG tablet (Start on 11/25/2023)   Chronic pain syndrome   Relevant Medications   traMADol  (ULTRAM ) 50 MG tablet   HYDROcodone -acetaminophen  (NORCO/VICODIN) 5-325 MG tablet   HYDROcodone -acetaminophen  (NORCO/VICODIN) 5-325 MG tablet (Start on 11/25/2023)   Other Relevant Orders   ToxASSURE Select 13 (MW), Urine   Fibromyalgia   Relevant Medications   traMADol  (ULTRAM ) 50 MG tablet   HYDROcodone -acetaminophen  (NORCO/VICODIN) 5-325 MG tablet   HYDROcodone -acetaminophen  (NORCO/VICODIN) 5-325 MG tablet (Start on 11/25/2023)   Sciatica of right side   Spinal stenosis of lumbar region - Primary   SPONDYLOLITHESIS   Other Visit Diagnoses       Occipital neuralgia of left side       Relevant Medications   traMADol  (ULTRAM ) 50 MG tablet   HYDROcodone -acetaminophen  (NORCO/VICODIN) 5-325 MG tablet   HYDROcodone -acetaminophen  (NORCO/VICODIN) 5-325 MG tablet (Start on 11/25/2023)   ropivacaine  (PF) 2 mg/mL (0.2%) (NAROPIN ) injection 10 mL (Completed)     Cervicalgia         Cervical myofascial pain syndrome       Relevant Medications   traMADol  (ULTRAM ) 50 MG tablet   HYDROcodone -acetaminophen  (NORCO/VICODIN) 5-325 MG tablet   HYDROcodone -acetaminophen  (NORCO/VICODIN) 5-325 MG tablet (Start on 11/25/2023)   dexamethasone  (DECADRON ) injection 10 mg (Completed)   ropivacaine  (PF) 2 mg/mL (0.2%) (NAROPIN ) injection 10 mL (Completed)     Chronic, continuous use of opioids       Relevant Orders   ToxASSURE Select 13 (MW), Urine         ----------------------------------------------------------------------------------------------------------------------  1. Spinal stenosis of lumbar region with neurogenic claudication (Primary) Continue core stretching strengthening as tolerated and continue current pain medication for relief which is working well.  She generally gets about 75% relief with the Vicodin at night and about 45 to 50% relief with the tramadol  during the day.  2. Occipital  neuralgia of left side Will proceed with a greater occipital nerve block today for bilateral greater simple pain which are working well for her tension headaches and migraine headaches as well as generalized neck pain and spasming.  3. Chronic bilateral low back pain without sciatica As above  4. Sciatica of right side As above  5. Cervicalgia As above  6. SPONDYLOLITHESIS   7. Fibromyalgia   8. Cervical myofascial pain syndrome As above  9. Cervical post-laminectomy syndrome Continue current medication management  10. Chronic pain syndrome I have reviewed the [place]  practitioner database information is appropriate for refills for the next 2 months dated from May 19 and June 18 with refill of her tramadol  as well and continue taking medications as she is doing - [employer] 13 (MW), Urine  11. Chronic, continuous use of opioids As above and continue follow-up with her primary care physician for baseline medical care with return to clinic scheduled in 2 months - ToxASSURE [facility] 13 (MW), Urine    ----------------------------------------------------------------------------------------------------------------------  I am having [name] start on traMADol , HYDROcodone -acetaminophen , and HYDROcodone -acetaminophen . I am also having her maintain her ALPRAZolam , acetaminophen , silodosin, topiramate , Travoprost (BAK Free), multivitamin with minerals, polyvinyl alcohol, lisinopril , hydrocortisone , loperamide , Dapagliflozin Propanediol (FARXIGA PO), dicyclomine , Erenumab-aooe (AIMOVIG [place]), Evolocumab (REPATHA [place]), Insulin  Glargine-Lixisenatide (SOLIQUA [place]), gabapentin, rizatriptan, cyanocobalamin , cholecalciferol, Milnacipran HCl, DSS, ascorbic acid, fluticasone , polyethylene glycol powder, promethazine , tiZANidine, HYDROcodone -acetaminophen , Linzess , naloxegol  oxalate, and omeprazole . We administered dexamethasone  and ropivacaine  (PF) 2 mg/mL (0.2%).   Meds ordered  this encounter  Medications   traMADol  (ULTRAM ) 50 MG tablet    Sig: Take 2 tablets (100 mg total) by mouth 3 (three) times daily.    Dispense:  180 tablet    Refill:  1   HYDROcodone -acetaminophen  (NORCO/VICODIN) 5-325 MG tablet    Sig: Take 1 tablet by mouth 2 (two) times daily for 23 days.    Dispense:  45 tablet    Refill:  0   HYDROcodone -acetaminophen  (NORCO/VICODIN) 5-325 MG tablet    Sig: Take 1 tablet by mouth 2 (two) times daily.    Dispense:  45 tablet    Refill:  0   dexamethasone  (DECADRON ) injection 10 mg   ropivacaine  (PF) 2 mg/mL (0.2%) (NAROPIN ) injection 10 mL   Patient's Medications  New Prescriptions   HYDROCODONE -ACETAMINOPHEN  (NORCO/VICODIN) 5-325 MG TABLET    Take 1 tablet by mouth 2 (two) times daily for 23 days.   HYDROCODONE -ACETAMINOPHEN  (NORCO/VICODIN) 5-325 MG TABLET    Take 1 tablet by mouth 2 (two) times daily.   TRAMADOL  (ULTRAM ) 50 MG TABLET    Take 2 tablets (100 mg total) by mouth 3 (three) times daily.  Previous Medications   ACETAMINOPHEN  (TYLENOL ) 500 MG TABLET    Take 500 mg by mouth every 6 (six) hours as needed for mild pain.    ALPRAZOLAM  (XANAX  XR) 1 MG 24 HR TABLET    Take 0.5-1 mg by mouth as needed for sleep (Anxiety).    ASCORBIC ACID (VITAMIN C) 500 MG TABLET    Take 1,000 mg by mouth daily.   CHOLECALCIFEROL (VITAMIN D3) 10 MCG (400 UNIT) TABS TABLET    Take 5,000 Units by mouth daily.   CYANOCOBALAMIN  (VITAMIN B12) 1000 MCG TABLET    Take 1,000 mcg by mouth daily.   DAPAGLIFLOZIN PROPANEDIOL (FARXIGA PO)    Take 10 mg by mouth daily at 6 (six) AM.   DICYCLOMINE  (BENTYL ) 10 MG CAPSULE    Take 1 capsule (10 mg total) by mouth every 12 (twelve) hours as needed for spasms (abdominal pain/discomfort).   DOCUSATE SODIUM  (DSS) 100 MG CAPS    Take 100 mg by mouth daily as needed.   ERENUMAB-AOOE (AIMOVIG [name])    Inject into the skin every 30 (thirty) days.   EVOLOCUMAB (REPATHA [place])    Inject into  the skin. Every two weeks.   FLUTICASONE   (FLONASE ) 50 MCG/ACT NASAL SPRAY    Place 2 sprays into both nostrils daily.   GABAPENTIN (NEURONTIN) 100 MG CAPSULE    Take 2 capsules by mouth 2 (two) times daily.   HYDROCODONE -ACETAMINOPHEN  (NORCO/VICODIN) 5-325 MG TABLET    Take 1 tablet by mouth 2 (two) times daily.   HYDROCORTISONE  (ANUSOL -HC) 2.5 % RECTAL CREAM    APPLY 1 APPLICATION RECTALLY TWICE DAILY.   INSULIN  GLARGINE-LIXISENATIDE (SOLIQUA [place])    Inject 15 Units into the skin in the morning.   LINZESS  72 MCG CAPSULE    TAKE ONE CAPSULE BY MOUTH ONCE DAILY.   LISINOPRIL  (ZESTRIL ) 5 MG TABLET    Take 5 mg by mouth daily.   LOPERAMIDE  (IMODIUM ) 2 MG CAPSULE    Take 1 capsule (2 mg total) by mouth 2 (two) times daily as needed for diarrhea or loose stools.   MILNACIPRAN HCL (SAVELLA) 100 MG TABS TABLET    Take 100 mg by mouth 2 (two) times daily.   MULTIPLE VITAMIN (MULTIVITAMIN WITH MINERALS) TABS TABLET    Take 1 tablet by mouth daily.   NALOXEGOL  OXALATE (MOVANTIK ) 12.5 MG TABS TABLET    Take 1 tablet (12.5 mg total) by mouth daily.   OMEPRAZOLE  (PRILOSEC) 40 MG CAPSULE    Take 1 capsule (40 mg total) by mouth 2 (two) times daily.   POLYETHYLENE GLYCOL POWDER (GLYCOLAX /MIRALAX ) 17 GM/SCOOP POWDER    Take 0.5 Containers by mouth daily as needed for mild constipation.   POLYVINYL ALCOHOL (LIQUIFILM TEARS) 1.4 % OPHTHALMIC SOLUTION    Place 1 drop into both eyes 3 (three) times daily as needed for dry eyes.   PROMETHAZINE  (PHENERGAN ) 12.5 MG TABLET    Take 12.5 mg by mouth every 8 (eight) hours as needed.   RIZATRIPTAN (MAXALT-MLT) 10 MG DISINTEGRATING TABLET    Take 10 mg by mouth as needed for migraine. May repeat in 2 hours if needed   SILODOSIN (RAPAFLO) 8 MG CAPS CAPSULE    Take 8 mg by mouth at bedtime.    TIZANIDINE (ZANAFLEX) 2 MG TABLET    Take 2 mg by mouth 3 (three) times daily as needed.   TOPIRAMATE  (TOPAMAX ) 100 MG TABLET    Take 1 tablet (100 mg total) by mouth 2 (two) times daily.   TRAVOPROST, BAK FREE, (TRAVATAN) 0.004  % SOLN OPHTHALMIC SOLUTION    Place 1 drop into both eyes at bedtime.   Modified Medications   No medications on file  Discontinued Medications   No medications on file   ----------------------------------------------------------------------------------------------------------------------  Follow-up: Return in about 30 years (around 10/25/2053) for evaluation, procedure.   Greater occipital nerve block on the bilateral side , blood pressure, pulse, pulse oximetry monitoring. Greater occipital nerve block on the left and right side. Following identification of the nuchal ridge, before-gauge needle was inserted at the level of the nuchal ridge medial to the occipital artery.  Following negative aspiration, 4cc 0.2% ropivacaine  with 4 mg of Decadron  injected for left greater occipital nerve block.  Needle was removed.   The same procedure was performed on the right side as above.  There was once again negative aspiration and a 24-gauge needle was used to infiltrate 4 cc of the same mixture on the right side.  Patient tolerated injection well.  Zula Hitch, MD

## 2023-10-30 LAB — TOXASSURE SELECT 13 (MW), URINE

## 2023-11-12 ENCOUNTER — Ambulatory Visit: Payer: Medicare Other | Admitting: Urology

## 2023-11-26 ENCOUNTER — Encounter: Payer: Self-pay | Admitting: Urology

## 2023-11-26 ENCOUNTER — Ambulatory Visit: Admitting: Urology

## 2023-11-26 VITALS — BP 143/86 | HR 90

## 2023-11-26 DIAGNOSIS — R339 Retention of urine, unspecified: Secondary | ICD-10-CM

## 2023-11-26 DIAGNOSIS — R8281 Pyuria: Secondary | ICD-10-CM

## 2023-11-26 DIAGNOSIS — N398 Other specified disorders of urinary system: Secondary | ICD-10-CM

## 2023-11-26 DIAGNOSIS — R39198 Other difficulties with micturition: Secondary | ICD-10-CM | POA: Diagnosis not present

## 2023-11-26 DIAGNOSIS — M6289 Other specified disorders of muscle: Secondary | ICD-10-CM

## 2023-11-26 LAB — URINALYSIS, ROUTINE W REFLEX MICROSCOPIC
Bilirubin, UA: NEGATIVE
Ketones, UA: NEGATIVE
Nitrite, UA: NEGATIVE
Protein,UA: NEGATIVE
RBC, UA: NEGATIVE
Specific Gravity, UA: <1.005 — ABNORMAL LOW (ref 1.005–1.030)
Urobilinogen, Ur: 0.2 mg/dL (ref 0.2–1.0)
pH, UA: 6.5 (ref 5.0–7.5)

## 2023-11-26 LAB — MICROSCOPIC EXAMINATION

## 2023-11-26 NOTE — Progress Notes (Signed)
 Bladder Scan completed today.  Patient can void prior to the bladder scan. Bladder scan result: [id number]  Performed By: Melvenia Stabs. CMA  Additional notes-

## 2023-11-26 NOTE — Progress Notes (Unsigned)
 Subjective:  1. Pelvic floor dysfunction   2. Voiding dysfunction   3. Incomplete bladder emptying      11/26/23: Brizza returns today in f/u.  She was found to have a trabeculated bladder without lesions on cystocopy in Feburary.   She remains on silodosin.  Her PVR is .  Her UA has 11-30 WBC and a few bacteria.  She has no dysuria or incontinence.   She has variable frequency and nocturia 1-2x.   07/23/23: Thalia returns today in f/u for cystoscopy for the recent CT findings of some bladder wall irregularity.  She tried the valium suppositories but didn't notice much benefit.  She remains on silodosin but is off of duloxetine .  She has been started on hydrocodone  for her back.   11/20/22: Tyeesha returns today in f/u for her history of incomplete emptying with dysfunctional voiding. She has hesitancy with the urge.   She reports progressive voiding difficulty and her PVR is .  She has no hematuria or dysuria.  She remains on Silodosin but is also still on Duloxetine .     08/21/22: Lya was being seen by Dr. Willye Harvey for incomplete bladder emptying and has been on silodosin and PT for pelvic floor dyssynergia in the past.  She had urodynamics in 7/23 with the results noted below.  Her UA is clear today and her PVR is .  She has variable symptoms and can have small voids.  She is on duloxetine .   01/15/22: NICKOL COLLISTER is a 83 y.o. year old female who is seen for further evaluation of urinary symptoms.  She is followed by Dr.Bhutani for chronic kidney disease.  Her renal function is stable with a creatinine of 1.04 on 11/14/21.  She reported difficulty initiating bladder emptying at her recent visit.  She has been followed by Dr. Clarke Crouch at [facility] with her last visit in December 2022.  She has been managed for her bladder symptoms with Rapaflo and physical therapy for pelvic floor dyssynergia.  She was restarted on her Rapaflo at her last visit in 12/22.   She reported continued  symptoms of urinary hesitancy, straining, and sensation of incomplete emptying.  She voids 2- 4 times/day.  She continued on Rapaflo. Pelvic exam demonstrated a grade 2 cystocele.  I&O cath  showed a volume of 200 mL. Urodynamics from 01/03/2022 demonstrated a max capacity of 633 mL with normal sensation.  No evidence of stress incontinence or instability.  She was able to generate a voluntary contraction and void 316 mL with a max flow rate of 16 mL/s.  EMG leads required during voiding.  PVR was 316 mL.  No significant descent of the bladder was     ROS:  ROS:  A complete review of systems was performed.  All systems are negative except for pertinent findings as noted.   Review of Systems  Constitutional:  Positive for malaise/fatigue.  HENT:  Positive for congestion.   Respiratory:  Positive for cough.   Cardiovascular:  Positive for leg swelling.  Gastrointestinal:  Positive for constipation, diarrhea, heartburn, nausea and vomiting.  Musculoskeletal:  Positive for back pain and joint pain.  Neurological:  Positive for dizziness, weakness and headaches.  Endo/Heme/Allergies:  Positive for polydipsia. Bruises/bleeds easily.  Psychiatric/Behavioral:  Positive for depression and memory loss. The patient is nervous/anxious.     Allergies  Allergen Reactions   Botox [Onabotulinumtoxina]     Patient states that it became loose in her stomach , and it caused multiple  problems.   Morphine Anaphylaxis and Other (See Comments)    it will kill me made me stop breathing   Aspirin  Other (See Comments)    Gi bleed    Cefuroxime  Axetil Nausea And Vomiting   Codeine Nausea And Vomiting   Diltiazem  Nausea And Vomiting   Dronabinol Nausea And Vomiting and Palpitations   Shellfish Allergy Other (See Comments)    Blood sugar drops   Other     Mold and trees   Ondansetron  Hives   Penicillins Rash    Did it involve swelling of the face/tongue/throat, SOB, or low BP? No Did it involve  sudden or severe rash/hives, skin peeling, or any reaction on the inside of your mouth or nose? Yes Did you need to seek medical attention at a hospital or doctor's office? Yes When did it last happen?  Over 10 years  If all above answers are "NO", may proceed with cephalosporin use.     Outpatient Encounter Medications as of 11/26/2023  Medication Sig   acetaminophen  (TYLENOL ) 500 MG tablet Take 500 mg by mouth every 6 (six) hours as needed for mild pain.    ALPRAZolam  (XANAX  XR) 1 MG 24 hr tablet Take 0.5-1 mg by mouth as needed for sleep (Anxiety).    ascorbic acid (VITAMIN C) 500 MG tablet Take 1,000 mg by mouth daily.   cholecalciferol (VITAMIN D3) 10 MCG (400 UNIT) TABS tablet Take 5,000 Units by mouth daily.   cyanocobalamin  (VITAMIN B12) 1000 MCG tablet Take 1,000 mcg by mouth daily.   Dapagliflozin Propanediol (FARXIGA PO) Take 10 mg by mouth daily at 6 (six) AM.   Docusate Sodium  (DSS) 100 MG CAPS Take 100 mg by mouth daily as needed.   Erenumab-aooe (AIMOVIG [place]) Inject into the skin every 30 (thirty) days.   Evolocumab (REPATHA [place]) Inject into the skin. Every two weeks.   fluticasone  (FLONASE ) 50 MCG/ACT nasal spray Place 2 sprays into both nostrils daily.   gabapentin (NEURONTIN) 100 MG capsule Take 2 capsules by mouth 2 (two) times daily.   HYDROcodone -acetaminophen  (NORCO/VICODIN) 5-325 MG tablet Take 1 tablet by mouth 2 (two) times daily.   HYDROcodone -acetaminophen  (NORCO/VICODIN) 5-325 MG tablet Take 1 tablet by mouth 2 (two) times daily.   hydrocortisone  (ANUSOL -HC) 2.5 % rectal cream APPLY 1 APPLICATION RECTALLY TWICE DAILY.   Insulin  Glargine-Lixisenatide (SOLIQUA ) Inject 15 Units into the skin in the morning.   LINZESS  72 MCG capsule TAKE ONE CAPSULE BY MOUTH ONCE DAILY.   lisinopril  (ZESTRIL ) 5 MG tablet Take 5 mg by mouth daily.   loperamide  (IMODIUM ) 2 MG capsule Take 1 capsule (2 mg total) by mouth 2 (two) times daily as needed for diarrhea or loose stools.    Milnacipran HCl (SAVELLA) 100 MG TABS tablet Take 100 mg by mouth 2 (two) times daily.   Multiple Vitamin (MULTIVITAMIN WITH MINERALS) TABS tablet Take 1 tablet by mouth daily.   naloxegol  oxalate (MOVANTIK ) 12.5 MG TABS tablet Take 1 tablet (12.5 mg total) by mouth daily.   omeprazole  (PRILOSEC) 40 MG capsule Take 1 capsule (40 mg total) by mouth 2 (two) times daily.   polyethylene glycol powder (GLYCOLAX /MIRALAX ) 17 GM/SCOOP powder Take 0.5 Containers by mouth daily as needed for mild constipation.   polyvinyl alcohol (LIQUIFILM TEARS) 1.4 % ophthalmic solution Place 1 drop into both eyes 3 (three) times daily as needed for dry eyes.   promethazine  (PHENERGAN ) 12.5 MG tablet Take 12.5 mg by mouth every 8 (eight) hours as needed.  rizatriptan (MAXALT-MLT) 10 MG disintegrating tablet Take 10 mg by mouth as needed for migraine. May repeat in 2 hours if needed   silodosin (RAPAFLO) 8 MG CAPS capsule Take 8 mg by mouth at bedtime.    tiZANidine (ZANAFLEX) 2 MG tablet Take 2 mg by mouth 3 (three) times daily as needed.   topiramate  (TOPAMAX ) 100 MG tablet Take 1 tablet (100 mg total) by mouth 2 (two) times daily.   traMADol  (ULTRAM ) 50 MG tablet Take 2 tablets (100 mg total) by mouth 3 (three) times daily.   Travoprost, BAK Free, (TRAVATAN) 0.004 % SOLN ophthalmic solution Place 1 drop into both eyes at bedtime.    dicyclomine  (BENTYL ) 10 MG capsule Take 1 capsule (10 mg total) by mouth every 12 (twelve) hours as needed for spasms (abdominal pain/discomfort). (Patient not taking: Reported on 11/26/2023)   No facility-administered encounter medications on file as of 11/26/2023.    Past Medical History:  Diagnosis Date   Anemia    Anxiety    Arthritis    all over (04/19/2013)   Asthma    Bleeding stomach ulcer 02/08/1979   Bloating    Chronic back pain    Chronic headaches    Chronic heartburn    Chronic neck pain    Chronic pain    Colon cancer (HCC)    DDD (degenerative disc disease),  lumbar    Diabetic peripheral neuropathy (HCC)    in my feet (04/19/2013)   Dizziness    Dysphagia    Family history of anesthesia complication    daughter has PONV too (04/19/2013)   Fibromyalgia    Gastroesophageal reflux    Gastroparesis    Glaucoma, bilateral    Hiatal hernia    had it before; had OR; got it again (04/19/2013)   History of blood transfusion 02/08/1979   w/bleeding stomach ulcer (04/19/2013)   History of kidney stones    Migraine    take RX for it qd (04/19/2013)   Nausea    Pneumonia 04/09/2012   PONV (postoperative nausea and vomiting)    Renal insufficiency    TIA (transient ischemic attack)    3-4 before starting RX; none since (04/19/2013)   Type II diabetes mellitus (HCC)    Walking pneumonia ~ 1966    Past Surgical History:  Procedure Laterality Date   ABDOMINAL ADHESION SURGERY  ~ 2012   repaired wrap where they did hiatal hernia OR too 911/04/2013)   ANTERIOR CERVICAL DISCECTOMY  ~ 2009   only cleaned out arthritis and spurs (04/19/2013)   BIOPSY  01/09/2021   Procedure: BIOPSY;  Surgeon: Ruby Corporal, MD;  [facility]: [facility];  Service: Endoscopy;;  antrum; distal esophagus;   CATARACT EXTRACTION W/ INTRAOCULAR LENS IMPLANT Left 04/13/2013   CHOLECYSTECTOMY  1980's   COLON SURGERY     COLONOSCOPY  10/11/2010   COLONOSCOPY  11/23/2009   COLONOSCOPY  07/28/2008   W/SNARE   COLONOSCOPY  06/28/07   COLONOSCOPY  05/10/07   W/POLYP   COLONOSCOPY  12/28/00   COLONOSCOPY N/A 06/29/2015   Procedure: COLONOSCOPY;  Surgeon: Ruby Corporal, MD;  [facility]: [facility];  Service: Endoscopy;  Laterality: N/A;  135   COLONOSCOPY WITH ESOPHAGOGASTRODUODENOSCOPY (EGD) N/A 05/13/2013   Procedure: COLONOSCOPY WITH ESOPHAGOGASTRODUODENOSCOPY (EGD);  Surgeon: Ruby Corporal, MD;  [facility]: [facility];  Service: Endoscopy;  Laterality: N/A;  855   COLONOSCOPY WITH PROPOFOL  N/A 01/09/2021   Procedure: COLONOSCOPY WITH PROPOFOL ;   Surgeon:  Ruby Corporal, MD;  [facility]: [facility];  Service: Endoscopy;  Laterality: N/A;  12:50   ESOPHAGOGASTRODUODENOSCOPY (EGD) WITH PROPOFOL  N/A 10/06/2018   Procedure: ESOPHAGOGASTRODUODENOSCOPY (EGD) WITH PROPOFOL ;  Surgeon: Ruby Corporal, MD;  [facility]: [facility];  Service: Endoscopy;  Laterality: N/A;   ESOPHAGOGASTRODUODENOSCOPY (EGD) WITH PROPOFOL  N/A 01/09/2021   Procedure: ESOPHAGOGASTRODUODENOSCOPY (EGD) WITH PROPOFOL ;  Surgeon: Ruby Corporal, MD;  [facility];  Service: Endoscopy;  Laterality: N/A;   FLEXIBLE SIGMOIDOSCOPY N/A 06/23/2023   Procedure: FLEXIBLE SIGMOIDOSCOPY;  Surgeon: Urban Garden, MD;  [facility]: [facility];  Service: Gastroenterology;  Laterality: N/A;  9:30AM;ASA 3   HEMICOLECTOMY  2010   ZIEGLER   HIATAL HERNIA REPAIR  1970's   LEFT HEART CATHETERIZATION WITH CORONARY ANGIOGRAM N/A 04/19/2013   Procedure: LEFT HEART CATHETERIZATION WITH CORONARY ANGIOGRAM;  Surgeon: Peter M [place]and, MD;  Location[facility];  Service: Cardiovascular;  Laterality: N/A;   POLYPECTOMY  10/06/2018   Procedure: POLYPECTOMY;  Surgeon: Ruby Corporal, MD;  [facility];  Service: Endoscopy;;  gastric   POLYPECTOMY  01/09/2021   Procedure: POLYPECTOMY;  Surgeon: Ruby Corporal, MD;  [facility];  Service: Endoscopy;;  transverse;splenic flexure   POLYPECTOMY  06/23/2023   Procedure: POLYPECTOMY INTESTINAL;  Surgeon: Urban Garden, MD;  [facility]: [facility];  Service: Gastroenterology;;   POSTERIOR FUSION CERVICAL SPINE  1985   had a broken neck (04/19/2013)   TONSILLECTOMY  ~ 1953   TOOTH EXTRACTION Bilateral 12/04/2020   Procedure: BILATERAL TEMPOROMANDIBULAR JOINT ARTHROCENTESIS; DENTAL EXTRACTION TEETH #3,6,7,8,9,10,11,12,19,23,24,25,26,32 WITH ALVEOLOPLASTY;  Surgeon: Ascencion Lava, DMD;  Location: [facility];  Service: Oral Surgery;  Laterality: Bilateral;   UPPER GASTROINTESTINAL ENDOSCOPY  10/11/2010    EGD ED   UPPER GASTROINTESTINAL ENDOSCOPY  11/23/2009   UPPER GASTROINTESTINAL ENDOSCOPY  05/10/07   EGD ED   UPPER GASTROINTESTINAL ENDOSCOPY  08/11/01   EGD ED   VAGINAL HYSTERECTOMY      Social History   Socioeconomic History   Marital status: Widowed    Spouse name: Not on file   Number of children: Not on file   Years of education: Not on file   Highest education level: Not on file  Occupational History    Employer: RETIRED  Tobacco Use   Smoking status: Former    Current packs/day: 0.00    Average packs/day: 1.5 packs/day for 15.0 years (22.5 ttl pk-yrs)    Types: Cigarettes    Start date: 03/16/1976    Quit date: 03/17/1991    Years since quitting: 32.7    Passive exposure: Past   Smokeless tobacco: Never   Tobacco comments:    Patient states that it has ben greater than 20 years since she quit smoking  Vaping Use   Vaping status: Never Used  Substance and Sexual Activity   Alcohol use: No    Alcohol/week: 0.0 standard drinks of alcohol   Drug use: No   Sexual activity: Not Currently  Other Topics Concern   Not on file  Social History Narrative   Patient lives at home alone.    Patient is retired.    Patient is widowed.    Patient has 4 children.   Patient has a 11th grade education.          Social Drivers of Corporate investment banker Strain: Not on file  Food Insecurity: Not on file  Transportation Needs: Not on file  Physical Activity:  Not on file  Stress: Not on file  Social Connections: Not on file  Intimate Partner Violence: Not on file    Family History  Problem Relation Age of Onset   Heart disease Mother    Diabetes Mother    Dementia Father    Healthy Sister    Diabetes Brother    Kidney cancer Brother    Diabetes Brother    Neuropathy Brother    Diabetes Daughter    Diabetes Daughter    Diabetes Son        Objective: Vitals:   11/26/23 0959  BP: (!) 143/86  Pulse: 90      Physical Exam Vitals reviewed.   Constitutional:      Appearance: Normal appearance.   Neurological:     Mental Status: She is alert.     Lab Results:  PSA No results found for: PSA No results found for: TESTOSTERONE   Studies/Results: No results found. No results found.     Assessment & Plan: Dysfunctional voiding with hesitancy.   She has a stable PVR on silodosin which is filled by Dr. Clarke Crouch.   Pyuria.  She has few bacteria and no UTI symptosm.   No orders of the defined types were placed in this encounter.    Orders Placed This Encounter  Procedures   Urinalysis, Routine w reflex microscopic   BLADDER SCAN AMB NON-IMAGING      Return for She should continue to see Dr. Clarke Crouch in [place]  for her annual visit. .   CC: Sharyne Degree, FNP      Homero Luster 11/26/2023

## 2023-12-23 ENCOUNTER — Telehealth (INDEPENDENT_AMBULATORY_CARE_PROVIDER_SITE_OTHER): Payer: Self-pay

## 2023-12-23 ENCOUNTER — Other Ambulatory Visit (INDEPENDENT_AMBULATORY_CARE_PROVIDER_SITE_OTHER): Payer: Self-pay | Admitting: Gastroenterology

## 2023-12-23 MED ORDER — DEXLANSOPRAZOLE 60 MG PO CPDR: 60.0000 mg | DELAYED_RELEASE_CAPSULE | Freq: Every day | ORAL | 3 refills | Status: AC

## 2023-12-23 NOTE — Telephone Encounter (Signed)
Noted. Thanks,

## 2023-12-23 NOTE — Telephone Encounter (Signed)
 Patient last seen 08/27/2023 by [facility]. Reflux not controlled on Omeprazole , would like to go back to Dexlansoprazole . Insurance would not cover Voquezna . Please send in Dexlansoprazole  to the [facility] in [place]. Thanks,

## 2024-01-04 ENCOUNTER — Ambulatory Visit (INDEPENDENT_AMBULATORY_CARE_PROVIDER_SITE_OTHER): Admitting: Gastroenterology

## 2024-01-09 ENCOUNTER — Encounter: Payer: Self-pay | Admitting: Emergency Medicine

## 2024-01-09 ENCOUNTER — Ambulatory Visit: Admission: EM | Admit: 2024-01-09 | Discharge: 2024-01-09 | Disposition: A | Discharge status: Home / Self Care

## 2024-01-09 DIAGNOSIS — R198 Other specified symptoms and signs involving the digestive system and abdomen: Secondary | ICD-10-CM | POA: Diagnosis not present

## 2024-01-09 DIAGNOSIS — R1013 Epigastric pain: Secondary | ICD-10-CM | POA: Diagnosis not present

## 2024-01-09 DIAGNOSIS — Z8719 Personal history of other diseases of the digestive system: Secondary | ICD-10-CM

## 2024-01-09 MED ORDER — LIDOCAINE VISCOUS HCL 2 % MT SOLN
15.0000 mL | Freq: Once | OROMUCOSAL | Status: AC
  Administered 2024-01-09: 15 mL via OROMUCOSAL

## 2024-01-09 MED ORDER — DEXLANSOPRAZOLE 60 MG PO CPDR: 60.0000 mg | DELAYED_RELEASE_CAPSULE | Freq: Every day | ORAL | 0 refills | Status: DC

## 2024-01-09 MED ORDER — ALUM & MAG HYDROXIDE-SIMETH 200-200-20 MG/5ML PO SUSP
30.0000 mL | Freq: Once | ORAL | Status: AC
  Administered 2024-01-09: 30 mL via ORAL

## 2024-01-09 NOTE — Discharge Instructions (Signed)
 Take medication as prescribed. Increase fluids allow for plenty of rest.  Continue to encourage fluids.  Recommend Pedialyte or Gatorade to prevent dehydration. She may take over-the-counter Tylenol  as needed for pain, fever, or general discomfort. Dietary modifications to avoid reflux triggers to include avoidance of spicy foods, tomato-based foods, dairy, red meats, or caffeine. Make sure she is eating at least 2 to 3 hours before bedtime. Recommend having her sleep elevated while symptoms persist. Follow-up with GI as scheduled this week. Go to the emergency department immediately if she experiences worsening pain, new symptoms of fever, chills, nausea, vomiting, or other concerns. Follow-up as needed.

## 2024-01-09 NOTE — ED Provider Notes (Signed)
 [facility]    CSN: [id number] Arrival date & time: 01/09/24  1017      History   Chief Complaint No chief complaint on file.   HPI [name] is a 83 y.o. female.   The history is provided by the patient.   Patient brought in by her daughter for complaints of epigastric pain and heartburn.  Symptoms have been present for the past several days.  The patient's daughter states patient takes Nexium and Prilosec for her reflux symptoms, also states mother has history of peptic ulcer disease.  Patient states that the epigastric pain has been persistent.  She denies fever, chills, upper respiratory symptoms, chest pain, diarrhea, constipation, or urinary symptoms.  Patient states that she has also experienced episodes of nausea, but states this most likely is due to her reflux.  Daughter states patient has been unable to eat like she normally does, states that patient has been drinking fluids to include Coke.  Daughter states patient is scheduled to see GI this week.  Daughter states patient is requesting to be changed back to Dexilant  instead of the Prilosec for her reflux symptoms.  Past Medical History:  Diagnosis Date   Anemia    Anxiety    Arthritis    all over (04/19/2013)   Asthma    Bleeding stomach ulcer 02/08/1979   Bloating    Chronic back pain    Chronic headaches    Chronic heartburn    Chronic neck pain    Chronic pain    Colon cancer (HCC)    DDD (degenerative disc disease), lumbar    Diabetic peripheral neuropathy (HCC)    in my feet (04/19/2013)   Dizziness    Dysphagia    Family history of anesthesia complication    daughter has PONV too (04/19/2013)   Fibromyalgia    Gastroesophageal reflux    Gastroparesis    Glaucoma, bilateral    Hiatal hernia    had it before; had OR; got it again (04/19/2013)   History of blood transfusion 02/08/1979   w/bleeding stomach ulcer (04/19/2013)   History of kidney stones    Migraine    take  RX for it qd (04/19/2013)   Nausea    Pneumonia 04/09/2012   PONV (postoperative nausea and vomiting)    Renal insufficiency    TIA (transient ischemic attack)    3-4 before starting RX; none since (04/19/2013)   Type II diabetes mellitus (HCC)    Walking pneumonia ~ 1966    Patient Active Problem List   Diagnosis Date Noted   Sciatica of right side 07/01/2023   Abnormal CT scan, gastrointestinal tract 06/23/2023   RUQ pain 04/22/2023   Constipation 04/22/2023   Nausea without vomiting 04/22/2023   Spinal stenosis of lumbar region 11/26/2022   Chronic pain syndrome 11/26/2022   Chronic low back pain 11/26/2022   IBS (irritable bowel syndrome) 02/17/2022   Pelvic floor dysfunction 12/06/2021   Cystocele, midline 12/06/2021   Diarrhea    Grade III hemorrhoids 07/09/2021   History of colon cancer    Dysphagia 09/29/2018   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   Other cervical disc displacement, cervicothoracic region 09/08/2018   Closed nondisplaced fracture of body of left hamate bone 07/25/18 09/06/2018   Cervical post-laminectomy syndrome 01/19/2017   Post-concussion headache 10/08/2016   Abdominal pain 07/06/2016   Post concussion syndrome 07/02/2016   Anxiety 06/24/2016   Incomplete bladder emptying 06/24/2016   Skull fracture (HCC) 02/03/2016   Occipital  fracture (HCC) 02/02/2016   Lactic acidosis    Ureteral calculus, right    Nephrolithiasis 12/05/2014   Elevated lactic acid level 12/05/2014   Hx-TIA (transient ischemic attack) 08/10/2014   Body mass index (BMI) of 25.0-25.9 in adult 07/20/2014   Unstable angina (HCC) 04/19/2013   History of GI bleed 04/19/2013   Fibromyalgia 04/19/2013   Gastroesophageal reflux 04/19/2013   Coronary artery calcification seen on CAT scan 04/19/2013   Bradycardia 04/19/2013   History of anemia 12/02/2011   Diabetes mellitus (HCC) 08/06/2011   Iron  deficiency anemia 08/06/2011   History of colonic polyps 08/04/2011   Diabetic  gastroparesis (HCC) 08/04/2011   Achalasia 08/04/2011   Allergic rhinitis 08/11/2010   SPINAL STENOSIS 02/07/2008   SPONDYLOLYSIS 02/07/2008   SPONDYLOLITHESIS 02/07/2008    Past Surgical History:  Procedure Laterality Date   ABDOMINAL ADHESION SURGERY  ~ 2012   repaired wrap where they did hiatal hernia OR too 911/04/2013)   ANTERIOR CERVICAL DISCECTOMY  ~ 2009   only cleaned out arthritis and spurs (04/19/2013)   BIOPSY  01/09/2021   Procedure: BIOPSY;  Surgeon: Golda Claudis PENNER, MD;  [facility]: [facility];  Service: Endoscopy;;  antrum; distal esophagus;   CATARACT EXTRACTION W/ INTRAOCULAR LENS IMPLANT Left 04/13/2013   CHOLECYSTECTOMY  1980's   COLON SURGERY     COLONOSCOPY  10/11/2010   COLONOSCOPY  11/23/2009   COLONOSCOPY  07/28/2008   W/SNARE   COLONOSCOPY  06/28/07   COLONOSCOPY  05/10/07   W/POLYP   COLONOSCOPY  12/28/00   COLONOSCOPY N/A 06/29/2015   Procedure: COLONOSCOPY;  Surgeon: Claudis PENNER Golda, MD;  Location: [facility];  Service: Endoscopy;  Laterality: N/A;  135   COLONOSCOPY WITH ESOPHAGOGASTRODUODENOSCOPY (EGD) N/A 05/13/2013   Procedure: COLONOSCOPY WITH ESOPHAGOGASTRODUODENOSCOPY (EGD);  Surgeon: Claudis PENNER Golda, MD;  [facility]: [facility];  Service: Endoscopy;  Laterality: N/A;  855   COLONOSCOPY WITH PROPOFOL  N/A 01/09/2021   Procedure: COLONOSCOPY WITH PROPOFOL ;  Surgeon: Golda Claudis PENNER, MD;  [facility]: [facility];  Service: Endoscopy;  Laterality: N/A;  12:50   ESOPHAGOGASTRODUODENOSCOPY (EGD) WITH PROPOFOL  N/A 10/06/2018   Procedure: ESOPHAGOGASTRODUODENOSCOPY (EGD) WITH PROPOFOL ;  Surgeon: Golda Claudis PENNER, MD;  [facility]: [facility];  Service: Endoscopy;  Laterality: N/A;   ESOPHAGOGASTRODUODENOSCOPY (EGD) WITH PROPOFOL  N/A 01/09/2021   Procedure: ESOPHAGOGASTRODUODENOSCOPY (EGD) WITH PROPOFOL ;  Surgeon: Golda Claudis PENNER, MD;  [facility]: [facility];  Service: Endoscopy;  Laterality: N/A;   FLEXIBLE SIGMOIDOSCOPY N/A 06/23/2023   Procedure:  FLEXIBLE SIGMOIDOSCOPY;  Surgeon: Eartha Angelia Sieving, MD;  [facility]: [facility];  Service: Gastroenterology;  Laterality: N/A;  9:30AM;ASA 3   HEMICOLECTOMY  2010   ZIEGLER   HIATAL HERNIA REPAIR  1970's   LEFT HEART CATHETERIZATION WITH CORONARY ANGIOGRAM N/A 04/19/2013   Procedure: LEFT HEART CATHETERIZATION WITH CORONARY ANGIOGRAM;  Surgeon: Peter M [place], MD;  [facility] C[facility];  Service: Cardiovascular;  Laterality: N/A;   POLYPECTOMY  10/06/2018   Procedure: POLYPECTOMY;  Surgeon: Golda Claudis PENNER, MD;  [facility]: [facility];  Service: Endoscopy;;  gastric   POLYPECTOMY  01/09/2021   Procedure: POLYPECTOMY;  Surgeon: Golda Claudis PENNER, MD;  [facility]: [facility];  Service: Endoscopy;;  transverse;splenic flexure   POLYPECTOMY  06/23/2023   Procedure: POLYPECTOMY INTESTINAL;  Surgeon: Eartha Angelia Sieving, MD;  Location: [facility];  Service: Gastroenterology;;   POSTERIOR FUSION CERVICAL SPINE  1985   had a broken neck (04/19/2013)   TONSILLECTOMY  ~  1953   TOOTH EXTRACTION Bilateral 12/04/2020   Procedure: BILATERAL TEMPOROMANDIBULAR JOINT ARTHROCENTESIS; DENTAL EXTRACTION TEETH #3,6,7,8,9,10,11,12,19,23,24,25,26,32 WITH ALVEOLOPLASTY;  Surgeon: Sheryle Hamilton, DMD;  Location: [facility];  Service: Oral Surgery;  Laterality: Bilateral;   UPPER GASTROINTESTINAL ENDOSCOPY  10/11/2010   EGD ED   UPPER GASTROINTESTINAL ENDOSCOPY  11/23/2009   UPPER GASTROINTESTINAL ENDOSCOPY  05/10/07   EGD ED   UPPER GASTROINTESTINAL ENDOSCOPY  08/11/01   EGD ED   VAGINAL HYSTERECTOMY      OB History     Gravida  4   Para  4   Term  4   Preterm      AB      Living         SAB      IAB      Ectopic      Multiple      Live Births               Home Medications    Prior to Admission medications   Medication Sig Start Date End Date Taking? Authorizing Provider  dexlansoprazole  (DEXILANT ) 60 MG capsule Take 1 capsule (60 mg total) by mouth daily. 12/23/23    Carlan, Chelsea L, NP  esomeprazole (NEXIUM) 20 MG packet Take 40 mg by mouth daily before breakfast.   Yes [provider]  omeprazole  (PRILOSEC) 40 MG capsule Take 40 mg by mouth daily.   Yes [provider]  acetaminophen  (TYLENOL ) 500 MG tablet Take 500 mg by mouth every 6 (six) hours as needed for mild pain.     [provider]  ALPRAZolam  (XANAX  XR) 1 MG 24 hr tablet Take 0.5-1 mg by mouth as needed for sleep (Anxiety).     [provider]  ascorbic acid (VITAMIN C) 500 MG tablet Take 1,000 mg by mouth daily.    [provider]  cholecalciferol (VITAMIN D3) 10 MCG (400 UNIT) TABS tablet Take 5,000 Units by mouth daily.    [provider]  cyanocobalamin  (VITAMIN B12) 1000 MCG tablet Take 1,000 mcg by mouth daily.    [provider]  Dapagliflozin Propanediol (FARXIGA PO) Take 10 mg by mouth daily at 6 (six) AM.    [provider]  dicyclomine  (BENTYL ) 10 MG capsule Take 1 capsule (10 mg total) by mouth every 12 (twelve) hours as needed for spasms (abdominal pain/discomfort). Patient not taking: Reported on 11/26/2023 02/17/22   Castaneda Mayorga, Toribio, MD  Docusate Sodium  (DSS) 100 MG CAPS Take 100 mg by mouth daily as needed. 08/10/13   [provider]  Erenumab-aooe (AIMOVIG [place]) Inject into the skin every 30 (thirty) days.    [provider]  Evolocumab (REPATHA [place]) Inject into the skin. Every two weeks.    [provider]  fluticasone  (FLONASE ) 50 MCG/ACT nasal spray Place 2 sprays into both nostrils daily.    [provider]  gabapentin (NEURONTIN) 100 MG capsule Take 2 capsules by mouth 2 (two) times daily.    [provider]  HYDROcodone -acetaminophen  (NORCO/VICODIN) 5-325 MG tablet Take 1 tablet by mouth 2 (two) times daily. 07/15/23   Myra Lynwood MATSU, MD  hydrocortisone  (ANUSOL -HC) 2.5 % rectal cream APPLY 1 APPLICATION RECTALLY TWICE DAILY. 10/10/21   Shirlean Therisa ORN, NP   Insulin  Glargine-Lixisenatide [facility]) Inject 15 Units into the skin in the morning.    [provider]  LINZESS  72 MCG capsule TAKE ONE CAPSULE BY MOUTH ONCE DAILY. 08/17/23   Eartha Angelia Toribio, MD  lisinopril  (ZESTRIL ) 5 MG tablet Take 5 mg by mouth daily.    [provider]  loperamide  (IMODIUM ) 2 MG capsule Take 1 capsule (2 mg total) by mouth 2 (two) times daily as needed for diarrhea or loose stools. 10/15/21   Rehman, Claudis PENNER, MD  Milnacipran HCl (SAVELLA) 100 MG TABS tablet Take 100 mg by mouth 2 (two) times daily.    [provider]  Multiple Vitamin (MULTIVITAMIN WITH MINERALS) TABS tablet Take 1 tablet by mouth daily.    [provider]  naloxegol  oxalate (MOVANTIK ) 12.5 MG TABS tablet Take 1 tablet (12.5 mg total) by mouth daily. 08/27/23   Carlan, Chelsea L, NP  polyethylene glycol powder (GLYCOLAX /MIRALAX ) 17 GM/SCOOP powder Take 0.5 Containers by mouth daily as needed for mild constipation. 06/17/11   [provider]  polyvinyl alcohol (LIQUIFILM TEARS) 1.4 % ophthalmic solution Place 1 drop into both eyes 3 (three) times daily as needed for dry eyes.    [provider]  promethazine  (PHENERGAN ) 12.5 MG tablet Take 12.5 mg by mouth every 8 (eight) hours as needed.    [provider]  rizatriptan (MAXALT-MLT) 10 MG disintegrating tablet Take 10 mg by mouth as needed for migraine. May repeat in 2 hours if needed    [provider]  silodosin (RAPAFLO) 8 MG CAPS capsule Take 8 mg by mouth at bedtime.     [provider]  tiZANidine (ZANAFLEX) 2 MG tablet Take 2 mg by mouth 3 (three) times daily as needed.    [provider]  topiramate  (TOPAMAX ) 100 MG tablet Take 1 tablet (100 mg total) by mouth 2 (two) times daily. 10/08/16   Babs Arthea DASEN, MD  Travoprost, BAK Free, (TRAVATAN) 0.004 % SOLN ophthalmic solution Place 1 drop into both eyes at bedtime.  04/22/18   [provider]     Family History Family History  Problem Relation Age of Onset   Heart disease Mother    Diabetes Mother    Dementia Father    Healthy Sister    Diabetes Brother    Kidney cancer Brother    Diabetes Brother    Neuropathy Brother    Diabetes Daughter    Diabetes Daughter    Diabetes Son     Social History Social History   Tobacco Use   Smoking status: Former    Current packs/day: 0.00    Average packs/day: 1.5 packs/day for 15.0 years (22.5 ttl pk-yrs)    Types: Cigarettes    Start date: 03/16/1976    Quit date: 03/17/1991    Years since quitting: 32.8    Passive exposure: Past   Smokeless tobacco: Never   Tobacco comments:    Patient states that it has ben greater than 20 years since she quit smoking  Vaping Use   Vaping status: Never Used  Substance Use Topics   Alcohol use: No    Alcohol/week: 0.0 standard drinks of alcohol   Drug use: No     Allergies   Botox [onabotulinumtoxina], Morphine, Aspirin , Cefuroxime  axetil, Codeine, Diltiazem , Dronabinol, Shellfish allergy, Other, Ondansetron , and Penicillins   Review of Systems Review of Systems Per HPI  Physical Exam Triage Vital Signs ED Triage Vitals  Encounter Vitals Group     BP 01/09/24 1050 (!) 170/80     Girls Systolic BP Percentile --      Girls Diastolic BP Percentile --      Boys Systolic BP Percentile --      Boys  Diastolic BP Percentile --      Pulse Rate 01/09/24 1050 83     Resp 01/09/24 1050 18     Temp 01/09/24 1050 98.3 F (36.8 C)     Temp Source 01/09/24 1050 Oral     SpO2 01/09/24 1050 98 %     Weight --      Height --      Head Circumference --      Peak Flow --      Pain Score 01/09/24 1052 8     Pain Loc --      Pain Education --      Exclude from Growth Chart --    No data found.  Updated Vital Signs BP (!) 170/80 (BP Location: Right Arm)   Pulse 83   Temp 98.3 F (36.8 C) (Oral)   Resp 18   SpO2 98%   Visual Acuity Right Eye Distance:   Left Eye Distance:    Bilateral Distance:    Right Eye Near:   Left Eye Near:    Bilateral Near:     Physical Exam Vitals and nursing note reviewed.  Constitutional:      General: She is not in acute distress.    Appearance: Normal appearance.  HENT:     Head: Normocephalic.  Eyes:     Extraocular Movements: Extraocular movements intact.     Pupils: Pupils are equal, round, and reactive to light.  Cardiovascular:     Rate and Rhythm: Normal rate and regular rhythm.     Pulses: Normal pulses.     Heart sounds: Normal heart sounds.  Pulmonary:     Effort: Pulmonary effort is normal.     Breath sounds: Normal breath sounds.  Abdominal:     General: Bowel sounds are normal.     Palpations: Abdomen is soft.     Tenderness: There is abdominal tenderness in the epigastric area.  Musculoskeletal:     Cervical back: Normal range of motion.  Skin:    General: Skin is warm and dry.  Neurological:     General: No focal deficit present.     Mental Status: She is alert and oriented to person, place, and time.  Psychiatric:        Mood and Affect: Mood normal.        Behavior: Behavior normal.      UC Treatments / Results  Labs (all labs ordered are listed, but only abnormal results are displayed) Labs Reviewed - No data to display  EKG   Radiology No results found.  Procedures Procedures (including critical care time)  Medications Ordered in UC Medications - No data to display  Initial Impression / Assessment and Plan / UC Course  I have reviewed the triage vital signs and the nursing notes.  Pertinent labs & imaging results that were available during my care of the patient were reviewed by me and considered in my medical decision making (see chart for details).  Patient reports some relief of symptoms after the GI cocktail.  Per review of chart, patient's PCP has sent prescription for Dexilant  to her mail order pharmacy, patient has not received medication at this time.  In the interim,  will send 30-day supply until patient receives her regular prescription.  Supportive care recommendations were provided and discussed with patient and her daughter to include fluids, rest, over-the-counter analgesics, dietary modifications to avoid reflux triggers, and to monitor for worsening symptoms.  Daughter was advised regarding ER follow-up  precautions.  Patient is scheduled to see GI this week.  Patient and daughter were in agreement with this plan of care and verbalized understanding.  All questions were answered.  Patient stable for discharge.  Final Clinical Impressions(s) / UC Diagnoses   Final diagnoses:  None   Discharge Instructions   None    ED Prescriptions   None    PDMP not reviewed this encounter.   Gilmer Etta PARAS, NP 01/09/24 1154

## 2024-01-09 NOTE — ED Triage Notes (Signed)
 Upper ABD pain x 2 days.  States feels acid coming up.  Has appointment with GI on Thursday.

## 2024-01-14 ENCOUNTER — Ambulatory Visit (INDEPENDENT_AMBULATORY_CARE_PROVIDER_SITE_OTHER): Admitting: Gastroenterology

## 2024-01-14 ENCOUNTER — Encounter (INDEPENDENT_AMBULATORY_CARE_PROVIDER_SITE_OTHER): Payer: Self-pay | Admitting: Gastroenterology

## 2024-01-14 VITALS — BP 145/78 | HR 71 | Temp 98.3°F | Ht 59.0 in | Wt 134.4 lb

## 2024-01-14 DIAGNOSIS — K5903 Drug induced constipation: Secondary | ICD-10-CM

## 2024-01-14 DIAGNOSIS — K3184 Gastroparesis: Secondary | ICD-10-CM

## 2024-01-14 DIAGNOSIS — E1143 Type 2 diabetes mellitus with diabetic autonomic (poly)neuropathy: Secondary | ICD-10-CM

## 2024-01-14 DIAGNOSIS — K220 Achalasia of cardia: Secondary | ICD-10-CM | POA: Diagnosis not present

## 2024-01-14 DIAGNOSIS — R1013 Epigastric pain: Secondary | ICD-10-CM | POA: Diagnosis not present

## 2024-01-14 DIAGNOSIS — R1310 Dysphagia, unspecified: Secondary | ICD-10-CM

## 2024-01-14 DIAGNOSIS — K219 Gastro-esophageal reflux disease without esophagitis: Secondary | ICD-10-CM

## 2024-01-14 DIAGNOSIS — K5900 Constipation, unspecified: Secondary | ICD-10-CM | POA: Diagnosis not present

## 2024-01-14 DIAGNOSIS — R1010 Upper abdominal pain, unspecified: Secondary | ICD-10-CM

## 2024-01-14 MED ORDER — METOCLOPRAMIDE HCL 10 MG PO TABS: 10.0000 mg | ORAL_TABLET | Freq: Three times a day (TID) | ORAL | 1 refills | Status: AC

## 2024-01-14 MED ORDER — SUCRALFATE 1 GM/10ML PO SUSP: 1.0000 g | Freq: Four times a day (QID) | ORAL | 1 refills | Status: AC

## 2024-01-14 NOTE — Patient Instructions (Signed)
-  continue dexilant  60mg  daily -stop omeprazole  and start famotidine  40mg  in the evenings  -continue linzess  72mcg daily -continue miralax  as needed for constipation -carafate  1g before meals and at bedtime (dissolve tablet in 1/2 oz water , mix and drink) -will trial reglan  at low dose given history of gastroparesis (delayed emptying of stomach) I am providing a goodrx card and sending this to [facility] as insurance does not cover it -try to do smaller meals, smalls sips of liquids/popsicles, protein shakes as tolerated  Follow up 6 weeks  It was a pleasure to see you today. I want to create trusting relationships with patients and provide genuine, compassionate, and quality care. I truly value your feedback! please be on the lookout for a survey regarding your visit with me today. I appreciate your input about our visit and your time in completing this!    Lindsy Cerullo L. Retta Pitcher, MSN, APRN, AGNP-C Adult-Gerontology Nurse Practitioner [facility] Gastroenterology at [facility]

## 2024-01-14 NOTE — Progress Notes (Addendum)
 Referring Provider: Donal Channing SQUIBB, FNP Primary Care Physician:  Donal Channing SQUIBB, FNP Primary GI Physician: Dr. Eartha   Chief Complaint  Patient presents with   Follow-up    Pt arrives for follow up. Pt still having issues with stomach and vomiting. Pt went to Urgent Care on 01/09/24. Pt started on a med for her kidneys and believes that is what caused stomach pain. Pt having epigastric pain also.   HPI:   [name] is a 83 y.o. female with past medical history of  anxiety, diabetes, diabetic gastroparesis, fibromyalgia, chronic idiopathic constipation, GERD, TIA, achalasia status post Heller myotomy, history of colorectal cancer status post resection in 2010   Patient presenting today for:  Follow up of dysphagia/achalasia, GERD, constipation and abdominal pain  Last seen march, at that time continues to have dysphagia, though slightly better. Has upcoming appt in April at [facility] regarding HM Vs. POEM. Having a lot of regurgitation. Doing proteins shakes and had some weight gain. Heartburn almost daily on dexilant  60mg /famotidine  40mg . Needing TUMS/mylanta often. Having RLQ pain, constipation. Taking linzess  72mcg daily, miralax  2 capfuls most days. Did recently start vicodin BID  Recommended stop dexilant , famotidine . Start voquezna , stop linzess , miralax , start movantik  12.5mg  daily, continue protein shakes, keep appt at [facility] in April.  Unfortunately voquezna  was not covered by insurance, started on omeprazole  40mg  BID   Patient seen by Madie (Dr. Francois) on 4/9 with plans for POEM which was performed in june however underwent EGD with FLIP (POEM not completed) on 11/12/23: - LA Grade A esophagitis with no bleeding. - 2 cm hiatal hernia. - A small amount of food (residue) in the stomach. - Normal examined duodenum.  - FLIP imaging performed. Consistent with increased distensibility of the esophago-gastric junction. - No specimens collected. Recommendation:  - Use Nexium  (esomeprazole) 40 mg PO BID.                        - Use Pepcid  (famotidine ) 40 mg PO at bedtime.  At follow up with [facility] on 6/11, continued to have dysphagia to solids, considered CCB/dc of opioids due to possible hypercontractability of esophagus  She contacted our office on 7/16 requesting to resume dexilant  as symptoms not well controlled on omeprazole   Present:  Stated they were told by Dr. Francois that the reports from [facility] were inaccurate and that her esophagus was too stretched out for them to do POEM. They were told that due to the laxity of her esophagus she would need to have surgical repair to fix this which was not recommended. States she was started on nexium during the day and prilosec at night which has not helped. Feeling worse now than before. Having more issues with epigastric pain and vomiting/regurgitation. States that sometimes she has issues with liquids, but always has issues with solids. Regurgitation can happen shortly after eating or a little while later. She is doing frozen icees and popsicles to help. She endorses nausea as well. Has some burning in her chest at times. Still with some dysphagia with both solids and liquids. She often feels she is choking. Taking phenergan  for nausea which seems to help. Was recently on a dose of carindia which she finished.   She did restart dexilant  on Saturday, after she was seen at urgent care. symptoms flared up about 1 week ago. She is also taking prilosec at night. She got a GI cocktail at [facility] which provided some temporary relief.  Reports mylanta seems to help a lot. States that after her last visit with Dr. Francois, her follow up was cancelled as he did not feel she needed to follow up with them. She did have a course of cipro  in July, unsure if UGI symptoms worsened after this or not. Denies rectal bleeding  She is taking linzess  72mcg daily, miralax  on occasion. Does not think she every got movantik . Uses stool softner  occasionally. Bowels are moving okay, no issues with constipation at this time.    GES 2015: gastroparesis  had EGD with pyloric botox in 2015 for her gastroparesis by Dr. Gregory with improvement in her nausea at that time.This was done again in 2016 without significant improvement and again in 2017 with no improvement   CT A/P with contrast 04/27/23: Right middle lobe 5 mm ground-glass pulmonary nodule. No follow-up recommended. This recommendation follows the consensus statement: Guidelines for Management of Incidental Pulmonary Nodules Detected on CT Images: From the Fleischner Society 2017; Radiology 2017; 284:228-243. 2. Irregular urinary bladder wall thickening. Correlate urinalysis for infection. Recommend urologic consultation given irregularity and underlying malignancy not excluded. 3. Mild proximal right hydroureter and slightly more prominent right extrarenal pelvis. No hydronephrosis. No right nephroureterolithiasis. This may reflect the residual of a recently passed calculus or reflect chronic changes of obstructive uropathy or reflux. 4. Nonobstructive left nephrolithiasis-measuring up 2 mm. 5. At least small volume mixed sliding and paraesophageal hiatal hernia. 6. Rectal wall thickening likely due to under distension. No definite findings of colitis. If clinically indicated, consider colonoscopy for further evaluation. 7. Severe degenerative changes of grade 2 anterolisthesis of L5 on S1.   esophageal manometry and pH impedance testing: 04/2023  changes consistent with type III achalasia with persistently elevated IRP.  She also had an elevated DeMeester score while off Dexilant  consistent with ongoing GERD. She will benefit from continuing the Dexilant .  She has previously been treated for achalasia with Heller myotomy but symptoms have recurred.  At this point, I advised that she would benefit from being evaluated at [facility] for possible POEM (Dr. Delman) versus repeat  Heller myotomy, but if she is not a candidate or if she is not interested in this she may need to have an evaluation for PEG tube placement.  EGD: 01/2021 - Normal proximal esophagus. - Esophageal plaques were found, suspicious for candidiasis. Cells for cytology obtained -consistent with Candida. - Abnormal (rule out Barrett's esophagus) mucosa in the esophagus.  Biopsies were consistent with reflux - Z-line irregular, 33 cm from the incisors. - Small hyperplastic appearing polyp in gastric fundus. Was left alone. - Gastritis. Biopsied, negative for H. pylori or intestinal metaplasia. - A fundoplication was found. The wrap appears loose. - Normal duodenal bulb and second portion of the duodenum. Flex sig: 06/2023- Hemorrhoids found on perianal exam.                           - Rectocele found on perianal exam.                           - One 3 mm polyp in the sigmoid colon, removed with                            a cold snare. Resected and retrieved.                           -  Non-bleeding internal hemorrhoids. (Hyperplastic polyp)  Past Medical History:  Diagnosis Date   Anemia    Anxiety    Arthritis    all over (04/19/2013)   Asthma    Bleeding stomach ulcer 02/08/1979   Bloating    Chronic back pain    Chronic headaches    Chronic heartburn    Chronic neck pain    Chronic pain    Colon cancer (HCC)    DDD (degenerative disc disease), lumbar    Diabetic peripheral neuropathy (HCC)    in my feet (04/19/2013)   Dizziness    Dysphagia    Family history of anesthesia complication    daughter has PONV too (04/19/2013)   Fibromyalgia    Gastroesophageal reflux    Gastroparesis    Glaucoma, bilateral    Hiatal hernia    had it before; had OR; got it again (04/19/2013)   History of blood transfusion 02/08/1979   w/bleeding stomach ulcer (04/19/2013)   History of kidney stones    Migraine    take RX for it qd (04/19/2013)   Nausea    Pneumonia 04/09/2012    PONV (postoperative nausea and vomiting)    Renal insufficiency    TIA (transient ischemic attack)    3-4 before starting RX; none since (04/19/2013)   Type II diabetes mellitus (HCC)    Walking pneumonia ~ 1966    Past Surgical History:  Procedure Laterality Date   ABDOMINAL ADHESION SURGERY  ~ 2012   repaired wrap where they did hiatal hernia OR too 911/04/2013)   ANTERIOR CERVICAL DISCECTOMY  ~ 2009   only cleaned out arthritis and spurs (04/19/2013)   BIOPSY  01/09/2021   Procedure: BIOPSY;  Surgeon: Golda Claudis PENNER, MD;  [facility]: [facility];  Service: Endoscopy;;  antrum; distal esophagus;   CATARACT EXTRACTION W/ INTRAOCULAR LENS IMPLANT Left 04/13/2013   CHOLECYSTECTOMY  1980's   COLON SURGERY     COLONOSCOPY  10/11/2010   COLONOSCOPY  11/23/2009   COLONOSCOPY  07/28/2008   W/SNARE   COLONOSCOPY  06/28/07   COLONOSCOPY  05/10/07   W/POLYP   COLONOSCOPY  12/28/00   COLONOSCOPY N/A 06/29/2015   Procedure: COLONOSCOPY;  Surgeon: Claudis PENNER Golda, MD;  [facility]: [facility];  Service: Endoscopy;  Laterality: N/A;  135   COLONOSCOPY WITH ESOPHAGOGASTRODUODENOSCOPY (EGD) N/A 05/13/2013   Procedure: COLONOSCOPY WITH ESOPHAGOGASTRODUODENOSCOPY (EGD);  Surgeon: Claudis PENNER Golda, MD;  [facility]: [facility];  Service: Endoscopy;  Laterality: N/A;  855   COLONOSCOPY WITH PROPOFOL  N/A 01/09/2021   Procedure: COLONOSCOPY WITH PROPOFOL ;  Surgeon: Golda Claudis PENNER, MD;  [facility]: [facility];  Service: Endoscopy;  Laterality: N/A;  12:50   ESOPHAGOGASTRODUODENOSCOPY (EGD) WITH PROPOFOL  N/A 10/06/2018   Procedure: ESOPHAGOGASTRODUODENOSCOPY (EGD) WITH PROPOFOL ;  Surgeon: Golda Claudis PENNER, MD;  [facility];  Service: Endoscopy;  Laterality: N/A;   ESOPHAGOGASTRODUODENOSCOPY (EGD) WITH PROPOFOL  N/A 01/09/2021   Procedure: ESOPHAGOGASTRODUODENOSCOPY (EGD) WITH PROPOFOL ;  Surgeon: Golda Claudis PENNER, MD;  [facility];  Service: Endoscopy;  Laterality: N/A;   FLEXIBLE  SIGMOIDOSCOPY N/A 06/23/2023   Procedure: FLEXIBLE SIGMOIDOSCOPY;  Surgeon: Eartha Angelia Sieving, MD;  [facility];  Service: Gastroenterology;  Laterality: N/A;  9:30AM;ASA 3   HEMICOLECTOMY  2010   ZIEGLER   HIATAL HERNIA REPAIR  1970's   LEFT HEART CATHETERIZATION WITH CORONARY ANGIOGRAM N/A 04/19/2013   Procedure: LEFT HEART CATHETERIZATION WITH CORONARY ANGIOGRAM;  Surgeon: Peter M Swaziland, MD;  Location: [facility];  Service: Cardiovascular;  Laterality: N/A;   POLYPECTOMY  10/06/2018   Procedure: POLYPECTOMY;  Surgeon: Golda Claudis PENNER, MD;  [facility];  Service: Endoscopy;;  gastric   POLYPECTOMY  01/09/2021   Procedure: POLYPECTOMY;  Surgeon: Golda Claudis PENNER, MD;  [facility];  Service: Endoscopy;;  transverse;splenic flexure   POLYPECTOMY  06/23/2023   Procedure: POLYPECTOMY INTESTINAL;  Surgeon: Eartha Angelia Sieving, MD;  [facility];  Service: Gastroenterology;;   POSTERIOR FUSION CERVICAL SPINE  1985   had a broken neck (04/19/2013)   TONSILLECTOMY  ~ 1953   TOOTH EXTRACTION Bilateral 12/04/2020   Procedure: BILATERAL TEMPOROMANDIBULAR JOINT ARTHROCENTESIS; DENTAL EXTRACTION TEETH #3,6,7,8,9,10,11,12,19,23,24,25,26,32 WITH ALVEOLOPLASTY;  Surgeon: Sheryle Hamilton, DMD;  Location: [facility];  Service: Oral Surgery;  Laterality: Bilateral;   UPPER GASTROINTESTINAL ENDOSCOPY  10/11/2010   EGD ED   UPPER GASTROINTESTINAL ENDOSCOPY  11/23/2009   UPPER GASTROINTESTINAL ENDOSCOPY  05/10/07   EGD ED   UPPER GASTROINTESTINAL ENDOSCOPY  08/11/01   EGD ED   VAGINAL HYSTERECTOMY      Current Outpatient Medications  Medication Sig Dispense Refill   acetaminophen  (TYLENOL ) 500 MG tablet Take 500 mg by mouth every 6 (six) hours as needed for mild pain.      ALPRAZolam  (XANAX  XR) 1 MG 24 hr tablet Take 0.5-1 mg by mouth as needed for sleep (Anxiety).      ascorbic acid (VITAMIN C) 500 MG tablet Take 1,000 mg by mouth daily.      cholecalciferol (VITAMIN D3) 10 MCG (400 UNIT) TABS tablet Take 5,000 Units by mouth daily.     cyanocobalamin  (VITAMIN B12) 1000 MCG tablet Take 1,000 mcg by mouth daily.     Dapagliflozin Propanediol (FARXIGA PO) Take 10 mg by mouth daily at 6 (six) AM.     dexlansoprazole  (DEXILANT ) 60 MG capsule Take 1 capsule (60 mg total) by mouth daily. 90 capsule 3   dexlansoprazole  (DEXILANT ) 60 MG capsule Take 1 capsule (60 mg total) by mouth daily. 30 capsule 0   dicyclomine  (BENTYL ) 10 MG capsule Take 1 capsule (10 mg total) by mouth every 12 (twelve) hours as needed for spasms (abdominal pain/discomfort). 60 capsule 2   Docusate Sodium  (DSS) 100 MG CAPS Take 100 mg by mouth daily as needed.     Erenumab-aooe (AIMOVIG [place]) Inject into the skin every 30 (thirty) days.     esomeprazole (NEXIUM) 20 MG packet Take 40 mg by mouth daily before breakfast.     Evolocumab (REPATHA [place]) Inject into the skin. Every two weeks.     fluticasone  (FLONASE ) 50 MCG/ACT nasal spray Place 2 sprays into both nostrils daily.     gabapentin (NEURONTIN) 100 MG capsule Take 2 capsules by mouth 2 (two) times daily.     HYDROcodone -acetaminophen  (NORCO/VICODIN) 5-325 MG tablet Take 1 tablet by mouth 2 (two) times daily. 45 tablet 0   hydrocortisone  (ANUSOL -HC) 2.5 % rectal cream APPLY 1 APPLICATION RECTALLY TWICE DAILY. 30 g 1   Insulin  Glargine-Lixisenatide (SOLIQUA [place]) Inject 15 Units into the skin in the morning.     LINZESS  72 MCG capsule TAKE ONE CAPSULE BY MOUTH ONCE DAILY. 30 capsule 11   lisinopril  (ZESTRIL ) 5 MG tablet Take 5 mg by mouth daily.     loperamide  (IMODIUM ) 2 MG capsule Take 1 capsule (2 mg total) by mouth 2 (two) times daily as needed for diarrhea or loose stools.     Milnacipran HCl (SAVELLA) 100 MG  TABS tablet Take 100 mg by mouth 2 (two) times daily.     Multiple Vitamin (MULTIVITAMIN WITH MINERALS) TABS tablet Take 1 tablet by mouth daily.     naloxegol  oxalate (MOVANTIK ) 12.5 MG TABS tablet Take 1  tablet (12.5 mg total) by mouth daily. 60 tablet 1   omeprazole  (PRILOSEC) 40 MG capsule Take 40 mg by mouth daily.     polyethylene glycol powder (GLYCOLAX /MIRALAX ) 17 GM/SCOOP powder Take 0.5 Containers by mouth daily as needed for mild constipation.     polyvinyl alcohol (LIQUIFILM TEARS) 1.4 % ophthalmic solution Place 1 drop into both eyes 3 (three) times daily as needed for dry eyes.     promethazine  (PHENERGAN ) 12.5 MG tablet Take 12.5 mg by mouth every 8 (eight) hours as needed.     rizatriptan (MAXALT-MLT) 10 MG disintegrating tablet Take 10 mg by mouth as needed for migraine. May repeat in 2 hours if needed     silodosin (RAPAFLO) 8 MG CAPS capsule Take 8 mg by mouth at bedtime.      tiZANidine (ZANAFLEX) 2 MG tablet Take 2 mg by mouth 3 (three) times daily as needed.     topiramate  (TOPAMAX ) 100 MG tablet Take 1 tablet (100 mg total) by mouth 2 (two) times daily. 60 tablet 5   Travoprost, BAK Free, (TRAVATAN) 0.004 % SOLN ophthalmic solution Place 1 drop into both eyes at bedtime.      No current facility-administered medications for this visit.    Allergies as of 01/14/2024 - Review Complete 01/14/2024  Allergen Reaction Noted   Botox [onabotulinumtoxina]  05/05/2017   Morphine Anaphylaxis and Other (See Comments)    Aspirin  Other (See Comments) 05/02/2012   Cefuroxime  axetil Nausea And Vomiting 03/25/2013   Codeine Nausea And Vomiting    Diltiazem  Nausea And Vomiting 03/25/2013   Dronabinol Nausea And Vomiting and Palpitations 10/24/2013   Shellfish allergy Other (See Comments) 03/11/2011   Other  10/15/2021   Ondansetron  Hives 10/28/2011   Penicillins Rash     Social History   Socioeconomic History   Marital status: Widowed    Spouse name: Not on file   Number of children: Not on file   Years of education: Not on file   Highest education level: Not on file  Occupational History    Employer: RETIRED  Tobacco Use   Smoking status: Former    Current packs/day: 0.00     Average packs/day: 1.5 packs/day for 15.0 years (22.5 ttl pk-yrs)    Types: Cigarettes    Start date: 03/16/1976    Quit date: 03/17/1991    Years since quitting: 32.8    Passive exposure: Past   Smokeless tobacco: Never   Tobacco comments:    Patient states that it has ben greater than 20 years since she quit smoking  Vaping Use   Vaping status: Never Used  Substance and Sexual Activity   Alcohol use: No    Alcohol/week: 0.0 standard drinks of alcohol   Drug use: No   Sexual activity: Not Currently  Other Topics Concern   Not on file  Social History Narrative   Patient lives at home alone.    Patient is retired.    Patient is widowed.    Patient has 4 children.   Patient has a 11th grade education.          Social Drivers of Corporate investment banker Strain: Not on file  Food Insecurity: Not on file  Transportation Needs: Not on  file  Physical Activity: Not on file  Stress: Not on file  Social Connections: Not on file    Review of systems General: negative for malaise, night sweats, fever, chills, weight loss Neck: Negative for lumps, goiter, pain and significant neck swelling Resp: Negative for cough, wheezing, dyspnea at rest CV: Negative for chest pain, leg swelling, palpitations, orthopnea GI: denies melena, hematochezia, diarrhea, constipation, odyonophagia, early satiety or unintentional weight loss. +nausea +vomiting +epigastric pain +regurgitation +dysphagia  MSK: Negative for joint pain or swelling, back pain, and muscle pain. Derm: Negative for itching or rash Psych: Denies depression, anxiety, memory loss, confusion. No homicidal or suicidal ideation.  Heme: Negative for prolonged bleeding, bruising easily, and swollen nodes. Endocrine: Negative for cold or heat intolerance, polyuria, polydipsia and goiter. Neuro: negative for tremor, gait imbalance, syncope and seizures. The remainder of the review of systems is noncontributory.  Physical  Exam: There were no vitals taken for this visit. General:   Alert and oriented. No distress noted. Pleasant and cooperative.  Head:  Normocephalic and atraumatic. Eyes:  Conjuctiva clear without scleral icterus. Mouth:  Oral mucosa pink and moist. Good dentition. No lesions. Heart: Normal rate and rhythm, s1 and s2 heart sounds present.  Lungs: Clear lung sounds in all lobes. Respirations equal and unlabored. Abdomen:  +BS, soft, and non-distended. TTP of EPigastric region. No rebound or guarding. No HSM or masses noted. Derm: No palmar erythema or jaundice Msk:  Symmetrical without gross deformities. Normal posture. Extremities:  Without edema. Neurologic:  Alert and  oriented x4 Psych:  Alert and cooperative. Normal mood and affect.  Invalid input(s): 6 MONTHS   ASSESSMENT: FAY BAGG is a 83 y.o. female presenting today for follow up of dysphagia/achalasia, GERD, Constipation, nausea/vomiting in setting of gastroparesis, epigastric pain   Nausea/vomiting in setting of gastroparesis: GES in 2015 with confirmed gastroparesis, appears she was on reglan  in the past, which daughter states she had improvement with. She had pyloric botox in 2015 which helped, though repeat botox injections in 2016 and 2017 provided no relief. She reports epigastric pain, nausea and more vomiting recently. Query if this is a flare of her gastroparesis as she is currently only on phenergan  as needed. Recommend trial of reglan  to see if this improves her symptoms.   GERD/Epigastric pain: previously off of dexilant  which was recently restarted. She notes more burning in her chest, regurgitation at times. Of note, she is taking dexilant  60mg  in the AM/omeprazole  40mg  in the PM. For now, will continue dexilant  60mg  daily, stop omeprazole  and take famotidine  40mg  at bedtime. Attempted to send voquezna  in the past but this was not approved by her insurance, if she does not have improvement, may need to try and see  if she can get patient assistance for this. Will provide a course of carafate  as well to see if this helps, GERD likely worsened with vomiting, secondary to gastroparesis and from food stasis in setting of achalasia.   Constipation: seems well managed currently with linzess  72mcg daily. Miralax  as needed. Does not not think she ever got the movantik  sent in last time. As she is doing well, will continue with current bowel regimen.   Dysphagia/achalasia: saw [facility] recently for potential POEM as above, though upon endoscopic evaluation was noted to not be a candidate for POEM.  Last Note from [facility] (post procedure follow up) mentions possible benefits of Calcium channel blocker though though this was not prescribed and daughter states they were told they did  not need to follow up with them anymore. She continues to have issues with dysphagia. Will discuss further recommendations with Dr. Eartha   After our encounter, Glenys LPN went to let the patient go, however, she walked to the restroom and states she felt more nauseated and a little lightheaded. She was assisted back to the exam room and given an emesis bag, vitals rechecked, BP remained elevated at 169/89. I reassessed the patient who was alert, oriented, with no focal neuro deficits noted. Daughter at bedside reports patient has been dealing with a migraine and has these symptoms typically with her migraines, she is going to assist her home and administer migraine medication. Patient and daughter instructed on ED precautions if symptoms do not improve or she develops vomiting that does not subsided, syncope, disorientation, blurred vision, weakness/asymmetry of the body   PLAN:  -continue dexilant  60mg  daily -stop omeprazole  -start famotidine  40mg  QHS -continue linzess  72mcg daily -continue miralax  PRN -carafate  1g QID -will trial reglan  10mg  TID with meals  -small meals, small sips of liquids -can do icys/popsicles as tolerated to stay  hydrated -protein shakes as tolerated  -will discuss further recommendations with Dr. Eartha   All questions were answered, patient verbalized understanding and is in agreement with plan as outlined above.   Follow Up: 6 weeks   Mikyla Schachter L. Mariette, MSN, APRN, AGNP-C Adult-Gerontology Nurse Practitioner [facility]  I have reviewed the note and agree with the APP's assessment as described in this progress note  Very difficult situation.  The patient had initial manometry that was suggestive of recurrent achalasia but most recent EGD with End[facility]lip performed at [facility] showed increased distensibility which ruled out achalasia as the factors driving her symptoms.  Notably, she has been taking high-dose of hydrocodone  on a regular basis which could be driving her symptoms.  Ideally, these medications will be tapered off and will lead to improvement of her symptoms as well.    Unclear if her symptoms are driven by undertreated gastroparesis.  Agree with sending Reglan  course, but ultimately can discuss trying domperidone 5 mg 3 times daily.  Can try a course of diltiazem  p.o. if tolerated to improve her dysphagia if worsening, although this may also cause worsening constipation.  Will attempt to reach Dr. Joe office to discuss if there are any other recs from their standpoint.  Toribio Eartha, MD Gastroenterology and Hepatology [facility]

## 2024-02-09 ENCOUNTER — Encounter: Payer: Self-pay | Admitting: Anesthesiology

## 2024-02-09 ENCOUNTER — Ambulatory Visit: Attending: Anesthesiology | Admitting: Anesthesiology

## 2024-02-09 VITALS — BP 151/91 | HR 61 | Temp 97.2°F | Resp 16 | Ht 59.0 in | Wt 134.0 lb

## 2024-02-09 DIAGNOSIS — G8929 Other chronic pain: Secondary | ICD-10-CM | POA: Insufficient documentation

## 2024-02-09 DIAGNOSIS — M545 Low back pain, unspecified: Secondary | ICD-10-CM | POA: Diagnosis present

## 2024-02-09 DIAGNOSIS — M961 Postlaminectomy syndrome, not elsewhere classified: Secondary | ICD-10-CM | POA: Diagnosis present

## 2024-02-09 DIAGNOSIS — F1190 Opioid use, unspecified, uncomplicated: Secondary | ICD-10-CM | POA: Insufficient documentation

## 2024-02-09 DIAGNOSIS — M48062 Spinal stenosis, lumbar region with neurogenic claudication: Secondary | ICD-10-CM | POA: Diagnosis present

## 2024-02-09 DIAGNOSIS — M7918 Myalgia, other site: Secondary | ICD-10-CM | POA: Diagnosis present

## 2024-02-09 DIAGNOSIS — G894 Chronic pain syndrome: Secondary | ICD-10-CM | POA: Diagnosis present

## 2024-02-09 DIAGNOSIS — M542 Cervicalgia: Secondary | ICD-10-CM | POA: Diagnosis present

## 2024-02-09 DIAGNOSIS — M797 Fibromyalgia: Secondary | ICD-10-CM | POA: Insufficient documentation

## 2024-02-09 DIAGNOSIS — M5481 Occipital neuralgia: Secondary | ICD-10-CM | POA: Diagnosis not present

## 2024-02-09 MED ORDER — DEXAMETHASONE SODIUM PHOSPHATE 10 MG/ML IJ SOLN
10.0000 mg | Freq: Once | INTRAMUSCULAR | Status: AC
  Administered 2024-02-09: 10 mg

## 2024-02-09 MED ORDER — ROPIVACAINE HCL 2 MG/ML IJ SOLN
INTRAMUSCULAR | Status: AC
  Filled 2024-02-09: qty 20

## 2024-02-09 MED ORDER — DEXAMETHASONE SODIUM PHOSPHATE 10 MG/ML IJ SOLN
INTRAMUSCULAR | Status: AC
  Filled 2024-02-09: qty 1

## 2024-02-09 MED ORDER — ROPIVACAINE HCL 2 MG/ML IJ SOLN
10.0000 mL | Freq: Once | INTRAMUSCULAR | Status: AC
  Administered 2024-02-09: 10 mL via EPIDURAL

## 2024-02-09 MED ORDER — TRAMADOL HCL 50 MG PO TABS: 100.0000 mg | ORAL_TABLET | Freq: Three times a day (TID) | ORAL | 2 refills | Status: AC | PRN

## 2024-02-09 NOTE — Progress Notes (Unsigned)
 Nursing Pain Medication Assessment:  Safety precautions to be maintained throughout the outpatient stay will include: orient to surroundings, keep bed in low position, maintain call bell within reach at all times, provide assistance with transfer out of bed and ambulation.  Medication Inspection Compliance: Pill count conducted under aseptic conditions, in front of the patient. Neither the pills nor the bottle was removed from the patient's sight at any time. Once count was completed pills were immediately returned to the patient in their original bottle.  Medication #1: Tramadol  (Ultram ) Pill/Patch Count: 158 of 180 pills/patches remain Pill/Patch Appearance: Markings consistent with prescribed medication Bottle Appearance: Standard pharmacy container. Clearly labeled. Filled Date: 07 / 30 / 2025 Last Medication intake:  Yesterday  Medication #2: Hydrocodone /APAP Pill/Patch Count: 5 of 45 pills/patches remain Pill/Patch Appearance: Markings consistent with prescribed medication Bottle Appearance: Standard pharmacy container. Clearly labeled. Filled Date: 05 / 19 / 2025 Last Medication intake:  Yesterday

## 2024-02-09 NOTE — Patient Instructions (Signed)

## 2024-02-10 ENCOUNTER — Telehealth: Payer: Self-pay | Admitting: *Deleted

## 2024-02-10 NOTE — Telephone Encounter (Signed)
Post procedure call; mailbox full.

## 2024-02-10 NOTE — Progress Notes (Signed)
 Subjective:  Patient ID: [name], female    DOB: [birth year]  Age: 83 y.o. MRN: [id number]  CC: Neck Pain (Bilateral /) and Headache (Posterior )   Procedure: Bilateral greater occipital nerve block  HPI [name] presents for reevaluation.  Anysa sees me periodically for persistent tension headaches in the posterior neck.  Back in February and May she had a series of greater simple nerve blocks.  She generally gets significant improvement in the frequency and severity of her posterior tension headaches following these injections.  She has failed conservative therapy and requires these injection periodically to alleviate the severity of pain that she experiences.  This mainly a tension-like bandlike headache coming from the posterior neck.  She suffered a previous cervical fracture and has had tension component headaches for extended period of time.  She generally gets about 50 to 75% relief lasting about 2 to 3 months following the injection.  She does mention that she did have some mild exacerbation for short period of time on the left side following her last injection.  Otherwise no change in upper extremity strength function or the quality characteristic or distribution of her neck pain or tension headaches is noted at this time.  Outpatient Medications Prior to Visit  Medication Sig Dispense Refill   acetaminophen  (TYLENOL ) 500 MG tablet Take 500 mg by mouth every 6 (six) hours as needed for mild pain.      ALPRAZolam  (XANAX  XR) 1 MG 24 hr tablet Take 0.5-1 mg by mouth as needed for sleep (Anxiety).      ascorbic acid (VITAMIN C) 500 MG tablet Take 1,000 mg by mouth daily.     cholecalciferol (VITAMIN D3) 10 MCG (400 UNIT) TABS tablet Take 5,000 Units by mouth daily.     cyanocobalamin  (VITAMIN B12) 1000 MCG tablet Take 1,000 mcg by mouth daily.     Dapagliflozin Propanediol (FARXIGA PO) Take 10 mg by mouth daily at 6 (six) AM.     dexlansoprazole  (DEXILANT ) 60 MG capsule Take  1 capsule (60 mg total) by mouth daily. 90 capsule 3   dexlansoprazole  (DEXILANT ) 60 MG capsule Take 1 capsule (60 mg total) by mouth daily. 30 capsule 0   dicyclomine  (BENTYL ) 10 MG capsule Take 1 capsule (10 mg total) by mouth every 12 (twelve) hours as needed for spasms (abdominal pain/discomfort). 60 capsule 2   Docusate Sodium  (DSS) 100 MG CAPS Take 100 mg by mouth daily as needed.     Erenumab-aooe (AIMOVIG [place]) Inject into the skin every 30 (thirty) days.     Evolocumab (REPATHA [place]) Inject into the skin. Every two weeks.     fluticasone  (FLONASE ) 50 MCG/ACT nasal spray Place 2 sprays into both nostrils daily.     gabapentin (NEURONTIN) 100 MG capsule Take 2 capsules by mouth 2 (two) times daily.     HYDROcodone -acetaminophen  (NORCO/VICODIN) 5-325 MG tablet Take 1 tablet by mouth 2 (two) times daily. 45 tablet 0   hydrocortisone  (ANUSOL -HC) 2.5 % rectal cream APPLY 1 APPLICATION RECTALLY TWICE DAILY. 30 g 1   Insulin  Glargine-Lixisenatide (SOLIQUA [place]) Inject 15 Units into the skin in the morning.     LINZESS  72 MCG capsule TAKE ONE CAPSULE BY MOUTH ONCE DAILY. 30 capsule 11   lisinopril  (ZESTRIL ) 5 MG tablet Take 5 mg by mouth daily.     loperamide  (IMODIUM ) 2 MG capsule Take 1 capsule (2 mg total) by mouth 2 (two) times daily as needed for diarrhea or loose stools.  metoCLOPramide  (REGLAN ) 10 MG tablet Take 1 tablet (10 mg total) by mouth 3 (three) times daily before meals. 90 tablet 1   Milnacipran HCl (SAVELLA) 100 MG TABS tablet Take 100 mg by mouth 2 (two) times daily.     Multiple Vitamin (MULTIVITAMIN WITH MINERALS) TABS tablet Take 1 tablet by mouth daily.     omeprazole  (PRILOSEC) 40 MG capsule Take 40 mg by mouth daily.     polyethylene glycol powder (GLYCOLAX /MIRALAX ) 17 GM/SCOOP powder Take 0.5 Containers by mouth daily as needed for mild constipation.     polyvinyl alcohol (LIQUIFILM TEARS) 1.4 % ophthalmic solution Place 1 drop into both eyes 3 (three) times daily as needed  for dry eyes.     promethazine  (PHENERGAN ) 12.5 MG tablet Take 12.5 mg by mouth every 8 (eight) hours as needed.     rizatriptan (MAXALT-MLT) 10 MG disintegrating tablet Take 10 mg by mouth as needed for migraine. May repeat in 2 hours if needed     silodosin (RAPAFLO) 8 MG CAPS capsule Take 8 mg by mouth at bedtime.      sucralfate  (CARAFATE ) 1 GM/10ML suspension Take 10 mLs (1 g total) by mouth 4 (four) times daily. 420 mL 1   tiZANidine (ZANAFLEX) 2 MG tablet Take 2 mg by mouth 3 (three) times daily as needed.     topiramate  (TOPAMAX ) 100 MG tablet Take 1 tablet (100 mg total) by mouth 2 (two) times daily. 60 tablet 5   Travoprost, BAK Free, (TRAVATAN) 0.004 % SOLN ophthalmic solution Place 1 drop into both eyes at bedtime.      traMADol  (ULTRAM ) 50 MG tablet Take 50 mg by mouth every 8 (eight) hours as needed for moderate pain (pain score 4-6).     No facility-administered medications prior to visit.    Review of Systems CNS: No confusion or sedation Cardiac: No angina or palpitations GI: No abdominal pain or constipation Constitutional: No nausea vomiting fevers or chills  Objective:  BP (!) 151/91 (BP Location: Right Arm, Patient Position: Sitting, Cuff Size: Normal)   Pulse 61   Temp (!) 97.2 F (36.2 C) (Temporal)   Resp 16   Ht 4' 11 (1.499 m)   Wt 134 lb (60.8 kg)   SpO2 100%   BMI 27.06 kg/m    BP Readings from Last 3 Encounters:  02/09/24 (!) 151/91  01/14/24 (!) 145/78  01/09/24 (!) 170/80     Wt Readings from Last 3 Encounters:  02/09/24 134 lb (60.8 kg)  01/14/24 134 lb 6.4 oz (61 kg)  10/26/23 127 lb (57.6 kg)     Physical Exam Pt is alert and oriented PERRL EOMI HEART IS RRR no murmur or rub LCTA no wheezing or rales MUSCULOSKELETAL reveals tenderness over the greater occipital notch bilaterally in the posterior occipital region that does reproduce her primary pain complaint.  Labs  No results found for: HGBA1C Lab Results  Component Value  Date   CREATININE 0.89 04/21/2023    -------------------------------------------------------------------------------------------------------------------- Lab Results  Component Value Date   WBC 8.4 06/18/2023   HGB 12.6 06/18/2023   HCT 40.7 06/18/2023   PLT 223 06/18/2023   GLUCOSE 207 (H) 04/21/2023   ALT 42 (H) 04/21/2023   AST 13 04/21/2023   NA 140 04/21/2023   K 3.6 04/21/2023   CL 103 04/21/2023   CREATININE 0.89 04/21/2023   BUN 14 04/21/2023   CO2 28 04/21/2023   TSH 1.31 12/25/2015   INR 1.03 04/19/2013    ---------------------------------------------------------------------------------------------------------------------  No results found.   Assessment & Plan:   Lyllian was seen today for neck pain and headache.  Diagnoses and all orders for this visit:  Spinal stenosis of lumbar region with neurogenic claudication  Occipital neuralgia of left side  Chronic bilateral low back pain without sciatica  Cervicalgia  Fibromyalgia  Cervical myofascial pain syndrome  Cervical post-laminectomy syndrome  Chronic pain syndrome  Chronic, continuous use of opioids  Other orders -     dexamethasone  (DECADRON ) injection 10 mg -     ropivacaine  (PF) 2 mg/mL (0.2%) (NAROPIN ) injection 10 mL -     traMADol  (ULTRAM ) 50 MG tablet; Take 2 tablets (100 mg total) by mouth every 8 (eight) hours as needed for moderate pain (pain score 4-6).        ----------------------------------------------------------------------------------------------------------------------  Problem List Items Addressed This Visit       Unprioritized   Cervical post-laminectomy syndrome   Chronic low back pain   Relevant Medications   traMADol  (ULTRAM ) 50 MG tablet   Chronic pain syndrome   Relevant Medications   traMADol  (ULTRAM ) 50 MG tablet   Fibromyalgia   Relevant Medications   traMADol  (ULTRAM ) 50 MG tablet   Spinal stenosis of lumbar region - Primary   Other Visit Diagnoses        Occipital neuralgia of left side       Relevant Medications   ropivacaine  (PF) 2 mg/mL (0.2%) (NAROPIN ) injection 10 mL (Completed)   traMADol  (ULTRAM ) 50 MG tablet     Cervicalgia         Cervical myofascial pain syndrome       Relevant Medications   dexamethasone  (DECADRON ) injection 10 mg (Completed)   ropivacaine  (PF) 2 mg/mL (0.2%) (NAROPIN ) injection 10 mL (Completed)   traMADol  (ULTRAM ) 50 MG tablet     Chronic, continuous use of opioids             ----------------------------------------------------------------------------------------------------------------------  1. Spinal stenosis of lumbar region with neurogenic claudication (Primary) Continue her daily exercise stretching and strengthening as reviewed  2. Occipital neuralgia of left side Will proceed with bilateral greater occipital nerve block today.  We have gone over the risks and benefits of the procedure with her in full detail.  Continue with ice and heat application as necessary for acute pain relief.  Continue with current pain medication as well.  She has responded favorably to chronic medication for this.  3. Chronic bilateral low back pain without sciatica As above  4. Cervicalgia As above with refill of tramadol  2 tablets 3-4 times a day for pain relief.  5. Fibromyalgia As above  6. Cervical myofascial pain syndrome   7. Cervical post-laminectomy syndrome   8. Chronic pain syndrome I have reviewed the [place]  practitioner database information is appropriate for refill.  9. Chronic, continuous use of opioids As above and continue follow-up with her primary care physicians for baseline medical care.    ----------------------------------------------------------------------------------------------------------------------  I have changed Tammy LABOR. Schlegel's traMADol . I am also having her maintain her ALPRAZolam , acetaminophen , silodosin, topiramate , Travoprost (BAK Free),  multivitamin with minerals, artificial tears, lisinopril , hydrocortisone , loperamide , Dapagliflozin Propanediol (FARXIGA PO), dicyclomine , Erenumab-aooe (AIMOVIG [place]), Evolocumab (REPATHA [place]), Insulin  Glargine-Lixisenatide (SOLIQUA [place]), gabapentin, rizatriptan, cyanocobalamin , cholecalciferol, Milnacipran HCl, DSS, ascorbic acid, fluticasone , polyethylene glycol powder, promethazine , tiZANidine, HYDROcodone -acetaminophen , Linzess , dexlansoprazole , omeprazole , dexlansoprazole , metoCLOPramide , and sucralfate . We administered dexamethasone  and ropivacaine  (PF) 2 mg/mL (0.2%).   Meds ordered this encounter  Medications   dexamethasone  (DECADRON ) injection 10 mg  ropivacaine  (PF) 2 mg/mL (0.2%) (NAROPIN ) injection 10 mL   traMADol  (ULTRAM ) 50 MG tablet    Sig: Take 2 tablets (100 mg total) by mouth every 8 (eight) hours as needed for moderate pain (pain score 4-6).    Dispense:  180 tablet    Refill:  2   Patient's Medications  New Prescriptions   No medications on file  Previous Medications   ACETAMINOPHEN  (TYLENOL ) 500 MG TABLET    Take 500 mg by mouth every 6 (six) hours as needed for mild pain.    ALPRAZOLAM  (XANAX  XR) 1 MG 24 HR TABLET    Take 0.5-1 mg by mouth as needed for sleep (Anxiety).    ASCORBIC ACID (VITAMIN C) 500 MG TABLET    Take 1,000 mg by mouth daily.   CHOLECALCIFEROL (VITAMIN D3) 10 MCG (400 UNIT) TABS TABLET    Take 5,000 Units by mouth daily.   CYANOCOBALAMIN  (VITAMIN B12) 1000 MCG TABLET    Take 1,000 mcg by mouth daily.   DAPAGLIFLOZIN PROPANEDIOL (FARXIGA PO)    Take 10 mg by mouth daily at 6 (six) AM.   DEXLANSOPRAZOLE  (DEXILANT ) 60 MG CAPSULE    Take 1 capsule (60 mg total) by mouth daily.   DEXLANSOPRAZOLE  (DEXILANT ) 60 MG CAPSULE    Take 1 capsule (60 mg total) by mouth daily.   DICYCLOMINE  (BENTYL ) 10 MG CAPSULE    Take 1 capsule (10 mg total) by mouth every 12 (twelve) hours as needed for spasms (abdominal pain/discomfort).   DOCUSATE SODIUM  (DSS) 100 MG CAPS     Take 100 mg by mouth daily as needed.   ERENUMAB-AOOE (AIMOVIG [place])    Inject into the skin every 30 (thirty) days.   EVOLOCUMAB (REPATHA [place])    Inject into the skin. Every two weeks.   FLUTICASONE  (FLONASE ) 50 MCG/ACT NASAL SPRAY    Place 2 sprays into both nostrils daily.   GABAPENTIN (NEURONTIN) 100 MG CAPSULE    Take 2 capsules by mouth 2 (two) times daily.   HYDROCODONE -ACETAMINOPHEN  (NORCO/VICODIN) 5-325 MG TABLET    Take 1 tablet by mouth 2 (two) times daily.   HYDROCORTISONE  (ANUSOL -HC) 2.5 % RECTAL CREAM    APPLY 1 APPLICATION RECTALLY TWICE DAILY.   INSULIN  GLARGINE-LIXISENATIDE (SOLIQUA )    Inject 15 Units into the skin in the morning.   LINZESS  72 MCG CAPSULE    TAKE ONE CAPSULE BY MOUTH ONCE DAILY.   LISINOPRIL  (ZESTRIL ) 5 MG TABLET    Take 5 mg by mouth daily.   LOPERAMIDE  (IMODIUM ) 2 MG CAPSULE    Take 1 capsule (2 mg total) by mouth 2 (two) times daily as needed for diarrhea or loose stools.   METOCLOPRAMIDE  (REGLAN ) 10 MG TABLET    Take 1 tablet (10 mg total) by mouth 3 (three) times daily before meals.   MILNACIPRAN HCL (SAVELLA) 100 MG TABS TABLET    Take 100 mg by mouth 2 (two) times daily.   MULTIPLE VITAMIN (MULTIVITAMIN WITH MINERALS) TABS TABLET    Take 1 tablet by mouth daily.   OMEPRAZOLE  (PRILOSEC) 40 MG CAPSULE    Take 40 mg by mouth daily.   POLYETHYLENE GLYCOL POWDER (GLYCOLAX /MIRALAX ) 17 GM/SCOOP POWDER    Take 0.5 Containers by mouth daily as needed for mild constipation.   POLYVINYL ALCOHOL (LIQUIFILM TEARS) 1.4 % OPHTHALMIC SOLUTION    Place 1 drop into both eyes 3 (three) times daily as needed for dry eyes.   PROMETHAZINE  (PHENERGAN ) 12.5 MG TABLET  Take 12.5 mg by mouth every 8 (eight) hours as needed.   RIZATRIPTAN (MAXALT-MLT) 10 MG DISINTEGRATING TABLET    Take 10 mg by mouth as needed for migraine. May repeat in 2 hours if needed   SILODOSIN (RAPAFLO) 8 MG CAPS CAPSULE    Take 8 mg by mouth at bedtime.    SUCRALFATE  (CARAFATE ) 1 GM/10ML SUSPENSION     Take 10 mLs (1 g total) by mouth 4 (four) times daily.   TIZANIDINE (ZANAFLEX) 2 MG TABLET    Take 2 mg by mouth 3 (three) times daily as needed.   TOPIRAMATE  (TOPAMAX ) 100 MG TABLET    Take 1 tablet (100 mg total) by mouth 2 (two) times daily.   TRAVOPROST, BAK FREE, (TRAVATAN) 0.004 % SOLN OPHTHALMIC SOLUTION    Place 1 drop into both eyes at bedtime.   Modified Medications   Modified Medication Previous Medication   TRAMADOL  (ULTRAM ) 50 MG TABLET traMADol  (ULTRAM ) 50 MG tablet      Take 2 tablets (100 mg total) by mouth every 8 (eight) hours as needed for moderate pain (pain score 4-6).    Take 50 mg by mouth every 8 (eight) hours as needed for moderate pain (pain score 4-6).  Discontinued Medications   No medications on file   ----------------------------------------------------------------------------------------------------------------------  Follow-up: Return in about 1 month (around 03/10/2024) for evaluation, med refill.   Greater occipital nerve block on the bilateral side , blood pressure, pulse, pulse oximetry monitoring. Greater occipital nerve block on the left and right side. Following identification of the nuchal ridge, before-gauge needle was inserted at the level of the nuchal ridge medial to the occipital artery.  Following negative aspiration, 4cc 0.2% ropivacaine  with 4 mg of Decadron  injected for left greater occipital nerve block.  Needle was removed.   The same procedure was performed on the right side as above.  There was once again negative aspiration and a 24-gauge needle was used to infiltrate 4 cc of the same mixture on the right side.  Patient tolerated injection well.  Lynwood KANDICE Clause, MD

## 2024-02-25 ENCOUNTER — Emergency Department (HOSPITAL_COMMUNITY)
Admission: EM | Admit: 2024-02-25 | Discharge: 2024-02-25 | Disposition: A | Discharge status: Home / Self Care | Source: Ambulatory Visit | Attending: Emergency Medicine | Admitting: Emergency Medicine

## 2024-02-25 ENCOUNTER — Encounter (INDEPENDENT_AMBULATORY_CARE_PROVIDER_SITE_OTHER): Payer: Self-pay

## 2024-02-25 ENCOUNTER — Other Ambulatory Visit: Payer: Self-pay

## 2024-02-25 ENCOUNTER — Encounter (HOSPITAL_COMMUNITY): Payer: Self-pay

## 2024-02-25 ENCOUNTER — Ambulatory Visit (INDEPENDENT_AMBULATORY_CARE_PROVIDER_SITE_OTHER): Admitting: Gastroenterology

## 2024-02-25 DIAGNOSIS — E876 Hypokalemia: Secondary | ICD-10-CM

## 2024-02-25 DIAGNOSIS — Z7984 Long term (current) use of oral hypoglycemic drugs: Secondary | ICD-10-CM | POA: Diagnosis not present

## 2024-02-25 DIAGNOSIS — E1143 Type 2 diabetes mellitus with diabetic autonomic (poly)neuropathy: Secondary | ICD-10-CM | POA: Insufficient documentation

## 2024-02-25 DIAGNOSIS — Z794 Long term (current) use of insulin: Secondary | ICD-10-CM | POA: Diagnosis not present

## 2024-02-25 DIAGNOSIS — I2510 Atherosclerotic heart disease of native coronary artery without angina pectoris: Secondary | ICD-10-CM | POA: Insufficient documentation

## 2024-02-25 DIAGNOSIS — R112 Nausea with vomiting, unspecified: Secondary | ICD-10-CM | POA: Diagnosis present

## 2024-02-25 LAB — COMPREHENSIVE METABOLIC PANEL WITH GFR
ALT: 17 U/L (ref 0–44)
AST: 24 U/L (ref 15–41)
Albumin: 3.2 g/dL — ABNORMAL LOW (ref 3.5–5.0)
Alkaline Phosphatase: 76 U/L (ref 38–126)
Anion gap: 18 — ABNORMAL HIGH (ref 5–15)
BUN: 10 mg/dL (ref 8–23)
CO2: 19 mmol/L — ABNORMAL LOW (ref 22–32)
Calcium: 8.8 mg/dL — ABNORMAL LOW (ref 8.9–10.3)
Chloride: 103 mmol/L (ref 98–111)
Creatinine, Ser: 0.93 mg/dL (ref 0.44–1.00)
GFR, Estimated: >60 mL/min (ref >60)
Glucose, Bld: 219 mg/dL — ABNORMAL HIGH (ref 70–99)
Potassium: 3 mmol/L — ABNORMAL LOW (ref 3.5–5.1)
Sodium: 140 mmol/L (ref 135–145)
Total Bilirubin: 0.4 mg/dL (ref 0.0–1.2)
Total Protein: 7 g/dL (ref 6.5–8.1)

## 2024-02-25 LAB — CBC WITH DIFFERENTIAL/PLATELET
Abs Immature Granulocytes: 0.05 K/uL (ref 0.00–0.07)
Basophils Absolute: 0 K/uL (ref 0.0–0.1)
Basophils Relative: 0 %
Eosinophils Absolute: 0 K/uL (ref 0.0–0.5)
Eosinophils Relative: 0 %
HCT: 45.4 % (ref 36.0–46.0)
Hemoglobin: 13.9 g/dL (ref 12.0–15.0)
Immature Granulocytes: 1 %
Lymphocytes Relative: 22 %
Lymphs Abs: 2 K/uL (ref 0.7–4.0)
MCH: 30.6 pg (ref 26.0–34.0)
MCHC: 30.6 g/dL (ref 30.0–36.0)
MCV: 100 fL (ref 80.0–100.0)
Monocytes Absolute: 0.3 K/uL (ref 0.1–1.0)
Monocytes Relative: 3 %
Neutro Abs: 6.6 K/uL (ref 1.7–7.7)
Neutrophils Relative %: 74 %
Platelets: 274 K/uL (ref 150–400)
RBC: 4.54 MIL/uL (ref 3.87–5.11)
RDW: 14.1 % (ref 11.5–15.5)
WBC: 9 K/uL (ref 4.0–10.5)
nRBC: 0 % (ref 0.0–0.2)

## 2024-02-25 LAB — URINALYSIS, ROUTINE W REFLEX MICROSCOPIC
Bacteria, UA: NONE SEEN
Bilirubin Urine: NEGATIVE
Glucose, UA: NEGATIVE mg/dL
Hgb urine dipstick: NEGATIVE
Ketones, ur: NEGATIVE mg/dL
Nitrite: NEGATIVE
Protein, ur: NEGATIVE mg/dL
Specific Gravity, Urine: 1.003 — ABNORMAL LOW (ref 1.005–1.030)
pH: 7 (ref 5.0–8.0)

## 2024-02-25 LAB — MAGNESIUM: Magnesium: 1.8 mg/dL (ref 1.7–2.4)

## 2024-02-25 LAB — LIPASE, BLOOD: Lipase: 32 U/L (ref 11–51)

## 2024-02-25 MED ORDER — POTASSIUM CHLORIDE 10 MEQ/100ML IV SOLN
10.0000 meq | INTRAVENOUS | Status: AC
  Administered 2024-02-25 (×2): 10 meq via INTRAVENOUS
  Filled 2024-02-25 (×2): qty 100

## 2024-02-25 MED ORDER — LACTATED RINGERS IV BOLUS
1000.0000 mL | Freq: Once | INTRAVENOUS | Status: AC
  Administered 2024-02-25: 1000 mL via INTRAVENOUS

## 2024-02-25 MED ORDER — POTASSIUM CHLORIDE 20 MEQ PO PACK: 20.0000 meq | PACK | Freq: Two times a day (BID) | ORAL | 0 refills | Status: DC

## 2024-02-25 MED ORDER — METOCLOPRAMIDE HCL 5 MG/ML IJ SOLN
5.0000 mg | Freq: Once | INTRAMUSCULAR | Status: AC
  Administered 2024-02-25: 5 mg via INTRAVENOUS
  Filled 2024-02-25: qty 2

## 2024-02-25 MED ORDER — POTASSIUM CHLORIDE 20 MEQ PO PACK
20.0000 meq | PACK | Freq: Once | ORAL | Status: AC
  Administered 2024-02-25: 20 meq via ORAL
  Filled 2024-02-25: qty 1

## 2024-02-25 MED ORDER — POTASSIUM CHLORIDE 20 MEQ PO PACK: 20.0000 meq | PACK | Freq: Two times a day (BID) | ORAL | 0 refills | Status: AC

## 2024-02-25 MED ORDER — DIPHENHYDRAMINE HCL 50 MG/ML IJ SOLN
12.5000 mg | Freq: Once | INTRAMUSCULAR | Status: AC
  Administered 2024-02-25: 12.5 mg via INTRAVENOUS
  Filled 2024-02-25: qty 1

## 2024-02-25 NOTE — Discharge Instructions (Signed)
 You are seen today because you are having continued trouble keeping down your potassium pills that were prescribed.  I prescribed the packets which can be mixed with liquid and drying.  As long as you are tolerating these well you can call your PCP to see if they can represcribe this.  You also got IV potassium here in the ER.  Come back if you have new or worsening symptoms, otherwise follow-up with your primary care doctor.

## 2024-02-25 NOTE — ED Triage Notes (Signed)
 Pt arrived via POV after receiving a phone call from her GI Doctor who reported her K+ was very low and advised her to go to the ER. Pt reports Hx of gastroparesis and is unable keep anything down. Pt also reports her K+ pills make her sick.

## 2024-02-25 NOTE — ED Provider Notes (Signed)
  EMERGENCY DEPARTMENT AT [facility] Provider Note   CSN: [id number] Arrival date & time: 02/25/24  1321     Patient presents with: Emesis   [name] is a 83 y.o. female.  She has a history of GI bleeding, CAD, diabetes, gastroparesis.   She reports she is having nausea vomiting for the past week no diarrhea.  No abdominal pain.  She states this started after she got her flu shot and she states this is a normal reaction to her flu shot for her, she always gets sick after getting it.  She started getting better but then her PCP prescribed potassium tablets 40 mg and she is not able to keep them down, stating they make her feel more sick.  Her PCP called and said since her potassium levels were low she needed to come to the ER for further evaluation and possible IV potassium replacement.  Patient states the potassium pills caused her to gag and she thinks that is driving the vomiting when she takes the pills.  She denies any abdominal pain.  Denies fever or chills.  Denies urinary symptoms.    Emesis      Prior to Admission medications   Medication Sig Start Date End Date Taking? Authorizing Provider  acetaminophen  (TYLENOL ) 500 MG tablet Take 500 mg by mouth every 6 (six) hours as needed for mild pain.     [provider]  ALPRAZolam  (XANAX  XR) 1 MG 24 hr tablet Take 0.5-1 mg by mouth as needed for sleep (Anxiety).     [provider]  ascorbic acid (VITAMIN C) 500 MG tablet Take 1,000 mg by mouth daily.    [provider]  cholecalciferol (VITAMIN D3) 10 MCG (400 UNIT) TABS tablet Take 5,000 Units by mouth daily.    [provider]  cyanocobalamin  (VITAMIN B12) 1000 MCG tablet Take 1,000 mcg by mouth daily.    [provider]  Dapagliflozin Propanediol (FARXIGA PO) Take 10 mg by mouth daily at 6 (six) AM.    [provider]  dexlansoprazole  (DEXILANT ) 60 MG capsule Take 1 capsule (60 mg total) by mouth  daily. 12/23/23   Carlan, Chelsea L, NP  dexlansoprazole  (DEXILANT ) 60 MG capsule Take 1 capsule (60 mg total) by mouth daily. 01/09/24   Leath-Warren, Etta PARAS, NP  dicyclomine  (BENTYL ) 10 MG capsule Take 1 capsule (10 mg total) by mouth every 12 (twelve) hours as needed for spasms (abdominal pain/discomfort). 02/17/22   Castaneda Mayorga, Daniel, MD  Docusate Sodium  (DSS) 100 MG CAPS Take 100 mg by mouth daily as needed. 08/10/13   [provider]  Erenumab-aooe (AIMOVIG [place]) Inject into the skin every 30 (thirty) days.    [provider]  Evolocumab (REPATHA [place]) Inject into the skin. Every two weeks.    [provider]  fluticasone  (FLONASE ) 50 MCG/ACT nasal spray Place 2 sprays into both nostrils daily.    [provider]  gabapentin (NEURONTIN) 100 MG capsule Take 2 capsules by mouth 2 (two) times daily.    [provider]  HYDROcodone -acetaminophen  (NORCO/VICODIN) 5-325 MG tablet Take 1 tablet by mouth 2 (two) times daily. 07/15/23   Adams, James G, MD  hydrocortisone  (ANUSOL -HC) 2.5 % rectal cream APPLY 1 APPLICATION RECTALLY TWICE DAILY. 10/10/21   Shirlean Therisa ORN, NP  Insulin  Glargine-Lixisenatide [facility]) Inject 15 Units into the skin in the morning.    [provider]  LINZESS  72 MCG capsule TAKE ONE CAPSULE BY MOUTH  ONCE DAILY. 08/17/23   Eartha Angelia Sieving, MD  lisinopril  (ZESTRIL ) 5 MG tablet Take 5 mg by mouth daily.    [provider]  loperamide  (IMODIUM ) 2 MG capsule Take 1 capsule (2 mg total) by mouth 2 (two) times daily as needed for diarrhea or loose stools. 10/15/21   Rehman, Claudis PENNER, MD  metoCLOPramide  (REGLAN ) 10 MG tablet Take 1 tablet (10 mg total) by mouth 3 (three) times daily before meals. 01/14/24   Carlan, Chelsea L, NP  Milnacipran HCl (SAVELLA) 100 MG TABS tablet Take 100 mg by mouth 2 (two) times daily.    [provider]  Multiple Vitamin (MULTIVITAMIN WITH MINERALS) TABS tablet Take 1 tablet by  mouth daily.    [provider]  omeprazole  (PRILOSEC) 40 MG capsule Take 40 mg by mouth daily.    [provider]  polyethylene glycol powder (GLYCOLAX /MIRALAX ) 17 GM/SCOOP powder Take 0.5 Containers by mouth daily as needed for mild constipation. 06/17/11   [provider]  polyvinyl alcohol (LIQUIFILM TEARS) 1.4 % ophthalmic solution Place 1 drop into both eyes 3 (three) times daily as needed for dry eyes.    [provider]  promethazine  (PHENERGAN ) 12.5 MG tablet Take 12.5 mg by mouth every 8 (eight) hours as needed.    [provider]  rizatriptan (MAXALT-MLT) 10 MG disintegrating tablet Take 10 mg by mouth as needed for migraine. May repeat in 2 hours if needed    [provider]  silodosin (RAPAFLO) 8 MG CAPS capsule Take 8 mg by mouth at bedtime.     [provider]  sucralfate  (CARAFATE ) 1 GM/10ML suspension Take 10 mLs (1 g total) by mouth 4 (four) times daily. 01/14/24   Carlan, Chelsea L, NP  tiZANidine (ZANAFLEX) 2 MG tablet Take 2 mg by mouth 3 (three) times daily as needed.    [provider]  topiramate  (TOPAMAX ) 100 MG tablet Take 1 tablet (100 mg total) by mouth 2 (two) times daily. 10/08/16   Babs Arthea DASEN, MD  traMADol  (ULTRAM ) 50 MG tablet Take 2 tablets (100 mg total) by mouth every 8 (eight) hours as needed for moderate pain (pain score 4-6). 02/09/24 03/10/24  Myra Lynwood MATSU, MD  Travoprost, BAK Free, (TRAVATAN) 0.004 % SOLN ophthalmic solution Place 1 drop into both eyes at bedtime.  04/22/18   [provider]    Allergies: Botox [onabotulinumtoxina], Morphine, Aspirin , Cefuroxime  axetil, Codeine, Diltiazem , Dronabinol, Shellfish allergy, Other, Ondansetron , and Penicillins    Review of Systems  Gastrointestinal:  Positive for vomiting.    Updated Vital Signs BP (!) 142/68 (BP Location: Right Arm)   Pulse 67   Temp 98.6 F (37 C) (Oral)   Resp 18   Ht 4' 11 (1.499 m)   Wt 60.7 kg   SpO2  98%   BMI 27.03 kg/m   Physical Exam Vitals and nursing note reviewed.  Constitutional:      General: She is not in acute distress.    Appearance: She is well-developed.  HENT:     Head: Normocephalic and atraumatic.  Eyes:     Conjunctiva/sclera: Conjunctivae normal.  Cardiovascular:     Rate and Rhythm: Normal rate and regular rhythm.     Heart sounds: No murmur heard. Pulmonary:     Effort: Pulmonary effort is normal. No respiratory distress.     Breath sounds: Normal breath sounds.  Abdominal:     Palpations: Abdomen is soft.     Tenderness: There is  no abdominal tenderness.  Musculoskeletal:        General: No swelling.     Cervical back: Neck supple.  Skin:    General: Skin is warm and dry.     Capillary Refill: Capillary refill takes less than 2 seconds.  Neurological:     General: No focal deficit present.     Mental Status: She is alert and oriented to person, place, and time.  Psychiatric:        Mood and Affect: Mood normal.     (all labs ordered are listed, but only abnormal results are displayed) Labs Reviewed  CBC WITH DIFFERENTIAL/PLATELET  COMPREHENSIVE METABOLIC PANEL WITH GFR  LIPASE, BLOOD  URINALYSIS, ROUTINE W REFLEX MICROSCOPIC    EKG: None  Radiology: No results found.   Procedures   Medications Ordered in the ED - No data to display                                  Medical Decision Making This patient presents to the ED for concern of nausea and vomiting, low potassium, this involves an extensive number of treatment options, and is a complaint that carries with it a high risk of complications and morbidity.  The differential diagnosis includes electrolyte derangement, dehydration, DKA, gastritis, gastroparesis, UTI, other   Co morbidities that complicate the patient evaluation  Diabetes, gastroparesis   Additional history obtained:  Additional history obtained from EMR External records from outside source obtained and  reviewed including previous notes and labs   Lab Tests:  I Ordered, and personally interpreted labs.  The pertinent results include: Potassium 3.0, CO2 19, glucose 219, anion gap 18, urine without ketones, small leukocytes and 16 white blood cells without bacteria, normal magnesium , normal lipase, normal CBC      Problem List / ED Course / Critical interventions / Medication management  Patient having continued nausea and vomiting, sent up for low potassium.  Potassium years, EKG similar to baseline though having supraventricular bigeminy, she did have a palpitations or dizziness, no chest pain or shortness of breath.  Potassium 3.0, normal magnesium , given IV and oral potassium.  Patient states that she has mostly been having problems swallowing the pills that make her gag again then she vomits.  She was given oral potassium packet here and tolerated this well, she is feeling better and is agreeable with discharge home to follow-up with PCP and GI, given prescription for potassium packets breath and pills advised on close outpatient follow-up.  He did have mild hyperglycemia with increased anion anion gap and slightly decreased CO2 but no ketones in the urine this is likely secondary to dehydration, not insistent with DKA at this time she is well-appearing and able to tolerate p.o., patient requesting go home and feel this is reasonable at this time. I ordered medication including Reglan  for nausea Reevaluation of the patient after these medicines showed that the patient improved I have reviewed the patients home medicines and have made adjustments as needed      Amount and/or Complexity of Data Reviewed Labs: ordered.  Risk Prescription drug management.        Final diagnoses:  None    ED Discharge Orders     None          [name] 02/25/24 1919    Cleotilde Rogue, MD 02/26/24 [id number]

## 2024-03-01 ENCOUNTER — Ambulatory Visit: Attending: Anesthesiology | Admitting: Anesthesiology

## 2024-03-01 DIAGNOSIS — G894 Chronic pain syndrome: Secondary | ICD-10-CM

## 2024-03-01 DIAGNOSIS — Z79891 Long term (current) use of opiate analgesic: Secondary | ICD-10-CM

## 2024-03-01 DIAGNOSIS — M542 Cervicalgia: Secondary | ICD-10-CM | POA: Diagnosis not present

## 2024-03-01 DIAGNOSIS — M961 Postlaminectomy syndrome, not elsewhere classified: Secondary | ICD-10-CM

## 2024-03-01 DIAGNOSIS — M545 Low back pain, unspecified: Secondary | ICD-10-CM

## 2024-03-01 DIAGNOSIS — M48062 Spinal stenosis, lumbar region with neurogenic claudication: Secondary | ICD-10-CM | POA: Diagnosis not present

## 2024-03-01 DIAGNOSIS — F1190 Opioid use, unspecified, uncomplicated: Secondary | ICD-10-CM

## 2024-03-01 DIAGNOSIS — M5481 Occipital neuralgia: Secondary | ICD-10-CM | POA: Diagnosis not present

## 2024-03-01 DIAGNOSIS — M797 Fibromyalgia: Secondary | ICD-10-CM | POA: Diagnosis not present

## 2024-03-01 DIAGNOSIS — H9209 Otalgia, unspecified ear: Secondary | ICD-10-CM

## 2024-03-01 DIAGNOSIS — M7918 Myalgia, other site: Secondary | ICD-10-CM

## 2024-03-02 ENCOUNTER — Encounter: Payer: Self-pay | Admitting: Anesthesiology

## 2024-03-02 NOTE — Progress Notes (Signed)
 Virtual Visit via Telephone Note  I connected with [name] on 03/02/24 at  1:20 PM EDT by telephone and verified that I am speaking with the correct person using two identifiers.  Location: Patient: Home Provider: Pain control center   I discussed the limitations, risks, security and privacy concerns of performing an evaluation and management service by telephone and the availability of in person appointments. I also discussed with the patient that there may be a patient responsible charge related to this service. The patient expressed understanding and agreed to proceed.   History of Present Illness: I spoke to [name] via telephone her conversation with close with the patient and her daughter.  They report that she did respond favorably to the recent greater occipital nerve blocks.  She has had some ongoing ear pain.  Ear pain really is her primary pain generator at this time as the tension posterior headaches have abated following the injection.  These do recur however are much less intense than previous.  She continues to take her medications as prescribed and these have worked well for her.  Otherwise she is in her usual state of health.  She has been taking tramadol  for her chronic pain and this has worked well for her with no side effects reported.  Review of systems: General: No fevers or chills Pulmonary: No shortness of breath or dyspnea Cardiac: No angina or palpitations or lightheadedness GI: No abdominal pain or constipation Psych: No depression    Observations/Objective:  Current Outpatient Medications:    acetaminophen  (TYLENOL ) 500 MG tablet, Take 500 mg by mouth every 6 (six) hours as needed for mild pain. , Disp: , Rfl:    ALPRAZolam  (XANAX  XR) 1 MG 24 hr tablet, Take 0.5-1 mg by mouth as needed for sleep (Anxiety). , Disp: , Rfl:    ascorbic acid (VITAMIN C) 500 MG tablet, Take 1,000 mg by mouth daily., Disp: , Rfl:    cholecalciferol (VITAMIN D3) 10 MCG  (400 UNIT) TABS tablet, Take 5,000 Units by mouth daily., Disp: , Rfl:    cyanocobalamin  (VITAMIN B12) 1000 MCG tablet, Take 1,000 mcg by mouth daily., Disp: , Rfl:    Dapagliflozin Propanediol (FARXIGA PO), Take 10 mg by mouth daily at 6 (six) AM., Disp: , Rfl:    dexlansoprazole  (DEXILANT ) 60 MG capsule, Take 1 capsule (60 mg total) by mouth daily., Disp: 90 capsule, Rfl: 3   dexlansoprazole  (DEXILANT ) 60 MG capsule, Take 1 capsule (60 mg total) by mouth daily., Disp: 30 capsule, Rfl: 0   dicyclomine  (BENTYL ) 10 MG capsule, Take 1 capsule (10 mg total) by mouth every 12 (twelve) hours as needed for spasms (abdominal pain/discomfort)., Disp: 60 capsule, Rfl: 2   Docusate Sodium  (DSS) 100 MG CAPS, Take 100 mg by mouth daily as needed., Disp: , Rfl:    Erenumab-aooe (AIMOVIG [place]), Inject into the skin every 30 (thirty) days., Disp: , Rfl:    Evolocumab (REPATHA [place]), Inject into the skin. Every two weeks., Disp: , Rfl:    fluticasone  (FLONASE ) 50 MCG/ACT nasal spray, Place 2 sprays into both nostrils daily., Disp: , Rfl:    gabapentin (NEURONTIN) 100 MG capsule, Take 2 capsules by mouth 2 (two) times daily., Disp: , Rfl:    HYDROcodone -acetaminophen  (NORCO/VICODIN) 5-325 MG tablet, Take 1 tablet by mouth 2 (two) times daily., Disp: 45 tablet, Rfl: 0   hydrocortisone  (ANUSOL -HC) 2.5 % rectal cream, APPLY 1 APPLICATION RECTALLY TWICE DAILY., Disp: 30 g, Rfl: 1   Insulin   Glargine-Lixisenatide (SOLIQUA [place]), Inject 15 Units into the skin in the morning., Disp: , Rfl:    LINZESS  72 MCG capsule, TAKE ONE CAPSULE BY MOUTH ONCE DAILY., Disp: 30 capsule, Rfl: 11   lisinopril  (ZESTRIL ) 5 MG tablet, Take 5 mg by mouth daily., Disp: , Rfl:    loperamide  (IMODIUM ) 2 MG capsule, Take 1 capsule (2 mg total) by mouth 2 (two) times daily as needed for diarrhea or loose stools., Disp: , Rfl:    metoCLOPramide  (REGLAN ) 10 MG tablet, Take 1 tablet (10 mg total) by mouth 3 (three) times daily before meals., Disp: 90 tablet,  Rfl: 1   Milnacipran HCl (SAVELLA) 100 MG TABS tablet, Take 100 mg by mouth 2 (two) times daily., Disp: , Rfl:    Multiple Vitamin (MULTIVITAMIN WITH MINERALS) TABS tablet, Take 1 tablet by mouth daily., Disp: , Rfl:    omeprazole  (PRILOSEC) 40 MG capsule, Take 40 mg by mouth daily., Disp: , Rfl:    polyethylene glycol powder (GLYCOLAX /MIRALAX ) 17 GM/SCOOP powder, Take 0.5 Containers by mouth daily as needed for mild constipation., Disp: , Rfl:    polyvinyl alcohol (LIQUIFILM TEARS) 1.4 % ophthalmic solution, Place 1 drop into both eyes 3 (three) times daily as needed for dry eyes., Disp: , Rfl:    potassium chloride  (KLOR-CON ) 20 MEQ packet, Take 20 mEq by mouth 2 (two) times daily., Disp: 30 packet, Rfl: 0   promethazine  (PHENERGAN ) 12.5 MG tablet, Take 12.5 mg by mouth every 8 (eight) hours as needed., Disp: , Rfl:    rizatriptan (MAXALT-MLT) 10 MG disintegrating tablet, Take 10 mg by mouth as needed for migraine. May repeat in 2 hours if needed, Disp: , Rfl:    silodosin (RAPAFLO) 8 MG CAPS capsule, Take 8 mg by mouth at bedtime. , Disp: , Rfl:    sucralfate  (CARAFATE ) 1 GM/10ML suspension, Take 10 mLs (1 g total) by mouth 4 (four) times daily., Disp: 420 mL, Rfl: 1   tiZANidine (ZANAFLEX) 2 MG tablet, Take 2 mg by mouth 3 (three) times daily as needed., Disp: , Rfl:    topiramate  (TOPAMAX ) 100 MG tablet, Take 1 tablet (100 mg total) by mouth 2 (two) times daily., Disp: 60 tablet, Rfl: 5   traMADol  (ULTRAM ) 50 MG tablet, Take 2 tablets (100 mg total) by mouth every 8 (eight) hours as needed for moderate pain (pain score 4-6)., Disp: 180 tablet, Rfl: 2   Travoprost, BAK Free, (TRAVATAN) 0.004 % SOLN ophthalmic solution, Place 1 drop into both eyes at bedtime. , Disp: , Rfl:    Past Medical History:  Diagnosis Date   Anemia    Anxiety    Arthritis    all over (04/19/2013)   Asthma    Bleeding stomach ulcer 02/08/1979   Bloating    Chronic back pain    Chronic headaches    Chronic  heartburn    Chronic neck pain    Chronic pain    Colon cancer (HCC)    DDD (degenerative disc disease), lumbar    Diabetic peripheral neuropathy (HCC)    in my feet (04/19/2013)   Dizziness    Dysphagia    Family history of anesthesia complication    daughter has PONV too (04/19/2013)   Fibromyalgia    Gastroesophageal reflux    Gastroparesis    Glaucoma, bilateral    Hiatal hernia    had it before; had OR; got it again (04/19/2013)   History of blood transfusion 02/08/1979   w/bleeding stomach ulcer (  04/19/2013)   History of kidney stones    Migraine    take RX for it qd (04/19/2013)   Nausea    Pneumonia 04/09/2012   PONV (postoperative nausea and vomiting)    Renal insufficiency    TIA (transient ischemic attack)    3-4 before starting RX; none since (04/19/2013)   Type II diabetes mellitus (HCC)    Walking pneumonia ~ 1966   Assessment and Plan:  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 Occipital neuralgia of left side   3. Chronic bilateral low back pain without sciatica   4. Cervical myofascial pain syndrome   5. Fibromyalgia   6. Cervicalgia   7. Cervical post-laminectomy syndrome   8. Chronic pain syndrome   9. Chronic, continuous use of opioids   10. Otalgia, unspecified laterality    Based on conversation today I think is appropriate to continue with the tramadol  for her chronic pain headaches and neck pain as well as lower extremity pain and spinal stenosis back pain.  This has worked well for her.  No side effects reported.  I have reviewed the [facility]  practitioner database information.  Will defer on any repeat greater cervical nerve blocks at this time however we will schedule her for that in about 2 months.  She does receive these about every 3 to 4 months for the chronic ongoing tension component headaches.  Additionally, she has asked for referral to ENT to evaluate for otalgia for chronic ear pain and going  to refer her to Dr. Herminio for evaluation.  Will schedule her for return as noted and continue follow-up with her primary care physicians for baseline medical care. Follow Up Instructions:    I discussed the assessment and treatment plan with the patient. The patient was provided an opportunity to ask questions and all were answered. The patient agreed with the plan and demonstrated an understanding of the instructions.   The patient was advised to call back or seek an in-person evaluation if the symptoms worsen or if the condition fails to improve as anticipated.  I provided 30 minutes of non-face-to-face time during this encounter.   Lynwood KANDICE Clause, MD

## 2024-03-10 ENCOUNTER — Encounter (INDEPENDENT_AMBULATORY_CARE_PROVIDER_SITE_OTHER): Payer: Self-pay | Admitting: Gastroenterology

## 2024-03-10 ENCOUNTER — Ambulatory Visit (INDEPENDENT_AMBULATORY_CARE_PROVIDER_SITE_OTHER): Admitting: Gastroenterology

## 2024-03-10 VITALS — BP 129/74 | HR 77 | Temp 97.8°F | Ht 59.0 in | Wt 136.0 lb

## 2024-03-10 DIAGNOSIS — R1310 Dysphagia, unspecified: Secondary | ICD-10-CM | POA: Diagnosis not present

## 2024-03-10 DIAGNOSIS — K581 Irritable bowel syndrome with constipation: Secondary | ICD-10-CM

## 2024-03-10 NOTE — Patient Instructions (Addendum)
 Try to minimize use or Reglan  10 mg, take as needed max of 3 times a day Cut food in small pieces and chew food thoroughly to avoid choking episodes. If worsening issues with swallowing, may consider placement of feeding tube

## 2024-03-10 NOTE — Progress Notes (Unsigned)
 Toribio Fortune, M.D. Gastroenterology & Hepatology [facility]enter[facility] [address] [place], [place] [address]  Primary Care Physician: Donal Channing SQUIBB, FNP [address] [place] [address]  I will communicate my assessment and recommendations to the referring MD via EMR.  Problems: Recurrent dysphagia Chronic heartburn History of gastroparesis History of achalasia status post Heller myotomy  History of Present Illness: [name] is a 83 y.o. female with past medical history of  anxiety, diabetes, diabetic gastroparesis, fibromyalgia, chronic idiopathic constipation, GERD, TIA, achalasia status post Heller myotomy, history of colorectal cancer status post resection in 2010, chronic opiate use,  who presents for follow up of dysphagia.  The patient was last seen on 01/14/2024. At that time, the patient was continued on Dexilant  60 mg daily and started on famotidine  at night 40 mg.  Also continue on Linzess  72 mcg daily.  Trial of Reglan  10 mg 3 times daily was started.  States that she is still having issues with swallowing food or pills on a regular basis.  This is not happening with every swallow but is happening fairly often during the day. States that she is also having dysphagia to liquids. States her PCP prescribed Creon with meals and snacks , which her daughter states maybe has helped decreasing the vomiting and dysphagia episodes, although she is not sure. States sometime she has regurgiitation despite taking Dexilant  compliantly.  No severe heartburn but reports feeling significant discomfort in her chest.  Has 1-2 runny Bms per day.  A week and a half ago she was taken off hydrocodone .  She is now only on tramadol  from an opiate perspective.  The patient denies having any nausea, vomiting, fever, chills, hematochezia, melena, hematemesis, abdominal distention, abdominal pain, diarrhea, jaundice, pruritus or weight  loss.  Patient had initial manometry that was suggestive of recurrent achalasia but most recent EGD with Endoflip performed at [facility] showed increased distensibility which ruled out achalasia as the factors driving her symptoms.  The patient was told she was not a candidate for POEM and she reports she was told that her esophagus was too flimsy and not contracting a tissue, so she will not be the best candidate for any intervention, especially given her age she will be a poor surgical candidate.  GES 2015: gastroparesis  had EGD with pyloric botox in 2015 for her gastroparesis by Dr. Gregory with improvement in her nausea at that time.This was done again in 2016 without significant improvement and again in 2017 with no improvement   esophageal manometry and pH impedance testing: 04/2023  changes consistent with type III achalasia with persistently elevated IRP.  Last EGD: Patient seen by [facility] (Dr. Francois) on 4/9 with plans for POEM which was performed in june however underwent EGD with FLIP (POEM not completed) on 11/12/23: - LA Grade A esophagitis with no bleeding. - 2 cm hiatal hernia. - A small amount of food (residue) in the stomach. - Normal examined duodenum.  - FLIP imaging performed. Consistent with increased distensibility of the esophago-gastric junction. - No specimens collected. Recommendation:  - Use Nexium (esomeprazole) 40 mg PO BID.                        - Use Pepcid  (famotidine ) 40 mg PO at bedtime.  Flex sig: 06/2023- Hemorrhoids found on perianal exam.                           -  Rectocele found on perianal exam.                           - One 3 mm polyp in the sigmoid colon, removed with                            a cold snare. Resected and retrieved.                           - Non-bleeding internal hemorrhoids. (Hyperplastic polyp)  Past Medical History: Past Medical History:  Diagnosis Date   Anemia    Anxiety    Arthritis    all over (04/19/2013)   Asthma     Bleeding stomach ulcer 02/08/1979   Bloating    Chronic back pain    Chronic headaches    Chronic heartburn    Chronic neck pain    Chronic pain    Colon cancer (HCC)    DDD (degenerative disc disease), lumbar    Diabetic peripheral neuropathy (HCC)    in my feet (04/19/2013)   Dizziness    Dysphagia    Family history of anesthesia complication    daughter has PONV too (04/19/2013)   Fibromyalgia    Gastroesophageal reflux    Gastroparesis    Glaucoma, bilateral    Hiatal hernia    had it before; had OR; got it again (04/19/2013)   History of blood transfusion 02/08/1979   w/bleeding stomach ulcer (04/19/2013)   History of kidney stones    Migraine    take RX for it qd (04/19/2013)   Nausea    Pneumonia 04/09/2012   PONV (postoperative nausea and vomiting)    Renal insufficiency    TIA (transient ischemic attack)    3-4 before starting RX; none since (04/19/2013)   Type II diabetes mellitus (HCC)    Walking pneumonia ~ 1966    Past Surgical History: Past Surgical History:  Procedure Laterality Date   ABDOMINAL ADHESION SURGERY  ~ 2012   repaired wrap where they did hiatal hernia OR too 911/04/2013)   ANTERIOR CERVICAL DISCECTOMY  ~ 2009   only cleaned out arthritis and spurs (04/19/2013)   BIOPSY  01/09/2021   Procedure: BIOPSY;  Surgeon: Golda Claudis PENNER, MD;  [facility];  Service: Endoscopy;;  antrum; distal esophagus;   CATARACT EXTRACTION W/ INTRAOCULAR LENS IMPLANT Left 04/13/2013   CHOLECYSTECTOMY  1980's   COLON SURGERY     COLONOSCOPY  10/11/2010   COLONOSCOPY  11/23/2009   COLONOSCOPY  07/28/2008   W/SNARE   COLONOSCOPY  06/28/07   COLONOSCOPY  05/10/07   W/POLYP   COLONOSCOPY  12/28/00   COLONOSCOPY N/A 06/29/2015   Procedure: COLONOSCOPY;  Surgeon: Claudis PENNER Golda, MD;  [facility]: [facility];  Service: Endoscopy;  Laterality: N/A;  135   COLONOSCOPY WITH ESOPHAGOGASTRODUODENOSCOPY (EGD) N/A 05/13/2013   Procedure:  COLONOSCOPY WITH ESOPHAGOGASTRODUODENOSCOPY (EGD);  Surgeon: Claudis PENNER Golda, MD;  [facility];  Service: Endoscopy;  Laterality: N/A;  855   COLONOSCOPY WITH PROPOFOL  N/A 01/09/2021   Procedure: COLONOSCOPY WITH PROPOFOL ;  Surgeon: Golda Claudis PENNER, MD;  [facility];  Service: Endoscopy;  Laterality: N/A;  12:50   ESOPHAGOGASTRODUODENOSCOPY (EGD) WITH PROPOFOL  N/A 10/06/2018   Procedure: ESOPHAGOGASTRODUODENOSCOPY (EGD) WITH PROPOFOL ;  Surgeon: Golda Claudis PENNER, MD;  [facility];  Service:  Endoscopy;  Laterality: N/A;   ESOPHAGOGASTRODUODENOSCOPY (EGD) WITH PROPOFOL  N/A 01/09/2021   Procedure: ESOPHAGOGASTRODUODENOSCOPY (EGD) WITH PROPOFOL ;  Surgeon: Golda Claudis PENNER, MD;  [facility];  Service: Endoscopy;  Laterality: N/A;   FLEXIBLE SIGMOIDOSCOPY N/A 06/23/2023   Procedure: FLEXIBLE SIGMOIDOSCOPY;  Surgeon: Eartha Angelia Sieving, MD;  [facility];  Service: Gastroenterology;  Laterality: N/A;  9:30AM;ASA 3   HEMICOLECTOMY  2010   ZIEGLER   HIATAL HERNIA REPAIR  1970's   LEFT HEART CATHETERIZATION WITH CORONARY ANGIOGRAM N/A 04/19/2013   Procedure: LEFT HEART CATHETERIZATION WITH CORONARY ANGIOGRAM;  Surgeon: Peter M Swaziland, MD;  Location: [facility] CATH LAB;  Service: Cardiovascular;  Laterality: N/A;   POLYPECTOMY  10/06/2018   Procedure: POLYPECTOMY;  Surgeon: Golda Claudis PENNER, MD;  [facility];  Service: Endoscopy;;  gastric   POLYPECTOMY  01/09/2021   Procedure: POLYPECTOMY;  Surgeon: Golda Claudis PENNER, MD;  [facility];  Service: Endoscopy;;  transverse;splenic flexure   POLYPECTOMY  06/23/2023   Procedure: POLYPECTOMY INTESTINAL;  Surgeon: Eartha Angelia Sieving, MD;  [facility];  Service: Gastroenterology;;   POSTERIOR FUSION CERVICAL SPINE  1985   had a broken neck (04/19/2013)   TONSILLECTOMY  ~ 1953   TOOTH EXTRACTION Bilateral 12/04/2020   Procedure: BILATERAL TEMPOROMANDIBULAR JOINT  ARTHROCENTESIS; DENTAL EXTRACTION TEETH #3,6,7,8,9,10,11,12,19,23,24,25,26,32 WITH ALVEOLOPLASTY;  Surgeon: Sheryle Hamilton, DMD;  Location: [facility];  Service: Oral Surgery;  Laterality: Bilateral;   UPPER GASTROINTESTINAL ENDOSCOPY  10/11/2010   EGD ED   UPPER GASTROINTESTINAL ENDOSCOPY  11/23/2009   UPPER GASTROINTESTINAL ENDOSCOPY  05/10/07   EGD ED   UPPER GASTROINTESTINAL ENDOSCOPY  08/11/01   EGD ED   VAGINAL HYSTERECTOMY      Family History: Family History  Problem Relation Age of Onset   Heart disease Mother    Diabetes Mother    Dementia Father    Healthy Sister    Diabetes Brother    Kidney cancer Brother    Diabetes Brother    Neuropathy Brother    Diabetes Daughter    Diabetes Daughter    Diabetes Son     Social History: Social History   Tobacco Use  Smoking Status Former   Current packs/day: 0.00   Average packs/day: 1.5 packs/day for 15.0 years (22.5 ttl pk-yrs)   Types: Cigarettes   Start date: 03/16/1976   Quit date: 03/17/1991   Years since quitting: 33.0   Passive exposure: Past  Smokeless Tobacco Never  Tobacco Comments   Patient states that it has ben greater than 20 years since she quit smoking   Social History   Substance and Sexual Activity  Alcohol Use No   Alcohol/week: 0.0 standard drinks of alcohol   Social History   Substance and Sexual Activity  Drug Use No    Allergies: Allergies  Allergen Reactions   Botox [Onabotulinumtoxina]     Patient states that it became loose in her stomach , and it caused multiple problems.   Morphine Anaphylaxis and Other (See Comments)    it will kill me made me stop breathing   Aspirin  Other (See Comments)    Gi bleed    Cefuroxime  Axetil Nausea And Vomiting   Codeine Nausea And Vomiting   Diltiazem  Nausea And Vomiting   Dronabinol Nausea And Vomiting and Palpitations   Shellfish Allergy Other (See Comments)    Blood sugar drops   Other     Mold and trees   Ondansetron  Hives  Penicillins Rash    Did it involve swelling of the face/tongue/throat, SOB, or low BP? No Did it involve sudden or severe rash/hives, skin peeling, or any reaction on the inside of your mouth or nose? Yes Did you need to seek medical attention at a hospital or doctor's office? Yes When did it last happen?  Over 10 years  If all above answers are "NO", may proceed with cephalosporin use.     Medications: Current Outpatient Medications  Medication Sig Dispense Refill   acetaminophen  (TYLENOL ) 500 MG tablet Take 500 mg by mouth every 6 (six) hours as needed for mild pain.      ALPRAZolam  (XANAX  XR) 1 MG 24 hr tablet Take 0.5-1 mg by mouth as needed for sleep (Anxiety).      ascorbic acid (VITAMIN C) 500 MG tablet Take 1,000 mg by mouth daily.     cholecalciferol (VITAMIN D3) 10 MCG (400 UNIT) TABS tablet Take 5,000 Units by mouth daily.     cyanocobalamin  (VITAMIN B12) 1000 MCG tablet Take 1,000 mcg by mouth daily.     Dapagliflozin Propanediol (FARXIGA PO) Take 10 mg by mouth daily at 6 (six) AM.     dexlansoprazole  (DEXILANT ) 60 MG capsule Take 1 capsule (60 mg total) by mouth daily. 90 capsule 3   Docusate Sodium  (DSS) 100 MG CAPS Take 100 mg by mouth daily as needed.     Erenumab-aooe (AIMOVIG Weldon) Inject into the skin every 30 (thirty) days.     Evolocumab (REPATHA Mundelein) Inject into the skin. Every two weeks.     famotidine  (PEPCID ) 20 MG tablet Take 20 mg by mouth at bedtime.     fluticasone  (FLONASE ) 50 MCG/ACT nasal spray Place 2 sprays into both nostrils daily.     gabapentin (NEURONTIN) 100 MG capsule Take 2 capsules by mouth 2 (two) times daily.     hydrocortisone  (ANUSOL -HC) 2.5 % rectal cream APPLY 1 APPLICATION RECTALLY TWICE DAILY. (Patient taking differently: Place 1 Application rectally as needed.) 30 g 1   Insulin  Glargine-Lixisenatide (SOLIQUA Sunflower) Inject 15 Units into the skin in the morning.     LINZESS  72 MCG capsule TAKE ONE CAPSULE BY MOUTH ONCE DAILY. (Patient taking  differently: Take 72 mcg by mouth every other day.) 30 capsule 11   lisinopril  (ZESTRIL ) 5 MG tablet Take 5 mg by mouth daily.     loperamide  (IMODIUM ) 2 MG capsule Take 1 capsule (2 mg total) by mouth 2 (two) times daily as needed for diarrhea or loose stools.     metoCLOPramide  (REGLAN ) 10 MG tablet Take 1 tablet (10 mg total) by mouth 3 (three) times daily before meals. 90 tablet 1   Milnacipran HCl (SAVELLA) 100 MG TABS tablet Take 100 mg by mouth 2 (two) times daily.     Multiple Vitamin (MULTIVITAMIN WITH MINERALS) TABS tablet Take 1 tablet by mouth daily.     polyethylene glycol powder (GLYCOLAX /MIRALAX ) 17 GM/SCOOP powder Take 0.5 Containers by mouth daily as needed for mild constipation.     polyvinyl alcohol (LIQUIFILM TEARS) 1.4 % ophthalmic solution Place 1 drop into both eyes 3 (three) times daily as needed for dry eyes.     potassium chloride  (KLOR-CON ) 20 MEQ packet Take 20 mEq by mouth 2 (two) times daily. 30 packet 0   promethazine  (PHENERGAN ) 12.5 MG tablet Take 12.5 mg by mouth every 8 (eight) hours as needed.     rizatriptan (MAXALT-MLT) 10 MG disintegrating tablet Take 10 mg by mouth as needed for  migraine. May repeat in 2 hours if needed     silodosin (RAPAFLO) 8 MG CAPS capsule Take 8 mg by mouth at bedtime.      sucralfate  (CARAFATE ) 1 GM/10ML suspension Take 10 mLs (1 g total) by mouth 4 (four) times daily. 420 mL 1   tiZANidine (ZANAFLEX) 2 MG tablet Take 2 mg by mouth 3 (three) times daily as needed.     topiramate  (TOPAMAX ) 100 MG tablet Take 1 tablet (100 mg total) by mouth 2 (two) times daily. 60 tablet 5   traMADol  (ULTRAM ) 50 MG tablet Take 2 tablets (100 mg total) by mouth every 8 (eight) hours as needed for moderate pain (pain score 4-6). 180 tablet 2   Travoprost, BAK Free, (TRAVATAN) 0.004 % SOLN ophthalmic solution Place 1 drop into both eyes at bedtime.      dicyclomine  (BENTYL ) 10 MG capsule Take 1 capsule (10 mg total) by mouth every 12 (twelve) hours as  needed for spasms (abdominal pain/discomfort). (Patient not taking: Reported on 03/10/2024) 60 capsule 2   No current facility-administered medications for this visit.    Review of Systems: GENERAL: negative for malaise, night sweats HEENT: No changes in hearing or vision, no nose bleeds or other nasal problems. NECK: Negative for lumps, goiter, pain and significant neck swelling RESPIRATORY: Negative for cough, wheezing CARDIOVASCULAR: Negative for chest pain, leg swelling, palpitations, orthopnea GI: SEE HPI MUSCULOSKELETAL: Negative for joint pain or swelling, back pain, and muscle pain. SKIN: Negative for lesions, rash PSYCH: Negative for sleep disturbance, mood disorder and recent psychosocial stressors. HEMATOLOGY Negative for prolonged bleeding, bruising easily, and swollen nodes. ENDOCRINE: Negative for cold or heat intolerance, polyuria, polydipsia and goiter. NEURO: negative for tremor, gait imbalance, syncope and seizures. The remainder of the review of systems is noncontributory.   Physical Exam: BP 129/74 (BP Location: Left Arm, Patient Position: Sitting, Cuff Size: Normal)   Pulse 77   Temp 97.8 F (36.6 C) (Temporal)   Ht 4' 11 (1.499 m)   Wt 136 lb (61.7 kg)   BMI 27.47 kg/m  GENERAL: The patient is AO x3, in no acute distress. HEENT: Head is normocephalic and atraumatic. EOMI are intact. Mouth is well hydrated and without lesions. NECK: Supple. No masses LUNGS: Clear to auscultation. No presence of rhonchi/wheezing/rales. Adequate chest expansion HEART: RRR, normal s1 and s2. ABDOMEN: Soft, nontender, no guarding, no peritoneal signs, and nondistended. BS +. No masses. EXTREMITIES: Without any cyanosis, clubbing, rash, lesions or edema. NEUROLOGIC: AOx3, no focal motor deficit. SKIN: no jaundice, no rashes  Imaging/Labs: as above  I personally reviewed and interpreted the available labs, imaging and endoscopic files.  Impression and Plan: [name]  is a 83 y.o. female with past medical history of  anxiety, diabetes, diabetic gastroparesis, fibromyalgia, chronic idiopathic constipation, GERD, TIA, achalasia status post Heller myotomy, history of colorectal cancer status post resection in 2010, chronic opiate use,  who presents for follow up of dysphagia.  The patient has presented chronic issues with dysphagia for multiple years.  Initially she had some improvement after she underwent Heller myotomy but her symptoms have recurred and affected her life significantly.  She has had extensive investigations as described in this in previous notes.  It was initially thought she had recurrent achalasia based on previous manometry but Endoflip did not show presence of decreased distensibility.  She had a total evaluation at [facility] and was considered she will be a poor candidate for POEM or surgical intervention.  I  discussed this in detail with the patient who understood.  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  Fortunately she has been able to decrease the strength of the medication she takes as she is now off hydrocodone  and only taking tramadol .  She should minimize the intake of this medication.  We discussed that unfortunately there is not too many treatments for her chronic dysphagia condition and if this were to worsen, we shall discuss the possibility of placing a gastrostomy tube.  She is not interested in this for now as she is able to swallow still and has not lost weight.  On the other hand, she has felt some relief of her symptoms while on Reglan , which is expected given her history of diabetic gastroparesis.  I advised her to try to minimize intake of this medication as much as possible.  -Try to minimize use or Reglan  10 mg, take as needed max of 3 times a day -Cut food in small pieces and chew food thoroughly to avoid choking episodes. -If worsening issues with swallowing,  may consider placement of feeding tube  All questions were answered.      Toribio Fortune, MD Gastroenterology and Hepatology [facility] Gastroenterology

## 2024-04-13 ENCOUNTER — Encounter (INDEPENDENT_AMBULATORY_CARE_PROVIDER_SITE_OTHER): Payer: Self-pay | Admitting: Gastroenterology

## 2024-05-11 ENCOUNTER — Encounter: Payer: Self-pay | Admitting: Anesthesiology

## 2024-05-11 ENCOUNTER — Ambulatory Visit: Attending: Anesthesiology | Admitting: Anesthesiology

## 2024-05-11 VITALS — BP 153/64 | HR 63 | Temp 97.9°F | Resp 16 | Ht 59.0 in | Wt 133.0 lb

## 2024-05-11 DIAGNOSIS — M961 Postlaminectomy syndrome, not elsewhere classified: Secondary | ICD-10-CM

## 2024-05-11 DIAGNOSIS — Z79891 Long term (current) use of opiate analgesic: Secondary | ICD-10-CM | POA: Diagnosis not present

## 2024-05-11 DIAGNOSIS — M5481 Occipital neuralgia: Secondary | ICD-10-CM | POA: Diagnosis not present

## 2024-05-11 DIAGNOSIS — M48062 Spinal stenosis, lumbar region with neurogenic claudication: Secondary | ICD-10-CM | POA: Diagnosis not present

## 2024-05-11 DIAGNOSIS — G894 Chronic pain syndrome: Secondary | ICD-10-CM

## 2024-05-11 DIAGNOSIS — M7918 Myalgia, other site: Secondary | ICD-10-CM

## 2024-05-11 DIAGNOSIS — M797 Fibromyalgia: Secondary | ICD-10-CM

## 2024-05-11 DIAGNOSIS — G8929 Other chronic pain: Secondary | ICD-10-CM

## 2024-05-11 DIAGNOSIS — M542 Cervicalgia: Secondary | ICD-10-CM | POA: Diagnosis not present

## 2024-05-11 DIAGNOSIS — H9209 Otalgia, unspecified ear: Secondary | ICD-10-CM

## 2024-05-11 DIAGNOSIS — F1190 Opioid use, unspecified, uncomplicated: Secondary | ICD-10-CM

## 2024-05-11 DIAGNOSIS — Q762 Congenital spondylolisthesis: Secondary | ICD-10-CM

## 2024-05-11 MED ORDER — HYDROCODONE-ACETAMINOPHEN 5-325 MG PO TABS: 1.0000 | ORAL_TABLET | Freq: Every day | ORAL | 0 refills | Status: AC

## 2024-05-11 MED ORDER — TRAMADOL HCL 50 MG PO TABS: 100.0000 mg | ORAL_TABLET | Freq: Three times a day (TID) | ORAL | 1 refills | Status: AC

## 2024-05-11 NOTE — Patient Instructions (Signed)

## 2024-05-11 NOTE — Progress Notes (Signed)
 Safety precautions to be maintained throughout the outpatient stay will include: orient to surroundings, keep bed in low position, maintain call bell within reach at all times, provide assistance with transfer out of bed and ambulation.

## 2024-05-16 LAB — TOXASSURE SELECT 13 (MW), URINE

## 2024-05-26 NOTE — Progress Notes (Signed)
 Subjective:  Patient ID: [name], female    DOB: 08/22/1940  Age: 83 y.o. MRN: [id number]  CC: Neck Pain   Procedure: None  HPI [name] presents for reevaluation.  She continues to complain of ongoing low back thoracic back pain and persistent posterior neck pain.  The quality characteristic and distribution of this has been stable without recent change.  She is also complaining of some ongoing ear pain that has been present for about a month.  In regards to her posterior neck pain and headaches these have been stable in nature but continued to be chronic ongoing problems for her.  She takes her tramadol  averaging about 2 tablets 3 times daily but still has significant breakthrough pain different times during the afternoon and evening.  Otherwise she is in her usual state of health.  The quality characteristic and distribution of the headache pain that she has has been stable.  Outpatient Medications Prior to Visit  Medication Sig Dispense Refill   acetaminophen  (TYLENOL ) 500 MG tablet Take 500 mg by mouth every 6 (six) hours as needed for mild pain.      ALPRAZolam  (XANAX  XR) 1 MG 24 hr tablet Take 0.5-1 mg by mouth as needed for sleep (Anxiety).      ascorbic acid (VITAMIN C) 500 MG tablet Take 1,000 mg by mouth daily.     cholecalciferol (VITAMIN D3) 10 MCG (400 UNIT) TABS tablet Take 5,000 Units by mouth daily.     cyanocobalamin  (VITAMIN B12) 1000 MCG tablet Take 1,000 mcg by mouth daily.     Dapagliflozin Propanediol (FARXIGA PO) Take 10 mg by mouth daily at 6 (six) AM.     dexlansoprazole  (DEXILANT ) 60 MG capsule Take 1 capsule (60 mg total) by mouth daily. 90 capsule 3   dicyclomine  (BENTYL ) 10 MG capsule Take 1 capsule (10 mg total) by mouth every 12 (twelve) hours as needed for spasms (abdominal pain/discomfort). 60 capsule 2   Docusate Sodium  (DSS) 100 MG CAPS Take 100 mg by mouth daily as needed.     Erenumab-aooe (AIMOVIG [place]) Inject into the skin every 30  (thirty) days.     Evolocumab (REPATHA [place]) Inject into the skin. Every two weeks.     famotidine  (PEPCID ) 20 MG tablet Take 20 mg by mouth at bedtime.     fluticasone  (FLONASE ) 50 MCG/ACT nasal spray Place 2 sprays into both nostrils daily.     gabapentin (NEURONTIN) 100 MG capsule Take 2 capsules by mouth 2 (two) times daily.     hydrocortisone  (ANUSOL -HC) 2.5 % rectal cream APPLY 1 APPLICATION RECTALLY TWICE DAILY. (Patient taking differently: Place 1 Application rectally as needed.) 30 g 1   Insulin  Glargine-Lixisenatide (SOLIQUA [place]) Inject 15 Units into the skin in the morning.     LINZESS  72 MCG capsule TAKE ONE CAPSULE BY MOUTH ONCE DAILY. (Patient taking differently: Take 72 mcg by mouth every other day.) 30 capsule 11   lisinopril  (ZESTRIL ) 5 MG tablet Take 5 mg by mouth daily.     loperamide  (IMODIUM ) 2 MG capsule Take 1 capsule (2 mg total) by mouth 2 (two) times daily as needed for diarrhea or loose stools.     metoCLOPramide  (REGLAN ) 10 MG tablet Take 1 tablet (10 mg total) by mouth 3 (three) times daily before meals. 90 tablet 1   Milnacipran HCl (SAVELLA) 100 MG TABS tablet Take 100 mg by mouth 2 (two) times daily.     Multiple Vitamin (MULTIVITAMIN WITH MINERALS) TABS  tablet Take 1 tablet by mouth daily.     polyethylene glycol powder (GLYCOLAX /MIRALAX ) 17 GM/SCOOP powder Take 0.5 Containers by mouth daily as needed for mild constipation.     polyvinyl alcohol (LIQUIFILM TEARS) 1.4 % ophthalmic solution Place 1 drop into both eyes 3 (three) times daily as needed for dry eyes.     potassium chloride  (KLOR-CON ) 20 MEQ packet Take 20 mEq by mouth 2 (two) times daily. 30 packet 0   promethazine  (PHENERGAN ) 12.5 MG tablet Take 12.5 mg by mouth every 8 (eight) hours as needed.     rizatriptan (MAXALT-MLT) 10 MG disintegrating tablet Take 10 mg by mouth as needed for migraine. May repeat in 2 hours if needed     silodosin (RAPAFLO) 8 MG CAPS capsule Take 8 mg by mouth at bedtime.       sucralfate  (CARAFATE ) 1 GM/10ML suspension Take 10 mLs (1 g total) by mouth 4 (four) times daily. 420 mL 1   tiZANidine (ZANAFLEX) 2 MG tablet Take 2 mg by mouth 3 (three) times daily as needed.     topiramate  (TOPAMAX ) 100 MG tablet Take 1 tablet (100 mg total) by mouth 2 (two) times daily. 60 tablet 5   Travoprost, BAK Free, (TRAVATAN) 0.004 % SOLN ophthalmic solution Place 1 drop into both eyes at bedtime.      No facility-administered medications prior to visit.    Review of Systems CNS: No confusion or sedation Cardiac: No angina or palpitations GI: No abdominal pain or constipation Constitutional: No nausea vomiting fevers or chills  Objective:  BP (!) 153/64   Pulse 63   Temp 97.9 F (36.6 C)   Resp 16   Ht 4' 11 (1.499 m)   Wt 133 lb (60.3 kg)   SpO2 97%   BMI 26.86 kg/m    BP Readings from Last 3 Encounters:  05/11/24 (!) 153/64  03/10/24 129/74  02/25/24 (!) 153/81     Wt Readings from Last 3 Encounters:  05/11/24 133 lb (60.3 kg)  03/10/24 136 lb (61.7 kg)  02/25/24 133 lb 13.1 oz (60.7 kg)     Physical Exam Pt is alert and oriented PERRL EOMI HEART IS RRR no murmur or rub LCTA no wheezing or rales MUSCULOSKELETAL reveals tenderness over the greater sacral notch and posterior cervical region and the splenius capitis muscle.  Upper extremity strength and range of motion is at baseline.  Labs  No results found for: HGBA1C Lab Results  Component Value Date   CREATININE 0.93 02/25/2024    -------------------------------------------------------------------------------------------------------------------- Lab Results  Component Value Date   WBC 9.0 02/25/2024   HGB 13.9 02/25/2024   HCT 45.4 02/25/2024   PLT 274 02/25/2024   GLUCOSE 219 (H) 02/25/2024   ALT 17 02/25/2024   AST 24 02/25/2024   NA 140 02/25/2024   K 3.0 (L) 02/25/2024   CL 103 02/25/2024   CREATININE 0.93 02/25/2024   BUN 10 02/25/2024   CO2 19 (L) 02/25/2024   TSH 1.31  12/25/2015   INR 1.03 04/19/2013    --------------------------------------------------------------------------------------------------------------------- No results found.   Assessment & Plan:   [name] was seen today for neck pain.  Diagnoses and all orders for this visit:  Spinal stenosis of lumbar region with neurogenic claudication  Occipital neuralgia of left side  Chronic bilateral low back pain without sciatica  Chronic pain syndrome -     ToxASSURE [place] 13 (MW), Urine  Chronic, continuous use of opioids -     ToxASSURE Select  13 (MW), Urine  Cervical post-laminectomy syndrome  Cervical myofascial pain syndrome  Fibromyalgia  Cervicalgia  Otalgia, unspecified laterality -     Consult to ear nose and throat  SPONDYLOLITHESIS  Other orders -     HYDROcodone -acetaminophen  (NORCO/VICODIN) 5-325 MG tablet; Take 1 tablet by mouth at bedtime. -     traMADol  (ULTRAM ) 50 MG tablet; Take 2 tablets (100 mg total) by mouth 3 (three) times daily. Take 1 or 2 tablets at  9am, 1pm and  5pm        ----------------------------------------------------------------------------------------------------------------------  Problem List Items Addressed This Visit       Unprioritized   Cervical post-laminectomy syndrome   Chronic low back pain   Relevant Medications   HYDROcodone -acetaminophen  (NORCO/VICODIN) 5-325 MG tablet   traMADol  (ULTRAM ) 50 MG tablet   Chronic pain syndrome   Relevant Medications   HYDROcodone -acetaminophen  (NORCO/VICODIN) 5-325 MG tablet   traMADol  (ULTRAM ) 50 MG tablet   Other Relevant Orders   ToxASSURE Select 13 (MW), Urine (Completed)   Fibromyalgia   Relevant Medications   HYDROcodone -acetaminophen  (NORCO/VICODIN) 5-325 MG tablet   traMADol  (ULTRAM ) 50 MG tablet   Spinal stenosis of lumbar region - Primary   SPONDYLOLITHESIS   Other Visit Diagnoses       Occipital neuralgia of left side       Relevant Medications    HYDROcodone -acetaminophen  (NORCO/VICODIN) 5-325 MG tablet   traMADol  (ULTRAM ) 50 MG tablet     Chronic, continuous use of opioids       Relevant Orders   ToxASSURE Select 13 (MW), Urine (Completed)     Cervical myofascial pain syndrome       Relevant Medications   HYDROcodone -acetaminophen  (NORCO/VICODIN) 5-325 MG tablet   traMADol  (ULTRAM ) 50 MG tablet     Cervicalgia         Otalgia, unspecified laterality       Relevant Orders   Consult to ear nose and throat         ----------------------------------------------------------------------------------------------------------------------  1. Spinal stenosis of lumbar region with neurogenic claudication (Primary) Continue core stretching as she is tolerating.  Continue daily walking.  2. Occipital neuralgia of left side Will defer any repeat injections at this point.  3. Chronic bilateral low back pain without sciatica As above  4. Chronic pain syndrome Continue current medication with tramadol  2 tablets in the morning afternoon and evening.  I would not start her on hydrocodone  5 mg at bedtime to see if this helps with some of the pain that she is experiencing late at night and especially overnight.  She has been on this regimen in the past and done well with this.  No side effects reported with opioids. - ToxASSURE Select 13 (MW), Urine  5. Chronic, continuous use of opioids As above - ToxASSURE Select 13 (MW), Urine  6. Cervical post-laminectomy syndrome As above and continue current medication management for this.  7. Cervical myofascial pain syndrome   8. Fibromyalgia   9. Cervicalgia   10. Otalgia, unspecified laterality I have informed her that we cannot treat otitis here in the pain clinic and I am subsequently referring her for an ENT consultation.  She is compliant with this. - Consult to ear nose and throat  11. SPONDYLOLITHESIS As  above    ----------------------------------------------------------------------------------------------------------------------  I am having [name] start on HYDROcodone -acetaminophen  and traMADol . I am also having her maintain her ALPRAZolam , acetaminophen , silodosin, topiramate , Travoprost (BAK Free), multivitamin with minerals, artificial tears, lisinopril , hydrocortisone , loperamide , Dapagliflozin Propanediol (  FARXIGA PO), dicyclomine , Erenumab-aooe (AIMOVIG [place]), Evolocumab (REPATHA [place]), Insulin  Glargine-Lixisenatide (SOLIQUA [place]), gabapentin, rizatriptan, cyanocobalamin , cholecalciferol, Milnacipran HCl, DSS, ascorbic acid, fluticasone , polyethylene glycol powder, promethazine , tiZANidine, Linzess , dexlansoprazole , metoCLOPramide , sucralfate , potassium chloride , and famotidine .   Meds ordered this encounter  Medications   HYDROcodone -acetaminophen  (NORCO/VICODIN) 5-325 MG tablet    Sig: Take 1 tablet by mouth at bedtime.    Dispense:  30 tablet    Refill:  0   traMADol  (ULTRAM ) 50 MG tablet    Sig: Take 2 tablets (100 mg total) by mouth 3 (three) times daily. Take 1 or 2 tablets at  9am, 1pm and  5pm    Dispense:  180 tablet    Refill:  1   Patient's Medications  New Prescriptions   HYDROCODONE -ACETAMINOPHEN  (NORCO/VICODIN) 5-325 MG TABLET    Take 1 tablet by mouth at bedtime.   TRAMADOL  (ULTRAM ) 50 MG TABLET    Take 2 tablets (100 mg total) by mouth 3 (three) times daily. Take 1 or 2 tablets at  9am, 1pm and  5pm  Previous Medications   ACETAMINOPHEN  (TYLENOL ) 500 MG TABLET    Take 500 mg by mouth every 6 (six) hours as needed for mild pain.    ALPRAZOLAM  (XANAX  XR) 1 MG 24 HR TABLET    Take 0.5-1 mg by mouth as needed for sleep (Anxiety).    ASCORBIC ACID (VITAMIN C) 500 MG TABLET    Take 1,000 mg by mouth daily.   CHOLECALCIFEROL (VITAMIN D3) 10 MCG (400 UNIT) TABS TABLET    Take 5,000 Units by mouth daily.   CYANOCOBALAMIN  (VITAMIN B12) 1000 MCG TABLET    Take 1,000 mcg  by mouth daily.   DAPAGLIFLOZIN PROPANEDIOL (FARXIGA PO)    Take 10 mg by mouth daily at 6 (six) AM.   DEXLANSOPRAZOLE  (DEXILANT ) 60 MG CAPSULE    Take 1 capsule (60 mg total) by mouth daily.   DICYCLOMINE  (BENTYL ) 10 MG CAPSULE    Take 1 capsule (10 mg total) by mouth every 12 (twelve) hours as needed for spasms (abdominal pain/discomfort).   DOCUSATE SODIUM  (DSS) 100 MG CAPS    Take 100 mg by mouth daily as needed.   ERENUMAB-AOOE (AIMOVIG [place])    Inject into the skin every 30 (thirty) days.   EVOLOCUMAB (REPATHA [place])    Inject into the skin. Every two weeks.   FAMOTIDINE  (PEPCID ) 20 MG TABLET    Take 20 mg by mouth at bedtime.   FLUTICASONE  (FLONASE ) 50 MCG/ACT NASAL SPRAY    Place 2 sprays into both nostrils daily.   GABAPENTIN (NEURONTIN) 100 MG CAPSULE    Take 2 capsules by mouth 2 (two) times daily.   HYDROCORTISONE  (ANUSOL -HC) 2.5 % RECTAL CREAM    APPLY 1 APPLICATION RECTALLY TWICE DAILY.   INSULIN  GLARGINE-LIXISENATIDE (SOLIQUA [place])    Inject 15 Units into the skin in the morning.   LINZESS  72 MCG CAPSULE    TAKE ONE CAPSULE BY MOUTH ONCE DAILY.   LISINOPRIL  (ZESTRIL ) 5 MG TABLET    Take 5 mg by mouth daily.   LOPERAMIDE  (IMODIUM ) 2 MG CAPSULE    Take 1 capsule (2 mg total) by mouth 2 (two) times daily as needed for diarrhea or loose stools.   METOCLOPRAMIDE  (REGLAN ) 10 MG TABLET    Take 1 tablet (10 mg total) by mouth 3 (three) times daily before meals.   MILNACIPRAN HCL (SAVELLA) 100 MG TABS TABLET    Take 100 mg by mouth 2 (two) times daily.  MULTIPLE VITAMIN (MULTIVITAMIN WITH MINERALS) TABS TABLET    Take 1 tablet by mouth daily.   POLYETHYLENE GLYCOL POWDER (GLYCOLAX /MIRALAX ) 17 GM/SCOOP POWDER    Take 0.5 Containers by mouth daily as needed for mild constipation.   POLYVINYL ALCOHOL (LIQUIFILM TEARS) 1.4 % OPHTHALMIC SOLUTION    Place 1 drop into both eyes 3 (three) times daily as needed for dry eyes.   POTASSIUM CHLORIDE  (KLOR-CON ) 20 MEQ PACKET    Take 20 mEq by mouth 2 (two)  times daily.   PROMETHAZINE  (PHENERGAN ) 12.5 MG TABLET    Take 12.5 mg by mouth every 8 (eight) hours as needed.   RIZATRIPTAN (MAXALT-MLT) 10 MG DISINTEGRATING TABLET    Take 10 mg by mouth as needed for migraine. May repeat in 2 hours if needed   SILODOSIN (RAPAFLO) 8 MG CAPS CAPSULE    Take 8 mg by mouth at bedtime.    SUCRALFATE  (CARAFATE ) 1 GM/10ML SUSPENSION    Take 10 mLs (1 g total) by mouth 4 (four) times daily.   TIZANIDINE (ZANAFLEX) 2 MG TABLET    Take 2 mg by mouth 3 (three) times daily as needed.   TOPIRAMATE  (TOPAMAX ) 100 MG TABLET    Take 1 tablet (100 mg total) by mouth 2 (two) times daily.   TRAVOPROST, BAK FREE, (TRAVATAN) 0.004 % SOLN OPHTHALMIC SOLUTION    Place 1 drop into both eyes at bedtime.   Modified Medications   No medications on file  Discontinued Medications   No medications on file   ----------------------------------------------------------------------------------------------------------------------  Follow-up: Return in about 2 months (around 07/12/2024) for evaluation, med refill.  Continue follow-up with her primary care physicians for baseline medical care.  Lynwood KANDICE Clause, MD

## 2024-09-01 ENCOUNTER — Ambulatory Visit: Admitting: Gastroenterology
